# Patient Record
Sex: Male | Born: 1950
Health system: Southern US, Community
[De-identification: ages and names within clinical notes are randomized; demographics above are authoritative.]

## PROBLEM LIST (undated history)

## (undated) DIAGNOSIS — E039 Hypothyroidism, unspecified: Secondary | ICD-10-CM

## (undated) DIAGNOSIS — I472 Ventricular tachycardia, unspecified: Secondary | ICD-10-CM

## (undated) DIAGNOSIS — I255 Ischemic cardiomyopathy: Secondary | ICD-10-CM

## (undated) DIAGNOSIS — E785 Hyperlipidemia, unspecified: Secondary | ICD-10-CM

## (undated) DIAGNOSIS — D75839 Thrombocytosis, unspecified: Secondary | ICD-10-CM

## (undated) DIAGNOSIS — I10 Essential (primary) hypertension: Secondary | ICD-10-CM

## (undated) DIAGNOSIS — I714 Abdominal aortic aneurysm, without rupture, unspecified: Secondary | ICD-10-CM

## (undated) DIAGNOSIS — I5022 Chronic systolic (congestive) heart failure: Secondary | ICD-10-CM

## (undated) DIAGNOSIS — Z95810 Presence of automatic (implantable) cardiac defibrillator: Secondary | ICD-10-CM

## (undated) DIAGNOSIS — D473 Essential (hemorrhagic) thrombocythemia: Principal | ICD-10-CM

## (undated) DIAGNOSIS — Z7901 Long term (current) use of anticoagulants: Secondary | ICD-10-CM

## (undated) DIAGNOSIS — Z954 Presence of other heart-valve replacement: Secondary | ICD-10-CM

## (undated) DIAGNOSIS — Z950 Presence of cardiac pacemaker: Secondary | ICD-10-CM

## (undated) DIAGNOSIS — I2510 Atherosclerotic heart disease of native coronary artery without angina pectoris: Secondary | ICD-10-CM

## (undated) DIAGNOSIS — M79605 Pain in left leg: Principal | ICD-10-CM

## (undated) HISTORY — PX: KIDNEY SURGERY: SHX687

## (undated) HISTORY — DX: Pain in left leg: M79.605

## (undated) HISTORY — PX: TONSILLECTOMY: SUR1361

## (undated) HISTORY — PX: CHOLECYSTECTOMY: SHX55

## (undated) HISTORY — DX: Essential (primary) hypertension: I10

## (undated) HISTORY — PX: MECHANICAL AORTIC VALVE REPLACEMENT: SHX2013

## (undated) HISTORY — DX: Atherosclerotic heart disease of native coronary artery without angina pectoris: I25.10

## (undated) HISTORY — DX: Ventricular tachycardia: I47.2

## (undated) HISTORY — DX: Hyperlipidemia, unspecified: E78.5

## (undated) HISTORY — DX: Ventricular tachycardia, unspecified: I47.20

## (undated) HISTORY — DX: Essential (hemorrhagic) thrombocythemia: D47.3

## (undated) HISTORY — DX: Ischemic cardiomyopathy: I25.5

## (undated) HISTORY — DX: Long term (current) use of anticoagulants: Z79.01

## (undated) HISTORY — PX: BIV ICD INSERTION CRT-D: EP1195

## (undated) HISTORY — DX: Chronic systolic (congestive) heart failure: I50.22

## (undated) HISTORY — DX: Presence of other heart-valve replacement: Z95.4

---

## 1997-11-11 ENCOUNTER — Ambulatory Visit (HOSPITAL_COMMUNITY): Admission: RE | Admit: 1997-11-11 | Discharge: 1997-11-11 | Payer: Self-pay | Admitting: Neurosurgery

## 2015-05-30 HISTORY — PX: CORONARY ARTERY BYPASS GRAFT: SHX141

## 2016-06-02 HISTORY — PX: BIV ICD INSERTION CRT-D: EP1195

## 2017-01-23 ENCOUNTER — Encounter (HOSPITAL_COMMUNITY): Payer: Self-pay | Admitting: Internal Medicine

## 2017-01-23 ENCOUNTER — Ambulatory Visit (HOSPITAL_COMMUNITY)
Admission: RE | Admit: 2017-01-23 | Discharge: 2017-01-23 | Disposition: A | Discharge status: Home / Self Care | Payer: Medicare Other | Source: Ambulatory Visit | Attending: Internal Medicine | Admitting: Internal Medicine

## 2017-01-23 VITALS — BP 113/52 | HR 66 | Wt 173.4 lb

## 2017-01-23 DIAGNOSIS — I2510 Atherosclerotic heart disease of native coronary artery without angina pectoris: Secondary | ICD-10-CM | POA: Diagnosis not present

## 2017-01-23 DIAGNOSIS — Z950 Presence of cardiac pacemaker: Secondary | ICD-10-CM | POA: Insufficient documentation

## 2017-01-23 DIAGNOSIS — Z952 Presence of prosthetic heart valve: Secondary | ICD-10-CM

## 2017-01-23 DIAGNOSIS — I5022 Chronic systolic (congestive) heart failure: Secondary | ICD-10-CM | POA: Diagnosis not present

## 2017-01-23 DIAGNOSIS — R9431 Abnormal electrocardiogram [ECG] [EKG]: Secondary | ICD-10-CM | POA: Insufficient documentation

## 2017-01-23 DIAGNOSIS — I480 Paroxysmal atrial fibrillation: Secondary | ICD-10-CM

## 2017-01-23 LAB — BASIC METABOLIC PANEL
ANION GAP: 10 (ref 5–15)
BUN: 17 mg/dL (ref 6–20)
CHLORIDE: 102 mmol/L (ref 101–111)
CO2: 26 mmol/L (ref 22–32)
Calcium: 9.1 mg/dL (ref 8.9–10.3)
Creatinine, Ser: 1.22 mg/dL (ref 0.61–1.24)
GFR calc Af Amer: >60 mL/min (ref >60)
GFR, EST NON AFRICAN AMERICAN: 60 mL/min — AB (ref >60)
GLUCOSE: 101 mg/dL — AB (ref 65–99)
POTASSIUM: 3.5 mmol/L (ref 3.5–5.1)
Sodium: 138 mmol/L (ref 135–145)

## 2017-01-23 MED ORDER — SPIRONOLACTONE 25 MG PO TABS: 12.5000 mg | ORAL_TABLET | Freq: Every day | ORAL | 3 refills | Status: DC

## 2017-01-23 NOTE — Patient Instructions (Signed)
Start Spironolactone 12.5 mg (1/2 tab) daily  Labs today  Labs in 1 week in [place] have been referred to EP to follow up your device  You have been referred to Dr Beryle Beams  Your physician recommends that you schedule a follow-up appointment in: 4 months with echocardiogram

## 2017-01-23 NOTE — Progress Notes (Signed)
[facility] CLINIC NEW PATIENT NOTE  Primary Cardiologist: Martin Majestic   HPI:  Mr. Carreno is a very pleasant 66 y.o. male with AVR/MVR, CAD s/p CABG, systolic HF and essential thrombosis. Referred for new patient evaluation.   Has h/o congenital AoV abnormality with AI. In 1994 had MI and underwent PCI (unclear artery) and mechanical AVR at [facility].   Has h/o polysubstance abuse with cocaine, ETOH and tobacco abuse. No longer doing cocaine or smoking. Now drinks about 2 beers per day.  Did well from heart perspective until 2017. When he had VF arrest on 05/18/16. Found to have 3v CAD and MV disease and underwent CABG x 3 with mechanical MVR. Post-operatively had AF (treated with amio) and CHB requiring implantation of a BosSci CRT-D device.   Echo on 06/19/16 showed LVEF of 40%.   Since discharge from he hospital he has done well. Gets around ok. 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 The past week has been very fatigued. Also has been having diarrhea for 5 days. No fevers or chills. No ab pain. Started out bloody but no longer bleeding. Saw GI. Colonscopy September 9/4. Denis edema, orthopnea or PND. No CP.  Taking coumadin regularly. Not exercising regularly except for his work.  On hydroxyurea for thrombocytosis   Review of Systems: [y] = yes, [ ]  = no   General: Weight gain [ ] ; Weight loss [ ] ; Anorexia [ ] ; Fatigue Blue.Reese ]; Fever [ ] ; Chills [ ] ; Weakness [ ]   Cardiac: Chest pain/pressure [ ] ; Resting SOB [ ] ; Exertional SOB [ ] ; Orthopnea [ ] ; Pedal Edema [ ] ; Palpitations [ ] ; Syncope [ ] ; Presyncope [ ] ; Paroxysmal nocturnal dyspnea[ ]   Pulmonary: Cough [ ] ; Wheezing[ ] ; Hemoptysis[ ] ; Sputum [ ] ; Snoring [ ]   GI: Vomiting[ ] ; Dysphagia[ ] ; Melena[ ] ; Hematochezia [ ] ; Heartburn[ ] ; Abdominal pain [ ] ; Constipation [ ] ; Diarrhea Blue.Reese ]; BRBPR [ ]   GU: Hematuria[ ] ; Dysuria [ ] ; Nocturia[ ]   Vascular: Pain in legs with walking [ ] ; Pain in feet with lying flat [ ] ; Non-healing sores [ ] ;  Stroke [ ] ; TIA [ ] ; Slurred speech [ ] ;  Neuro: Headaches[ ] ; Vertigo[ ] ; Seizures[ ] ; Paresthesias[ ] ;Blurred vision [ ] ; Diplopia [ ] ; Vision changes [ ]   Ortho/Skin: Arthritis Blue.Reese ]; Joint pain [ y]; Muscle pain [ ] ; Joint swelling [ ] ; Back Pain [ ] ; Rash [ ]   Psych: Depression[ ] ; Anxiety[ ]   Heme: Bleeding problems [ ] ; Clotting disorders [ ] ; Anemia [ ]   Endocrine: Diabetes [ ] ; Thyroid dysfunction[ ]    PMHx: As per HPI  Current Outpatient Prescriptions  Medication Sig Dispense Refill  . aspirin 81 MG tablet Take 1 tablet by mouth.    Marland Kitchen atorvastatin (LIPITOR) 80 MG tablet Take 40 mg by mouth daily.    . carvedilol (COREG) 6.25 MG tablet Take 6.25 mg by mouth 2 (two) times daily as needed.    . hydroxyurea (HYDREA) 500 MG capsule Take 500 mg by mouth every other day.    . levothyroxine (SYNTHROID, LEVOTHROID) 25 MCG tablet Take 50 mcg by mouth daily.    Marland Kitchen losartan (COZAAR) 50 MG tablet 1 tablet daily.    . Melatonin 3 MG TABS Take 1 tablet by mouth at bedtime as needed.    . sildenafil (VIAGRA) 100 MG tablet Take 100 mg by mouth daily as needed for erectile dysfunction.    . torsemide (DEMADEX) 20 MG tablet Take 20 mg  by mouth daily.    Marland Kitchen warfarin (COUMADIN) 5 MG tablet Take 5 mg by mouth once.     No current facility-administered medications for this encounter.     Allergies  Allergen Reactions  . Zithromax [Azithromycin] Rash      Social History   Social History  . Marital status: Married    Spouse name: N/A  . Number of children: N/A  . Years of education: N/A   Occupational History  . Not on file.   Social History Main Topics  . Smoking status: Former Smoker    Packs/day: 1.00    Years: 30.00    Types: Cigarettes    Start date: 05/26/1979    Quit date: 05/18/2016  . Smokeless tobacco: Never Used  . Alcohol use Not on file  . Drug use: Unknown  . Sexual activity: Not on file   Other Topics Concern  . Not on file   Social History Narrative  . No  narrative on file   SocHX: . Smoking status: Former Smoker  Packs/day: 1.00  Years: 33.00  Types: Cigarettes  Quit date: 05/17/2016  . Smokeless tobacco: Never Used  . Alcohol use 8.4 oz/week  14 Cans of beer per week  Comment: 2 cans beer per day  . Drug use: No   FamHX:  Father died from CHF at 81 Mother alive no heart problems H/o colon CA on Moms side 1 sister who is fine   Vitals:   01/23/17 1112  BP: (!) 113/52  Pulse: 66  SpO2: 98%  Weight: 173 lb 6.4 oz (78.7 kg)    PHYSICAL EXAM: General:  Well appearing. No respiratory difficulty HEENT: normal Neck: supple. JVP 5-6. Carotids 2+ bilat; no bruits. No lymphadenopathy or thryomegaly appreciated. Cor: PMI nondisplaced. Mildly irregular  Mechanical s1 and s2 Lungs: clear with mildly decreased breath sounds throughout Abdomen: soft, nontender, nondistended. No hepatosplenomegaly. No bruits or masses. Good bowel sounds. Extremities: no cyanosis, clubbing, rash, edema Neuro: alert & oriented x 3, cranial nerves grossly intact. moves all 4 extremities w/o difficulty. Affect pleasant.  ECG: NSR with v-pacing 67 + PVC   ASSESSMENT & PLAN:  1. Chronic systolic HF - Echo 6/22: EF 40% c/b CHB. S/p BosSci CRT-D - NYHA I. Volume status ok  - Continue torsemide, losartan and carvedilol  - Add spiro 12.5mg  daily - Will repeat echo. If EF < 40% consider switching losartan to [facility] - Arrange local EP f/u  2. CAD s/p CABG - No s/s angina - Continue statin, b-blocker, ASA  3. S/p mechanical AVR/MVR - Continue ASA and coumadin - Reminded him of need for SBE prophylaxis  4. Post-op AF - Resolved. Off amio.  - Remains on warfarin for valves  5. Essential thrombocytosis - Continue hydroxyurea - Refer to heme  Glori Bickers, MD  9:29 PM

## 2017-01-24 ENCOUNTER — Telehealth (HOSPITAL_COMMUNITY): Payer: Self-pay | Admitting: *Deleted

## 2017-01-24 NOTE — Telephone Encounter (Signed)
Patient called wanting to know if it was ok to hold coumadin for 5 days for colonoscopy. Per Dr.Bensimhon he may hold coumadin for 5 days. Pt aware.

## 2017-02-02 ENCOUNTER — Telehealth (HOSPITAL_COMMUNITY): Payer: Self-pay | Admitting: Vascular Surgery

## 2017-02-02 ENCOUNTER — Ambulatory Visit (HOSPITAL_COMMUNITY)
Admission: RE | Admit: 2017-02-02 | Discharge: 2017-02-02 | Disposition: A | Discharge status: Home / Self Care | Payer: Medicare Other | Source: Ambulatory Visit | Attending: Internal Medicine | Admitting: Internal Medicine

## 2017-02-02 ENCOUNTER — Other Ambulatory Visit (HOSPITAL_COMMUNITY): Payer: Self-pay | Admitting: Pharmacist

## 2017-02-02 DIAGNOSIS — I5022 Chronic systolic (congestive) heart failure: Secondary | ICD-10-CM

## 2017-02-02 HISTORY — DX: Chronic systolic (congestive) heart failure: I50.22

## 2017-02-02 LAB — BASIC METABOLIC PANEL
ANION GAP: 8 (ref 5–15)
BUN: 14 mg/dL (ref 6–20)
CALCIUM: 9.2 mg/dL (ref 8.9–10.3)
CHLORIDE: 109 mmol/L (ref 101–111)
CO2: 24 mmol/L (ref 22–32)
CREATININE: 1.12 mg/dL (ref 0.61–1.24)
GFR calc non Af Amer: >60 mL/min (ref >60)
Glucose, Bld: 152 mg/dL — ABNORMAL HIGH (ref 65–99)
Potassium: 3.8 mmol/L (ref 3.5–5.1)
SODIUM: 141 mmol/L (ref 135–145)

## 2017-02-02 MED ORDER — WARFARIN SODIUM 5 MG PO TABS: 5.0000 mg | ORAL_TABLET | Freq: Every day | ORAL | 0 refills | Status: DC

## 2017-02-02 NOTE — Addendum Note (Signed)
Encounter addended by: Harvie Junior, CMA on: 02/02/2017  2:16 PM<BR>    Actions taken: Order list changed, Diagnosis association updated

## 2017-02-02 NOTE — Addendum Note (Signed)
Encounter addended by: Harvie Junior, CMA on: 02/02/2017  2:26 PM<BR>    Actions taken: Order list changed, Diagnosis association updated

## 2017-02-05 ENCOUNTER — Other Ambulatory Visit (HOSPITAL_COMMUNITY): Payer: Self-pay | Admitting: *Deleted

## 2017-02-05 ENCOUNTER — Ambulatory Visit (HOSPITAL_COMMUNITY)
Admission: RE | Admit: 2017-02-05 | Discharge: 2017-02-05 | Disposition: A | Discharge status: Home / Self Care | Payer: Medicare Other | Source: Ambulatory Visit | Attending: Cardiology | Admitting: Cardiology

## 2017-02-05 DIAGNOSIS — I5022 Chronic systolic (congestive) heart failure: Secondary | ICD-10-CM | POA: Insufficient documentation

## 2017-02-05 LAB — CBC
HCT: 33.7 % — ABNORMAL LOW (ref 39.0–52.0)
Hemoglobin: 11 g/dL — ABNORMAL LOW (ref 13.0–17.0)
MCH: 35.6 pg — ABNORMAL HIGH (ref 26.0–34.0)
MCHC: 32.6 g/dL (ref 30.0–36.0)
MCV: 109.1 fL — ABNORMAL HIGH (ref 78.0–100.0)
PLATELETS: 387 10*3/uL (ref 150–400)
RBC: 3.09 MIL/uL — ABNORMAL LOW (ref 4.22–5.81)
RDW: 16 % — AB (ref 11.5–15.5)
WBC: 4.3 10*3/uL (ref 4.0–10.5)

## 2017-02-08 ENCOUNTER — Encounter: Payer: Self-pay | Admitting: Internal Medicine

## 2017-02-08 ENCOUNTER — Ambulatory Visit (INDEPENDENT_AMBULATORY_CARE_PROVIDER_SITE_OTHER): Payer: Medicare Other | Admitting: Internal Medicine

## 2017-02-08 VITALS — BP 140/70 | HR 67 | Ht 69.0 in | Wt 176.0 lb

## 2017-02-08 DIAGNOSIS — Z95810 Presence of automatic (implantable) cardiac defibrillator: Secondary | ICD-10-CM | POA: Diagnosis not present

## 2017-02-08 DIAGNOSIS — I5022 Chronic systolic (congestive) heart failure: Secondary | ICD-10-CM

## 2017-02-08 DIAGNOSIS — I480 Paroxysmal atrial fibrillation: Secondary | ICD-10-CM

## 2017-02-08 DIAGNOSIS — I2510 Atherosclerotic heart disease of native coronary artery without angina pectoris: Secondary | ICD-10-CM

## 2017-02-08 NOTE — Patient Instructions (Addendum)
Medication Instructions:  Your physician recommends that you continue on your current medications as directed. Please refer to the Current Medication list given to you today.   Labwork: None ordered  Testing/Procedures: None ordered  Follow-Up: Remote monitoring is used to monitor your ICD from home. This monitoring reduces the number of office visits required to check your device to one time per year. It allows [place] to keep an eye on the functioning of your device to ensure it is working properly. You are scheduled for a device check from home on 05/10/17. You may send your transmission at any time that day. If you have a wireless device, the transmission will be sent automatically. After your physician reviews your transmission, you will receive a postcard with your next transmission date.  Your physician wants you to follow-up in: 1 year with Dr. Caryl Comes. You will receive a reminder letter in the mail two months in advance. If you don't receive a letter, please call our office to schedule the follow-up appointment.   Any Other Special Instructions Will Be Listed Below (If Applicable).     If you need a refill on your cardiac medications before your next appointment, please call your pharmacy.

## 2017-02-08 NOTE — Progress Notes (Signed)
ELECTROPHYSIOLOGY CONSULT NOTE  Patient ID: Taylor [name], MRN: [id number], DOB/AGE: 67-Jul-1952 67 y.o. Admit date: (Not on file) Date of Consult: 02/08/2017  Primary Physician: Patient, No Pcp Per Primary Cardiologist: Taylor [name] is a 66 y.o. male who is being seen today for the evaluation of ICD at the request of Taylor [name].    HPI Taylor [name] is a 66 y.o. male seen to establish ICD care. He has a history of VF arrest 12/17.  He underwent CABG and aortic valve rereplacement.hx of a prior mechanical aortic valve, not unsurprisingly he ended up with postoperative heart block and underwent CRT-D implantation.  He has a history of congenital aortic malformation with aortic insufficiency.  His a history of ischemic cardiomyopathy with prior MI and prior PCI.  DATE TEST    1/18    Echo   EF 40 %                Date Cr Hgb  1/18 1.4 10.1  9/18 1.12 11              Past Medical History:  Diagnosis Date  . Atherosclerotic heart disease of native coronary artery without angina pectoris   . Chronic systolic heart failure ([place]) 02/02/2017  . Hyperlipidemia   . Hypertension   . Ischemic cardiomyopathy   . Presence of other heart-valve replacement   . Ventricular tachycardia [facility])       Surgical History: History reviewed. No pertinent surgical history.   Home Meds: Prior to Admission medications   Medication Sig Start Date End Date Taking? Authorizing Provider  aspirin 81 MG tablet Take 1 tablet by mouth.   Yes [provider]  atorvastatin (LIPITOR) 80 MG tablet Take 40 mg by mouth daily. 06/03/16 06/03/17 Yes [provider]  carvedilol (COREG) 6.25 MG tablet Take 6.25 mg by mouth 2 (two) times daily as needed.   Yes [provider]  hydroxyurea (HYDREA) 500 MG capsule Take 500 mg by mouth every other day. Alternating with 1000 mg by mouth every other day   Yes [provider]  levothyroxine (SYNTHROID, LEVOTHROID) 25 MCG tablet Take  50 mcg by mouth daily. 01/10/16  Yes [provider]  spironolactone (ALDACTONE) 25 MG tablet Take 0.5 tablets (12.5 mg total) by mouth daily. 01/23/17 04/23/17 Yes [name], Taylor Pascal, MD  triazolam (HALCION) 0.25 MG tablet Take 1 tablet by mouth at bedtime. 01/23/17  Yes [provider]  warfarin (COUMADIN) 5 MG tablet Take 1 tablet (5 mg total) by mouth daily. 02/02/17  Yes [name], Taylor Pascal, MD    Allergies:  Allergies  Allergen Reactions  . Zithromax [Azithromycin] Rash    Social History   Social History  . Marital status: Married    Spouse name: N/A  . Number of children: N/A  . Years of education: N/A   Occupational History  . Not on file.   Social History Main Topics  . Smoking status: Former Smoker    Packs/day: 1.00    Years: 30.00    Types: Cigarettes    Start date: 05/26/1979    Quit date: 05/18/2016  . Smokeless tobacco: Never Used  . Alcohol use Not on file     Comment: 2 cans a day..  . Drug use: No  . Sexual activity: Not on file   Other Topics Concern  . Not on file   Social History Narrative  . No narrative on file  History reviewed. No pertinent family history.   ROS:  Please see the history of present illness.     All other systems reviewed and negative.    Physical Exam: Blood pressure 140/70, pulse 67, height 5\' 9"  (1.753 m), weight 176 lb (79.8 kg), SpO2 96 %. General: Well developed, well nourished male in no acute distress. Head: Normocephalic, atraumatic, sclera non-icteric, no xanthomas, nares are without discharge. EENT: normal  Lymph Nodes:  none Neck: Negative for carotid bruits. JVD not elevated. Back:without scoliosis kyphosis Lungs: Clear bilaterally to auscultation without wheezes, rales, or rhonchi. Breathing is unlabored. Heart: RRR with S1 [id number]   2/6 systolic ejection murmur . No rubs, or gallops appreciated. Abdomen: Soft, non-tender, non-distended with normoactive bowel sounds. No hepatomegaly.  No rebound/guarding. No obvious abdominal masses. Msk:  Strength and tone appear normal for age. Extremities: No clubbing or cyanosis. No  edema.  Distal pedal pulses are 2+ and equal bilaterally. Skin: Warm and Dry Neuro: Alert and oriented X 3. CN III-XII intact Grossly normal sensory and motor function . Psych:  Responds to questions appropriately with a normal affect.      Labs: Cardiac Enzymes No results for input(s): CKTOTAL, CKMB, TROPONINI in the last 72 hours. CBC Lab Results  Component Value Date   WBC 4.3 02/05/2017   HGB 11.0 (L) 02/05/2017   HCT 33.7 (L) 02/05/2017   MCV 109.1 (H) 02/05/2017   PLT 387 02/05/2017   PROTIME: No results for input(s): LABPROT, INR in the last 72 hours. Chemistry  Recent Labs Lab 02/02/17 1417  NA 141  K 3.8  CL 109  CO2 24  BUN 14  CREATININE 1.12  CALCIUM 9.2  GLUCOSE 152*   Lipids No results found for: CHOL, HDL, LDLCALC, TRIG BNP No results found for: PROBNP Thyroid Function Tests: No results for input(s): TSH, T4TOTAL, T3FREE, THYROIDAB in the last 72 hours.  Invalid input(s): FREET3 Miscellaneous No results found for: DDIMER  Radiology/Studies:  No results found.  EKG: Sinus rhythm with PVCs synchronous pacing with biventricular configuration with frequent PVCs   Assessment and Plan:  Ischemic/valvular cardiomyopathy  Cardiac arrest-aborted  Complete heart block  PVCs-frequent  Status post bypass surgery and aortic valve replacement (no mitral valve replacement was done)  CRT-D-[employer]  The patient has a CRT-D device functioning normally. 2 things are noteworthy. The first is frequent PVCs for greater than 13%. This has been stable over month post implant. This may be affecting his cardiomyopathy and will recommend antiarrhythmic suppression reassessment of LV function. He is scheduled to have an echocardiogram next 2 weeks to review results with Dr. Reine Just prior to making a decision.  He also has  a significant number of intrinsic sensed beats at higher heart rates and his device is programmed dynamic AV delay off. We will activate it  Device was further reprogrammed to lengthen detections. We changed the VT1 from 140---170.   Taylor [name]

## 2017-02-09 ENCOUNTER — Telehealth: Payer: Self-pay | Admitting: Pharmacist

## 2017-02-09 NOTE — Telephone Encounter (Signed)
LMOM; patient to call back and schedule coumadin clinic visit. Warfarin 5mg  po daily started on 02/02/2017. No INR check noted and no future appointment either.

## 2017-02-13 NOTE — Telephone Encounter (Signed)
Called pt - new Coumadin appt scheduled for 9/20 per pt availability.

## 2017-02-15 ENCOUNTER — Ambulatory Visit (INDEPENDENT_AMBULATORY_CARE_PROVIDER_SITE_OTHER): Payer: Medicare Other | Admitting: *Deleted

## 2017-02-15 ENCOUNTER — Other Ambulatory Visit: Payer: Self-pay | Admitting: Oncology

## 2017-02-15 ENCOUNTER — Encounter: Payer: Self-pay | Admitting: Oncology

## 2017-02-15 ENCOUNTER — Ambulatory Visit (INDEPENDENT_AMBULATORY_CARE_PROVIDER_SITE_OTHER): Payer: Medicare Other | Admitting: Oncology

## 2017-02-15 VITALS — BP 144/65 | HR 68 | Temp 98.0°F | Ht 69.0 in | Wt 176.5 lb

## 2017-02-15 DIAGNOSIS — Z5181 Encounter for therapeutic drug level monitoring: Secondary | ICD-10-CM | POA: Diagnosis not present

## 2017-02-15 DIAGNOSIS — Z881 Allergy status to other antibiotic agents status: Secondary | ICD-10-CM

## 2017-02-15 DIAGNOSIS — Z85828 Personal history of other malignant neoplasm of skin: Secondary | ICD-10-CM

## 2017-02-15 DIAGNOSIS — E039 Hypothyroidism, unspecified: Secondary | ICD-10-CM | POA: Diagnosis not present

## 2017-02-15 DIAGNOSIS — Z79890 Hormone replacement therapy (postmenopausal): Secondary | ICD-10-CM | POA: Diagnosis not present

## 2017-02-15 DIAGNOSIS — Z95810 Presence of automatic (implantable) cardiac defibrillator: Secondary | ICD-10-CM | POA: Diagnosis not present

## 2017-02-15 DIAGNOSIS — Z952 Presence of prosthetic heart valve: Secondary | ICD-10-CM | POA: Insufficient documentation

## 2017-02-15 DIAGNOSIS — I2510 Atherosclerotic heart disease of native coronary artery without angina pectoris: Secondary | ICD-10-CM | POA: Diagnosis not present

## 2017-02-15 DIAGNOSIS — F17211 Nicotine dependence, cigarettes, in remission: Secondary | ICD-10-CM | POA: Diagnosis not present

## 2017-02-15 DIAGNOSIS — I5022 Chronic systolic (congestive) heart failure: Secondary | ICD-10-CM | POA: Diagnosis not present

## 2017-02-15 DIAGNOSIS — Z79899 Other long term (current) drug therapy: Secondary | ICD-10-CM | POA: Diagnosis not present

## 2017-02-15 DIAGNOSIS — Z7901 Long term (current) use of anticoagulants: Secondary | ICD-10-CM | POA: Diagnosis not present

## 2017-02-15 DIAGNOSIS — F1911 Other psychoactive substance abuse, in remission: Secondary | ICD-10-CM | POA: Diagnosis not present

## 2017-02-15 DIAGNOSIS — I252 Old myocardial infarction: Secondary | ICD-10-CM

## 2017-02-15 DIAGNOSIS — Z951 Presence of aortocoronary bypass graft: Secondary | ICD-10-CM

## 2017-02-15 DIAGNOSIS — D473 Essential (hemorrhagic) thrombocythemia: Secondary | ICD-10-CM

## 2017-02-15 DIAGNOSIS — F1411 Cocaine abuse, in remission: Secondary | ICD-10-CM

## 2017-02-15 DIAGNOSIS — Z87448 Personal history of other diseases of urinary system: Secondary | ICD-10-CM

## 2017-02-15 DIAGNOSIS — Z7982 Long term (current) use of aspirin: Secondary | ICD-10-CM | POA: Diagnosis not present

## 2017-02-15 DIAGNOSIS — Z86010 Personal history of colonic polyps: Secondary | ICD-10-CM

## 2017-02-15 DIAGNOSIS — Z8679 Personal history of other diseases of the circulatory system: Secondary | ICD-10-CM

## 2017-02-15 DIAGNOSIS — I480 Paroxysmal atrial fibrillation: Secondary | ICD-10-CM

## 2017-02-15 HISTORY — DX: Essential (hemorrhagic) thrombocythemia: D47.3

## 2017-02-15 LAB — CBC WITH DIFFERENTIAL/PLATELET
BASOS ABS: 0 10*3/uL (ref 0.0–0.1)
BASOS PCT: 1 %
EOS ABS: 0.1 10*3/uL (ref 0.0–0.7)
Eosinophils Relative: 2 %
HCT: 34.7 % — ABNORMAL LOW (ref 39.0–52.0)
HEMOGLOBIN: 11.7 g/dL — AB (ref 13.0–17.0)
LYMPHS ABS: 0.9 10*3/uL (ref 0.7–4.0)
Lymphocytes Relative: 22 %
MCH: 37.4 pg — ABNORMAL HIGH (ref 26.0–34.0)
MCHC: 33.7 g/dL (ref 30.0–36.0)
MCV: 110.9 fL — ABNORMAL HIGH (ref 78.0–100.0)
MONO ABS: 0.2 10*3/uL (ref 0.1–1.0)
Monocytes Relative: 6 %
NEUTROS ABS: 2.7 10*3/uL (ref 1.7–7.7)
Neutrophils Relative %: 69 %
Platelets: 426 10*3/uL — ABNORMAL HIGH (ref 150–400)
RBC: 3.13 MIL/uL — ABNORMAL LOW (ref 4.22–5.81)
RDW: 16.4 % — AB (ref 11.5–15.5)
WBC: 3.9 10*3/uL — ABNORMAL LOW (ref 4.0–10.5)

## 2017-02-15 LAB — COMPREHENSIVE METABOLIC PANEL
ALK PHOS: 84 U/L (ref 38–126)
ALT: 24 U/L (ref 17–63)
ANION GAP: 8 (ref 5–15)
AST: 27 U/L (ref 15–41)
Albumin: 3.7 g/dL (ref 3.5–5.0)
BUN: 15 mg/dL (ref 6–20)
CALCIUM: 9 mg/dL (ref 8.9–10.3)
CO2: 24 mmol/L (ref 22–32)
Chloride: 105 mmol/L (ref 101–111)
Creatinine, Ser: 1.13 mg/dL (ref 0.61–1.24)
GFR calc Af Amer: >60 mL/min (ref >60)
GFR calc non Af Amer: >60 mL/min (ref >60)
Glucose, Bld: 128 mg/dL — ABNORMAL HIGH (ref 65–99)
POTASSIUM: 3.6 mmol/L (ref 3.5–5.1)
SODIUM: 137 mmol/L (ref 135–145)
TOTAL PROTEIN: 6.9 g/dL (ref 6.5–8.1)
Total Bilirubin: 1.1 mg/dL (ref 0.3–1.2)

## 2017-02-15 LAB — LACTATE DEHYDROGENASE: LDH: 254 U/L — ABNORMAL HIGH (ref 98–192)

## 2017-02-15 LAB — SAVE SMEAR

## 2017-02-15 LAB — POCT INR: INR: 3.8

## 2017-02-15 LAB — URIC ACID: URIC ACID, SERUM: 5.8 mg/dL (ref 4.4–7.6)

## 2017-02-15 MED ORDER — WARFARIN SODIUM 5 MG PO TABS: ORAL_TABLET | ORAL | 0 refills | Status: DC

## 2017-02-15 NOTE — Patient Instructions (Signed)
To lab today CBC every 2 months MD visit 6 months - lab same day
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
## 2017-02-15 NOTE — Patient Instructions (Signed)

## 2017-02-15 NOTE — Addendum Note (Signed)
Addended by: Orson Gear on: 02/15/2017 03:13 PM   Modules accepted: Orders

## 2017-02-16 ENCOUNTER — Telehealth: Payer: Self-pay | Admitting: *Deleted

## 2017-02-16 NOTE — Telephone Encounter (Signed)
Pt called / informed "blood count stable - stay on current dose of hydrea. Platelet count 426,000 & this is OK " per Dr Erie Noe. Pt voiced understanding.

## 2017-02-16 NOTE — Telephone Encounter (Signed)
-----   Message from Annia Belt, MD sent at 02/15/2017  4:59 PM EDT ----- Call pt: blood count stable - stay on current dose of hydrea. Platelet count 426,000 & this is OK

## 2017-02-23 LAB — CUP PACEART INCLINIC DEVICE CHECK
Implantable Lead Implant Date: 20180105
Implantable Lead Location: 753860
Implantable Lead Model: 4672
Implantable Lead Serial Number: 849519
MDC IDC LEAD IMPLANT DT: 20180105
MDC IDC LEAD IMPLANT DT: 20180105
MDC IDC LEAD LOCATION: 753858
MDC IDC LEAD LOCATION: 753859
MDC IDC LEAD SERIAL: 428973
MDC IDC LEAD SERIAL: 801088
MDC IDC PG IMPLANT DT: 20180105
MDC IDC PG SERIAL: 180139
MDC IDC SESS DTM: 20180928154411
MDC IDC STAT BRADY RA PERCENT PACED: 19 %
MDC IDC STAT BRADY RV PERCENT PACED: 87 %

## 2017-03-01 ENCOUNTER — Ambulatory Visit (INDEPENDENT_AMBULATORY_CARE_PROVIDER_SITE_OTHER): Payer: Medicare Other | Admitting: *Deleted

## 2017-03-01 DIAGNOSIS — I5022 Chronic systolic (congestive) heart failure: Secondary | ICD-10-CM | POA: Diagnosis not present

## 2017-03-01 DIAGNOSIS — Z952 Presence of prosthetic heart valve: Secondary | ICD-10-CM | POA: Diagnosis not present

## 2017-03-01 DIAGNOSIS — Z5181 Encounter for therapeutic drug level monitoring: Secondary | ICD-10-CM | POA: Diagnosis not present

## 2017-03-01 LAB — POCT INR: INR: 3.8

## 2017-03-15 ENCOUNTER — Ambulatory Visit (INDEPENDENT_AMBULATORY_CARE_PROVIDER_SITE_OTHER): Payer: Medicare Other | Admitting: Pharmacist

## 2017-03-15 DIAGNOSIS — I5022 Chronic systolic (congestive) heart failure: Secondary | ICD-10-CM

## 2017-03-15 DIAGNOSIS — Z5181 Encounter for therapeutic drug level monitoring: Secondary | ICD-10-CM

## 2017-03-15 DIAGNOSIS — Z952 Presence of prosthetic heart valve: Secondary | ICD-10-CM | POA: Diagnosis not present

## 2017-03-15 LAB — POCT INR: INR: 2.4

## 2017-03-20 NOTE — Telephone Encounter (Signed)
Encounter open in error 

## 2017-04-05 ENCOUNTER — Encounter (INDEPENDENT_AMBULATORY_CARE_PROVIDER_SITE_OTHER): Payer: Self-pay

## 2017-04-05 ENCOUNTER — Ambulatory Visit (INDEPENDENT_AMBULATORY_CARE_PROVIDER_SITE_OTHER): Payer: Medicare Other | Admitting: *Deleted

## 2017-04-05 DIAGNOSIS — Z952 Presence of prosthetic heart valve: Secondary | ICD-10-CM | POA: Diagnosis not present

## 2017-04-05 DIAGNOSIS — Z5181 Encounter for therapeutic drug level monitoring: Secondary | ICD-10-CM | POA: Diagnosis not present

## 2017-04-05 DIAGNOSIS — I5022 Chronic systolic (congestive) heart failure: Secondary | ICD-10-CM

## 2017-04-05 LAB — POCT INR: INR: 2.3

## 2017-04-17 ENCOUNTER — Other Ambulatory Visit: Payer: Self-pay | Admitting: *Deleted

## 2017-04-17 ENCOUNTER — Encounter: Payer: Self-pay | Admitting: Oncology

## 2017-04-17 ENCOUNTER — Telehealth: Payer: Self-pay | Admitting: *Deleted

## 2017-04-17 ENCOUNTER — Other Ambulatory Visit: Payer: Self-pay

## 2017-04-17 ENCOUNTER — Other Ambulatory Visit (INDEPENDENT_AMBULATORY_CARE_PROVIDER_SITE_OTHER): Payer: Medicare Other

## 2017-04-17 ENCOUNTER — Ambulatory Visit (INDEPENDENT_AMBULATORY_CARE_PROVIDER_SITE_OTHER): Payer: Medicare Other | Admitting: Oncology

## 2017-04-17 VITALS — BP 137/76 | HR 62 | Temp 97.7°F | Ht 69.0 in | Wt 169.9 lb

## 2017-04-17 DIAGNOSIS — Z95810 Presence of automatic (implantable) cardiac defibrillator: Secondary | ICD-10-CM | POA: Diagnosis not present

## 2017-04-17 DIAGNOSIS — M5432 Sciatica, left side: Secondary | ICD-10-CM | POA: Diagnosis present

## 2017-04-17 DIAGNOSIS — F17211 Nicotine dependence, cigarettes, in remission: Secondary | ICD-10-CM | POA: Diagnosis not present

## 2017-04-17 DIAGNOSIS — I255 Ischemic cardiomyopathy: Secondary | ICD-10-CM | POA: Diagnosis not present

## 2017-04-17 DIAGNOSIS — M79605 Pain in left leg: Secondary | ICD-10-CM | POA: Diagnosis not present

## 2017-04-17 DIAGNOSIS — Z7982 Long term (current) use of aspirin: Secondary | ICD-10-CM | POA: Diagnosis not present

## 2017-04-17 DIAGNOSIS — Z881 Allergy status to other antibiotic agents status: Secondary | ICD-10-CM | POA: Diagnosis not present

## 2017-04-17 DIAGNOSIS — Z7901 Long term (current) use of anticoagulants: Secondary | ICD-10-CM | POA: Diagnosis not present

## 2017-04-17 DIAGNOSIS — Z952 Presence of prosthetic heart valve: Secondary | ICD-10-CM

## 2017-04-17 DIAGNOSIS — D473 Essential (hemorrhagic) thrombocythemia: Secondary | ICD-10-CM | POA: Diagnosis not present

## 2017-04-17 DIAGNOSIS — Z79899 Other long term (current) drug therapy: Secondary | ICD-10-CM

## 2017-04-17 HISTORY — DX: Pain in left leg: M79.605

## 2017-04-17 LAB — BASIC METABOLIC PANEL
ANION GAP: 9 (ref 5–15)
BUN: 21 mg/dL — ABNORMAL HIGH (ref 6–20)
CHLORIDE: 103 mmol/L (ref 101–111)
CO2: 25 mmol/L (ref 22–32)
Calcium: 9.5 mg/dL (ref 8.9–10.3)
Creatinine, Ser: 1.15 mg/dL (ref 0.61–1.24)
GFR calc non Af Amer: >60 mL/min (ref >60)
Glucose, Bld: 139 mg/dL — ABNORMAL HIGH (ref 65–99)
Potassium: 4.1 mmol/L (ref 3.5–5.1)
Sodium: 137 mmol/L (ref 135–145)

## 2017-04-17 LAB — CBC WITH DIFFERENTIAL/PLATELET
BASOS PCT: 0 %
Basophils Absolute: 0 10*3/uL (ref 0.0–0.1)
EOS PCT: 1 %
Eosinophils Absolute: 0 10*3/uL (ref 0.0–0.7)
HEMATOCRIT: 39.8 % (ref 39.0–52.0)
HEMOGLOBIN: 13.8 g/dL (ref 13.0–17.0)
LYMPHS PCT: 19 %
Lymphs Abs: 0.9 10*3/uL (ref 0.7–4.0)
MCH: 38.4 pg — AB (ref 26.0–34.0)
MCHC: 34.7 g/dL (ref 30.0–36.0)
MCV: 110.9 fL — AB (ref 78.0–100.0)
MONOS PCT: 7 %
Monocytes Absolute: 0.3 10*3/uL (ref 0.1–1.0)
NEUTROS PCT: 73 %
Neutro Abs: 3.3 10*3/uL (ref 1.7–7.7)
Platelets: 388 10*3/uL (ref 150–400)
RBC: 3.59 MIL/uL — ABNORMAL LOW (ref 4.22–5.81)
RDW: 14.1 % (ref 11.5–15.5)
WBC: 4.5 10*3/uL (ref 4.0–10.5)

## 2017-04-17 LAB — PROTIME-INR
INR: 1.4
Prothrombin Time: 17.1 seconds — ABNORMAL HIGH (ref 11.4–15.2)

## 2017-04-17 MED ORDER — TRAMADOL HCL 50 MG PO TABS: 50.0000 mg | ORAL_TABLET | Freq: Four times a day (QID) | ORAL | 0 refills | Status: DC | PRN

## 2017-04-17 NOTE — Telephone Encounter (Signed)
Pt's here for labs. States having lower back pain radiating down left leg and having difficulty walking x 2 days. Uses a TENS unit at home. Also having troulble sleeping.   Talked to Dr Beryle Beams - add PT/INR and run all labs for today stat and he will see pt. Pt and lab informed.

## 2017-04-17 NOTE — Progress Notes (Signed)
Hematology and Oncology Follow Up Visit  Taylor [name] [id number] 09-28-1950 66 y.o. 04/17/2017 6:50 PM   Principle Diagnosis: Encounter Diagnoses  Name Primary?  . Leg pain, central, left Yes  . Essential thrombocythemia [facility])      Interim History:  Walk-in visit.  Lavada Mesi I just saw her recently for the first time in September when he moved to [facility] and needed a hematologist for ongoing management of essential thrombocythemia on hydroxyurea treatment. He has a number of cardiac issues.  He has an ischemic cardiomyopathy.  Status post ICD/pacemaker placement.  He is status post a mechanical aortic valve replacement and is on chronic warfarin anticoagulation. He came in for routine labs today and complained to the lab staff that he was having severe pain in his left leg.  I was concerned with possibility of retroperitoneal hemorrhage.  I order the appropriate labs and have them put the patient in her room for an exam.  As it turns out, he has a history of sciatica which first started about 2 years ago and current symptoms are similar.  He saw a chiropractor at that time and after number of manipulations his symptoms subsided.  Symptoms have now flared up over the last 2 weeks.  Significant pain in both the anterior and posterior left thigh.  He denies any paresthesias.  No loss of control of bladder or bowel function.  No focal weakness although ambulation is limited by pain.  Pain does radiate in a backwards kind of fashion from the leg to the left hip but not significantly involving the spine.  He had a TENS unit from previous flareup which she used with partial relief of his symptoms.  Tylenol not helping.  He took a few tramadol that belonged to his mother also with minimal relief.  Medications: reviewed  Allergies:  Allergies  Allergen Reactions  . Zithromax [Azithromycin] Rash    Review of Systems: See interim history Remaining ROS negative:   Physical Exam: Blood  pressure 137/76, pulse 62, temperature 97.7 F (36.5 C), temperature source Oral, height 5\' 9"  (1.753 m), weight 169 lb 14.4 oz (77.1 kg), SpO2 98 %. Wt Readings from Last 3 Encounters:  04/17/17 169 lb 14.4 oz (77.1 kg)  02/15/17 176 lb 8 oz (80.1 kg)  02/08/17 176 lb (79.8 kg)     General appearance: Well-nourished Caucasian man HENNT: Pharynx no erythema, exudate, mass, or ulcer. No thyromegaly or thyroid nodules Lymph nodes: No cervical, supraclavicular, or axillary lymphadenopathy Breasts: Lungs: Clear to auscultation, resonant to percussion throughout Heart: Regular rhythm, no murmur, mechanical heart valve sounds Abdomen: Soft, nontender, normal bowel sounds, no mass, no organomegaly Extremities: No edema, no calf tenderness Musculoskeletal: no joint deformities.  No pain on rotation at the left hip. GU: No flank tenderness or ecchymosis. Vascular: Carotid pulses 2+, no bruits, femoral pulses 2+ symmetric, distal pulses: Dorsalis pedis 2+ symmetric, posterior tibial pulses 1+ on the left, no cyanosis Neurologic: Alert, oriented, PERRLA, optic discs not visualized.  No hemorrhage or exudate, cranial nerves grossly normal, motor strength 5 over 5, reflexes 1+ symmetric at the biceps, 2+ at the right knee, 1+ at the left knee, upper body coordination normal, gait normal, Brudzinski sign negative. Skin: No rash or ecchymosis  Lab Results: CBC W/Diff    Component Value Date/Time   WBC 4.5 04/17/2017 1533   RBC 3.59 (L) 04/17/2017 1533   HGB 13.8 04/17/2017 1533   HCT 39.8 04/17/2017 1533   PLT 388 04/17/2017 1533  MCV 110.9 (H) 04/17/2017 1533   MCH 38.4 (H) 04/17/2017 1533   MCHC 34.7 04/17/2017 1533   RDW 14.1 04/17/2017 1533   LYMPHSABS 0.9 04/17/2017 1533   MONOABS 0.3 04/17/2017 1533   EOSABS 0.0 04/17/2017 1533   BASOSABS 0.0 04/17/2017 1533     Chemistry      Component Value Date/Time   NA 137 04/17/2017 1533   K 4.1 04/17/2017 1533   CL 103 04/17/2017 1533    CO2 25 04/17/2017 1533   BUN 21 (H) 04/17/2017 1533   CREATININE 1.15 04/17/2017 1533      Component Value Date/Time   CALCIUM 9.5 04/17/2017 1533   ALKPHOS 84 02/15/2017 1523   AST 27 02/15/2017 1523   ALT 24 02/15/2017 1523   BILITOT 1.1 02/15/2017 1523    INR 1.4   Radiological Studies: No results found.  Impression:  1.  Flareup of sciatica. No gross focal neurologic deficit.  Slightly depressed left patellar reflex.  Questionable 4+/5 weakness in extension left foot. I advised bed rest as much as possible.  I gave him a small prescription for tramadol 50 mg every 6 hours #30 no refill.  He cannot take nonsteroidals on the warfarin. I am going to schedule a CT with contrast to exclude a radiculopathy.  He cannot have an MRI because of his mechanical heart valve and pacemaker. He is advised to go to the emergency department if he has worsening pain before we can get the CT scan authorized.  2.  Subtherapeutic warfarin in a man with a mechanical aortic valve.  Current warfarin dose 5 mg Mondays and Fridays, 2.5 mg other days of the week.  He states that he has not missed any doses.  INR recorded on November 8 was 2.3.  His started INR should be 2.5-3.5.  I instructed him to increase his warfarin to 5 mg daily.  Recheck pro time at the heart care Coumadin clinic on Monday but no later than that.  3.  Essential thrombocythemia Platelet count acceptable at 388,000 on current dose of Hydrea 500 mg alternating with 1000 mg daily.  CC: Patient Care Team: Michell Heinrich, DO as PCP - General (Family Medicine)   Murriel Hopper, MD, [place]  Hematology-Oncology/Internal Medicine     11/20/20186:50 PM

## 2017-04-17 NOTE — Addendum Note (Signed)
Addended by: Truddie Crumble on: 04/17/2017 04:11 PM   Modules accepted: Orders

## 2017-04-17 NOTE — Patient Instructions (Addendum)
CT of lumbar spine: we will get insurance approval then call you with appointment; we will call you with result. Tramadol 1 every 6 hours as needed for pain #30 You will need to see your primary care MD or go to emergency room if pain gets worse before we can get the scan  Increase coumadin to 5 mg daily until further instruction: coumadin level needs to be checked no later than Monday, November 26

## 2017-04-24 ENCOUNTER — Ambulatory Visit (INDEPENDENT_AMBULATORY_CARE_PROVIDER_SITE_OTHER): Payer: Medicare Other | Admitting: Pharmacist

## 2017-04-24 DIAGNOSIS — I5022 Chronic systolic (congestive) heart failure: Secondary | ICD-10-CM

## 2017-04-24 DIAGNOSIS — Z5181 Encounter for therapeutic drug level monitoring: Secondary | ICD-10-CM | POA: Diagnosis not present

## 2017-04-24 DIAGNOSIS — Z952 Presence of prosthetic heart valve: Secondary | ICD-10-CM

## 2017-04-24 LAB — POCT INR: INR: 1.9

## 2017-04-24 NOTE — Patient Instructions (Signed)
Take 1 tablet (5mg ) tonight and then begin taking 1 tablet (5mg ) daily except 1/2 tablet (2.5mg ) on Monday, Wednesday, Fridays.  Call us with any medication changes or concerns [phone].

## 2017-05-02 ENCOUNTER — Other Ambulatory Visit: Payer: Self-pay | Admitting: Oncology

## 2017-05-02 NOTE — Telephone Encounter (Signed)
Dr. Darnell Level also ordered a CT with contrast which has not been done as of yet

## 2017-05-02 NOTE — Telephone Encounter (Signed)
I will defer to Dr. Darnell Level on this as it has been only 2 weeks since his prescription and I am unsure from Dr. Synthia Innocent note if it was supposed to last him longer.

## 2017-05-03 NOTE — Telephone Encounter (Signed)
Per Epic, CT scheduled 12/10.

## 2017-05-07 ENCOUNTER — Ambulatory Visit (HOSPITAL_COMMUNITY): Payer: Medicare Other

## 2017-05-10 ENCOUNTER — Ambulatory Visit (INDEPENDENT_AMBULATORY_CARE_PROVIDER_SITE_OTHER): Payer: Medicare Other | Admitting: *Deleted

## 2017-05-10 DIAGNOSIS — I5022 Chronic systolic (congestive) heart failure: Secondary | ICD-10-CM

## 2017-05-10 NOTE — Progress Notes (Signed)
Remote ICD transmission.   

## 2017-05-15 ENCOUNTER — Encounter: Payer: Self-pay | Admitting: Cardiology

## 2017-05-17 ENCOUNTER — Encounter: Payer: Self-pay | Admitting: *Deleted

## 2017-05-17 ENCOUNTER — Ambulatory Visit (INDEPENDENT_AMBULATORY_CARE_PROVIDER_SITE_OTHER): Payer: BLUE CROSS/BLUE SHIELD | Admitting: *Deleted

## 2017-05-17 ENCOUNTER — Ambulatory Visit (HOSPITAL_COMMUNITY)
Admission: RE | Admit: 2017-05-17 | Discharge: 2017-05-17 | Disposition: A | Discharge status: Home / Self Care | Payer: Medicare Other | Source: Ambulatory Visit | Attending: Oncology | Admitting: Oncology

## 2017-05-17 DIAGNOSIS — Z952 Presence of prosthetic heart valve: Secondary | ICD-10-CM

## 2017-05-17 DIAGNOSIS — M479 Spondylosis, unspecified: Secondary | ICD-10-CM | POA: Diagnosis not present

## 2017-05-17 DIAGNOSIS — I5022 Chronic systolic (congestive) heart failure: Secondary | ICD-10-CM

## 2017-05-17 DIAGNOSIS — Z5181 Encounter for therapeutic drug level monitoring: Secondary | ICD-10-CM

## 2017-05-17 DIAGNOSIS — M79605 Pain in left leg: Secondary | ICD-10-CM

## 2017-05-17 DIAGNOSIS — I714 Abdominal aortic aneurysm, without rupture: Secondary | ICD-10-CM | POA: Insufficient documentation

## 2017-05-17 LAB — POCT INR: INR: 2.5

## 2017-05-17 MED ORDER — IOPAMIDOL (ISOVUE-300) INJECTION 61%
INTRAVENOUS | Status: AC
  Administered 2017-05-17: 75 mL
  Filled 2017-05-17: qty 75

## 2017-05-17 NOTE — Progress Notes (Signed)
Mr. Taylor [name] is at the [facility] office today for an appointment with the Coumadin Clinic. He has been using a TENS unit for sciatic pain for 3 weeks and realized that he should ask if it is ok to use with his ICD. 3 remote transmissions received 12/10, 12/13, 12/14, no episodes noted, normal device function. Mr. Taylor [name] places the patches behind both knees and near is sacrum. I have advised him to not place a patch above the point of his belly button. He verbalizes and is appreciative of my time.

## 2017-05-17 NOTE — Patient Instructions (Signed)
Description   Continue  taking 1 tablet (5mg ) daily except 1/2 tablet (2.5mg ) on Monday, Wednesday, Fridays.  Call us with any medication changes or concerns [phone]. Recheck INR in 4 weeks

## 2017-05-23 LAB — CUP PACEART REMOTE DEVICE CHECK
Battery Remaining Percentage: 100 %
Brady Statistic RA Percent Paced: 29 %
Date Time Interrogation Session: 20181213061100
HighPow Impedance: 68 Ohm
Implantable Lead Implant Date: 20180105
Implantable Lead Location: 753858
Implantable Lead Model: 292
Implantable Lead Serial Number: 428973
Lead Channel Impedance Value: 793 Ohm
Lead Channel Pacing Threshold Pulse Width: 0.4 ms
Lead Channel Pacing Threshold Pulse Width: 1 ms
Lead Channel Setting Pacing Amplitude: 2 V
Lead Channel Setting Pacing Amplitude: 2.2 V
Lead Channel Setting Sensing Sensitivity: 1 mV
MDC IDC LEAD IMPLANT DT: 20180105
MDC IDC LEAD IMPLANT DT: 20180105
MDC IDC LEAD LOCATION: 753859
MDC IDC LEAD LOCATION: 753860
MDC IDC LEAD SERIAL: 801088
MDC IDC LEAD SERIAL: 849519
MDC IDC MSMT BATTERY REMAINING LONGEVITY: 96 mo
MDC IDC MSMT LEADCHNL LV IMPEDANCE VALUE: 605 Ohm
MDC IDC MSMT LEADCHNL LV PACING THRESHOLD AMPLITUDE: 1 V
MDC IDC MSMT LEADCHNL RA PACING THRESHOLD AMPLITUDE: 0.7 V
MDC IDC MSMT LEADCHNL RV IMPEDANCE VALUE: 652 Ohm
MDC IDC MSMT LEADCHNL RV PACING THRESHOLD AMPLITUDE: 0.6 V
MDC IDC MSMT LEADCHNL RV PACING THRESHOLD PULSEWIDTH: 0.4 ms
MDC IDC PG IMPLANT DT: 20180105
MDC IDC PG SERIAL: 180139
MDC IDC SET LEADCHNL LV PACING PULSEWIDTH: 1 ms
MDC IDC SET LEADCHNL RV PACING AMPLITUDE: 2 V
MDC IDC SET LEADCHNL RV PACING PULSEWIDTH: 0.4 ms
MDC IDC SET LEADCHNL RV SENSING SENSITIVITY: 0.6 mV
MDC IDC STAT BRADY RV PERCENT PACED: 76 %

## 2017-05-24 ENCOUNTER — Ambulatory Visit (HOSPITAL_BASED_OUTPATIENT_CLINIC_OR_DEPARTMENT_OTHER)
Admission: RE | Admit: 2017-05-24 | Discharge: 2017-05-24 | Disposition: A | Discharge status: Home / Self Care | Payer: Medicare Other | Source: Ambulatory Visit | Attending: Internal Medicine | Admitting: Internal Medicine

## 2017-05-24 ENCOUNTER — Encounter (HOSPITAL_COMMUNITY): Payer: Self-pay | Admitting: Internal Medicine

## 2017-05-24 ENCOUNTER — Other Ambulatory Visit: Payer: Self-pay

## 2017-05-24 ENCOUNTER — Ambulatory Visit (HOSPITAL_COMMUNITY)
Admission: RE | Admit: 2017-05-24 | Discharge: 2017-05-24 | Disposition: A | Discharge status: Home / Self Care | Payer: Medicare Other | Source: Ambulatory Visit | Attending: Family Medicine | Admitting: Family Medicine

## 2017-05-24 VITALS — BP 141/63 | HR 62 | Wt 172.0 lb

## 2017-05-24 DIAGNOSIS — I255 Ischemic cardiomyopathy: Secondary | ICD-10-CM | POA: Diagnosis not present

## 2017-05-24 DIAGNOSIS — I110 Hypertensive heart disease with heart failure: Secondary | ICD-10-CM | POA: Insufficient documentation

## 2017-05-24 DIAGNOSIS — I371 Nonrheumatic pulmonary valve insufficiency: Secondary | ICD-10-CM | POA: Insufficient documentation

## 2017-05-24 DIAGNOSIS — I493 Ventricular premature depolarization: Secondary | ICD-10-CM | POA: Diagnosis not present

## 2017-05-24 DIAGNOSIS — I472 Ventricular tachycardia: Secondary | ICD-10-CM | POA: Insufficient documentation

## 2017-05-24 DIAGNOSIS — Z951 Presence of aortocoronary bypass graft: Secondary | ICD-10-CM | POA: Insufficient documentation

## 2017-05-24 DIAGNOSIS — Z79899 Other long term (current) drug therapy: Secondary | ICD-10-CM | POA: Diagnosis not present

## 2017-05-24 DIAGNOSIS — Z7901 Long term (current) use of anticoagulants: Secondary | ICD-10-CM | POA: Insufficient documentation

## 2017-05-24 DIAGNOSIS — E785 Hyperlipidemia, unspecified: Secondary | ICD-10-CM | POA: Diagnosis not present

## 2017-05-24 DIAGNOSIS — I2510 Atherosclerotic heart disease of native coronary artery without angina pectoris: Secondary | ICD-10-CM | POA: Insufficient documentation

## 2017-05-24 DIAGNOSIS — I5022 Chronic systolic (congestive) heart failure: Secondary | ICD-10-CM | POA: Diagnosis not present

## 2017-05-24 DIAGNOSIS — Z9889 Other specified postprocedural states: Secondary | ICD-10-CM | POA: Insufficient documentation

## 2017-05-24 DIAGNOSIS — D473 Essential (hemorrhagic) thrombocythemia: Secondary | ICD-10-CM | POA: Insufficient documentation

## 2017-05-24 DIAGNOSIS — I252 Old myocardial infarction: Secondary | ICD-10-CM | POA: Diagnosis not present

## 2017-05-24 DIAGNOSIS — Z87891 Personal history of nicotine dependence: Secondary | ICD-10-CM | POA: Insufficient documentation

## 2017-05-24 DIAGNOSIS — Z7982 Long term (current) use of aspirin: Secondary | ICD-10-CM | POA: Insufficient documentation

## 2017-05-24 DIAGNOSIS — Z952 Presence of prosthetic heart valve: Secondary | ICD-10-CM | POA: Insufficient documentation

## 2017-05-24 LAB — BASIC METABOLIC PANEL
Anion gap: 9 (ref 5–15)
BUN: 17 mg/dL (ref 6–20)
CALCIUM: 9.3 mg/dL (ref 8.9–10.3)
CHLORIDE: 103 mmol/L (ref 101–111)
CO2: 27 mmol/L (ref 22–32)
CREATININE: 1.55 mg/dL — AB (ref 0.61–1.24)
GFR calc non Af Amer: 45 mL/min — ABNORMAL LOW (ref >60)
GFR, EST AFRICAN AMERICAN: 52 mL/min — AB (ref >60)
GLUCOSE: 102 mg/dL — AB (ref 65–99)
Potassium: 4.3 mmol/L (ref 3.5–5.1)
Sodium: 139 mmol/L (ref 135–145)

## 2017-05-24 MED ORDER — SPIRONOLACTONE 25 MG PO TABS: 12.5000 mg | ORAL_TABLET | Freq: Every day | ORAL | 6 refills | Status: DC

## 2017-05-24 MED ORDER — AMIODARONE HCL 200 MG PO TABS: 200.0000 mg | ORAL_TABLET | Freq: Two times a day (BID) | ORAL | 3 refills | Status: DC

## 2017-05-24 MED ORDER — SACUBITRIL-VALSARTAN 24-26 MG PO TABS: 1.0000 | ORAL_TABLET | Freq: Two times a day (BID) | ORAL | 3 refills | Status: DC

## 2017-05-24 NOTE — Patient Instructions (Signed)
Start Amiodarone 200 mg Twice daily   Start Entresto 24/26 mg Twice daily   Labs today  Your physician recommends that you schedule a follow-up appointment in: 2 months

## 2017-05-24 NOTE — Progress Notes (Signed)
ADVANCED HF CLINIC NOTE  Primary Cardiologist: Martin Majestic  PCP: Dr. Marveen Reeks ([place], [place])  HPI:  Taylor [name] is a very pleasant 66 y.o. male with mechnical AVR/MVR, CAD s/p CABG, systolic HF and essential thrombosis.   Has h/o congenital AoV abnormality with AI. In 1994 had MI and underwent PCI (unclear artery) and mechanical AVR at [facility].   Has h/o polysubstance abuse with cocaine, ETOH and tobacco abuse. No longer doing cocaine or smoking. Now drinks about 2 beers per day.  Did well from heart perspective until 2017. When he had VF arrest on 05/18/16. Found to have 3v CAD and MV disease and underwent CABG x 3 with mechanical MVR. Post-operatively had AF (treated with amio) and CHB requiring implantation of a BosSci CRT-D device.   Echo on 06/19/16 showed LVEF of 40%.  Taylor [name] is here for HF follow up. He feels well. No orthopnea, PND, SOB, edema, or CP. No palpitations. He has been having trouble sleeping. Tried some of his mother's lorazepam but it didn't help. Good energy level. Marland Kitchen He drinks 5 bottles of water/day and drinks 2 beers/day. He has lost about 5 lbs in the last 2 months. No nausea or vomiting. He had a colonoscopy that showed diverticulosis (did not need antibiotics), 3 benign polyps, and internal hemorrhoids. 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 He does not weigh himself at home - scale battery is dead. Only other complaint is recent sciatica pain that has been relieved by TENS unit on lower back, but he recently read it could interfere with his pacemaker. No NSAIDS with back pains. He thought he was supposed to stop his losartan when the spiro was started. He saw Dr. Caryl Comes and found to have 13% PVCs.   Echo today EF 25-30% Personally reviewed  Review of Systems: [y] = yes, [ ]  = no   General: Weight gain [ ] ; Weight loss [ ] ; Anorexia [ ] ; Fatigue Blue.Reese ]; Fever [ ] ; Chills [ ] ; Weakness [ ]   Cardiac: Chest pain/pressure [ ] ; Resting SOB [ ] ;  Exertional SOB [ ] ; Orthopnea [ ] ; Pedal Edema [ ] ; Palpitations [ ] ; Syncope [ ] ; Presyncope [ ] ; Paroxysmal nocturnal dyspnea[ ]   Pulmonary: Cough [ ] ; Wheezing[ ] ; Hemoptysis[ ] ; Sputum [ ] ; Snoring [ ]   GI: Vomiting[ ] ; Dysphagia[ ] ; Melena[ ] ; Hematochezia [ ] ; Heartburn[ ] ; Abdominal pain [ ] ; Constipation [ ] ; Diarrhea Blue.Reese ]; BRBPR [ ]   GU: Hematuria[ ] ; Dysuria [ ] ; Nocturia[ ]   Vascular: Pain in legs with walking [ ] ; Pain in feet with lying flat [ ] ; Non-healing sores [ ] ; Stroke [ ] ; TIA [ ] ; Slurred speech [ ] ;  Neuro: Headaches[ ] ; Vertigo[ ] ; Seizures[ ] ; Paresthesias[ ] ;Blurred vision [ ] ; Diplopia [ ] ; Vision changes [ ]   Ortho/Skin: Arthritis Blue.Reese ]; Joint pain [ y]; Muscle pain [ ] ; Joint swelling [ ] ; Back Pain [ ] ; Rash [ ]   Psych: Depression[ ] ; Anxiety[ ]   Heme: Bleeding problems [ ] ; Clotting disorders [ ] ; Anemia [ ]   Endocrine: Diabetes [ ] ; Thyroid dysfunction[ ]    PMHx: As per HPI  Current Outpatient Medications  Medication Sig Dispense Refill  . aspirin 81 MG tablet Take 1 tablet by mouth.    Marland Kitchen atorvastatin (LIPITOR) 80 MG tablet Take 40 mg by mouth daily.    . carvedilol (COREG) 6.25 MG tablet Take 6.25 mg by mouth 2 (two) times daily as needed.    Marland Kitchen  hydroxyurea (HYDREA) 500 MG capsule Take 500 mg by mouth every other day. Alternating with 1000 mg by mouth every other day    . levothyroxine (SYNTHROID, LEVOTHROID) 25 MCG tablet Take 50 mcg by mouth daily.    Marland Kitchen spironolactone (ALDACTONE) 25 MG tablet Take 0.5 tablets (12.5 mg total) by mouth daily. 15 tablet 3  . traMADol (ULTRAM) 50 MG tablet TAKE 1 TABLET BY MOUTH EVERY 6 HOURS AS NEEDED 30 tablet 0  . triazolam (HALCION) 0.25 MG tablet Take 1 tablet by mouth at bedtime.  0  . warfarin (COUMADIN) 5 MG tablet Take as directed by coumadin clinic 30 tablet 0   No current facility-administered medications for this encounter.     Allergies  Allergen Reactions  . Zithromax [Azithromycin] Rash      Social  History   Socioeconomic History  . Marital status: Married    Spouse name: Not on file  . Number of children: Not on file  . Years of education: Not on file  . Highest education level: Not on file  Social Needs  . Financial resource strain: Not on file  . Food insecurity - worry: Not on file  . Food insecurity - inability: Not on file  . Transportation needs - medical: Not on file  . Transportation needs - non-medical: Not on file  Occupational History  . Not on file  Tobacco Use  . Smoking status: Former Smoker    Packs/day: 1.00    Years: 30.00    Pack years: 30.00    Types: Cigarettes    Start date: 05/26/1979    Last attempt to quit: 05/18/2016    Years since quitting: 1.0  . Smokeless tobacco: Never Used  Substance and Sexual Activity  . Alcohol use: Not on file    Comment: 2 cans a day..  . Drug use: No  . Sexual activity: Not on file  Other Topics Concern  . Not on file  Social History Narrative  . Not on file   SocHX: . Smoking status: Former Smoker  Packs/day: 1.00  Years: 33.00  Types: Cigarettes  Quit date: 05/17/2016  . Smokeless tobacco: Never Used  . Alcohol use 8.4 oz/week  14 Cans of beer per week  Comment: 2 cans beer per day  . Drug use: No   FamHX:  Father died from CHF at 89 Mother alive no heart problems H/o colon CA on Moms side 1 sister who is fine   Vitals:   05/24/17 1409  BP: (!) 141/63  Pulse: 62  SpO2: 100%  Weight: 172 lb (78 kg)    PHYSICAL EXAM: General: Well appearing. No resp difficulty. HEENT: Normal Neck: Supple. JVP 5-6. Carotids 2+ bilat; no bruits. No thyromegaly or nodule noted. Cor: PMI laterally displaced. RRR, No M/G/R noted, PVC's Lungs: CTAB, normal effort. Abdomen: Soft, non-tender, non-distended, no HSM. No bruits or masses. +BS  Extremities: No cyanosis, clubbing, or rash. R and LLE no edema.  Neuro: Alert & orientedx3, cranial nerves grossly intact. moves all 4 extremities w/o difficulty. Affect  pleasant    ASSESSMENT & PLAN:  1. Chronic systolic HF - Echo 1/19: EF 40% c/b CHB. S/p BosSci CRT-D. Echo today EF 25-30% (Personally reviewed) - NYHA I-II. Volume status ok. ICD interrogated personally in clinic. No VT/AF - Continue torsemide. - Continue spiro 12.5mg  daily - Stop losartan. Add entresto 24/26  - Continue carvedilol 6.25 bid  2. CAD s/p CABG - No s/s angina - Continue statin, b-blocker and  ASA (with mechanical valves will not stop ASA in presence of warfarin)  3. S/p mechanical AVR/MVR - Continue ASA and coumadin - Reminded him of need for SBE prophylaxis   4. Post-op AF - Resolved. - Remains on warfarin for valves  5. Frequent PVCs - ICD interrogation with 13% PVCs. Unclear if this is contributing to LV dyfunction - Start amio 200 bid and follow  6. Essential thrombocytosis - Continue hydroxyurea - Refer to heme  Taylor Shore, NP  2:31 PM   Patient seen and examined with the above-signed Advanced Practice Provider and/or Housestaff. I personally reviewed laboratory data, imaging studies and relevant notes. I independently examined the patient and formulated the important aspects of the plan. I have edited the note to reflect any of my changes or salient points. I have personally discussed the plan with the patient and/or family.  I have edited note above thoroughly with my thoughts. ICD interrogated personally. Echo reviewed with him personall. Add Entresto. Add amio for PVC suppression. Follow closely.   Glori Bickers, MD  11:09 PM

## 2017-05-25 ENCOUNTER — Telehealth (HOSPITAL_COMMUNITY): Payer: Self-pay | Admitting: *Deleted

## 2017-05-25 DIAGNOSIS — I5022 Chronic systolic (congestive) heart failure: Secondary | ICD-10-CM

## 2017-05-25 NOTE — Telephone Encounter (Signed)
Result Notes for Basic metabolic panel   Notes recorded by Darron Doom, RN on 05/25/2017 at 9:41 AM EST Patient called back and lab appointment scheduled, bmet ordered. ------  Notes recorded by Darron Doom, RN on 05/25/2017 at 9:30 AM EST Called but no answer and no VM . Will continue to try and reach patient. ------  Notes recorded by Scarlette Calico, RN on 05/24/2017 at 5:12 PM EST Per Dr Haroldine Laws repeat in 1 week

## 2017-06-01 ENCOUNTER — Ambulatory Visit (HOSPITAL_COMMUNITY)
Admission: RE | Admit: 2017-06-01 | Discharge: 2017-06-01 | Disposition: A | Discharge status: Home / Self Care | Payer: Medicare Other | Source: Ambulatory Visit | Attending: Internal Medicine | Admitting: Internal Medicine

## 2017-06-01 DIAGNOSIS — I5022 Chronic systolic (congestive) heart failure: Secondary | ICD-10-CM | POA: Insufficient documentation

## 2017-06-01 LAB — BASIC METABOLIC PANEL
Anion gap: 10 (ref 5–15)
BUN: 17 mg/dL (ref 6–20)
CHLORIDE: 105 mmol/L (ref 101–111)
CO2: 22 mmol/L (ref 22–32)
CREATININE: 1.14 mg/dL (ref 0.61–1.24)
Calcium: 9.2 mg/dL (ref 8.9–10.3)
GFR calc Af Amer: >60 mL/min (ref >60)
Glucose, Bld: 137 mg/dL — ABNORMAL HIGH (ref 65–99)
Potassium: 4.1 mmol/L (ref 3.5–5.1)
SODIUM: 137 mmol/L (ref 135–145)

## 2017-06-11 ENCOUNTER — Telehealth (HOSPITAL_COMMUNITY): Payer: Self-pay | Admitting: Pharmacist

## 2017-06-11 NOTE — Telephone Encounter (Signed)
Entresto PA approved by [employer] commercial through 05/28/38.   Ruta Hinds. Velva Harman, PharmD, BCPS, CPP Clinical Pharmacist Phone: [phone] 06/11/2017 2:48 PM

## 2017-06-12 ENCOUNTER — Other Ambulatory Visit (INDEPENDENT_AMBULATORY_CARE_PROVIDER_SITE_OTHER): Payer: Medicare Other

## 2017-06-12 DIAGNOSIS — Z7901 Long term (current) use of anticoagulants: Secondary | ICD-10-CM | POA: Diagnosis not present

## 2017-06-12 DIAGNOSIS — D473 Essential (hemorrhagic) thrombocythemia: Secondary | ICD-10-CM

## 2017-06-13 LAB — CBC WITH DIFFERENTIAL/PLATELET
BASOS: 0 %
Basophils Absolute: 0 10*3/uL (ref 0.0–0.2)
EOS (ABSOLUTE): 0.1 10*3/uL (ref 0.0–0.4)
EOS: 2 %
HEMATOCRIT: 40.9 % (ref 37.5–51.0)
HEMOGLOBIN: 13.9 g/dL (ref 13.0–17.7)
IMMATURE GRANS (ABS): 0 10*3/uL (ref 0.0–0.1)
Immature Granulocytes: 0 %
LYMPHS ABS: 0.8 10*3/uL (ref 0.7–3.1)
LYMPHS: 18 %
MCH: 36.9 pg — AB (ref 26.6–33.0)
MCHC: 34 g/dL (ref 31.5–35.7)
MCV: 109 fL — AB (ref 79–97)
MONOCYTES: 6 %
Monocytes Absolute: 0.3 10*3/uL (ref 0.1–0.9)
NEUTROS ABS: 3.3 10*3/uL (ref 1.4–7.0)
Neutrophils: 74 %
Platelets: 380 10*3/uL — ABNORMAL HIGH (ref 150–379)
RBC: 3.77 x10E6/uL — ABNORMAL LOW (ref 4.14–5.80)
RDW: 16 % — ABNORMAL HIGH (ref 12.3–15.4)
WBC: 4.5 10*3/uL (ref 3.4–10.8)

## 2017-06-14 ENCOUNTER — Ambulatory Visit (INDEPENDENT_AMBULATORY_CARE_PROVIDER_SITE_OTHER): Payer: Medicare Other | Admitting: *Deleted

## 2017-06-14 ENCOUNTER — Telehealth: Payer: Self-pay | Admitting: *Deleted

## 2017-06-14 DIAGNOSIS — Z952 Presence of prosthetic heart valve: Secondary | ICD-10-CM

## 2017-06-14 DIAGNOSIS — Z5181 Encounter for therapeutic drug level monitoring: Secondary | ICD-10-CM

## 2017-06-14 DIAGNOSIS — I5022 Chronic systolic (congestive) heart failure: Secondary | ICD-10-CM

## 2017-06-14 LAB — POCT INR: INR: 2.5

## 2017-06-14 NOTE — Patient Instructions (Addendum)
Description   Continue  taking 1 tablet (5mg ) daily except 1/2 tablet (2.5mg ) on Monday, Wednesday, Fridays.  Call us with any medication changes or concerns [phone]. Recheck INR in 2 weeks

## 2017-06-14 NOTE — Telephone Encounter (Signed)
Pt called / informed " counts good; continue same dose Hydrea " per D Granforuna. Stated ok and sounds good.

## 2017-06-14 NOTE — Telephone Encounter (Signed)
-----   Message from Annia Belt, MD sent at 06/13/2017  2:01 PM EST ----- Call pt: counts good; continue same dose Hydrea

## 2017-06-18 ENCOUNTER — Other Ambulatory Visit: Payer: Self-pay | Admitting: Oncology

## 2017-06-28 ENCOUNTER — Ambulatory Visit (INDEPENDENT_AMBULATORY_CARE_PROVIDER_SITE_OTHER): Payer: BLUE CROSS/BLUE SHIELD

## 2017-06-28 DIAGNOSIS — I5022 Chronic systolic (congestive) heart failure: Secondary | ICD-10-CM | POA: Diagnosis not present

## 2017-06-28 DIAGNOSIS — Z952 Presence of prosthetic heart valve: Secondary | ICD-10-CM | POA: Diagnosis not present

## 2017-06-28 DIAGNOSIS — Z5181 Encounter for therapeutic drug level monitoring: Secondary | ICD-10-CM | POA: Diagnosis not present

## 2017-06-28 LAB — POCT INR: INR: 4.7

## 2017-06-28 NOTE — Patient Instructions (Signed)
Description   Skip today and tomorrow's dosage of Coumadin, then resume same dosage 1 tablet (5mg ) daily except 1/2 tablet (2.5mg ) on Mondays, Wednesdays, Fridays.  Call us with any medication changes or concerns [phone]. Recheck INR in 10 days

## 2017-07-09 ENCOUNTER — Encounter (INDEPENDENT_AMBULATORY_CARE_PROVIDER_SITE_OTHER): Payer: Self-pay

## 2017-07-09 ENCOUNTER — Ambulatory Visit (INDEPENDENT_AMBULATORY_CARE_PROVIDER_SITE_OTHER): Payer: Medicare Other

## 2017-07-09 DIAGNOSIS — I5022 Chronic systolic (congestive) heart failure: Secondary | ICD-10-CM | POA: Diagnosis not present

## 2017-07-09 DIAGNOSIS — Z952 Presence of prosthetic heart valve: Secondary | ICD-10-CM

## 2017-07-09 DIAGNOSIS — Z5181 Encounter for therapeutic drug level monitoring: Secondary | ICD-10-CM | POA: Diagnosis not present

## 2017-07-09 LAB — POCT INR: INR: 4.9

## 2017-07-09 NOTE — Patient Instructions (Signed)
Description   Skip today and tomorrow's dosage of Coumadin, then start taking 1/2 tablet daily except 1 tablet on Sundays and Thursdays.  Call us with any medication changes or concerns [phone]. Recheck INR in 10 days

## 2017-07-19 ENCOUNTER — Ambulatory Visit (INDEPENDENT_AMBULATORY_CARE_PROVIDER_SITE_OTHER): Payer: Medicare Other | Admitting: *Deleted

## 2017-07-19 DIAGNOSIS — I5022 Chronic systolic (congestive) heart failure: Secondary | ICD-10-CM | POA: Diagnosis not present

## 2017-07-19 DIAGNOSIS — Z952 Presence of prosthetic heart valve: Secondary | ICD-10-CM

## 2017-07-19 DIAGNOSIS — Z5181 Encounter for therapeutic drug level monitoring: Secondary | ICD-10-CM

## 2017-07-19 LAB — POCT INR: INR: 1.9

## 2017-07-19 NOTE — Patient Instructions (Signed)
Description   Today Feb 21st take 1 and 1/2 tablets (7.5mg ) then continue same dose of coumadin  1/2 tablet daily except 1 tablet on Sundays and Thursdays.  Call us with any medication changes or concerns [phone]. Recheck INR in  2 weeks Restarted Amiodarone 200 mg bid May 24 2017

## 2017-07-25 ENCOUNTER — Encounter (HOSPITAL_COMMUNITY): Payer: BLUE CROSS/BLUE SHIELD | Admitting: Internal Medicine

## 2017-07-27 ENCOUNTER — Ambulatory Visit (HOSPITAL_COMMUNITY)
Admission: RE | Admit: 2017-07-27 | Discharge: 2017-07-27 | Disposition: A | Discharge status: Home / Self Care | Payer: Medicare Other | Source: Ambulatory Visit | Attending: Internal Medicine | Admitting: Internal Medicine

## 2017-07-27 ENCOUNTER — Encounter (HOSPITAL_COMMUNITY): Payer: Self-pay | Admitting: Internal Medicine

## 2017-07-27 VITALS — BP 118/62 | HR 63 | Wt 171.4 lb

## 2017-07-27 DIAGNOSIS — Z87891 Personal history of nicotine dependence: Secondary | ICD-10-CM | POA: Insufficient documentation

## 2017-07-27 DIAGNOSIS — Z951 Presence of aortocoronary bypass graft: Secondary | ICD-10-CM | POA: Diagnosis not present

## 2017-07-27 DIAGNOSIS — I2510 Atherosclerotic heart disease of native coronary artery without angina pectoris: Secondary | ICD-10-CM | POA: Insufficient documentation

## 2017-07-27 DIAGNOSIS — Z952 Presence of prosthetic heart valve: Secondary | ICD-10-CM | POA: Insufficient documentation

## 2017-07-27 DIAGNOSIS — Z7901 Long term (current) use of anticoagulants: Secondary | ICD-10-CM | POA: Diagnosis not present

## 2017-07-27 DIAGNOSIS — Z7982 Long term (current) use of aspirin: Secondary | ICD-10-CM | POA: Diagnosis not present

## 2017-07-27 DIAGNOSIS — Z881 Allergy status to other antibiotic agents status: Secondary | ICD-10-CM | POA: Diagnosis not present

## 2017-07-27 DIAGNOSIS — I493 Ventricular premature depolarization: Secondary | ICD-10-CM

## 2017-07-27 DIAGNOSIS — Z79890 Hormone replacement therapy (postmenopausal): Secondary | ICD-10-CM | POA: Diagnosis not present

## 2017-07-27 DIAGNOSIS — I5022 Chronic systolic (congestive) heart failure: Secondary | ICD-10-CM | POA: Insufficient documentation

## 2017-07-27 DIAGNOSIS — I714 Abdominal aortic aneurysm, without rupture: Secondary | ICD-10-CM | POA: Insufficient documentation

## 2017-07-27 DIAGNOSIS — Z79899 Other long term (current) drug therapy: Secondary | ICD-10-CM | POA: Diagnosis not present

## 2017-07-27 DIAGNOSIS — D473 Essential (hemorrhagic) thrombocythemia: Secondary | ICD-10-CM | POA: Insufficient documentation

## 2017-07-27 LAB — COMPREHENSIVE METABOLIC PANEL
ALBUMIN: 3.9 g/dL (ref 3.5–5.0)
ALT: 43 U/L (ref 17–63)
ANION GAP: 9 (ref 5–15)
AST: 38 U/L (ref 15–41)
Alkaline Phosphatase: 69 U/L (ref 38–126)
BUN: 16 mg/dL (ref 6–20)
CHLORIDE: 105 mmol/L (ref 101–111)
CO2: 21 mmol/L — AB (ref 22–32)
Calcium: 9.3 mg/dL (ref 8.9–10.3)
Creatinine, Ser: 1.14 mg/dL (ref 0.61–1.24)
GFR calc non Af Amer: >60 mL/min (ref >60)
Glucose, Bld: 105 mg/dL — ABNORMAL HIGH (ref 65–99)
POTASSIUM: 4.9 mmol/L (ref 3.5–5.1)
SODIUM: 135 mmol/L (ref 135–145)
Total Bilirubin: 0.6 mg/dL (ref 0.3–1.2)
Total Protein: 7.1 g/dL (ref 6.5–8.1)

## 2017-07-27 LAB — CBC
HCT: 38.4 % — ABNORMAL LOW (ref 39.0–52.0)
Hemoglobin: 13.3 g/dL (ref 13.0–17.0)
MCH: 38.7 pg — AB (ref 26.0–34.0)
MCHC: 34.6 g/dL (ref 30.0–36.0)
MCV: 111.6 fL — ABNORMAL HIGH (ref 78.0–100.0)
PLATELETS: 380 10*3/uL (ref 150–400)
RBC: 3.44 MIL/uL — ABNORMAL LOW (ref 4.22–5.81)
RDW: 15.9 % — ABNORMAL HIGH (ref 11.5–15.5)
WBC: 3.6 10*3/uL — ABNORMAL LOW (ref 4.0–10.5)

## 2017-07-27 LAB — T4, FREE: Free T4: 1 ng/dL (ref 0.61–1.12)

## 2017-07-27 LAB — TSH: TSH: 8.059 u[IU]/mL — AB (ref 0.350–4.500)

## 2017-07-27 MED ORDER — SACUBITRIL-VALSARTAN 49-51 MG PO TABS: 1.0000 | ORAL_TABLET | Freq: Two times a day (BID) | ORAL | 3 refills | Status: DC

## 2017-07-27 MED ORDER — AMIODARONE HCL 200 MG PO TABS: 200.0000 mg | ORAL_TABLET | Freq: Every day | ORAL | 3 refills | Status: DC

## 2017-07-27 MED ORDER — ATORVASTATIN CALCIUM 40 MG PO TABS: 40.0000 mg | ORAL_TABLET | Freq: Every day | ORAL | 11 refills | Status: DC

## 2017-07-27 NOTE — Progress Notes (Signed)
ADVANCED HF CLINIC NOTE  Primary Cardiologist: Martin Majestic  PCP: Dr. Marveen [name] ([place], [place])  HPI:  Taylor [name] is a 67 y.o. male with mechnical AVR/MVR, CAD s/p CABG, systolic HF and essential thrombosis.   Has h/o congenital AoV abnormality with AI. In 1994 had MI and underwent PCI (unclear artery) and mechanical AVR at [facility].   Has h/o polysubstance abuse with cocaine, ETOH and tobacco abuse. No longer doing cocaine or smoking. Now drinks about 2 beers per day.  Did well from heart perspective until 2017. When he had VF arrest on 05/18/16. Found to have 3v CAD and MV disease and underwent CABG x 3 with mechanical MVR. Post-operatively had AF (treated with amio) and CHB requiring implantation of a BosSci CRT-D device.   Echo on 12/18 showed LVEF of 25-30% stable AVR/MVR  Had CT on 12/18 for his back. Showed 3.7cm AAA.   He presents today for regular follow up. Last visit started on amiodarone for PVC suppression (13% on monitor - has seen Dr. Caryl Comes). Losartan changed to Entresto. Feels good. Works as a Dealer. With Entresto got dizzy initially but now better.   and . He feels well. No orthopnea, PND, SOB, edema, or CP. No palpitations. He has been having trouble sleeping. Tried some of his mother's lorazepam but it didn't help. Good energy level. Marland Kitchen He drinks 5 bottles of water/day and drinks 2 beers/day. He has lost about 5 lbs in the last 2 months. No nausea or vomiting. He had a colonoscopy that showed diverticulosis (did not need antibiotics), 3 benign polyps, and internal hemorrhoids. 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 He does not weigh himself at home - scale battery is dead. Only other complaint is recent sciatica pain that has been relieved by TENS unit on lower back, but he recently read it could interfere with his pacemaker. No NSAIDS with back pains. He thought he was supposed to stop his losartan when the spiro was started. He saw Dr. Caryl Comes and  found to have 13% PVCs.    [employer] CRT-D: No VT/VF. Activity level 1 hr/day.   Review of systems complete and found to be negative unless listed in HPI.    PMHx: As per HPI  Current Outpatient Medications  Medication Sig Dispense Refill  . amiodarone (PACERONE) 200 MG tablet Take 1 tablet (200 mg total) by mouth 2 (two) times daily. 60 tablet 3  . aspirin 81 MG tablet Take 1 tablet by mouth.    . carvedilol (COREG) 6.25 MG tablet Take 6.25 mg by mouth 2 (two) times daily as needed.    . hydroxyurea (HYDREA) 500 MG capsule TAKE 1 CAPSULE BY MOUTH ALTERNATING WITH 2 CAPSULES DAILY(500 MG ONE DAY AND 1000 MG NEXT DAY). 135 capsule 3  . levothyroxine (SYNTHROID, LEVOTHROID) 25 MCG tablet Take 50 mcg by mouth daily.    . sacubitril-valsartan (ENTRESTO) 24-26 MG Take 1 tablet by mouth 2 (two) times daily. 60 tablet 3  . spironolactone (ALDACTONE) 25 MG tablet Take 0.5 tablets (12.5 mg total) by mouth daily. 15 tablet 6  . traMADol (ULTRAM) 50 MG tablet TAKE 1 TABLET BY MOUTH EVERY 6 HOURS AS NEEDED 30 tablet 0  . warfarin (COUMADIN) 5 MG tablet Take as directed by coumadin clinic 30 tablet 0  . atorvastatin (LIPITOR) 80 MG tablet Take 40 mg by mouth daily.    . triazolam (HALCION) 0.25 MG tablet Take 1 tablet by mouth at  bedtime.  0   No current facility-administered medications for this encounter.     Allergies  Allergen Reactions  . Zithromax [Azithromycin] Rash      Social History   Socioeconomic History  . Marital status: Married    Spouse name: Not on file  . Number of children: Not on file  . Years of education: Not on file  . Highest education level: Not on file  Social Needs  . Financial resource strain: Not on file  . Food insecurity - worry: Not on file  . Food insecurity - inability: Not on file  . Transportation needs - medical: Not on file  . Transportation needs - non-medical: Not on file  Occupational History  . Not on file  Tobacco Use  . Smoking  status: Former Smoker    Packs/day: 1.00    Years: 30.00    Pack years: 30.00    Types: Cigarettes    Start date: 05/26/1979    Last attempt to quit: 05/18/2016    Years since quitting: 1.1  . Smokeless tobacco: Never Used  Substance and Sexual Activity  . Alcohol use: Not on file    Comment: 2 cans a day..  . Drug use: No  . Sexual activity: Not on file  Other Topics Concern  . Not on file  Social History Narrative  . Not on file   SocHX: . Smoking status: Former Smoker  Packs/day: 1.00  Years: 33.00  Types: Cigarettes  Quit date: 05/17/2016  . Smokeless tobacco: Never Used  . Alcohol use 8.4 oz/week  14 Cans of beer per week  Comment: 2 cans beer per day  . Drug use: No   FamHX:  Father died from CHF at 26 Mother alive no heart problems H/o colon CA on Moms side 1 sister who is fine   Vitals:   07/27/17 1055  BP: 118/62  Pulse: 63  SpO2: 97%  Weight: 171 lb 6.4 oz (77.7 kg)   Wt Readings from Last 3 Encounters:  07/27/17 171 lb 6.4 oz (77.7 kg)  05/24/17 172 lb (78 kg)  04/17/17 169 lb 14.4 oz (77.1 kg)    PHYSICAL EXAM: General:  Well appearing. No resp difficulty HEENT: normal Neck: supple. no JVD. Carotids 2+ bilat; no bruits. No lymphadenopathy or thryomegaly appreciated. Cor: PMI nondisplaced. Regular rate & rhythm. No rubs, gallops or murmurs. Lungs: clear Abdomen: soft, nontender, nondistended. No hepatosplenomegaly. No bruits or masses. Good bowel sounds. Extremities: no cyanosis, clubbing, rash, edema Neuro: alert & orientedx3, cranial nerves grossly intact. moves all 4 extremities w/o difficulty. Affect pleasant  ASSESSMENT & PLAN:  1. Chronic systolic HF - Echo 02/54: EF 25-30%  - NYHA II - Volume status ok. Not requiring any loop diuretics - Continue spiro 12.5mg  daily - Increase Entresto 49/51 mg BID. If dizzy can cut back - Continue carvedilol 6.25 BID. HR too low to titrate - Reinforced fluid restriction to < 2 L daily, sodium  restriction to less than 2000 mg daily, and the importance of daily weights.   - Check labs today - ICD interrogated personally. No VT/VF  2. CAD s/p CABG - No s/s of ischemia.    - Continue statin, b-blocker and ASA (with mechanical valves will not stop ASA in presence of warfarin)  3. S/p mechanical AVR/MVR - Continue ASA and coumadin. Denies bleeding.  - Reminded him of need for SBE prophylaxis   4. Post-op AF - Resolved.  - Remains on warfarin for valves  5. Frequent PVCs - ICD interrogation with 13% PVCs. Unclear if this is contributing to LV dyfunction - Repeat ECG today.  - Cut amio 200 daily  - Will need 48 hour monitor at next visit and f/u echo  6. Essential thrombocytosis - Continue hydroxyurea - Follow up with Hematology  7. AAA - CT scan 12/18 3.7cm - Will do u/s in 12/19 to reassess  Glori Bickers, MD  11:33 AM

## 2017-07-27 NOTE — Addendum Note (Signed)
Encounter addended by: Scarlette Calico, RN on: 07/27/2017 11:55 AM  Actions taken: Diagnosis association updated, Pharmacy for encounter modified, Order list changed, Sign clinical note

## 2017-07-27 NOTE — Patient Instructions (Addendum)
Decrease Amiodarone to 200 mg daily  Increase Entresto to 49/51 mg Twice daily   Restart Atorvastatin 40 mg daily  Labs today  Your physician recommends that you schedule a follow-up appointment in: 3 months

## 2017-08-02 ENCOUNTER — Ambulatory Visit (INDEPENDENT_AMBULATORY_CARE_PROVIDER_SITE_OTHER): Payer: Medicare Other | Admitting: *Deleted

## 2017-08-02 DIAGNOSIS — Z952 Presence of prosthetic heart valve: Secondary | ICD-10-CM

## 2017-08-02 DIAGNOSIS — Z5181 Encounter for therapeutic drug level monitoring: Secondary | ICD-10-CM

## 2017-08-02 DIAGNOSIS — I5022 Chronic systolic (congestive) heart failure: Secondary | ICD-10-CM

## 2017-08-02 LAB — POCT INR: INR: 2

## 2017-08-02 NOTE — Patient Instructions (Signed)
Description   Today take 1.5 tablets then continue same dose of coumadin 1/2 tablet daily except 1 tablet on Sundays and Thursdays.  Call us with any medication changes or concerns [phone]. Recheck INR in 2 weeks. Reduced Amiodarone to 200 mg qd 07/27/17 from 400mg  bid.

## 2017-08-09 ENCOUNTER — Ambulatory Visit (INDEPENDENT_AMBULATORY_CARE_PROVIDER_SITE_OTHER): Payer: BLUE CROSS/BLUE SHIELD | Admitting: *Deleted

## 2017-08-09 DIAGNOSIS — I5022 Chronic systolic (congestive) heart failure: Secondary | ICD-10-CM

## 2017-08-09 NOTE — Progress Notes (Signed)
Remote ICD transmission.   

## 2017-08-10 ENCOUNTER — Encounter: Payer: Self-pay | Admitting: Cardiology

## 2017-08-14 ENCOUNTER — Ambulatory Visit (INDEPENDENT_AMBULATORY_CARE_PROVIDER_SITE_OTHER): Payer: Medicare Other | Admitting: Oncology

## 2017-08-14 ENCOUNTER — Other Ambulatory Visit: Payer: Self-pay

## 2017-08-14 ENCOUNTER — Other Ambulatory Visit: Payer: Medicare Other

## 2017-08-14 ENCOUNTER — Encounter: Payer: Self-pay | Admitting: Oncology

## 2017-08-14 VITALS — BP 90/50 | HR 62 | Temp 97.8°F | Ht 69.0 in | Wt 170.4 lb

## 2017-08-14 DIAGNOSIS — Z79890 Hormone replacement therapy (postmenopausal): Secondary | ICD-10-CM

## 2017-08-14 DIAGNOSIS — F1311 Sedative, hypnotic or anxiolytic abuse, in remission: Secondary | ICD-10-CM

## 2017-08-14 DIAGNOSIS — Z881 Allergy status to other antibiotic agents status: Secondary | ICD-10-CM | POA: Diagnosis not present

## 2017-08-14 DIAGNOSIS — Z95810 Presence of automatic (implantable) cardiac defibrillator: Secondary | ICD-10-CM | POA: Diagnosis not present

## 2017-08-14 DIAGNOSIS — Z7982 Long term (current) use of aspirin: Secondary | ICD-10-CM | POA: Diagnosis not present

## 2017-08-14 DIAGNOSIS — I2510 Atherosclerotic heart disease of native coronary artery without angina pectoris: Secondary | ICD-10-CM

## 2017-08-14 DIAGNOSIS — D473 Essential (hemorrhagic) thrombocythemia: Secondary | ICD-10-CM

## 2017-08-14 DIAGNOSIS — Z7901 Long term (current) use of anticoagulants: Secondary | ICD-10-CM | POA: Diagnosis not present

## 2017-08-14 DIAGNOSIS — Z954 Presence of other heart-valve replacement: Secondary | ICD-10-CM | POA: Diagnosis not present

## 2017-08-14 DIAGNOSIS — I252 Old myocardial infarction: Secondary | ICD-10-CM

## 2017-08-14 DIAGNOSIS — E039 Hypothyroidism, unspecified: Secondary | ICD-10-CM | POA: Diagnosis not present

## 2017-08-14 DIAGNOSIS — Z79899 Other long term (current) drug therapy: Secondary | ICD-10-CM | POA: Diagnosis not present

## 2017-08-14 DIAGNOSIS — Z87891 Personal history of nicotine dependence: Secondary | ICD-10-CM

## 2017-08-14 DIAGNOSIS — F1411 Cocaine abuse, in remission: Secondary | ICD-10-CM | POA: Diagnosis not present

## 2017-08-14 NOTE — Patient Instructions (Signed)
Continue CBC every 2 months MD visit 6 months

## 2017-08-15 NOTE — Progress Notes (Signed)
Hematology and Oncology Follow Up Visit  Taylor [name] [id number] [birth year] 67 y.o. 08/15/2017 4:14 PM   Principle Diagnosis: Encounter Diagnosis  Name Primary?  . Essential thrombocythemia [facility]) Yes  Clinical summary: 67 year old man who established care with me in September 2018 for ongoing treatment of essential thrombocythemia.  Diagnosed in approximately January 2009 when he presented with recurrent episodes of memory loss and dizziness.  MRI with probable lacunar infarcts in the right thalamus and left caudate body.  Elevated platelet count noted at that time.  He was referred to a hematologist and started on aspirin plus hydroxyurea.  Neurologic symptoms resolved. JAK-2 mutation status not known to the patient.  He does remember having a bone marrow biopsy at time of diagnosis.  He has been on a stable dose of Hydrea 500 mg daily alternating with 1000 mg. He has a mechanical aortic valve on chronic Coumadin anticoagulation.  Status post ICD/pacemaker.  Multivessel coronary disease.  History of a non-ST wave elevation MI December 2017.  Former substance abuse with cocaine and benzodiazepines leading to a nonischemic cardiomyopathy and possibly related to aortic valve failure.  Interim History: Overall doing well.  He denies any severe headache, change in vision, slurred speech, paresthesias, or focal weakness.  No chest pain, dyspnea, palpitations.  His implantable defibrillator has not discharged.  No bleeding issues on Coumadin.  Medications: reviewed  Allergies:  Allergies  Allergen Reactions  . Zithromax [Azithromycin] Rash    Review of Systems: See interim history Remaining ROS negative:   Physical Exam: Blood pressure (!) 90/50, pulse 62, temperature 97.8 F (36.6 C), temperature source Oral, height _0  (1.753 m), weight 170 lb 6.4 oz (77.3 kg), SpO2 99 %. Wt Readings from Last 3 Encounters:  08/14/17 170 lb 6.4 oz (77.3 kg)  07/27/17 171 lb 6.4 oz (77.7 kg)  05/24/17  172 lb (78 kg)     General appearance: Well-nourished Caucasian man HENNT: Pharynx no erythema, exudate, mass, or ulcer. No thyromegaly or thyroid nodules Lymph nodes: No cervical, supraclavicular, or axillary lymphadenopathy Breasts:  Lungs: Clear to auscultation, resonant to percussion throughout Heart: Regular rhythm, no murmur, no gallop, no rub, no click, no edema Abdomen: Soft, nontender, normal bowel sounds, no mass, no organomegaly Extremities: No edema, no calf tenderness Musculoskeletal: no joint deformities GU:  Vascular: Carotid pulses 2+, no bruits, distal pulses: Dorsalis pedis 1+ symmetric Neurologic: Alert, oriented, PERRLA, optic discs sharp on the left and vessels normal, no hemorrhage or exudate, disc not visualized on the right, cranial nerves grossly normal, motor strength 5 over 5, reflexes 1+ symmetric at the biceps, absent symmetric at the knees,, upper body coordination normal, gait normal, Skin: No rash or ecchymosis  Lab Results: CBC W/Diff    Component Value Date/Time   WBC 3.6 (L) 07/27/2017 1143   RBC 3.44 (L) 07/27/2017 1143   HGB 13.3 07/27/2017 1143   HGB 13.9 06/12/2017 1348   HCT 38.4 (L) 07/27/2017 1143   HCT 40.9 06/12/2017 1348   PLT 380 07/27/2017 1143   PLT 380 (H) 06/12/2017 1348   MCV 111.6 (H) 07/27/2017 1143   MCV 109 (H) 06/12/2017 1348   MCH 38.7 (H) 07/27/2017 1143   MCHC 34.6 07/27/2017 1143   RDW 15.9 (H) 07/27/2017 1143   RDW 16.0 (H) 06/12/2017 1348   LYMPHSABS 0.8 06/12/2017 1348   MONOABS 0.3 04/17/2017 1533   EOSABS 0.1 06/12/2017 1348   BASOSABS 0.0 06/12/2017 1348     Chemistry  Component Value Date/Time   NA 135 07/27/2017 1143   K 4.9 07/27/2017 1143   CL 105 07/27/2017 1143   CO2 21 (L) 07/27/2017 1143   BUN 16 07/27/2017 1143   CREATININE 1.14 07/27/2017 1143      Component Value Date/Time   CALCIUM 9.3 07/27/2017 1143   ALKPHOS 69 07/27/2017 1143   AST 38 07/27/2017 1143   ALT 43 07/27/2017 1143    BILITOT 0.6 07/27/2017 1143       Radiological Studies: No results found.  Impression:  1.  Essential thrombocythemia diagnosed 2009 Stable on current dose of Hydrea.  Mild leukopenia is acceptable and I will not change his dose.  2.  Chronic anticoagulation for mechanical aortic valve I note that his INR was subtherapeutic for mechanical valve recorded on March 7.  I would hope that the clinic monitoring his Coumadin made an adjustment.  He needs to be in a target range of 2.5-3.5.  3.  Ischemic versus nonischemic probably combined cardiomyopathy  4.  Known coronary artery disease status post previous non-ST wave elevation MI  5.  History of ventricular tachycardia status post ICD/pacemaker  6.  Hypothyroid on replacement  CC: Patient Care Team: Michell Heinrich, DO as PCP - General (Family Medicine)   Murriel Hopper, MD, [place]  Hematology-Oncology/Internal Medicine     3/20/20194:14 PM

## 2017-08-16 ENCOUNTER — Ambulatory Visit (INDEPENDENT_AMBULATORY_CARE_PROVIDER_SITE_OTHER): Payer: Medicare Other | Admitting: Pharmacist

## 2017-08-16 DIAGNOSIS — I5022 Chronic systolic (congestive) heart failure: Secondary | ICD-10-CM | POA: Diagnosis not present

## 2017-08-16 DIAGNOSIS — Z5181 Encounter for therapeutic drug level monitoring: Secondary | ICD-10-CM | POA: Diagnosis not present

## 2017-08-16 DIAGNOSIS — Z952 Presence of prosthetic heart valve: Secondary | ICD-10-CM | POA: Diagnosis not present

## 2017-08-16 LAB — POCT INR: INR: 4.4

## 2017-08-16 NOTE — Patient Instructions (Signed)
Description   Skip your Coumadin today and tomorrow, then continue same dose of Coumadin 1/2 tablet daily except 1 tablet on Sundays and Thursdays.  Call us with any medication changes or concerns [phone]. Recheck INR in 2 weeks. Reduced Amiodarone to 200 mg qd 07/27/17 from 400mg  BID.

## 2017-08-24 LAB — CUP PACEART REMOTE DEVICE CHECK
Battery Remaining Longevity: 96 mo
HIGH POWER IMPEDANCE MEASURED VALUE: 66 Ohm
Implantable Lead Implant Date: 20180105
Implantable Lead Implant Date: 20180105
Implantable Lead Location: 753858
Implantable Lead Location: 753859
Implantable Lead Model: 4672
Implantable Lead Serial Number: 801088
Implantable Lead Serial Number: 849519
Lead Channel Impedance Value: 546 Ohm
Lead Channel Pacing Threshold Amplitude: 0.6 V
Lead Channel Pacing Threshold Amplitude: 1 V
Lead Channel Pacing Threshold Pulse Width: 0.4 ms
Lead Channel Pacing Threshold Pulse Width: 1 ms
Lead Channel Setting Pacing Amplitude: 2 V
Lead Channel Setting Pacing Pulse Width: 1 ms
Lead Channel Setting Sensing Sensitivity: 0.6 mV
MDC IDC LEAD IMPLANT DT: 20180105
MDC IDC LEAD LOCATION: 753860
MDC IDC LEAD SERIAL: 428973
MDC IDC MSMT BATTERY REMAINING PERCENTAGE: 100 %
MDC IDC MSMT LEADCHNL RA IMPEDANCE VALUE: 790 Ohm
MDC IDC MSMT LEADCHNL RA PACING THRESHOLD AMPLITUDE: 0.7 V
MDC IDC MSMT LEADCHNL RA PACING THRESHOLD PULSEWIDTH: 0.4 ms
MDC IDC MSMT LEADCHNL RV IMPEDANCE VALUE: 644 Ohm
MDC IDC PG IMPLANT DT: 20180105
MDC IDC PG SERIAL: 180139
MDC IDC SESS DTM: 20190314051100
MDC IDC SET LEADCHNL LV PACING AMPLITUDE: 2.2 V
MDC IDC SET LEADCHNL LV SENSING SENSITIVITY: 1 mV
MDC IDC SET LEADCHNL RA PACING AMPLITUDE: 2 V
MDC IDC SET LEADCHNL RV PACING PULSEWIDTH: 0.4 ms
MDC IDC STAT BRADY RA PERCENT PACED: 30 %
MDC IDC STAT BRADY RV PERCENT PACED: 79 %

## 2017-09-06 ENCOUNTER — Ambulatory Visit (INDEPENDENT_AMBULATORY_CARE_PROVIDER_SITE_OTHER): Payer: Medicare Other

## 2017-09-06 DIAGNOSIS — Z5181 Encounter for therapeutic drug level monitoring: Secondary | ICD-10-CM | POA: Diagnosis not present

## 2017-09-06 DIAGNOSIS — Z952 Presence of prosthetic heart valve: Secondary | ICD-10-CM | POA: Diagnosis not present

## 2017-09-06 DIAGNOSIS — I5022 Chronic systolic (congestive) heart failure: Secondary | ICD-10-CM

## 2017-09-06 LAB — PROTIME-INR
INR: 6.2 — AB (ref 0.8–1.2)
PROTHROMBIN TIME: 65.6 s — AB (ref 9.1–12.0)

## 2017-09-06 LAB — POCT INR: INR: 7

## 2017-09-13 ENCOUNTER — Ambulatory Visit (INDEPENDENT_AMBULATORY_CARE_PROVIDER_SITE_OTHER): Payer: Medicare Other | Admitting: *Deleted

## 2017-09-13 DIAGNOSIS — I5022 Chronic systolic (congestive) heart failure: Secondary | ICD-10-CM

## 2017-09-13 DIAGNOSIS — Z5181 Encounter for therapeutic drug level monitoring: Secondary | ICD-10-CM

## 2017-09-13 DIAGNOSIS — Z952 Presence of prosthetic heart valve: Secondary | ICD-10-CM

## 2017-09-13 LAB — POCT INR: INR: 2.5

## 2017-09-13 NOTE — Patient Instructions (Signed)
Description   Start taking 1/2 tablet daily except 1 tablet on Sundays. Continue eating green leafy veggies twice a week.  Recheck INR in 1 week.  Coumadin Clinic [phone] Main [phone] Reduced Amiodarone to 200 mg qd 07/27/17 from 400mg  BID.

## 2017-09-17 ENCOUNTER — Telehealth: Payer: Self-pay | Admitting: *Deleted

## 2017-09-17 ENCOUNTER — Other Ambulatory Visit: Payer: Self-pay | Admitting: *Deleted

## 2017-09-17 NOTE — Telephone Encounter (Signed)
Pt called & reported that he is now taking Lexapro 10mg  daily. Advised med does not interact with Coumadin/Warfarin and he verbalized understanding. Med added to med list.

## 2017-09-18 ENCOUNTER — Telehealth (HOSPITAL_COMMUNITY): Payer: Self-pay

## 2017-09-18 NOTE — Telephone Encounter (Signed)
Advanced Heart Failure Triage Encounter  Patient Name: Taylor [name]  Date of Call: 09/18/17  Problem:  Pt called c/o SOB since saturday especially while laying unable to sleep and tiered. Pt. denies SOB, CP, cough, and edema. Pt has not had taken his amiodarone in 2-3 days has taken it today and feels better now.   Plan:    Shirley Muscat, RN

## 2017-09-18 NOTE — Telephone Encounter (Signed)
Pt aware says he feels much better since taking his amio. Will call back if he has any changes.

## 2017-09-18 NOTE — Telephone Encounter (Signed)
If recurs can get EKG. Otherwise follow.     Legrand Como [address]" [place], PA-C 09/18/2017 10:35 AM

## 2017-09-20 ENCOUNTER — Ambulatory Visit (INDEPENDENT_AMBULATORY_CARE_PROVIDER_SITE_OTHER): Payer: Medicare Other | Admitting: *Deleted

## 2017-09-20 DIAGNOSIS — Z952 Presence of prosthetic heart valve: Secondary | ICD-10-CM | POA: Diagnosis not present

## 2017-09-20 DIAGNOSIS — I5022 Chronic systolic (congestive) heart failure: Secondary | ICD-10-CM | POA: Diagnosis not present

## 2017-09-20 DIAGNOSIS — Z5181 Encounter for therapeutic drug level monitoring: Secondary | ICD-10-CM

## 2017-09-20 LAB — POCT INR: INR: 1.6

## 2017-09-20 NOTE — Patient Instructions (Signed)
Description   Today take coumadin 1 tablet (5mg ) then continue same dose of coumadin  1/2 tablet daily except 1 tablet on Sundays. Continue eating green leafy veggies twice a week.  Recheck INR in 2 weeks  Coumadin Clinic [phone] Main [phone] Taking  Amiodarone 100 mg bid restarted Amiodarone on Monday had been off for 1 week.

## 2017-10-04 ENCOUNTER — Other Ambulatory Visit (INDEPENDENT_AMBULATORY_CARE_PROVIDER_SITE_OTHER): Payer: Medicare Other

## 2017-10-04 DIAGNOSIS — D473 Essential (hemorrhagic) thrombocythemia: Secondary | ICD-10-CM | POA: Diagnosis present

## 2017-10-05 ENCOUNTER — Ambulatory Visit (INDEPENDENT_AMBULATORY_CARE_PROVIDER_SITE_OTHER): Payer: Medicare Other

## 2017-10-05 DIAGNOSIS — Z952 Presence of prosthetic heart valve: Secondary | ICD-10-CM

## 2017-10-05 DIAGNOSIS — I5022 Chronic systolic (congestive) heart failure: Secondary | ICD-10-CM

## 2017-10-05 DIAGNOSIS — Z5181 Encounter for therapeutic drug level monitoring: Secondary | ICD-10-CM | POA: Diagnosis not present

## 2017-10-05 LAB — CBC WITH DIFFERENTIAL/PLATELET
BASOS: 1 %
Basophils Absolute: 0 10*3/uL (ref 0.0–0.2)
EOS (ABSOLUTE): 0.1 10*3/uL (ref 0.0–0.4)
EOS: 2 %
HEMATOCRIT: 37.4 % — AB (ref 37.5–51.0)
HEMOGLOBIN: 12.7 g/dL — AB (ref 13.0–17.7)
IMMATURE GRANS (ABS): 0 10*3/uL (ref 0.0–0.1)
IMMATURE GRANULOCYTES: 0 %
Lymphocytes Absolute: 0.9 10*3/uL (ref 0.7–3.1)
Lymphs: 17 %
MCH: 37.6 pg — ABNORMAL HIGH (ref 26.6–33.0)
MCHC: 34 g/dL (ref 31.5–35.7)
MCV: 111 fL — AB (ref 79–97)
MONOCYTES: 7 %
MONOS ABS: 0.4 10*3/uL (ref 0.1–0.9)
NEUTROS PCT: 73 %
Neutrophils Absolute: 3.7 10*3/uL (ref 1.4–7.0)
Platelets: 460 10*3/uL — ABNORMAL HIGH (ref 150–379)
RBC: 3.38 x10E6/uL — ABNORMAL LOW (ref 4.14–5.80)
RDW: 15 % (ref 12.3–15.4)
WBC: 5 10*3/uL (ref 3.4–10.8)

## 2017-10-05 LAB — POCT INR: INR: 2.5

## 2017-10-05 NOTE — Patient Instructions (Signed)
Description   Continue on same dosage 1/2 tablet daily except 1 tablet on Sundays. Continue eating green leafy veggies twice a week.  Recheck INR in 3 weeks. Coumadin Clinic [phone] Main [phone] Taking Amiodarone 200mg BID per pt report.     

## 2017-10-25 ENCOUNTER — Ambulatory Visit (INDEPENDENT_AMBULATORY_CARE_PROVIDER_SITE_OTHER): Payer: Medicare Other | Admitting: *Deleted

## 2017-10-25 DIAGNOSIS — I5022 Chronic systolic (congestive) heart failure: Secondary | ICD-10-CM | POA: Diagnosis not present

## 2017-10-25 DIAGNOSIS — Z5181 Encounter for therapeutic drug level monitoring: Secondary | ICD-10-CM | POA: Diagnosis not present

## 2017-10-25 DIAGNOSIS — Z952 Presence of prosthetic heart valve: Secondary | ICD-10-CM

## 2017-10-25 LAB — POCT INR: INR: 2.7 (ref 2.0–3.0)

## 2017-10-25 NOTE — Patient Instructions (Signed)
Description   Continue on same dosage 1/2 tablet daily except 1 tablet on Sundays. Continue eating green leafy veggies twice a week.  Recheck INR in 4 weeks  Coumadin Clinic [phone] Main [phone] Taking Amiodarone 200mg  BID per pt report.

## 2017-11-02 ENCOUNTER — Ambulatory Visit (HOSPITAL_COMMUNITY)
Admission: RE | Admit: 2017-11-02 | Discharge: 2017-11-02 | Disposition: A | Discharge status: Home / Self Care | Payer: Medicare Other | Source: Ambulatory Visit | Attending: Adult Health | Admitting: Adult Health

## 2017-11-02 ENCOUNTER — Ambulatory Visit (HOSPITAL_COMMUNITY)
Admission: RE | Admit: 2017-11-02 | Discharge: 2017-11-02 | Disposition: A | Discharge status: Home / Self Care | Payer: Medicare Other | Source: Ambulatory Visit | Attending: Cardiology | Admitting: Cardiology

## 2017-11-02 VITALS — BP 96/58 | HR 62 | Wt 165.2 lb

## 2017-11-02 DIAGNOSIS — F1410 Cocaine abuse, uncomplicated: Secondary | ICD-10-CM | POA: Diagnosis not present

## 2017-11-02 DIAGNOSIS — Z95810 Presence of automatic (implantable) cardiac defibrillator: Secondary | ICD-10-CM | POA: Insufficient documentation

## 2017-11-02 DIAGNOSIS — I5022 Chronic systolic (congestive) heart failure: Secondary | ICD-10-CM

## 2017-11-02 DIAGNOSIS — Z7982 Long term (current) use of aspirin: Secondary | ICD-10-CM | POA: Diagnosis not present

## 2017-11-02 DIAGNOSIS — I252 Old myocardial infarction: Secondary | ICD-10-CM | POA: Diagnosis not present

## 2017-11-02 DIAGNOSIS — I714 Abdominal aortic aneurysm, without rupture: Secondary | ICD-10-CM | POA: Insufficient documentation

## 2017-11-02 DIAGNOSIS — J449 Chronic obstructive pulmonary disease, unspecified: Secondary | ICD-10-CM | POA: Diagnosis not present

## 2017-11-02 DIAGNOSIS — Z952 Presence of prosthetic heart valve: Secondary | ICD-10-CM | POA: Diagnosis not present

## 2017-11-02 DIAGNOSIS — Z87891 Personal history of nicotine dependence: Secondary | ICD-10-CM | POA: Diagnosis not present

## 2017-11-02 DIAGNOSIS — Z7901 Long term (current) use of anticoagulants: Secondary | ICD-10-CM | POA: Diagnosis not present

## 2017-11-02 DIAGNOSIS — Z79890 Hormone replacement therapy (postmenopausal): Secondary | ICD-10-CM | POA: Insufficient documentation

## 2017-11-02 DIAGNOSIS — Z79899 Other long term (current) drug therapy: Secondary | ICD-10-CM | POA: Insufficient documentation

## 2017-11-02 DIAGNOSIS — I480 Paroxysmal atrial fibrillation: Secondary | ICD-10-CM

## 2017-11-02 DIAGNOSIS — Z951 Presence of aortocoronary bypass graft: Secondary | ICD-10-CM | POA: Insufficient documentation

## 2017-11-02 DIAGNOSIS — D473 Essential (hemorrhagic) thrombocythemia: Secondary | ICD-10-CM | POA: Diagnosis not present

## 2017-11-02 DIAGNOSIS — I2510 Atherosclerotic heart disease of native coronary artery without angina pectoris: Secondary | ICD-10-CM | POA: Insufficient documentation

## 2017-11-02 DIAGNOSIS — I493 Ventricular premature depolarization: Secondary | ICD-10-CM | POA: Diagnosis not present

## 2017-11-02 LAB — BASIC METABOLIC PANEL
ANION GAP: 7 (ref 5–15)
BUN: 19 mg/dL (ref 6–20)
CALCIUM: 8.9 mg/dL (ref 8.9–10.3)
CHLORIDE: 109 mmol/L (ref 101–111)
CO2: 24 mmol/L (ref 22–32)
Creatinine, Ser: 1.03 mg/dL (ref 0.61–1.24)
GFR calc Af Amer: >60 mL/min (ref >60)
GFR calc non Af Amer: >60 mL/min (ref >60)
GLUCOSE: 100 mg/dL — AB (ref 65–99)
POTASSIUM: 4.3 mmol/L (ref 3.5–5.1)
Sodium: 140 mmol/L (ref 135–145)

## 2017-11-02 MED ORDER — AMIODARONE HCL 200 MG PO TABS: 200.0000 mg | ORAL_TABLET | Freq: Every day | ORAL | 11 refills | Status: DC

## 2017-11-02 MED ORDER — SACUBITRIL-VALSARTAN 24-26 MG PO TABS: 1.0000 | ORAL_TABLET | Freq: Two times a day (BID) | ORAL | 11 refills | Status: DC

## 2017-11-02 NOTE — Progress Notes (Signed)
ADVANCED HF CLINIC NOTE  Primary Cardiologist: Martin Majestic  PCP: Dr. Marveen Reeks ([place], [place]) HF MD: Dr Vaughan Browner  EP: Dr Leandro Reasoner   HPI: Taylor [name] is a 67 y.o. male with mechnical AVR/MVR, CAD s/p CABG, systolic HF and essential thrombosis.   Has h/o congenital AoV abnormality with AI. In 1994 had MI and underwent PCI (unclear artery) and mechanical AVR at [facility].   Has h/o polysubstance abuse with cocaine, ETOH and tobacco abuse. No longer doing cocaine or smoking. Now drinks about 2 beers per day.  Did well from heart perspective until 2017. When he had VF arrest on 05/18/16. Found to have 3v CAD and MV disease and underwent CABG x 3 with mechanical MVR. Post-operatively had AF (treated with amio) and CHB requiring implantation of a Bos[employer]i CRT-D device.   Today he returns for HF follow up. Last visit entresto was increased to 49-51 mg twice a day however due to dizziness he cut back to once a day. He feels like his device has moved.  Overall feeling ok. Says he has lost 5-10 pounds since the last visit. Denies SOB/PND/Orthopnea. No bleeding issues. Appetite fair.  No fever or chills. Taking all medications.    Echo on 12/18 showed LVEF of 25-30% stable AVR/MVR  Had CT on 12/18 for his back. Showed 3.7cm AAA.     [employer] CRT-D: No VT/VF. Activity level 1 hr/day.   Review of systems complete and found to be negative unless listed in HPI.    PMHx: As per HPI  Current Outpatient Medications  Medication Sig Dispense Refill  . amiodarone (PACERONE) 200 MG tablet Take 200 mg by mouth 2 (two) times daily.    Marland Kitchen aspirin 81 MG tablet Take 1 tablet by mouth.    Marland Kitchen atorvastatin (LIPITOR) 40 MG tablet Take 1 tablet (40 mg total) by mouth daily. 30 tablet 11  . carvedilol (COREG) 6.25 MG tablet Take 6.25 mg by mouth 2 (two) times daily as needed.    . hydroxyurea (HYDREA) 500 MG capsule TAKE 1 CAPSULE BY MOUTH ALTERNATING WITH 2 CAPSULES DAILY(500 MG ONE DAY AND 1000 MG NEXT DAY). 135  capsule 3  . levothyroxine (SYNTHROID, LEVOTHROID) 25 MCG tablet Take 50 mcg by mouth daily.    . Probiotic Product (PROBIOTIC DAILY PO) Take by mouth.    . sacubitril-valsartan (ENTRESTO) 49-51 MG Take 1 tablet by mouth daily.    Marland Kitchen spironolactone (ALDACTONE) 25 MG tablet Take 0.5 tablets (12.5 mg total) by mouth daily. 15 tablet 6  . warfarin (COUMADIN) 5 MG tablet Take as directed by coumadin clinic 30 tablet 0   No current facility-administered medications for this encounter.     Allergies  Allergen Reactions  . Zithromax [Azithromycin] Rash      Social History   Socioeconomic History  . Marital status: Married    Spouse name: Not on file  . Number of children: Not on file  . Years of education: Not on file  . Highest education level: Not on file  Occupational History  . Not on file  Social Needs  . Financial resource strain: Not on file  . Food insecurity:    Worry: Not on file    Inability: Not on file  . Transportation needs:    Medical: Not on file    Non-medical: Not on file  Tobacco Use  . Smoking status: Former Smoker    Packs/day: 1.00    Years: 30.00    Pack years: 30.00  Types: Cigarettes    Start date: 05/26/1979    Last attempt to quit: 05/18/2016    Years since quitting: 1.4  . Smokeless tobacco: Never Used  Substance and Sexual Activity  . Alcohol use: Yes    Alcohol/week: 8.4 oz    Types: 14 Cans of beer per week    Comment: 2 cans a day..  . Drug use: No  . Sexual activity: Not on file  Lifestyle  . Physical activity:    Days per week: Not on file    Minutes per session: Not on file  . Stress: Not on file  Relationships  . Social connections:    Talks on phone: Not on file    Gets together: Not on file    Attends religious service: Not on file    Active member of club or organization: Not on file    Attends meetings of clubs or organizations: Not on file    Relationship status: Not on file  . Intimate partner violence:    Fear of  current or ex partner: Not on file    Emotionally abused: Not on file    Physically abused: Not on file    Forced sexual activity: Not on file  Other Topics Concern  . Not on file  Social History Narrative  . Not on file   SocHX: . Smoking status: Former Smoker  Packs/day: 1.00  Years: 33.00  Types: Cigarettes  Quit date: 05/17/2016  . Smokeless tobacco: Never Used  . Alcohol use 8.4 oz/week  14 Cans of beer per week  Comment: 2 cans beer per day  . Drug use: No   FamHX:  Father died from CHF at 57 Mother alive no heart problems H/o colon CA on Moms side 1 sister who is fine   Vitals:   11/02/17 1107  BP: (!) 96/58  Pulse: 62  SpO2: 96%  Weight: 165 lb 4 oz (75 kg)   Wt Readings from Last 3 Encounters:  11/02/17 165 lb 4 oz (75 kg)  08/14/17 170 lb 6.4 oz (77.3 kg)  07/27/17 171 lb 6.4 oz (77.7 kg)    PHYSICAL EXAM: General:  Well appearing. No resp difficulty HEENT: normal Neck: supple. no JVD. Carotids 2+ bilat; no bruits. No lymphadenopathy or thryomegaly appreciated. Cor: PMI nondisplaced. Regular rate & rhythm. No rubs, gallops or murmurs. Lungs: clear Abdomen: soft, nontender, nondistended. No hepatosplenomegaly. No bruits or masses. Good bowel sounds. Extremities: no cyanosis, clubbing, rash, edema Neuro: alert & orientedx3, cranial nerves grossly intact. moves all 4 extremities w/o difficulty. Affect pleasant  ASSESSMENT & PLAN:  1. Chronic systolic HF - Echo 73/53: EF 25-30%  - NYHA II but having some fatigue and poor appetite. May need set up RHC to evaluate hemodynamics.  Volume status stable. Does not need diuretics.  -- Continue spiro 12.5mg  daily - Cut back entresto to 24-26 mg and may need to switch back to losartan if dizziness persists.   - Continue carvedilol 6.25 BID.  -Check BMET today.   2. CAD s/p CABG -No s/s ischemia   - Continue statin, b-blocker and ASA (with mechanical valves will not stop ASA in presence of warfarin)  3. S/p  mechanical AVR/MVR - Continue ASA and coumadin. - No bleeding issues.   - Reminded him of need for SBE prophylaxis   4. Post-op AF -No A Fib on interrogation.    5. Frequent PVCs -Has [employer] ICD. Obtain CXR because he feels like his device has  moved.  - He has been on amiodarone 200 mg twice a day for the months.  - TSH /LFTs followed by PCP.  - Cut back amio to 200 mg daily.   6. Essential thrombocytosis - Continue hydroxyurea - Follow up with Hematology  7. AAA - CT scan 12/18 3.7cm - Will do u/s in 12/19 to reassess  Set up follow up with EP and 5 weeks with Dr Haroldine Laws. I have asked him to call HF clinic his symptoms do not improve.   Greater than 50% of the 25 minute visit was spent in counseling/coordination of care regarding disease state education, salt/fluid restriction, sliding scale diuretics, and medication compliance.   Darrick Grinder, NP  11:36 AM

## 2017-11-02 NOTE — Patient Instructions (Addendum)
Routine lab work today. Will notify you of abnormal results, otherwise no news is good news!  DECREASE Amiodarone to 200 mg tablet ONCE daily.  DECREASE Entresto to 24-26 mg tablet twice daily. New prescription has been sent to your pharmacy for 24-26 mg tablets. You CANNOT half your current 49-51 mg tablets.  Chest xray today to check placement of ICD. We will call you with results later today.  Follow up with Dr. Caryl Comes or his NP Taylor [name].  Follow up with Dr. Haroldine Laws 6 weeks.  ______________________________________________________________________ Taylor [name] Code: 5400  Take all medication as prescribed the day of your appointment. Bring all medications with you to your appointment.  Do the following things EVERYDAY: 1) Weigh yourself in the morning before breakfast. Write it down and keep it in a log. 2) Take your medicines as prescribed 3) Eat low salt foods-Limit salt (sodium) to 2000 mg per day.  4) Stay as active as you can everyday 5) Limit all fluids for the day to less than 2 liters

## 2017-11-08 ENCOUNTER — Ambulatory Visit (INDEPENDENT_AMBULATORY_CARE_PROVIDER_SITE_OTHER): Payer: BLUE CROSS/BLUE SHIELD | Admitting: *Deleted

## 2017-11-08 DIAGNOSIS — I5022 Chronic systolic (congestive) heart failure: Secondary | ICD-10-CM

## 2017-11-08 DIAGNOSIS — I480 Paroxysmal atrial fibrillation: Secondary | ICD-10-CM | POA: Diagnosis not present

## 2017-11-08 NOTE — Progress Notes (Signed)
Remote ICD transmission.   

## 2017-11-15 ENCOUNTER — Ambulatory Visit (INDEPENDENT_AMBULATORY_CARE_PROVIDER_SITE_OTHER): Payer: BLUE CROSS/BLUE SHIELD | Admitting: *Deleted

## 2017-11-15 DIAGNOSIS — Z5181 Encounter for therapeutic drug level monitoring: Secondary | ICD-10-CM

## 2017-11-15 DIAGNOSIS — I5022 Chronic systolic (congestive) heart failure: Secondary | ICD-10-CM | POA: Diagnosis not present

## 2017-11-15 DIAGNOSIS — Z952 Presence of prosthetic heart valve: Secondary | ICD-10-CM

## 2017-11-15 LAB — POCT INR: INR: 1.5 — AB (ref 2.0–3.0)

## 2017-11-15 NOTE — Patient Instructions (Signed)
Description   Today and tomorrow take 1 tablet then continue on same dosage 1/2 tablet daily except 1 tablet on Sundays. Continue eating green leafy veggies twice a week.  Recheck INR in 2 weeks after returning to town. Coumadin Clinic [phone] Main [phone] Taking Amiodarone 200mg  BID per pt report.

## 2017-11-27 ENCOUNTER — Other Ambulatory Visit: Payer: Self-pay | Admitting: Internal Medicine

## 2017-11-30 ENCOUNTER — Ambulatory Visit (INDEPENDENT_AMBULATORY_CARE_PROVIDER_SITE_OTHER): Payer: BLUE CROSS/BLUE SHIELD | Admitting: *Deleted

## 2017-11-30 ENCOUNTER — Ambulatory Visit (HOSPITAL_COMMUNITY)
Admission: RE | Admit: 2017-11-30 | Discharge: 2017-11-30 | Disposition: A | Discharge status: Home / Self Care | Payer: Medicare Other | Source: Ambulatory Visit | Attending: Internal Medicine | Admitting: Internal Medicine

## 2017-11-30 VITALS — BP 110/64 | HR 60 | Wt 166.8 lb

## 2017-11-30 DIAGNOSIS — Z7901 Long term (current) use of anticoagulants: Secondary | ICD-10-CM | POA: Insufficient documentation

## 2017-11-30 DIAGNOSIS — Z952 Presence of prosthetic heart valve: Secondary | ICD-10-CM

## 2017-11-30 DIAGNOSIS — Z951 Presence of aortocoronary bypass graft: Secondary | ICD-10-CM | POA: Insufficient documentation

## 2017-11-30 DIAGNOSIS — Z5181 Encounter for therapeutic drug level monitoring: Secondary | ICD-10-CM

## 2017-11-30 DIAGNOSIS — Z7982 Long term (current) use of aspirin: Secondary | ICD-10-CM | POA: Insufficient documentation

## 2017-11-30 DIAGNOSIS — I480 Paroxysmal atrial fibrillation: Secondary | ICD-10-CM | POA: Diagnosis not present

## 2017-11-30 DIAGNOSIS — I5022 Chronic systolic (congestive) heart failure: Secondary | ICD-10-CM | POA: Insufficient documentation

## 2017-11-30 DIAGNOSIS — I714 Abdominal aortic aneurysm, without rupture: Secondary | ICD-10-CM | POA: Insufficient documentation

## 2017-11-30 DIAGNOSIS — Z87891 Personal history of nicotine dependence: Secondary | ICD-10-CM | POA: Insufficient documentation

## 2017-11-30 DIAGNOSIS — I2510 Atherosclerotic heart disease of native coronary artery without angina pectoris: Secondary | ICD-10-CM | POA: Diagnosis not present

## 2017-11-30 DIAGNOSIS — I493 Ventricular premature depolarization: Secondary | ICD-10-CM | POA: Insufficient documentation

## 2017-11-30 DIAGNOSIS — I252 Old myocardial infarction: Secondary | ICD-10-CM | POA: Insufficient documentation

## 2017-11-30 DIAGNOSIS — Z95810 Presence of automatic (implantable) cardiac defibrillator: Secondary | ICD-10-CM

## 2017-11-30 DIAGNOSIS — Z8674 Personal history of sudden cardiac arrest: Secondary | ICD-10-CM | POA: Diagnosis not present

## 2017-11-30 DIAGNOSIS — D473 Essential (hemorrhagic) thrombocythemia: Secondary | ICD-10-CM | POA: Diagnosis not present

## 2017-11-30 DIAGNOSIS — Z4502 Encounter for adjustment and management of automatic implantable cardiac defibrillator: Secondary | ICD-10-CM | POA: Diagnosis not present

## 2017-11-30 DIAGNOSIS — F1010 Alcohol abuse, uncomplicated: Secondary | ICD-10-CM | POA: Insufficient documentation

## 2017-11-30 DIAGNOSIS — Z79899 Other long term (current) drug therapy: Secondary | ICD-10-CM | POA: Insufficient documentation

## 2017-11-30 DIAGNOSIS — Z881 Allergy status to other antibiotic agents status: Secondary | ICD-10-CM | POA: Diagnosis not present

## 2017-11-30 LAB — POCT INR: INR: 2.2 (ref 2.0–3.0)

## 2017-11-30 LAB — CUP PACEART REMOTE DEVICE CHECK
Brady Statistic RV Percent Paced: 82 %
Date Time Interrogation Session: 20190613044100
HighPow Impedance: 63 Ohm
Implantable Lead Implant Date: 20180105
Implantable Lead Location: 753859
Implantable Lead Location: 753860
Implantable Lead Model: 4672
Implantable Lead Model: 7741
Implantable Lead Serial Number: 849519
Implantable Pulse Generator Implant Date: 20180105
Lead Channel Impedance Value: 523 Ohm
Lead Channel Impedance Value: 630 Ohm
Lead Channel Pacing Threshold Amplitude: 0.6 V
Lead Channel Pacing Threshold Amplitude: 0.7 V
Lead Channel Pacing Threshold Pulse Width: 0.4 ms
Lead Channel Pacing Threshold Pulse Width: 0.4 ms
Lead Channel Setting Pacing Amplitude: 2 V
Lead Channel Setting Pacing Amplitude: 2.2 V
Lead Channel Setting Pacing Pulse Width: 0.4 ms
MDC IDC LEAD IMPLANT DT: 20180105
MDC IDC LEAD IMPLANT DT: 20180105
MDC IDC LEAD LOCATION: 753858
MDC IDC LEAD SERIAL: 428973
MDC IDC LEAD SERIAL: 801088
MDC IDC MSMT BATTERY REMAINING LONGEVITY: 96 mo
MDC IDC MSMT BATTERY REMAINING PERCENTAGE: 100 %
MDC IDC MSMT LEADCHNL LV PACING THRESHOLD AMPLITUDE: 1 V
MDC IDC MSMT LEADCHNL LV PACING THRESHOLD PULSEWIDTH: 1 ms
MDC IDC MSMT LEADCHNL RA IMPEDANCE VALUE: 725 Ohm
MDC IDC PG SERIAL: 180139
MDC IDC SET LEADCHNL LV PACING PULSEWIDTH: 1 ms
MDC IDC SET LEADCHNL LV SENSING SENSITIVITY: 1 mV
MDC IDC SET LEADCHNL RA PACING AMPLITUDE: 2 V
MDC IDC SET LEADCHNL RV SENSING SENSITIVITY: 0.6 mV
MDC IDC STAT BRADY RA PERCENT PACED: 37 %

## 2017-11-30 NOTE — Progress Notes (Signed)
Advanced Heart Failure Clinic Note   Primary Cardiologist: Martin Majestic  PCP: Dr. Marveen Reeks ([place], [place]) HF MD: Dr Vaughan Browner  EP: Dr Leandro Reasoner   HPI: Taylor [name] is a 67 y.o. male with mechnical AVR/MVR, CAD s/p CABG, systolic HF and essential thrombosis.   Has h/o congenital AoV abnormality with AI. In 1994 had MI and underwent PCI (unclear artery) and mechanical AVR at [facility].   Has h/o polysubstance abuse with cocaine, ETOH and tobacco abuse. No longer doing cocaine or smoking. Now drinks about 2 beers per day.  Did well from heart perspective until 2017. When he had VF arrest on 05/18/16. Found to have 3v CAD and MV disease and underwent CABG x 3 with mechanical MVR. Post-operatively had AF (treated with amio) and CHB requiring implantation of a [employer] CRT-D device.   He presents today for regular follow up. Last visit Entresto cut back down to 24/26 mg BID due to dizziness. Still getting "dizzy spells" from time to time but much less frequent. Only a couple in the past 2 months though, with so specific trigger. No syncope or presyncope.  Denies SOB/PND/Orthopnea. No CP. Denies bleeding, BRBPR, or melena recently. Did have some streaking on his toilet paper with hard BMs about 5-6 weeks ago. Taking all medications as directed. Appetite is "fair". Drinking about 4 beers daily.   Echo on 12/18 showed LVEF of 25-30% stable AVR/MVR  Had CT on 12/18 for his back. Showed 3.7cm AAA.   [employer] CRT-D: No VT/VF. Activity 1.5 hr/day.   Review of systems complete and found to be negative unless listed in HPI.    PMHx: As per HPI.   Current Outpatient Medications  Medication Sig Dispense Refill  . amiodarone (PACERONE) 200 MG tablet Take 1 tablet (200 mg total) by mouth daily. 30 tablet 11  . aspirin 81 MG tablet Take 1 tablet by mouth.    Marland Kitchen atorvastatin (LIPITOR) 40 MG tablet Take 1 tablet (40 mg total) by mouth daily. 30 tablet 11  . carvedilol (COREG) 6.25 MG tablet Take 6.25 mg by  mouth 2 (two) times daily as needed.    . hydroxyurea (HYDREA) 500 MG capsule TAKE 1 CAPSULE BY MOUTH ALTERNATING WITH 2 CAPSULES DAILY(500 MG ONE DAY AND 1000 MG NEXT DAY). 135 capsule 3  . levothyroxine (SYNTHROID, LEVOTHROID) 25 MCG tablet Take 50 mcg by mouth daily.    . Probiotic Product (PROBIOTIC DAILY PO) Take by mouth.    . sacubitril-valsartan (ENTRESTO) 24-26 MG Take 1 tablet by mouth 2 (two) times daily. 60 tablet 11  . spironolactone (ALDACTONE) 25 MG tablet Take 0.5 tablets (12.5 mg total) by mouth daily. 15 tablet 6  . warfarin (COUMADIN) 5 MG tablet Take as directed by coumadin clinic 30 tablet 0   No current facility-administered medications for this encounter.     Allergies  Allergen Reactions  . Zithromax [Azithromycin] Rash      Social History   Socioeconomic History  . Marital status: Married    Spouse name: Not on file  . Number of children: Not on file  . Years of education: Not on file  . Highest education level: Not on file  Occupational History  . Not on file  Social Needs  . Financial resource strain: Not on file  . Food insecurity:    Worry: Not on file    Inability: Not on file  . Transportation needs:    Medical: Not on file    Non-medical: Not on file  Tobacco Use  . Smoking status: Former Smoker    Packs/day: 1.00    Years: 30.00    Pack years: 30.00    Types: Cigarettes    Start date: 05/26/1979    Last attempt to quit: 05/18/2016    Years since quitting: 1.5  . Smokeless tobacco: Never Used  Substance and Sexual Activity  . Alcohol use: Yes    Alcohol/week: 8.4 oz    Types: 14 Cans of beer per week    Comment: 2 cans a day..  . Drug use: No  . Sexual activity: Not on file  Lifestyle  . Physical activity:    Days per week: Not on file    Minutes per session: Not on file  . Stress: Not on file  Relationships  . Social connections:    Talks on phone: Not on file    Gets together: Not on file    Attends religious service: Not  on file    Active member of club or organization: Not on file    Attends meetings of clubs or organizations: Not on file    Relationship status: Not on file  . Intimate partner violence:    Fear of current or ex partner: Not on file    Emotionally abused: Not on file    Physically abused: Not on file    Forced sexual activity: Not on file  Other Topics Concern  . Not on file  Social History Narrative  . Not on file   SocHX: . Smoking status: Former Smoker  Packs/day: 1.00  Years: 33.00  Types: Cigarettes  Quit date: 05/17/2016  . Smokeless tobacco: Never Used  . Alcohol use 8.4 oz/week  14 Cans of beer per week  Comment: 2 cans beer per day  . Drug use: No   FamHX:  Father died from CHF at 25 Mother alive no heart problems H/o colon CA on Moms side 1 sister who is fine   Vitals:   11/30/17 1212  BP: 110/64  Pulse: 60  SpO2: 96%  Weight: 166 lb 12.8 oz (75.7 kg)   Wt Readings from Last 3 Encounters:  11/30/17 166 lb 12.8 oz (75.7 kg)  11/02/17 165 lb 4 oz (75 kg)  08/14/17 170 lb 6.4 oz (77.3 kg)    PHYSICAL EXAM: General: Well appearing. No resp difficulty. HEENT: Normal anicteric  Neck: Supple. JVP 5-6. Carotids 2+ bilat; no bruits. No thyromegaly or nodule noted. Cor: PMI nondisplaced. RRR, No M/G/R noted Mechanical s1s2 crisp Lungs: CTAB, normal effort. Mildly decreased BS  Abdomen: Soft, non-tender, non-distended, no HSM. No bruits or masses. +BS  Extremities: No cyanosis, clubbing, or rash. R and LLE no edema.  Neuro: Alert & orientedx3, cranial nerves grossly intact. moves all 4 extremities w/o difficulty. Affect pleasant   ASSESSMENT & PLAN:  1. Chronic systolic HF due to iCM - Echo 12/18: EF 25-30%  - NYHA II symptoms - Volume status stable on exam. Not on diuretics.  - Continue spiro 12.5mg  daily - Continue entresto 24-26 mg BID.  - Continue carvedilol 6.25 BID.  - Reinforced fluid restriction to < 2 L daily, sodium restriction to less than  2000 mg daily, and the importance of daily weights.    2. CAD s/p CABG - No s/s of ischemia.    - Continue statin, b-blocker and ASA (with mechanical valves will not stop ASA in presence of warfarin)  3. S/p mechanical AVR/MVR - Continue ASA and coumadin. - Denies bleeding.  -  Reminded him of need for SBE prophylaxis   4. Post-op AF - No A Fib on ICD interrogations.   5. Frequent PVCs - Has [employer] CRT-D. At last visit he felt as if it had moved. CXR stable.  - Taking amiodarone 200 mg q am and 100 mg q pm. Should stick to 200 mg daily.  - TSH/LFTs per PCP.  - PVC burden on device < 1% today  6. Essential thrombocytosis - Continue hydroxyurea - Follow up with Hematology.  - No change to current plan.    7. AAA - CT scan 12/18 3.7cm - Will do u/s in 12/19 to reassess  8. ETOH abuse - Drinking 4 beers daily. - Encouraged cessation.  Shirley Friar, PA-C  12:43 PM   Patient seen and examined with the above-signed Advanced Practice Provider and/or Housestaff. I personally reviewed laboratory data, imaging studies and relevant notes. I independently examined the patient and formulated the important aspects of the plan. I have edited the note to reflect any of my changes or salient points. I have personally discussed the plan with the patient and/or family.  Patient seen and examined with the above-signed Advanced Practice Provider and/or Housestaff. I personally reviewed laboratory data, imaging studies and relevant notes. I independently examined the patient and formulated the important aspects of the plan. I have edited the note to reflect any of my changes or salient points. I have personally discussed the plan with the patient and/or family.  Overall doing well. NYHA II. Volume status looks good. No evidence of angina. PVC suppressed with amio. ICD interrogated personally. No VT/VF.  Tolerating coumadin without problem. Counseled on need to limit ETOH intake.  Aware of need for SBE prophylaxis with procedures. Unable to titrrate HF meds farther at this time due to intolerance.   Glori Bickers, MD  7:22 PM

## 2017-11-30 NOTE — Patient Instructions (Signed)
Description   Continue on same dosage 1/2 tablet daily except 1 tablet on Sundays. Continue eating green leafy veggies twice a week.  Recheck INR in 3 weeks. Coumadin Clinic [phone] Main [phone] Taking Amiodarone 200mg  BID per pt report.

## 2017-11-30 NOTE — Patient Instructions (Addendum)
We will contact you in 6 months to schedule your next appointment and echocardiogram  

## 2017-12-06 ENCOUNTER — Other Ambulatory Visit (INDEPENDENT_AMBULATORY_CARE_PROVIDER_SITE_OTHER): Payer: BLUE CROSS/BLUE SHIELD

## 2017-12-06 ENCOUNTER — Encounter (INDEPENDENT_AMBULATORY_CARE_PROVIDER_SITE_OTHER): Payer: Self-pay

## 2017-12-06 DIAGNOSIS — D473 Essential (hemorrhagic) thrombocythemia: Secondary | ICD-10-CM

## 2017-12-07 ENCOUNTER — Telehealth: Payer: Self-pay | Admitting: *Deleted

## 2017-12-07 LAB — CBC WITH DIFFERENTIAL/PLATELET
BASOS ABS: 0 10*3/uL (ref 0.0–0.2)
Basos: 1 %
EOS (ABSOLUTE): 0.1 10*3/uL (ref 0.0–0.4)
Eos: 1 %
HEMOGLOBIN: 13.2 g/dL (ref 13.0–17.7)
Hematocrit: 37.1 % — ABNORMAL LOW (ref 37.5–51.0)
IMMATURE GRANS (ABS): 0 10*3/uL (ref 0.0–0.1)
IMMATURE GRANULOCYTES: 0 %
LYMPHS: 19 %
Lymphocytes Absolute: 0.7 10*3/uL (ref 0.7–3.1)
MCH: 39.1 pg — ABNORMAL HIGH (ref 26.6–33.0)
MCHC: 35.6 g/dL (ref 31.5–35.7)
MCV: 110 fL — ABNORMAL HIGH (ref 79–97)
MONOCYTES: 8 %
Monocytes Absolute: 0.3 10*3/uL (ref 0.1–0.9)
NEUTROS PCT: 71 %
Neutrophils Absolute: 2.7 10*3/uL (ref 1.4–7.0)
Platelets: 415 10*3/uL (ref 150–450)
RBC: 3.38 x10E6/uL — AB (ref 4.14–5.80)
RDW: 15.2 % (ref 12.3–15.4)
WBC: 3.8 10*3/uL (ref 3.4–10.8)

## 2017-12-07 NOTE — Telephone Encounter (Signed)
-----   Message from Annia Belt, MD sent at 12/07/2017  7:40 AM EDT ----- Please call pt: blood counts stable. Platelets 452,000. Stay on same dose of Hydrea

## 2017-12-07 NOTE — Telephone Encounter (Signed)
Pt called / informed "blood counts stable. Platelets 452,000. Stay on same dose of Hydrea" per Dr Beryle Beams. Voiced understanding. Asked about next appt to see Dr Darnell Level which should be in Sept. - told I will have [place] to call him.

## 2017-12-14 ENCOUNTER — Telehealth: Payer: Self-pay | Admitting: Family Medicine

## 2017-12-14 ENCOUNTER — Other Ambulatory Visit (HOSPITAL_COMMUNITY): Payer: Self-pay | Admitting: Internal Medicine

## 2017-12-20 ENCOUNTER — Ambulatory Visit (INDEPENDENT_AMBULATORY_CARE_PROVIDER_SITE_OTHER): Payer: Medicare Other | Admitting: Pharmacist

## 2017-12-20 DIAGNOSIS — Z952 Presence of prosthetic heart valve: Secondary | ICD-10-CM

## 2017-12-20 DIAGNOSIS — I5022 Chronic systolic (congestive) heart failure: Secondary | ICD-10-CM

## 2017-12-20 DIAGNOSIS — Z5181 Encounter for therapeutic drug level monitoring: Secondary | ICD-10-CM | POA: Diagnosis not present

## 2017-12-20 LAB — POCT INR: INR: 2.3 (ref 2.0–3.0)

## 2017-12-20 NOTE — Patient Instructions (Signed)
Description   Continue on same dosage 1/2 tablet daily except 1 tablet on Sundays. Continue eating green leafy veggies twice a week.  Recheck INR in 5 weeks. Coumadin Clinic [phone] Main [phone]

## 2018-01-24 ENCOUNTER — Ambulatory Visit (INDEPENDENT_AMBULATORY_CARE_PROVIDER_SITE_OTHER): Payer: Medicare Other | Admitting: *Deleted

## 2018-01-24 ENCOUNTER — Other Ambulatory Visit (HOSPITAL_COMMUNITY): Payer: Self-pay | Admitting: Cardiology

## 2018-01-24 DIAGNOSIS — I5022 Chronic systolic (congestive) heart failure: Secondary | ICD-10-CM

## 2018-01-24 DIAGNOSIS — Z952 Presence of prosthetic heart valve: Secondary | ICD-10-CM | POA: Diagnosis not present

## 2018-01-24 DIAGNOSIS — Z5181 Encounter for therapeutic drug level monitoring: Secondary | ICD-10-CM | POA: Diagnosis not present

## 2018-01-24 LAB — POCT INR: INR: 3.8 — AB (ref 2.0–3.0)

## 2018-01-24 MED ORDER — LEVOTHYROXINE SODIUM 25 MCG PO TABS: 50.0000 ug | ORAL_TABLET | Freq: Every day | ORAL | 1 refills | Status: DC

## 2018-01-24 MED ORDER — WARFARIN SODIUM 5 MG PO TABS: ORAL_TABLET | ORAL | 2 refills | Status: DC

## 2018-01-24 NOTE — Telephone Encounter (Signed)
Pt requested refills of coumadin and levothyroxine, advised [facility] coumadin clinic will address coumadin as they manage his inr and will refill medication accordingly. Also will only refill levothyroxine x 1, additional should come from PCP   Patient voiced understanding

## 2018-01-24 NOTE — Patient Instructions (Signed)
Description   Skip today's dose, then Continue on same dosage 1/2 tablet daily except 1 tablet on Sundays. Continue eating green leafy veggies twice a week.  Recheck INR in 2 weeks. Coumadin Clinic [phone] Main [phone]

## 2018-02-07 ENCOUNTER — Ambulatory Visit (INDEPENDENT_AMBULATORY_CARE_PROVIDER_SITE_OTHER): Payer: Medicare Other

## 2018-02-07 ENCOUNTER — Ambulatory Visit (INDEPENDENT_AMBULATORY_CARE_PROVIDER_SITE_OTHER): Payer: Medicare Other | Admitting: *Deleted

## 2018-02-07 ENCOUNTER — Other Ambulatory Visit (INDEPENDENT_AMBULATORY_CARE_PROVIDER_SITE_OTHER): Payer: Medicare Other

## 2018-02-07 DIAGNOSIS — Z5181 Encounter for therapeutic drug level monitoring: Secondary | ICD-10-CM | POA: Diagnosis not present

## 2018-02-07 DIAGNOSIS — Z952 Presence of prosthetic heart valve: Secondary | ICD-10-CM

## 2018-02-07 DIAGNOSIS — D473 Essential (hemorrhagic) thrombocythemia: Secondary | ICD-10-CM

## 2018-02-07 DIAGNOSIS — I5022 Chronic systolic (congestive) heart failure: Secondary | ICD-10-CM

## 2018-02-07 DIAGNOSIS — Z95810 Presence of automatic (implantable) cardiac defibrillator: Secondary | ICD-10-CM

## 2018-02-07 LAB — POCT INR: INR: 3.1 — AB (ref 2.0–3.0)

## 2018-02-07 NOTE — Patient Instructions (Signed)
Description   Skip today's dose, then resume same dosage 1/2 tablet daily except 1 tablet on Sundays. Continue eating green leafy veggies twice a week.  Recheck INR in 3 weeks. Coumadin Clinic [phone] Main [phone]

## 2018-02-07 NOTE — Progress Notes (Signed)
Remote ICD transmission.   

## 2018-02-08 ENCOUNTER — Telehealth: Payer: Self-pay | Admitting: *Deleted

## 2018-02-08 LAB — CBC WITH DIFFERENTIAL/PLATELET
BASOS ABS: 0 10*3/uL (ref 0.0–0.2)
Basos: 1 %
EOS (ABSOLUTE): 0.1 10*3/uL (ref 0.0–0.4)
Eos: 1 %
Hematocrit: 35 % — ABNORMAL LOW (ref 37.5–51.0)
Hemoglobin: 12.6 g/dL — ABNORMAL LOW (ref 13.0–17.7)
IMMATURE GRANULOCYTES: 1 %
Immature Grans (Abs): 0 10*3/uL (ref 0.0–0.1)
LYMPHS ABS: 0.7 10*3/uL (ref 0.7–3.1)
Lymphs: 13 %
MCH: 39 pg — ABNORMAL HIGH (ref 26.6–33.0)
MCHC: 36 g/dL — AB (ref 31.5–35.7)
MCV: 108 fL — ABNORMAL HIGH (ref 79–97)
MONOCYTES: 9 %
MONOS ABS: 0.5 10*3/uL (ref 0.1–0.9)
NEUTROS PCT: 75 %
Neutrophils Absolute: 4 10*3/uL (ref 1.4–7.0)
Platelets: 440 10*3/uL (ref 150–450)
RBC: 3.23 x10E6/uL — AB (ref 4.14–5.80)
RDW: 15.7 % — AB (ref 12.3–15.4)
WBC: 5.3 10*3/uL (ref 3.4–10.8)

## 2018-02-08 NOTE — Telephone Encounter (Signed)
Pt called / informed "platelet count stable @ 440,000. Stay on same dose Hydrea " per Dr Beryle Beams. Verbalized understanding.

## 2018-02-08 NOTE — Telephone Encounter (Signed)
-----   Message from Annia Belt, MD sent at 02/08/2018  9:51 AM EDT ----- Call pt: platelet count stable @ 440,000. Stay on same dose Hydrea

## 2018-02-11 ENCOUNTER — Encounter: Payer: Medicare Other | Admitting: Oncology

## 2018-02-18 ENCOUNTER — Ambulatory Visit (INDEPENDENT_AMBULATORY_CARE_PROVIDER_SITE_OTHER): Payer: Medicare Other | Admitting: Oncology

## 2018-02-18 ENCOUNTER — Encounter: Payer: Self-pay | Admitting: Oncology

## 2018-02-18 ENCOUNTER — Other Ambulatory Visit: Payer: Self-pay

## 2018-02-18 VITALS — BP 99/59 | HR 62 | Temp 97.8°F | Ht 69.0 in | Wt 164.6 lb

## 2018-02-18 DIAGNOSIS — D126 Benign neoplasm of colon, unspecified: Secondary | ICD-10-CM | POA: Diagnosis not present

## 2018-02-18 DIAGNOSIS — Z79899 Other long term (current) drug therapy: Secondary | ICD-10-CM | POA: Diagnosis not present

## 2018-02-18 DIAGNOSIS — Z951 Presence of aortocoronary bypass graft: Secondary | ICD-10-CM

## 2018-02-18 DIAGNOSIS — Z952 Presence of prosthetic heart valve: Secondary | ICD-10-CM

## 2018-02-18 DIAGNOSIS — Z8674 Personal history of sudden cardiac arrest: Secondary | ICD-10-CM | POA: Diagnosis not present

## 2018-02-18 DIAGNOSIS — Z7901 Long term (current) use of anticoagulants: Secondary | ICD-10-CM | POA: Diagnosis not present

## 2018-02-18 DIAGNOSIS — Z85828 Personal history of other malignant neoplasm of skin: Secondary | ICD-10-CM

## 2018-02-18 DIAGNOSIS — M5416 Radiculopathy, lumbar region: Secondary | ICD-10-CM

## 2018-02-18 DIAGNOSIS — F1511 Other stimulant abuse, in remission: Secondary | ICD-10-CM | POA: Diagnosis not present

## 2018-02-18 DIAGNOSIS — Z87891 Personal history of nicotine dependence: Secondary | ICD-10-CM | POA: Diagnosis not present

## 2018-02-18 DIAGNOSIS — I714 Abdominal aortic aneurysm, without rupture: Secondary | ICD-10-CM

## 2018-02-18 DIAGNOSIS — I252 Old myocardial infarction: Secondary | ICD-10-CM

## 2018-02-18 DIAGNOSIS — F1411 Cocaine abuse, in remission: Secondary | ICD-10-CM

## 2018-02-18 DIAGNOSIS — K5730 Diverticulosis of large intestine without perforation or abscess without bleeding: Secondary | ICD-10-CM

## 2018-02-18 DIAGNOSIS — Z7982 Long term (current) use of aspirin: Secondary | ICD-10-CM | POA: Diagnosis not present

## 2018-02-18 DIAGNOSIS — I2510 Atherosclerotic heart disease of native coronary artery without angina pectoris: Secondary | ICD-10-CM

## 2018-02-18 DIAGNOSIS — M5440 Lumbago with sciatica, unspecified side: Secondary | ICD-10-CM

## 2018-02-18 DIAGNOSIS — D473 Essential (hemorrhagic) thrombocythemia: Secondary | ICD-10-CM

## 2018-02-18 DIAGNOSIS — Z881 Allergy status to other antibiotic agents status: Secondary | ICD-10-CM | POA: Diagnosis not present

## 2018-02-18 DIAGNOSIS — Z95810 Presence of automatic (implantable) cardiac defibrillator: Secondary | ICD-10-CM

## 2018-02-18 DIAGNOSIS — G8929 Other chronic pain: Secondary | ICD-10-CM

## 2018-02-18 HISTORY — DX: Long term (current) use of anticoagulants: Z79.01

## 2018-02-18 NOTE — Progress Notes (Signed)
Hematology and Oncology Follow Up Visit  Taylor [name] [id number] 18-May-1951 67 y.o. 02/18/2018 9:51 AM   Principle Diagnosis: Encounter Diagnoses  Name Primary?  . Essential thrombocythemia ([place]) Yes  . Aortic valve replaced   . Chronic anticoagulation   Clinical summary: 67 year old man who established care with me in September 2018 for ongoing treatment of essential thrombocythemia.  Diagnosed in approximately January 2009 when he presented with recurrent episodes of memory loss and dizziness.  MRI with probable lacunar infarcts in the right thalamus and left caudate body.  Elevated platelet count noted at that time.  He was referred to a hematologist and started on aspirin plus hydroxyurea.  Neurologic symptoms resolved. JAK-2 mutation status not known to the patient.  He does remember having a bone marrow biopsy at time of diagnosis.  He has been on a stable dose of Hydrea 500 mg daily alternating with 1000 mg. He has a mechanical aortic valve on chronic Coumadin anticoagulation.  Status post ICD/pacemaker.  Multivessel coronary disease.  History of a non-ST wave elevation MI December 2017.  Former substance abuse with cocaine and benzodiazepines leading to a nonischemic cardiomyopathy and possibly related to aortic valve failure.  Interim History:  Overall stable since last visit.  He has been having a flareup of his chronic sciatic pain.  Probable lumbar radiculopathy on December 2018 CT scan. He appears to have lost some weight.  He states that he has lost about 6 pounds.  Appetite is decreased.  No other constitutional symptoms.  No change in bowel habit.  He states colonoscopy done 1 year ago showed some diverticulosis and some benign polyps otherwise normal.  He denies any hematuria. He had some recent problems with the monitoring system for his ICD/pacemaker device.  He got a new monitor and now getting appropriate feedback from the electronic system.  No current chest pain, dyspnea, or  palpitations.  He remains on chronic amiodarone.  New medication since his last visit with me is Entresto.  He is only able to tolerate 1 pill a day and not a twice daily dose which makes him dizzy. Blood counts on chronic Hydrea dose remained stable.  Platelet counts consistently less than 450,000. Recent rash on the right temple area.  Seen by dermatologist.  Treated with a topical cream.  A small skin cancer was removed from his scalp.  Medications: reviewed  Allergies:  Allergies  Allergen Reactions  . Zithromax [Azithromycin] Rash    Review of Systems: See interim history remaining ROS negative:   Physical Exam: Blood pressure (!) 99/59, pulse 62, temperature 97.8 F (36.6 C), temperature source Oral, height '5\' 9"'$  (1.753 m), weight 164 lb 9.6 oz (74.7 kg), SpO2 99 %. Wt Readings from Last 3 Encounters:  02/18/18 164 lb 9.6 oz (74.7 kg)  11/30/17 166 lb 12.8 oz (75.7 kg)  11/02/17 165 lb 4 oz (75 kg)     General appearance: Thin Caucasian man HENNT: Pharynx no erythema, exudate, mass, or ulcer. No thyromegaly or thyroid nodules Lymph nodes: No cervical, supraclavicular, or axillary lymphadenopathy Breasts:  Lungs: Clear to auscultation, resonant to percussion throughout Heart: Regular rhythm, mechanical heart valve sounds, no obvious murmur, no edema Abdomen: Soft, nontender, normal bowel sounds, no mass, no organomegaly Extremities: No edema, no calf tenderness Musculoskeletal: no joint deformities GU:  Vascular: Carotid pulses 2+, no bruits,  Neurologic: Alert, oriented, PERRLA, I was unable to visualize the optic discs , cranial nerves grossly normal, motor strength 5 over 5, reflexes 1+ symmetric,  upper body coordination normal, gait normal, Skin: Localized pustular rash on erythematous base right temple area.  No ecchymosis.  Lab Results: CBC W/Diff    Component Value Date/Time   WBC 5.3 02/07/2018 1336   WBC 3.6 (L) 07/27/2017 1143   RBC 3.23 (L) 02/07/2018 1336    RBC 3.44 (L) 07/27/2017 1143   HGB 12.6 (L) 02/07/2018 1336   HCT 35.0 (L) 02/07/2018 1336   PLT 440 02/07/2018 1336   MCV 108 (H) 02/07/2018 1336   MCH 39.0 (H) 02/07/2018 1336   MCH 38.7 (H) 07/27/2017 1143   MCHC 36.0 (H) 02/07/2018 1336   MCHC 34.6 07/27/2017 1143   RDW 15.7 (H) 02/07/2018 1336   LYMPHSABS 0.7 02/07/2018 1336   MONOABS 0.3 04/17/2017 1533   EOSABS 0.1 02/07/2018 1336   BASOSABS 0.0 02/07/2018 1336     Chemistry      Component Value Date/Time   NA 140 11/02/2017 1223   K 4.3 11/02/2017 1223   CL 109 11/02/2017 1223   CO2 24 11/02/2017 1223   BUN 19 11/02/2017 1223   CREATININE 1.03 11/02/2017 1223      Component Value Date/Time   CALCIUM 8.9 11/02/2017 1223   ALKPHOS 69 07/27/2017 1143   AST 38 07/27/2017 1143   ALT 43 07/27/2017 1143   BILITOT 0.6 07/27/2017 1143       Radiological Studies: No results found.  Impression:  1.  Myeloproliferative disorder: Essential thrombocythemia Blood counts remain stable on Hydrea 500 mg daily alternating with 1000 mg daily.  I will continue every other month lab.  I will see him one more time in January 2020 then make arrangements for follow-up hematology care at the [facility] in [facility] which is more convenient for him.  2.  Status post mechanical aortic valve replacement He continues on chronic warfarin anticoagulation target INR of 2.5-3.5  3.  Nonischemic and ischemic cardiomyopathy with known triple-vessel disease status post bypass grafting with a nonischemic component possibly related to previous substance abuse.  Status post ICD/pacemaker implant.  4.  History of ventricular arrhythmias/V. fib arrest 12/17 on chronic amiodarone.  5.  Abdominal aortic aneurysm 3.7 cm on December 2018 CT scan.  6.  Lumbar radiculopathy with chronic sciatic pain I encouraged him to make an appointment with a neurosurgeon or an orthopedic surgeon that specializes in back surgery.  CC:  Patient Care Team: Michell Heinrich, DO as PCP - General (Family Medicine) Annia Belt, MD as Consulting Physician (Oncology) Bensimhon, Shaune Pascal, MD as Consulting Physician (Cardiology)   Murriel Hopper, MD, [place]  Hematology-Oncology/Internal Medicine     9/23/20199:51 AM

## 2018-02-18 NOTE — Patient Instructions (Addendum)
Continue every 2 month CBC,diff MD visit 4 months June 17, 2018 Lab day of visit   Referral to [place] center in [place] for follow up Hematology care

## 2018-02-25 ENCOUNTER — Other Ambulatory Visit (HOSPITAL_COMMUNITY): Payer: Self-pay | Admitting: *Deleted

## 2018-02-25 MED ORDER — CARVEDILOL 6.25 MG PO TABS: 6.2500 mg | ORAL_TABLET | Freq: Two times a day (BID) | ORAL | 3 refills | Status: DC

## 2018-02-27 LAB — CUP PACEART REMOTE DEVICE CHECK
Battery Remaining Longevity: 96 mo
Battery Remaining Percentage: 100 %
HighPow Impedance: 71 Ohm
Implantable Lead Implant Date: 20180105
Implantable Lead Implant Date: 20180105
Implantable Lead Implant Date: 20180105
Implantable Lead Location: 753859
Implantable Lead Location: 753860
Implantable Lead Model: 4672
Implantable Lead Model: 7741
Implantable Lead Serial Number: 801088
Implantable Lead Serial Number: 849519
Implantable Pulse Generator Implant Date: 20180105
Lead Channel Impedance Value: 546 Ohm
Lead Channel Impedance Value: 642 Ohm
Lead Channel Impedance Value: 675 Ohm
Lead Channel Pacing Threshold Amplitude: 0.6 V
Lead Channel Pacing Threshold Amplitude: 1 V
Lead Channel Pacing Threshold Pulse Width: 1 ms
Lead Channel Setting Pacing Amplitude: 2 V
Lead Channel Setting Pacing Amplitude: 2.2 V
Lead Channel Setting Pacing Pulse Width: 0.4 ms
Lead Channel Setting Pacing Pulse Width: 1 ms
Lead Channel Setting Sensing Sensitivity: 0.6 mV
Lead Channel Setting Sensing Sensitivity: 1 mV
MDC IDC LEAD LOCATION: 753858
MDC IDC LEAD SERIAL: 428973
MDC IDC MSMT LEADCHNL RA PACING THRESHOLD AMPLITUDE: 0.7 V
MDC IDC MSMT LEADCHNL RA PACING THRESHOLD PULSEWIDTH: 0.4 ms
MDC IDC MSMT LEADCHNL RV PACING THRESHOLD PULSEWIDTH: 0.4 ms
MDC IDC PG SERIAL: 180139
MDC IDC SESS DTM: 20190912045600
MDC IDC SET LEADCHNL RA PACING AMPLITUDE: 2 V
MDC IDC STAT BRADY RA PERCENT PACED: 40 %
MDC IDC STAT BRADY RV PERCENT PACED: 84 %

## 2018-02-28 ENCOUNTER — Ambulatory Visit (INDEPENDENT_AMBULATORY_CARE_PROVIDER_SITE_OTHER): Payer: Medicare Other | Admitting: *Deleted

## 2018-02-28 DIAGNOSIS — Z952 Presence of prosthetic heart valve: Secondary | ICD-10-CM | POA: Diagnosis not present

## 2018-02-28 DIAGNOSIS — Z5181 Encounter for therapeutic drug level monitoring: Secondary | ICD-10-CM | POA: Diagnosis not present

## 2018-02-28 DIAGNOSIS — I5022 Chronic systolic (congestive) heart failure: Secondary | ICD-10-CM | POA: Diagnosis not present

## 2018-02-28 LAB — POCT INR: INR: 1.7 — AB (ref 2.0–3.0)

## 2018-02-28 NOTE — Patient Instructions (Signed)
Description   Today take 1 tablet, then resume same dosage 1/2 tablet daily except 1 tablet on Sundays. Continue eating green leafy veggies twice a week.  Recheck INR in 3 weeks. Coumadin Clinic [phone] Main [phone]

## 2018-03-15 ENCOUNTER — Other Ambulatory Visit (HOSPITAL_COMMUNITY): Payer: Self-pay | Admitting: Internal Medicine

## 2018-03-21 ENCOUNTER — Ambulatory Visit (INDEPENDENT_AMBULATORY_CARE_PROVIDER_SITE_OTHER): Payer: Medicare Other | Admitting: *Deleted

## 2018-03-21 DIAGNOSIS — Z952 Presence of prosthetic heart valve: Secondary | ICD-10-CM

## 2018-03-21 DIAGNOSIS — I5022 Chronic systolic (congestive) heart failure: Secondary | ICD-10-CM | POA: Diagnosis not present

## 2018-03-21 DIAGNOSIS — Z5181 Encounter for therapeutic drug level monitoring: Secondary | ICD-10-CM | POA: Diagnosis not present

## 2018-03-21 LAB — POCT INR: INR: 2.5 (ref 2.0–3.0)

## 2018-03-21 MED ORDER — WARFARIN SODIUM 5 MG PO TABS: ORAL_TABLET | ORAL | 3 refills | Status: DC

## 2018-03-21 NOTE — Patient Instructions (Signed)
Description   Continue taking your same dosage 1/2 tablet daily except 1 tablet on Sundays. Continue eating green leafy veggies twice a week.  Recheck INR in 4 weeks. Coumadin Clinic [phone] Main [phone]

## 2018-04-18 ENCOUNTER — Ambulatory Visit (INDEPENDENT_AMBULATORY_CARE_PROVIDER_SITE_OTHER): Payer: Medicare Other | Admitting: Pharmacist

## 2018-04-18 DIAGNOSIS — Z952 Presence of prosthetic heart valve: Secondary | ICD-10-CM

## 2018-04-18 DIAGNOSIS — I5022 Chronic systolic (congestive) heart failure: Secondary | ICD-10-CM

## 2018-04-18 DIAGNOSIS — Z5181 Encounter for therapeutic drug level monitoring: Secondary | ICD-10-CM

## 2018-04-18 LAB — POCT INR: INR: 1.8 — AB (ref 2.0–3.0)

## 2018-04-18 NOTE — Patient Instructions (Signed)
Description   Take 1 tablet today, then continue taking your same dosage 1/2 tablet daily except 1 tablet on Sundays. Continue eating green leafy veggies twice a week.  Recheck INR in 3 weeks. Coumadin Clinic [phone] Main [phone]

## 2018-04-24 ENCOUNTER — Telehealth (HOSPITAL_COMMUNITY): Payer: Self-pay

## 2018-04-24 ENCOUNTER — Other Ambulatory Visit (HOSPITAL_COMMUNITY): Payer: Self-pay

## 2018-04-24 DIAGNOSIS — I714 Abdominal aortic aneurysm, without rupture, unspecified: Secondary | ICD-10-CM

## 2018-04-24 NOTE — Telephone Encounter (Signed)
Spoke with pt about ordering him an abdominal aortic ultrasound per Bensimhon. Pt aware, agreeable and verbalizes understanding.

## 2018-04-24 NOTE — Progress Notes (Signed)
Opened in error

## 2018-04-29 ENCOUNTER — Other Ambulatory Visit (HOSPITAL_COMMUNITY): Payer: Self-pay | Admitting: Internal Medicine

## 2018-05-09 ENCOUNTER — Ambulatory Visit (INDEPENDENT_AMBULATORY_CARE_PROVIDER_SITE_OTHER): Payer: Medicare Other | Admitting: *Deleted

## 2018-05-09 ENCOUNTER — Ambulatory Visit (INDEPENDENT_AMBULATORY_CARE_PROVIDER_SITE_OTHER): Payer: Medicare Other

## 2018-05-09 DIAGNOSIS — Z952 Presence of prosthetic heart valve: Secondary | ICD-10-CM | POA: Diagnosis not present

## 2018-05-09 DIAGNOSIS — Z5181 Encounter for therapeutic drug level monitoring: Secondary | ICD-10-CM

## 2018-05-09 DIAGNOSIS — I5022 Chronic systolic (congestive) heart failure: Secondary | ICD-10-CM | POA: Diagnosis not present

## 2018-05-09 DIAGNOSIS — I4901 Ventricular fibrillation: Secondary | ICD-10-CM

## 2018-05-09 LAB — POCT INR: INR: 1.5 — AB (ref 2.0–3.0)

## 2018-05-09 NOTE — Patient Instructions (Signed)
Description   Take 1 tablet today, then change your  dosage to 1/2 tablet daily except 1 tablet on Mondays and Fridays.  Continue eating green leafy veggies twice a week.  Recheck INR in 2 weeks. Coumadin Clinic [phone] Main [phone]

## 2018-05-10 ENCOUNTER — Encounter: Payer: Self-pay | Admitting: Cardiology

## 2018-05-10 NOTE — Progress Notes (Signed)
Remote ICD transmission.   

## 2018-05-23 ENCOUNTER — Ambulatory Visit (INDEPENDENT_AMBULATORY_CARE_PROVIDER_SITE_OTHER): Payer: Medicare Other | Admitting: *Deleted

## 2018-05-23 ENCOUNTER — Telehealth (HOSPITAL_COMMUNITY): Payer: Self-pay

## 2018-05-23 ENCOUNTER — Other Ambulatory Visit (HOSPITAL_COMMUNITY): Payer: Self-pay | Admitting: Internal Medicine

## 2018-05-23 ENCOUNTER — Encounter (INDEPENDENT_AMBULATORY_CARE_PROVIDER_SITE_OTHER): Payer: Self-pay

## 2018-05-23 DIAGNOSIS — Z5181 Encounter for therapeutic drug level monitoring: Secondary | ICD-10-CM

## 2018-05-23 DIAGNOSIS — I5022 Chronic systolic (congestive) heart failure: Secondary | ICD-10-CM | POA: Diagnosis not present

## 2018-05-23 DIAGNOSIS — Z952 Presence of prosthetic heart valve: Secondary | ICD-10-CM | POA: Diagnosis not present

## 2018-05-23 LAB — POCT INR: INR: 3.1 — AB (ref 2.0–3.0)

## 2018-05-23 NOTE — Patient Instructions (Addendum)
Description   Today and tomorrow take 1/2 tablet, then continue taking 1/2 tablet daily except 1 tablet on Mondays and Fridays.  Increase your green leafy veggies to three a week.  Recheck INR in 10 days.  Coumadin Clinic [phone] Main [phone]

## 2018-05-23 NOTE — Telephone Encounter (Signed)
[employer] paper work faxed

## 2018-06-03 ENCOUNTER — Ambulatory Visit (HOSPITAL_COMMUNITY)
Admission: RE | Admit: 2018-06-03 | Discharge: 2018-06-03 | Disposition: A | Discharge status: Home / Self Care | Payer: Medicare Other | Source: Ambulatory Visit | Attending: Internal Medicine | Admitting: Internal Medicine

## 2018-06-03 ENCOUNTER — Telehealth (HOSPITAL_COMMUNITY): Payer: Self-pay

## 2018-06-03 ENCOUNTER — Encounter (HOSPITAL_COMMUNITY): Payer: Self-pay | Admitting: Internal Medicine

## 2018-06-03 ENCOUNTER — Ambulatory Visit (INDEPENDENT_AMBULATORY_CARE_PROVIDER_SITE_OTHER): Payer: Medicare Other | Admitting: *Deleted

## 2018-06-03 VITALS — BP 130/70 | HR 69 | Wt 171.2 lb

## 2018-06-03 DIAGNOSIS — Z8249 Family history of ischemic heart disease and other diseases of the circulatory system: Secondary | ICD-10-CM | POA: Insufficient documentation

## 2018-06-03 DIAGNOSIS — Z87891 Personal history of nicotine dependence: Secondary | ICD-10-CM | POA: Insufficient documentation

## 2018-06-03 DIAGNOSIS — Z7982 Long term (current) use of aspirin: Secondary | ICD-10-CM | POA: Insufficient documentation

## 2018-06-03 DIAGNOSIS — Z888 Allergy status to other drugs, medicaments and biological substances status: Secondary | ICD-10-CM | POA: Diagnosis not present

## 2018-06-03 DIAGNOSIS — I2510 Atherosclerotic heart disease of native coronary artery without angina pectoris: Secondary | ICD-10-CM | POA: Diagnosis not present

## 2018-06-03 DIAGNOSIS — I255 Ischemic cardiomyopathy: Secondary | ICD-10-CM | POA: Diagnosis not present

## 2018-06-03 DIAGNOSIS — I714 Abdominal aortic aneurysm, without rupture, unspecified: Secondary | ICD-10-CM

## 2018-06-03 DIAGNOSIS — F1010 Alcohol abuse, uncomplicated: Secondary | ICD-10-CM | POA: Insufficient documentation

## 2018-06-03 DIAGNOSIS — Z5181 Encounter for therapeutic drug level monitoring: Secondary | ICD-10-CM | POA: Diagnosis not present

## 2018-06-03 DIAGNOSIS — Z952 Presence of prosthetic heart valve: Secondary | ICD-10-CM

## 2018-06-03 DIAGNOSIS — I493 Ventricular premature depolarization: Secondary | ICD-10-CM | POA: Diagnosis not present

## 2018-06-03 DIAGNOSIS — D473 Essential (hemorrhagic) thrombocythemia: Secondary | ICD-10-CM | POA: Insufficient documentation

## 2018-06-03 DIAGNOSIS — Z79890 Hormone replacement therapy (postmenopausal): Secondary | ICD-10-CM | POA: Diagnosis not present

## 2018-06-03 DIAGNOSIS — Z881 Allergy status to other antibiotic agents status: Secondary | ICD-10-CM | POA: Insufficient documentation

## 2018-06-03 DIAGNOSIS — I5022 Chronic systolic (congestive) heart failure: Secondary | ICD-10-CM | POA: Diagnosis present

## 2018-06-03 DIAGNOSIS — Z7901 Long term (current) use of anticoagulants: Secondary | ICD-10-CM | POA: Insufficient documentation

## 2018-06-03 DIAGNOSIS — Z79899 Other long term (current) drug therapy: Secondary | ICD-10-CM | POA: Diagnosis not present

## 2018-06-03 DIAGNOSIS — Z4502 Encounter for adjustment and management of automatic implantable cardiac defibrillator: Secondary | ICD-10-CM | POA: Insufficient documentation

## 2018-06-03 DIAGNOSIS — Z951 Presence of aortocoronary bypass graft: Secondary | ICD-10-CM | POA: Insufficient documentation

## 2018-06-03 LAB — CBC
HEMATOCRIT: 38 % — AB (ref 39.0–52.0)
Hemoglobin: 13.1 g/dL (ref 13.0–17.0)
MCH: 41.2 pg — AB (ref 26.0–34.0)
MCHC: 34.5 g/dL (ref 30.0–36.0)
MCV: 119.5 fL — AB (ref 80.0–100.0)
Platelets: 385 10*3/uL (ref 150–400)
RBC: 3.18 MIL/uL — ABNORMAL LOW (ref 4.22–5.81)
RDW: 13.2 % (ref 11.5–15.5)
WBC: 4.1 10*3/uL (ref 4.0–10.5)
nRBC: 0 % (ref 0.0–0.2)

## 2018-06-03 LAB — POCT INR: INR: 3.1 — AB (ref 2.0–3.0)

## 2018-06-03 LAB — COMPREHENSIVE METABOLIC PANEL
ALT: 23 U/L (ref 0–44)
AST: 24 U/L (ref 15–41)
Albumin: 3.4 g/dL — ABNORMAL LOW (ref 3.5–5.0)
Alkaline Phosphatase: 78 U/L (ref 38–126)
Anion gap: 12 (ref 5–15)
BUN: 18 mg/dL (ref 8–23)
CHLORIDE: 103 mmol/L (ref 98–111)
CO2: 20 mmol/L — ABNORMAL LOW (ref 22–32)
CREATININE: 1.19 mg/dL (ref 0.61–1.24)
Calcium: 8.9 mg/dL (ref 8.9–10.3)
GFR calc Af Amer: >60 mL/min (ref >60)
GLUCOSE: 131 mg/dL — AB (ref 70–99)
Potassium: 3.8 mmol/L (ref 3.5–5.1)
Sodium: 135 mmol/L (ref 135–145)
Total Bilirubin: 0.6 mg/dL (ref 0.3–1.2)
Total Protein: 6.8 g/dL (ref 6.5–8.1)

## 2018-06-03 LAB — T4, FREE: Free T4: 0.74 ng/dL — ABNORMAL LOW (ref 0.82–1.77)

## 2018-06-03 LAB — TSH: TSH: 21.455 u[IU]/mL — AB (ref 0.350–4.500)

## 2018-06-03 MED ORDER — SACUBITRIL-VALSARTAN 24-26 MG PO TABS: 1.0000 | ORAL_TABLET | Freq: Two times a day (BID) | ORAL | 11 refills | Status: DC

## 2018-06-03 NOTE — Patient Instructions (Addendum)
Description   Start taking 1/2 tablet daily except 1 tablet Fridays.  Increase your green leafy veggies to three a week.  Recheck INR in 2.5 weeks per pt request.  Coumadin Clinic [phone] Main [phone]

## 2018-06-03 NOTE — Telephone Encounter (Signed)
-----   Message from Jolaine Artist, MD sent at 06/03/2018 12:12 PM EST ----- Looks good. Platelets ok. Please let him know.

## 2018-06-03 NOTE — Progress Notes (Signed)
CSW met with patient today as he reports no prescription drug coverage. Patient states he previously had BC/BS as he was working but retired and no longer has the [employer]. Patient reports he has medicare but was not aware to sign up for Medicare D. CSW provided information on [facility] for further information/advice on Medicare D plans. CSW also followed up with [facility] patient Assistance fund and application was faxed but not yet reviewed by [employer]. Patient informed of follow up with [employer] end of the week and to contact [facility] for Medicare D plan. Patient verbalizes understanding and will return call if further assistance needed. Raquel Sarna, [place], [place]

## 2018-06-03 NOTE — Telephone Encounter (Signed)
Bensimhon, Shaune Pascal, MD  Marlise Eves, RN        Looks good. Platelets ok. Please let him know.    Spoke with pt. Pt aware, agreeable and verbalized understanding.

## 2018-06-03 NOTE — Patient Instructions (Signed)
EKG was done today.  Labs were done today.  Your physician recommends you schedule an Ultrasound to reassess your ascending aortic aneurism.  Take ENTRESTO 24/26mg  (1tab) twice a day.  Your physician has requested that you have an echocardiogram. Echocardiography is a painless test that uses sound waves to create images of your heart. It provides your doctor with information about the size and shape of your heart and how well your heart's chambers and valves are working. This procedure takes approximately one hour. There are no restrictions for this procedure.  Your physician recommends that you schedule a follow-up appointment in 6 months.

## 2018-06-03 NOTE — Progress Notes (Signed)
2 bottles of ENTRESO 24/26mg  samples given. LOT [id number] EX MAR 2020

## 2018-06-03 NOTE — Progress Notes (Addendum)
Advanced Heart Failure Clinic Note   Primary Cardiologist: Martin Majestic  PCP: Dr. Marveen Reeks ([place], [place]) HF MD: Dr Vaughan Browner  EP: Dr Caryl Comes  HPI: Taylor [name] is a 68 y.o. male with mechnical AVR, CAD s/p CABG, systolic HF and essential thrombosis.   Has h/o congenital AoV abnormality with AI. In 1994 had MI and underwent PCI (unclear artery) and mechanical AVR at [facility].   Has h/o polysubstance abuse with cocaine, ETOH and tobacco abuse. No longer doing cocaine or smoking. Now drinks about 2 beers per day.  Did well from heart perspective until 2017. When he had VF arrest on 05/18/16. Found to have 3v CAD and MV disease and underwent CABG x 3 with mechanical MVR. Post-operatively had AF (treated with amio) and CHB requiring implantation of a [employer] CRT-D device.   He presents today for regular follow up. Previously Entresto cut back down to 24/26 mg BID due to dizziness. Then had to stop completely due to cost. Has been off about for about 2 weeks. Now applying for company support. Denies CP, SOB, orthopnea, PND. Does all ADLs without problem. No bleeding with coumadin. INR today was 3.1.   Echo on 12/18 showed LVEF of 25-30% stable AVR  Had CT on 12/18 for his back. Showed 3.7cm AAA.   [employer] CRT-D: No VT/VF. PVC 0.4%/day  Review of systems complete and found to be negative unless listed in HPI.    PMHx: As per HPI.   Current Outpatient Medications  Medication Sig Dispense Refill  . amiodarone (PACERONE) 200 MG tablet Take 1 tablet (200 mg total) by mouth daily. 30 tablet 11  . aspirin 81 MG tablet Take 1 tablet by mouth.    . carvedilol (COREG) 6.25 MG tablet Take 1 tablet (6.25 mg total) by mouth 2 (two) times daily with a meal. 180 tablet 3  . hydroxyurea (HYDREA) 500 MG capsule TAKE 1 CAPSULE BY MOUTH ALTERNATING WITH 2 CAPSULES DAILY(500 MG ONE DAY AND 1000 MG NEXT DAY). 135 capsule 3  . levothyroxine (SYNTHROID, LEVOTHROID) 25 MCG tablet Take 2 tablets (50 mcg total)  by mouth daily. Additional refills should come from PCP 60 tablet 1  . spironolactone (ALDACTONE) 25 MG tablet TAKE 1/2 TABLET(12.5 MG) BY MOUTH DAILY 15 tablet 0  . warfarin (COUMADIN) 5 MG tablet Take as directed by coumadin clinic 30 tablet 3   No current facility-administered medications for this encounter.     Allergies  Allergen Reactions  . Erythromycin Rash  . Atorvastatin Calcium Other (See Comments)  . Rosuvastatin Calcium   . Simvastatin   . Zithromax [Azithromycin] Rash      Social History   Socioeconomic History  . Marital status: Married    Spouse name: Not on file  . Number of children: Not on file  . Years of education: Not on file  . Highest education level: Not on file  Occupational History  . Not on file  Social Needs  . Financial resource strain: Not on file  . Food insecurity:    Worry: Not on file    Inability: Not on file  . Transportation needs:    Medical: Not on file    Non-medical: Not on file  Tobacco Use  . Smoking status: Former Smoker    Packs/day: 1.00    Years: 30.00    Pack years: 30.00    Types: Cigarettes    Start date: 05/26/1979    Last attempt to quit: 05/18/2016    Years since  quitting: 2.0  . Smokeless tobacco: Never Used  Substance and Sexual Activity  . Alcohol use: Yes    Alcohol/week: 14.0 standard drinks    Types: 14 Cans of beer per week    Comment: 2 cans a day..  . Drug use: No  . Sexual activity: Not on file  Lifestyle  . Physical activity:    Days per week: Not on file    Minutes per session: Not on file  . Stress: Not on file  Relationships  . Social connections:    Talks on phone: Not on file    Gets together: Not on file    Attends religious service: Not on file    Active member of club or organization: Not on file    Attends meetings of clubs or organizations: Not on file    Relationship status: Not on file  . Intimate partner violence:    Fear of current or ex partner: Not on file    Emotionally  abused: Not on file    Physically abused: Not on file    Forced sexual activity: Not on file  Other Topics Concern  . Not on file  Social History Narrative  . Not on file   SocHX: . Smoking status: Former Smoker  Packs/day: 1.00  Years: 33.00  Types: Cigarettes  Quit date: 05/17/2016  . Smokeless tobacco: Never Used  . Alcohol use 8.4 oz/week  14 Cans of beer per week  Comment: 2 cans beer per day  . Drug use: No   FamHX:  Father died from CHF at 43 Mother alive no heart problems H/o colon CA on Moms side 1 sister who is fine   Vitals:   06/03/18 1024  BP: 130/70  Pulse: 69  SpO2: 98%  Weight: 77.7 kg (171 lb 3.2 oz)   Wt Readings from Last 3 Encounters:  06/03/18 77.7 kg (171 lb 3.2 oz)  02/18/18 74.7 kg (164 lb 9.6 oz)  11/30/17 75.7 kg (166 lb 12.8 oz)    PHYSICAL EXAM: General:  Well appearing. No resp difficulty HEENT: normal Neck: supple. no JVD. Carotids 2+ bilat; no bruits. No lymphadenopathy or thryomegaly appreciated. Cor: PMI nondisplaced. Regular rate & rhythm. Occasional PVC No rubs, gallops or murmurs. Mechanical S2 Lungs: clear Abdomen: soft, nontender, nondistended. No hepatosplenomegaly. No bruits or masses. Good bowel sounds. Extremities: no cyanosis, clubbing, rash, edema Neuro: alert & orientedx3, cranial nerves grossly intact. moves all 4 extremities w/o difficulty. Affect pleasant  ECG: AV paced 61 bpm Personally reviewed  ASSESSMENT & PLAN:  1. Chronic systolic HF due to iCM - Echo 12/18: EF 25-30%  - Stable NYHA II symptoms - Volume status stable on exam. Not on lasix. - Continue spiro 12.5mg  daily - Off entresto 24-26 mg BID - now applying for company assistance. Will give samples and f/u on insurance.   - Continue carvedilol 6.25 BID.  - Reinforced fluid restriction to < 2 L daily, sodium restriction to less than 2000 mg daily, and the importance of daily weights.    2. CAD s/p CABG - No s/s of ischemia.    - Continue statin,  b-blocker and ASA (with mechanical valves will not stop ASA in presence of warfarin)  3. S/p mechanical AVR - Continue ASA and coumadin. Followed by PCP - No bleeding. INR 3.1 today - Reminded him of need for SBE prophylaxis   4. Post-op AF - has not recurred. No A Fib on ICD interrogations.   5. Frequent PVCs -  Has [employer] CRT-D. Interrogated personally in clinic. No VT/ AF - Taking amiodarone 200 mg daily  - TSH/LFTs per PCP.  - PVC burden on device  = 0.4%  6. Essential thrombocytosis - Continue hydroxyurea - Follow up with Hematology.  - No change to current plan.    7. AAA - CT scan 12/18 3.7cm - Due for ab u/s to reassess  8. ETOH abuse - Has cut back. Drinking 2 beers daily.   Glori Bickers, MD  10:38 AM

## 2018-06-03 NOTE — Addendum Note (Signed)
Encounter addended by: Louann Liv, LCSW on: 06/03/2018 4:33 PM  Actions taken: Clinical Note Signed

## 2018-06-04 LAB — T3, FREE: T3, Free: 2.3 pg/mL (ref 2.0–4.4)

## 2018-06-05 ENCOUNTER — Telehealth (HOSPITAL_COMMUNITY): Payer: Self-pay | Admitting: Cardiology

## 2018-06-05 ENCOUNTER — Ambulatory Visit (HOSPITAL_COMMUNITY): Payer: Medicare Other

## 2018-06-05 ENCOUNTER — Telehealth (HOSPITAL_COMMUNITY): Payer: Self-pay | Admitting: Licensed Clinical Social Worker

## 2018-06-05 MED ORDER — LEVOTHYROXINE SODIUM 25 MCG PO TABS: 50.0000 ug | ORAL_TABLET | Freq: Every day | ORAL | 1 refills | Status: DC

## 2018-06-05 NOTE — Telephone Encounter (Signed)
-----   Message from Jolaine Artist, MD sent at 06/04/2018  7:42 PM EST ----- Please increase synthroid to 75 mcg daily and have PCP recheck in 4 weeks

## 2018-06-05 NOTE — Telephone Encounter (Signed)
Notes recorded by Kerry Dory, CMA on 06/05/2018 at 10:06 AM EST Patient aware of results, reports he is only taking ONE tab daily of synthroid, advised to increase to TWO tabs daily, and still have PCP recheck in 4 weeks. ------  Notes recorded by Jolaine Artist, MD on 06/04/2018 at 7:42 PM EST Please increase synthroid to 75 mcg daily and have PCP recheck in 4 weeks

## 2018-06-05 NOTE — Telephone Encounter (Signed)
Fax received stating that pt has been approved through [employer] for his Entresto until 06/06/2019.  Pt informed and encouraged to call [employer] to initiate shipments.  CSW will continue to follow and assist as needed  Jorge Ny, LCSW Clinical Social Worker [facility] [phone]

## 2018-06-06 ENCOUNTER — Encounter (HOSPITAL_COMMUNITY): Payer: Self-pay

## 2018-06-06 ENCOUNTER — Other Ambulatory Visit (HOSPITAL_COMMUNITY): Payer: Self-pay | Admitting: *Deleted

## 2018-06-06 ENCOUNTER — Ambulatory Visit (HOSPITAL_COMMUNITY)
Admission: RE | Admit: 2018-06-06 | Discharge: 2018-06-06 | Disposition: A | Discharge status: Home / Self Care | Payer: Medicare Other | Source: Ambulatory Visit | Attending: Internal Medicine | Admitting: Internal Medicine

## 2018-06-06 DIAGNOSIS — Z952 Presence of prosthetic heart valve: Secondary | ICD-10-CM

## 2018-06-06 DIAGNOSIS — I714 Abdominal aortic aneurysm, without rupture, unspecified: Secondary | ICD-10-CM

## 2018-06-10 ENCOUNTER — Telehealth (HOSPITAL_COMMUNITY): Payer: Self-pay

## 2018-06-10 ENCOUNTER — Other Ambulatory Visit: Payer: Self-pay

## 2018-06-10 DIAGNOSIS — I714 Abdominal aortic aneurysm, without rupture, unspecified: Secondary | ICD-10-CM

## 2018-06-10 NOTE — Telephone Encounter (Signed)
Spoke with pt about referral to [facility] and abdominal ultrasound results. Pt aware, agreeable, and verbalized understanding.

## 2018-06-10 NOTE — Telephone Encounter (Signed)
-----   Message from Jolaine Artist, MD sent at 06/06/2018  3:42 PM EST ----- Please refer to [facility]. Not big enough to need surgery yet but will need to follow

## 2018-06-11 ENCOUNTER — Other Ambulatory Visit (HOSPITAL_COMMUNITY): Payer: Self-pay

## 2018-06-11 DIAGNOSIS — I5022 Chronic systolic (congestive) heart failure: Secondary | ICD-10-CM

## 2018-06-22 LAB — CUP PACEART REMOTE DEVICE CHECK
Battery Remaining Longevity: 96 mo
Battery Remaining Percentage: 100 %
Date Time Interrogation Session: 20191212054200
HIGH POWER IMPEDANCE MEASURED VALUE: 69 Ohm
Implantable Lead Implant Date: 20180105
Implantable Lead Implant Date: 20180105
Implantable Lead Implant Date: 20180105
Implantable Lead Location: 753858
Implantable Lead Location: 753860
Implantable Lead Model: 292
Implantable Lead Model: 7741
Implantable Lead Serial Number: 428973
Implantable Lead Serial Number: 801088
Implantable Lead Serial Number: 849519
Implantable Pulse Generator Implant Date: 20180105
Lead Channel Impedance Value: 583 Ohm
Lead Channel Impedance Value: 760 Ohm
Lead Channel Pacing Threshold Amplitude: 0.6 V
Lead Channel Pacing Threshold Amplitude: 0.7 V
Lead Channel Pacing Threshold Amplitude: 1 V
Lead Channel Pacing Threshold Pulse Width: 0.4 ms
Lead Channel Pacing Threshold Pulse Width: 0.4 ms
Lead Channel Pacing Threshold Pulse Width: 1 ms
Lead Channel Setting Pacing Amplitude: 2 V
Lead Channel Setting Pacing Amplitude: 2 V
Lead Channel Setting Pacing Pulse Width: 1 ms
Lead Channel Setting Sensing Sensitivity: 0.6 mV
Lead Channel Setting Sensing Sensitivity: 1 mV
MDC IDC LEAD LOCATION: 753859
MDC IDC MSMT LEADCHNL LV IMPEDANCE VALUE: 571 Ohm
MDC IDC SET LEADCHNL LV PACING AMPLITUDE: 2.2 V
MDC IDC SET LEADCHNL RV PACING PULSEWIDTH: 0.4 ms
MDC IDC STAT BRADY RA PERCENT PACED: 43 %
MDC IDC STAT BRADY RV PERCENT PACED: 86 %
Pulse Gen Serial Number: 180139

## 2018-06-24 ENCOUNTER — Other Ambulatory Visit (HOSPITAL_COMMUNITY): Payer: Self-pay | Admitting: Internal Medicine

## 2018-06-25 ENCOUNTER — Ambulatory Visit (INDEPENDENT_AMBULATORY_CARE_PROVIDER_SITE_OTHER): Payer: Medicare Other | Admitting: Pharmacist

## 2018-06-25 DIAGNOSIS — I5022 Chronic systolic (congestive) heart failure: Secondary | ICD-10-CM

## 2018-06-25 DIAGNOSIS — Z5181 Encounter for therapeutic drug level monitoring: Secondary | ICD-10-CM

## 2018-06-25 LAB — POCT INR: INR: 2.1 (ref 2.0–3.0)

## 2018-06-25 NOTE — Patient Instructions (Signed)
Description   Continue taking 1/2 tablet daily except 1 tablet on Fridays. Recheck INR in 3 weeks.  Coumadin Clinic [phone] Main [phone]

## 2018-07-01 ENCOUNTER — Other Ambulatory Visit: Payer: Self-pay | Admitting: Oncology

## 2018-07-04 ENCOUNTER — Encounter: Payer: Self-pay | Admitting: Vascular Surgery

## 2018-07-04 ENCOUNTER — Other Ambulatory Visit: Payer: Self-pay

## 2018-07-04 ENCOUNTER — Ambulatory Visit (INDEPENDENT_AMBULATORY_CARE_PROVIDER_SITE_OTHER): Payer: Medicare Other | Admitting: Vascular Surgery

## 2018-07-04 VITALS — BP 93/52 | HR 62 | Temp 98.0°F | Resp 20 | Ht 69.0 in | Wt 171.0 lb

## 2018-07-04 DIAGNOSIS — I714 Abdominal aortic aneurysm, without rupture, unspecified: Secondary | ICD-10-CM

## 2018-07-04 NOTE — Progress Notes (Signed)
Referring Physician: Dr Haroldine Laws  Patient name: Taylor [name] MRN: [id number] DOB: [birth year] Sex: male  REASON FOR CONSULT: Asymptomatic 4.2 cm abdominal aortic aneurysm  HPI: Taylor [name] is a 68 y.o. male, with a known abdominal aortic aneurysm that was discovered on a lumbar spine CT scan approximately 1 year ago.  At that time the CT scan showed the aneurysm was 3.7 cm in diameter.  This was not a dedicated CT Angio of the abdomen and pelvis.  He was recently seen by his cardiologist and had an ultrasound noted that the aneurysm was now 4.2 cm in diameter.  He has chronic back pain.  He has not noted any new abdominal pain.  He has no family history of abdominal aortic aneurysm.  Other medical problems include significant cardiac dysfunction with an ejection fraction of 25 to 30% on his last echocardiogram in 2018.  He also has a left-sided AICD.  Other chronic medical problems include hyperlipidemia and hypertension both of which have been stable.  He does have a history of aortic valve replacement and is on warfarin.  Past Medical History:  Diagnosis Date  . Atherosclerotic heart disease of native coronary artery without angina pectoris   . Chronic anticoagulation 02/18/2018  . Chronic systolic heart failure ([place]) 02/02/2017  . Essential thrombocythemia ([place]) 02/15/2017  . Hyperlipidemia   . Hypertension   . Ischemic cardiomyopathy   . Leg pain, central, left 04/17/2017   Hx sciatica - pain similar; no red flag signs on hx or exam   . Presence of other heart-valve replacement   . Ventricular tachycardia ([place])    History reviewed. No pertinent surgical history.  History reviewed. No pertinent family history.  SOCIAL HISTORY: Social History   Socioeconomic History  . Marital status: Married    Spouse name: Not on file  . Number of children: Not on file  . Years of education: Not on file  . Highest education level: Not on file  Occupational History  . Not on file  Social  Needs  . Financial resource strain: Not on file  . Food insecurity:    Worry: Not on file    Inability: Not on file  . Transportation needs:    Medical: Not on file    Non-medical: Not on file  Tobacco Use  . Smoking status: Former Smoker    Packs/day: 1.00    Years: 30.00    Pack years: 30.00    Types: Cigarettes    Start date: 05/26/1979    Last attempt to quit: 05/18/2016    Years since quitting: 2.1  . Smokeless tobacco: Never Used  Substance and Sexual Activity  . Alcohol use: Yes    Alcohol/week: 14.0 standard drinks    Types: 14 Cans of beer per week    Comment: 2 cans a day..  . Drug use: No  . Sexual activity: Not on file  Lifestyle  . Physical activity:    Days per week: Not on file    Minutes per session: Not on file  . Stress: Not on file  Relationships  . Social connections:    Talks on phone: Not on file    Gets together: Not on file    Attends religious service: Not on file    Active member of club or organization: Not on file    Attends meetings of clubs or organizations: Not on file    Relationship status: Not on file  . Intimate partner violence:  Fear of current or ex partner: Not on file    Emotionally abused: Not on file    Physically abused: Not on file    Forced sexual activity: Not on file  Other Topics Concern  . Not on file  Social History Narrative  . Not on file    Allergies  Allergen Reactions  . Erythromycin Rash  . Atorvastatin Calcium Other (See Comments)  . Rosuvastatin Calcium   . Simvastatin   . Zithromax [Azithromycin] Rash    Current Outpatient Medications  Medication Sig Dispense Refill  . amiodarone (PACERONE) 200 MG tablet Take 1 tablet (200 mg total) by mouth daily. 30 tablet 11  . aspirin 81 MG tablet Take 1 tablet by mouth.    . carvedilol (COREG) 6.25 MG tablet Take 1 tablet (6.25 mg total) by mouth 2 (two) times daily with a meal. 180 tablet 3  . hydroxyurea (HYDREA) 500 MG capsule TAKE 1 CAPSULE BY MOUTH  ALTERNATING WITH 2 CAPSULES DAILY(500 MG ONE DAY AND 1000 MG NEXT DAY). 135 capsule 3  . levothyroxine (SYNTHROID, LEVOTHROID) 25 MCG tablet Take 2 tablets (50 mcg total) by mouth daily. Additional refills should come from PCP 60 tablet 1  . sacubitril-valsartan (ENTRESTO) 24-26 MG Take 1 tablet by mouth 2 (two) times daily. 60 tablet 11  . spironolactone (ALDACTONE) 25 MG tablet TAKE 1/2 TABLET(12.5 MG) BY MOUTH DAILY 15 tablet 6  . warfarin (COUMADIN) 5 MG tablet Take as directed by coumadin clinic 30 tablet 3   No current facility-administered medications for this visit.     ROS:   General:  No weight loss, Fever, chills  HEENT: No recent headaches, no nasal bleeding, no visual changes, no sore throat  Neurologic: No dizziness, blackouts, seizures. No recent symptoms of stroke or mini- stroke. No recent episodes of slurred speech, or temporary blindness.  Cardiac: No recent episodes of chest pain/pressure, no shortness of breath at rest.  No shortness of breath with exertion.  Denies history of atrial fibrillation or irregular heartbeat  Vascular: No history of rest pain in feet.  No history of claudication.  No history of non-healing ulcer, No history of DVT   Pulmonary: No home oxygen, no productive cough, no hemoptysis,  No asthma or wheezing  Musculoskeletal:  [X]  Arthritis, [X]  Low back pain,  [ ]  Joint pain  Hematologic:No history of hypercoagulable state.  No history of easy bleeding.  No history of anemia  Gastrointestinal: No hematochezia or melena,  No gastroesophageal reflux, no trouble swallowing  Urinary: [ ]  chronic Kidney disease, [ ]  on HD - [ ]  MWF or [ ]  TTHS, [ ]  Burning with urination, [ ]  Frequent urination, [ ]  Difficulty urinating;   Skin: No rashes  Psychological: No history of anxiety,  No history of depression   Physical Examination  Vitals:   07/04/18 1432  BP: (!) 93/52  Pulse: 62  Resp: 20  Temp: 98 F (36.7 C)  SpO2: 97%  Weight: 171 lb  (77.6 kg)  Height: 5\' 9"  (1.753 m)    Body mass index is 25.25 kg/m.  General:  Alert and oriented, no acute distress HEENT: Normal Neck: No bruit or JVD Pulmonary: Clear to auscultation bilaterally Cardiac: Regular Rate and Rhythm without murmur Abdomen: Soft, non-tender, non-distended, no mass, no scars Skin: No rash Extremity Pulses:  2+ radial, brachial, femoral, dorsalis pedis, posterior tibial pulses bilaterally Musculoskeletal: No deformity or edema  Neurologic: Upper and lower extremity motor 5/5 and symmetric  DATA:  Ultrasound of the aorta 4.2 cm diameter abdominal aortic aneurysm  ASSESSMENT: Asymptomatic 4.2 cm abdominal aortic aneurysm.  Discrepancy in size from CT scan 1 year ago compared to recent ultrasound.  Size is still considerably less than threshold of 5 to 5-1/2 cm diameter that we would consider repair in the absence of symptoms.   PLAN: Repeat abdominal aortic ultrasound in 6 months time.  If continued increase in size we consider repeat CT scan.  Patient was advised to let any emergency room know if he has severe abdominal or back pain that is different from his usual chronic back pain.  Ruta Hinds, MD [facility] Office: [phone] Pager: [phone]

## 2018-07-09 ENCOUNTER — Encounter: Payer: Self-pay | Admitting: Internal Medicine

## 2018-07-09 ENCOUNTER — Ambulatory Visit (INDEPENDENT_AMBULATORY_CARE_PROVIDER_SITE_OTHER): Payer: Medicare Other | Admitting: Internal Medicine

## 2018-07-09 ENCOUNTER — Other Ambulatory Visit: Payer: Self-pay

## 2018-07-09 VITALS — BP 116/62 | HR 62 | Ht 69.0 in | Wt 174.0 lb

## 2018-07-09 DIAGNOSIS — Z95810 Presence of automatic (implantable) cardiac defibrillator: Secondary | ICD-10-CM

## 2018-07-09 DIAGNOSIS — I493 Ventricular premature depolarization: Secondary | ICD-10-CM

## 2018-07-09 DIAGNOSIS — I5022 Chronic systolic (congestive) heart failure: Secondary | ICD-10-CM

## 2018-07-09 DIAGNOSIS — I4901 Ventricular fibrillation: Secondary | ICD-10-CM

## 2018-07-09 MED ORDER — WARFARIN SODIUM 5 MG PO TABS: ORAL_TABLET | ORAL | 3 refills | Status: DC

## 2018-07-09 NOTE — Patient Instructions (Signed)
Medication Instructions:  Your physician recommends that you continue on your current medications as directed. Please refer to the Current Medication list given to you today.  Labwork: None ordered.  Testing/Procedures: None ordered.  Follow-Up: Your physician recommends that you schedule a follow-up appointment in:   Please reschedule you coumadin check for our [place] or [place] location next week.  One Year with Dr Caryl Comes  Any Other Special Instructions Will Be Listed Below (If Applicable).     If you need a refill on your cardiac medications before your next appointment, please call your pharmacy.

## 2018-07-09 NOTE — Progress Notes (Signed)
Patient Care Team: Michell Heinrich, DO as PCP - General (Family Medicine) Annia Belt, MD as Consulting Physician (Oncology) Bensimhon, Shaune Pascal, MD as Consulting Physician (Cardiology)   HPI  Taylor [name] is a 68 y.o. male seen in follow-up for VF arrest 12/17.  He has a history of a prior mechanical aortic valve replacement.  Following his arrest he underwent CABG and mechanical mitral valve replacement. Not unsurprisingly he ended up with postoperative CHB and underwent CRT-D implantation.  He has a history of congenital aortic malformation with aortic insufficiency.  History of ischemic cardiomyopathy with prior MI and prior PCI.  Prior history of polysubstance abuse.  Drinks 2 beers a day.  On amiodarone for freq PVCs started at [facility].  DATE TEST EF   1/18    Echo  40 %    12/18 Echo  25-30%          Date Cr K Hgb TSH LFTs  1/20 1.19 3.8 13.1 21.5 23           Some orthopnea.  Dyspnea on exertion.  Trace edema.  No chest pain.     Records and Results Reviewed   Past Medical History:  Diagnosis Date  . Atherosclerotic heart disease of native coronary artery without angina pectoris   . Chronic anticoagulation 02/18/2018  . Chronic systolic heart failure ([place]) 02/02/2017  . Essential thrombocythemia ([place]) 02/15/2017  . Hyperlipidemia   . Hypertension   . Ischemic cardiomyopathy   . Leg pain, central, left 04/17/2017   Hx sciatica - pain similar; no red flag signs on hx or exam   . Presence of other heart-valve replacement   . Ventricular tachycardia ([place])     History reviewed. No pertinent surgical history.  Current Meds  Medication Sig  . amiodarone (PACERONE) 200 MG tablet Take 1 tablet (200 mg total) by mouth daily.  Marland Kitchen aspirin 81 MG tablet Take 1 tablet by mouth.  . carvedilol (COREG) 6.25 MG tablet Take 1 tablet (6.25 mg total) by mouth 2 (two) times daily with a meal.  . hydroxyurea (HYDREA) 500 MG capsule TAKE 1 CAPSULE BY MOUTH  ALTERNATING WITH 2 CAPSULES DAILY(500 MG ONE DAY AND 1000 MG NEXT DAY).  Marland Kitchen levothyroxine (SYNTHROID, LEVOTHROID) 25 MCG tablet Take 2 tablets (50 mcg total) by mouth daily. Additional refills should come from PCP  . sacubitril-valsartan (ENTRESTO) 24-26 MG Take 1 tablet by mouth 2 (two) times daily.  Marland Kitchen spironolactone (ALDACTONE) 25 MG tablet TAKE 1/2 TABLET(12.5 MG) BY MOUTH DAILY  . [DISCONTINUED] warfarin (COUMADIN) 5 MG tablet Take as directed by coumadin clinic    Allergies  Allergen Reactions  . Erythromycin Rash  . Atorvastatin Calcium Other (See Comments)  . Rosuvastatin Calcium   . Simvastatin   . Zithromax [Azithromycin] Rash      Review of Systems negative except from HPI and PMH  Physical Exam BP 116/62   Pulse 62   Ht 5\' 9"  (1.753 m)   Wt 174 lb (78.9 kg)   SpO2 93%   BMI 25.70 kg/m  Well developed and well nourished in no acute distress HENT normal E scleral and icterus clear Neck Supple JVP flat; carotids brisk and full Clear to ausculation Device pocket well healed; without hematoma or erythema.  There is no tethering Regular rate and rhythm, no murmurs gallops or rub Soft with active bowel sounds No clubbing cyanosis  Edema Alert and oriented, grossly normal motor and sensory function Skin Warm and  Dry  Sinus rhythm with P synchronous pacing at 62 Intervals 18/16/53 QRS upright lead V1 and QR lead I PVC  Assessment and  Plan Ischemic cardiomyopathy status post CABG  Aborted cardiac arrest  Valvular heart disease status post mechanical aortic/mitral valve replacement  ICD-[employer]-CRT-D The patient's device was interrogated and the information was fully reviewed.  The device was reprogrammed to inactivate rate response and decrease resting rate to 50  PVCs  Amiodarone therapy    The patient has persistent PVC burden about 15%.  I will review with Dr. Reine Just whether it is worth continuing the amiodarone.  I would be disinclined at this  point to continue it as is.  Potentially we could try adjunctive medication.  With bivalvular mechanical valves, would not be a candidate for catheter ablation.  Treated hypothyroidism  We spent more than 50% of our >25 min visit in face to face counseling regarding the above   Current medicines are reviewed at length with the patient today .  The patient does not  have concerns regarding medicines.

## 2018-07-16 ENCOUNTER — Ambulatory Visit (INDEPENDENT_AMBULATORY_CARE_PROVIDER_SITE_OTHER): Payer: Medicare Other | Admitting: *Deleted

## 2018-07-16 DIAGNOSIS — I5022 Chronic systolic (congestive) heart failure: Secondary | ICD-10-CM

## 2018-07-16 DIAGNOSIS — Z5181 Encounter for therapeutic drug level monitoring: Secondary | ICD-10-CM

## 2018-07-16 DIAGNOSIS — Z952 Presence of prosthetic heart valve: Secondary | ICD-10-CM | POA: Diagnosis not present

## 2018-07-16 LAB — CUP PACEART INCLINIC DEVICE CHECK
Date Time Interrogation Session: 20200211050000
HighPow Impedance: 68 Ohm
Implantable Lead Implant Date: 20180105
Implantable Lead Location: 753858
Implantable Lead Location: 753859
Implantable Lead Model: 292
Implantable Lead Model: 4672
Implantable Lead Model: 7741
Implantable Lead Serial Number: 428973
Implantable Lead Serial Number: 801088
Implantable Lead Serial Number: 849519
Implantable Pulse Generator Implant Date: 20180105
Lead Channel Impedance Value: 594 Ohm
Lead Channel Impedance Value: 671 Ohm
Lead Channel Impedance Value: 761 Ohm
Lead Channel Pacing Threshold Amplitude: 0.6 V
Lead Channel Pacing Threshold Amplitude: 0.7 V
Lead Channel Pacing Threshold Amplitude: 0.9 V
Lead Channel Pacing Threshold Pulse Width: 0.4 ms
Lead Channel Pacing Threshold Pulse Width: 0.4 ms
Lead Channel Pacing Threshold Pulse Width: 1 ms
Lead Channel Sensing Intrinsic Amplitude: 16 mV
Lead Channel Sensing Intrinsic Amplitude: 18.4 mV
Lead Channel Sensing Intrinsic Amplitude: 2.4 mV
Lead Channel Setting Pacing Amplitude: 2 V
Lead Channel Setting Pacing Amplitude: 2 V
Lead Channel Setting Pacing Amplitude: 2.2 V
Lead Channel Setting Pacing Pulse Width: 0.4 ms
Lead Channel Setting Pacing Pulse Width: 1 ms
Lead Channel Setting Sensing Sensitivity: 0.6 mV
Lead Channel Setting Sensing Sensitivity: 1 mV
MDC IDC LEAD IMPLANT DT: 20180105
MDC IDC LEAD IMPLANT DT: 20180105
MDC IDC LEAD LOCATION: 753860
Pulse Gen Serial Number: 180139

## 2018-07-16 LAB — POCT INR: INR: 1.5 — AB (ref 2.0–3.0)

## 2018-07-16 NOTE — Patient Instructions (Signed)
Description   Today and tomorrow take 1 tablet then continue taking 1/2 tablet daily except 1 tablet on Fridays. Recheck INR in 2 weeks.  Coumadin Clinic [phone] Main [phone]

## 2018-07-31 ENCOUNTER — Ambulatory Visit (INDEPENDENT_AMBULATORY_CARE_PROVIDER_SITE_OTHER): Payer: Medicare Other | Admitting: *Deleted

## 2018-07-31 DIAGNOSIS — Z5181 Encounter for therapeutic drug level monitoring: Secondary | ICD-10-CM | POA: Diagnosis not present

## 2018-07-31 DIAGNOSIS — Z952 Presence of prosthetic heart valve: Secondary | ICD-10-CM

## 2018-07-31 DIAGNOSIS — I5022 Chronic systolic (congestive) heart failure: Secondary | ICD-10-CM

## 2018-07-31 LAB — POCT INR: INR: 1.7 — AB (ref 2.0–3.0)

## 2018-07-31 NOTE — Patient Instructions (Signed)
Take coumadin 1 tablet tonight then increase dose to 1/2 tablet daily except 1 tablet on Tuesdays and Fridays. Recheck INR in 2 weeks.  Coumadin Clinic [phone]

## 2018-08-05 ENCOUNTER — Other Ambulatory Visit: Payer: Self-pay | Admitting: Internal Medicine

## 2018-08-08 ENCOUNTER — Ambulatory Visit (INDEPENDENT_AMBULATORY_CARE_PROVIDER_SITE_OTHER): Payer: Medicare Other | Admitting: *Deleted

## 2018-08-08 ENCOUNTER — Other Ambulatory Visit: Payer: Self-pay

## 2018-08-08 DIAGNOSIS — I4901 Ventricular fibrillation: Secondary | ICD-10-CM | POA: Diagnosis not present

## 2018-08-08 DIAGNOSIS — I5022 Chronic systolic (congestive) heart failure: Secondary | ICD-10-CM

## 2018-08-08 DIAGNOSIS — Z95810 Presence of automatic (implantable) cardiac defibrillator: Secondary | ICD-10-CM

## 2018-08-08 LAB — CUP PACEART REMOTE DEVICE CHECK
Battery Remaining Longevity: 96 mo
Battery Remaining Percentage: 100 %
Brady Statistic RA Percent Paced: 7 %
Brady Statistic RV Percent Paced: 89 %
Date Time Interrogation Session: 20200312044000
HighPow Impedance: 68 Ohm
Implantable Lead Implant Date: 20180105
Implantable Lead Implant Date: 20180105
Implantable Lead Location: 753858
Implantable Lead Location: 753859
Implantable Lead Location: 753860
Implantable Lead Model: 292
Implantable Lead Model: 4672
Implantable Lead Serial Number: 428973
Implantable Lead Serial Number: 801088
Implantable Lead Serial Number: 849519
Implantable Pulse Generator Implant Date: 20180105
Lead Channel Impedance Value: 575 Ohm
Lead Channel Impedance Value: 814 Ohm
Lead Channel Pacing Threshold Amplitude: 0.6 V
Lead Channel Pacing Threshold Amplitude: 0.7 V
Lead Channel Pacing Threshold Amplitude: 0.9 V
Lead Channel Pacing Threshold Pulse Width: 0.4 ms
Lead Channel Pacing Threshold Pulse Width: 0.4 ms
Lead Channel Pacing Threshold Pulse Width: 1 ms
Lead Channel Setting Pacing Amplitude: 2 V
Lead Channel Setting Pacing Amplitude: 2.2 V
Lead Channel Setting Pacing Pulse Width: 1 ms
Lead Channel Setting Sensing Sensitivity: 0.6 mV
Lead Channel Setting Sensing Sensitivity: 1 mV
MDC IDC LEAD IMPLANT DT: 20180105
MDC IDC MSMT LEADCHNL RV IMPEDANCE VALUE: 699 Ohm
MDC IDC SET LEADCHNL RA PACING AMPLITUDE: 2 V
MDC IDC SET LEADCHNL RV PACING PULSEWIDTH: 0.4 ms
Pulse Gen Serial Number: 180139

## 2018-08-09 ENCOUNTER — Other Ambulatory Visit (HOSPITAL_COMMUNITY): Payer: Self-pay | Admitting: Internal Medicine

## 2018-08-14 ENCOUNTER — Encounter: Payer: Self-pay | Admitting: *Deleted

## 2018-08-15 ENCOUNTER — Ambulatory Visit (INDEPENDENT_AMBULATORY_CARE_PROVIDER_SITE_OTHER): Payer: Medicare Other | Admitting: *Deleted

## 2018-08-15 ENCOUNTER — Other Ambulatory Visit: Payer: Self-pay

## 2018-08-15 ENCOUNTER — Encounter: Payer: Self-pay | Admitting: Cardiology

## 2018-08-15 DIAGNOSIS — I5022 Chronic systolic (congestive) heart failure: Secondary | ICD-10-CM

## 2018-08-15 DIAGNOSIS — Z952 Presence of prosthetic heart valve: Secondary | ICD-10-CM

## 2018-08-15 DIAGNOSIS — Z5181 Encounter for therapeutic drug level monitoring: Secondary | ICD-10-CM

## 2018-08-15 LAB — POCT INR: INR: 2.2 (ref 2.0–3.0)

## 2018-08-15 NOTE — Patient Instructions (Signed)
Continue coumadin 1/2 tablet daily except 1 tablet on Tuesdays and Fridays. Recheck INR in 3 weeks.  Coumadin Clinic [phone]

## 2018-08-15 NOTE — Progress Notes (Signed)
Remote ICD transmission.   

## 2018-09-11 ENCOUNTER — Telehealth: Payer: Self-pay | Admitting: *Deleted

## 2018-09-11 NOTE — Telephone Encounter (Signed)
° °  COVID-19 Pre-Screening Questions: ° °• Do you currently have a fever?NO ° ° °• Have you recently travelled on a cruise, internationally, or to [place], [place], [place], [place], [place], or [place], [place] ([employer]) ? NO °•  °• Have you been in contact with someone that is currently pending confirmation of Covid19 testing or has been confirmed to have the Covid19 virus?  NO °•  °Are you currently experiencing fatigue or cough? NO ° ° °   ° ° ° ° °

## 2018-09-12 ENCOUNTER — Ambulatory Visit (INDEPENDENT_AMBULATORY_CARE_PROVIDER_SITE_OTHER): Payer: Medicare Other | Admitting: *Deleted

## 2018-09-12 ENCOUNTER — Other Ambulatory Visit: Payer: Self-pay

## 2018-09-12 DIAGNOSIS — I5022 Chronic systolic (congestive) heart failure: Secondary | ICD-10-CM | POA: Diagnosis not present

## 2018-09-12 DIAGNOSIS — Z952 Presence of prosthetic heart valve: Secondary | ICD-10-CM | POA: Diagnosis not present

## 2018-09-12 DIAGNOSIS — Z5181 Encounter for therapeutic drug level monitoring: Secondary | ICD-10-CM | POA: Diagnosis not present

## 2018-09-12 LAB — POCT INR: INR: 2.7 (ref 2.0–3.0)

## 2018-09-12 NOTE — Patient Instructions (Signed)
Continue coumadin 1/2 tablet daily except 1 tablet on Tuesdays and Fridays. Recheck INR in 4 weeks.  Coumadin Clinic [phone]

## 2018-10-09 ENCOUNTER — Telehealth: Payer: Self-pay | Admitting: *Deleted

## 2018-10-09 NOTE — Telephone Encounter (Signed)

## 2018-10-10 ENCOUNTER — Ambulatory Visit (INDEPENDENT_AMBULATORY_CARE_PROVIDER_SITE_OTHER): Payer: Medicare Other | Admitting: *Deleted

## 2018-10-10 DIAGNOSIS — Z5181 Encounter for therapeutic drug level monitoring: Secondary | ICD-10-CM | POA: Diagnosis not present

## 2018-10-10 DIAGNOSIS — I5022 Chronic systolic (congestive) heart failure: Secondary | ICD-10-CM

## 2018-10-10 DIAGNOSIS — Z952 Presence of prosthetic heart valve: Secondary | ICD-10-CM | POA: Diagnosis not present

## 2018-10-10 LAB — POCT INR: INR: 3.5 — AB (ref 2.0–3.0)

## 2018-10-10 NOTE — Patient Instructions (Signed)
Hold coumadin tonight then decrease dose to 1/2 tablet daily except 1 tablet on Tuesdays  Recheck INR in 6 weeks.  Coumadin Clinic [phone]

## 2018-10-11 ENCOUNTER — Other Ambulatory Visit (HOSPITAL_COMMUNITY): Payer: Self-pay | Admitting: Internal Medicine

## 2018-11-07 ENCOUNTER — Ambulatory Visit (INDEPENDENT_AMBULATORY_CARE_PROVIDER_SITE_OTHER): Payer: Medicare Other | Admitting: *Deleted

## 2018-11-07 DIAGNOSIS — I4901 Ventricular fibrillation: Secondary | ICD-10-CM

## 2018-11-07 LAB — CUP PACEART REMOTE DEVICE CHECK
Battery Remaining Longevity: 96 mo
Battery Remaining Percentage: 100 %
Brady Statistic RA Percent Paced: 10 %
Brady Statistic RV Percent Paced: 88 %
Date Time Interrogation Session: 20200611045400
HighPow Impedance: 64 Ohm
Implantable Lead Implant Date: 20180105
Implantable Lead Implant Date: 20180105
Implantable Lead Implant Date: 20180105
Implantable Lead Location: 753858
Implantable Lead Location: 753859
Implantable Lead Location: 753860
Implantable Lead Model: 292
Implantable Lead Model: 4672
Implantable Lead Model: 7741
Implantable Lead Serial Number: 428973
Implantable Lead Serial Number: 801088
Implantable Lead Serial Number: 849519
Implantable Pulse Generator Implant Date: 20180105
Lead Channel Impedance Value: 569 Ohm
Lead Channel Impedance Value: 706 Ohm
Lead Channel Impedance Value: 777 Ohm
Lead Channel Setting Pacing Amplitude: 2 V
Lead Channel Setting Pacing Amplitude: 2 V
Lead Channel Setting Pacing Amplitude: 2.2 V
Lead Channel Setting Pacing Pulse Width: 0.4 ms
Lead Channel Setting Pacing Pulse Width: 1 ms
Lead Channel Setting Sensing Sensitivity: 0.6 mV
Lead Channel Setting Sensing Sensitivity: 1 mV
Pulse Gen Serial Number: 180139

## 2018-11-13 ENCOUNTER — Encounter: Payer: Self-pay | Admitting: Cardiology

## 2018-11-13 NOTE — Progress Notes (Signed)
Remote ICD transmission.   

## 2018-12-02 ENCOUNTER — Ambulatory Visit (INDEPENDENT_AMBULATORY_CARE_PROVIDER_SITE_OTHER): Payer: Medicare Other | Admitting: *Deleted

## 2018-12-02 ENCOUNTER — Other Ambulatory Visit: Payer: Self-pay

## 2018-12-02 DIAGNOSIS — I5022 Chronic systolic (congestive) heart failure: Secondary | ICD-10-CM

## 2018-12-02 DIAGNOSIS — Z952 Presence of prosthetic heart valve: Secondary | ICD-10-CM

## 2018-12-02 DIAGNOSIS — Z5181 Encounter for therapeutic drug level monitoring: Secondary | ICD-10-CM

## 2018-12-02 LAB — POCT INR: INR: 2.1 (ref 2.0–3.0)

## 2018-12-02 NOTE — Patient Instructions (Signed)
Continue coumadin 1/2 tablet daily except 1 tablet on Tuesdays  Recheck INR in 6 weeks.  [facility]in Clinic [phone]

## 2018-12-26 ENCOUNTER — Other Ambulatory Visit (HOSPITAL_COMMUNITY): Payer: Self-pay | Admitting: Internal Medicine

## 2019-01-06 ENCOUNTER — Ambulatory Visit (INDEPENDENT_AMBULATORY_CARE_PROVIDER_SITE_OTHER): Payer: Medicare Other | Admitting: *Deleted

## 2019-01-06 ENCOUNTER — Other Ambulatory Visit: Payer: Self-pay

## 2019-01-06 DIAGNOSIS — Z5181 Encounter for therapeutic drug level monitoring: Secondary | ICD-10-CM

## 2019-01-06 DIAGNOSIS — I5022 Chronic systolic (congestive) heart failure: Secondary | ICD-10-CM | POA: Diagnosis not present

## 2019-01-06 DIAGNOSIS — Z952 Presence of prosthetic heart valve: Secondary | ICD-10-CM

## 2019-01-06 LAB — POCT INR: INR: 2.6 (ref 2.0–3.0)

## 2019-01-06 NOTE — Patient Instructions (Signed)
Continue coumadin 1/2 tablet daily except 1 tablet on Tuesdays  Recheck INR in 6 weeks.  Coumadin Clinic [phone]

## 2019-02-06 ENCOUNTER — Ambulatory Visit (INDEPENDENT_AMBULATORY_CARE_PROVIDER_SITE_OTHER): Payer: Medicare Other | Admitting: *Deleted

## 2019-02-06 DIAGNOSIS — I5022 Chronic systolic (congestive) heart failure: Secondary | ICD-10-CM

## 2019-02-06 LAB — CUP PACEART REMOTE DEVICE CHECK
Battery Remaining Longevity: 96 mo
Battery Remaining Percentage: 100 %
Brady Statistic RA Percent Paced: 10 %
Brady Statistic RV Percent Paced: 90 %
Date Time Interrogation Session: 20200910054800
HighPow Impedance: 73 Ohm
Implantable Lead Implant Date: 20180105
Implantable Lead Implant Date: 20180105
Implantable Lead Implant Date: 20180105
Implantable Lead Location: 753858
Implantable Lead Location: 753859
Implantable Lead Location: 753860
Implantable Lead Model: 292
Implantable Lead Model: 4672
Implantable Lead Model: 7741
Implantable Lead Serial Number: 428973
Implantable Lead Serial Number: 801088
Implantable Lead Serial Number: 849519
Implantable Pulse Generator Implant Date: 20180105
Lead Channel Impedance Value: 609 Ohm
Lead Channel Impedance Value: 718 Ohm
Lead Channel Impedance Value: 774 Ohm
Lead Channel Setting Pacing Amplitude: 2 V
Lead Channel Setting Pacing Amplitude: 2 V
Lead Channel Setting Pacing Amplitude: 2.2 V
Lead Channel Setting Pacing Pulse Width: 0.4 ms
Lead Channel Setting Pacing Pulse Width: 1 ms
Lead Channel Setting Sensing Sensitivity: 0.6 mV
Lead Channel Setting Sensing Sensitivity: 1 mV
Pulse Gen Serial Number: 180139

## 2019-02-14 NOTE — Progress Notes (Signed)
Remote ICD transmission.   

## 2019-02-17 ENCOUNTER — Other Ambulatory Visit: Payer: Self-pay

## 2019-02-17 ENCOUNTER — Ambulatory Visit (INDEPENDENT_AMBULATORY_CARE_PROVIDER_SITE_OTHER): Payer: Medicare Other | Admitting: *Deleted

## 2019-02-17 DIAGNOSIS — I5022 Chronic systolic (congestive) heart failure: Secondary | ICD-10-CM | POA: Diagnosis not present

## 2019-02-17 DIAGNOSIS — Z952 Presence of prosthetic heart valve: Secondary | ICD-10-CM | POA: Diagnosis not present

## 2019-02-17 DIAGNOSIS — Z5181 Encounter for therapeutic drug level monitoring: Secondary | ICD-10-CM | POA: Diagnosis not present

## 2019-02-17 LAB — POCT INR: INR: 3.5 — AB (ref 2.0–3.0)

## 2019-02-17 MED ORDER — WARFARIN SODIUM 5 MG PO TABS: ORAL_TABLET | ORAL | 3 refills | Status: DC

## 2019-02-17 NOTE — Patient Instructions (Signed)
Hold coumadin tonight then resume 1/2 tablet daily except 1 tablet on Tuesdays  Recheck INR in 6 weeks.  Coumadin Clinic [phone]

## 2019-02-20 ENCOUNTER — Encounter (HOSPITAL_COMMUNITY): Payer: Medicare Other | Admitting: Internal Medicine

## 2019-02-20 ENCOUNTER — Other Ambulatory Visit (HOSPITAL_COMMUNITY): Payer: Medicare Other

## 2019-02-26 ENCOUNTER — Other Ambulatory Visit (HOSPITAL_COMMUNITY): Payer: Self-pay

## 2019-02-26 MED ORDER — CARVEDILOL 6.25 MG PO TABS: 6.2500 mg | ORAL_TABLET | Freq: Two times a day (BID) | ORAL | 3 refills | Status: DC

## 2019-02-27 ENCOUNTER — Other Ambulatory Visit (HOSPITAL_COMMUNITY): Payer: Self-pay

## 2019-02-27 MED ORDER — CARVEDILOL 6.25 MG PO TABS: 6.2500 mg | ORAL_TABLET | Freq: Two times a day (BID) | ORAL | 3 refills | Status: DC

## 2019-03-19 ENCOUNTER — Telehealth (HOSPITAL_COMMUNITY): Payer: Self-pay

## 2019-03-19 DIAGNOSIS — I5022 Chronic systolic (congestive) heart failure: Secondary | ICD-10-CM

## 2019-03-19 NOTE — Telephone Encounter (Signed)
Orders Placed This Encounter  Procedures  . ECHOCARDIOGRAM COMPLETE    Standing Status:   Future    Standing Expiration Date:   06/18/2020    Order Specific Question:   Where should this test be performed    Answer:   [place]    Order Specific Question:   Perflutren DEFINITY (image enhancing agent) should be administered unless hypersensitivity or allergy exist    Answer:   Administer Perflutren    Order Specific Question:   Reason for exam-Echo    Answer:   Congestive Heart Failure  428.0 / I50.9

## 2019-03-20 ENCOUNTER — Ambulatory Visit (HOSPITAL_BASED_OUTPATIENT_CLINIC_OR_DEPARTMENT_OTHER)
Admission: RE | Admit: 2019-03-20 | Discharge: 2019-03-20 | Disposition: A | Discharge status: Home / Self Care | Payer: Medicare Other | Source: Ambulatory Visit | Attending: Internal Medicine | Admitting: Internal Medicine

## 2019-03-20 ENCOUNTER — Ambulatory Visit (HOSPITAL_COMMUNITY)
Admission: RE | Admit: 2019-03-20 | Discharge: 2019-03-20 | Disposition: A | Discharge status: Home / Self Care | Payer: Medicare Other | Source: Ambulatory Visit | Attending: Family Medicine | Admitting: Family Medicine

## 2019-03-20 ENCOUNTER — Encounter (HOSPITAL_COMMUNITY): Payer: Self-pay | Admitting: Internal Medicine

## 2019-03-20 ENCOUNTER — Other Ambulatory Visit: Payer: Self-pay

## 2019-03-20 VITALS — BP 135/67 | HR 61 | Wt 174.8 lb

## 2019-03-20 DIAGNOSIS — I714 Abdominal aortic aneurysm, without rupture, unspecified: Secondary | ICD-10-CM

## 2019-03-20 DIAGNOSIS — I493 Ventricular premature depolarization: Secondary | ICD-10-CM | POA: Diagnosis not present

## 2019-03-20 DIAGNOSIS — I255 Ischemic cardiomyopathy: Secondary | ICD-10-CM | POA: Diagnosis not present

## 2019-03-20 DIAGNOSIS — Z8674 Personal history of sudden cardiac arrest: Secondary | ICD-10-CM | POA: Diagnosis not present

## 2019-03-20 DIAGNOSIS — I5022 Chronic systolic (congestive) heart failure: Secondary | ICD-10-CM

## 2019-03-20 DIAGNOSIS — Z951 Presence of aortocoronary bypass graft: Secondary | ICD-10-CM | POA: Diagnosis not present

## 2019-03-20 DIAGNOSIS — Z7982 Long term (current) use of aspirin: Secondary | ICD-10-CM | POA: Diagnosis not present

## 2019-03-20 DIAGNOSIS — Z79899 Other long term (current) drug therapy: Secondary | ICD-10-CM | POA: Insufficient documentation

## 2019-03-20 DIAGNOSIS — Z7901 Long term (current) use of anticoagulants: Secondary | ICD-10-CM | POA: Diagnosis not present

## 2019-03-20 DIAGNOSIS — I082 Rheumatic disorders of both aortic and tricuspid valves: Secondary | ICD-10-CM | POA: Diagnosis not present

## 2019-03-20 DIAGNOSIS — Z87891 Personal history of nicotine dependence: Secondary | ICD-10-CM | POA: Insufficient documentation

## 2019-03-20 DIAGNOSIS — I252 Old myocardial infarction: Secondary | ICD-10-CM | POA: Insufficient documentation

## 2019-03-20 DIAGNOSIS — I2510 Atherosclerotic heart disease of native coronary artery without angina pectoris: Secondary | ICD-10-CM | POA: Diagnosis not present

## 2019-03-20 DIAGNOSIS — Z952 Presence of prosthetic heart valve: Secondary | ICD-10-CM | POA: Insufficient documentation

## 2019-03-20 DIAGNOSIS — G4700 Insomnia, unspecified: Secondary | ICD-10-CM | POA: Diagnosis not present

## 2019-03-20 DIAGNOSIS — E785 Hyperlipidemia, unspecified: Secondary | ICD-10-CM | POA: Diagnosis not present

## 2019-03-20 DIAGNOSIS — D473 Essential (hemorrhagic) thrombocythemia: Secondary | ICD-10-CM | POA: Diagnosis not present

## 2019-03-20 DIAGNOSIS — I110 Hypertensive heart disease with heart failure: Secondary | ICD-10-CM | POA: Insufficient documentation

## 2019-03-20 DIAGNOSIS — Z8249 Family history of ischemic heart disease and other diseases of the circulatory system: Secondary | ICD-10-CM | POA: Diagnosis not present

## 2019-03-20 NOTE — Addendum Note (Signed)
Encounter addended by: Jolaine Artist, MD on: 03/20/2019 5:21 PM  Actions taken: Clinical Note Signed

## 2019-03-20 NOTE — Addendum Note (Signed)
Encounter addended by: Kerry Dory, CMA on: 03/20/2019 3:54 PM  Actions taken: Clinical Note Signed

## 2019-03-20 NOTE — Progress Notes (Addendum)
Advanced Heart Failure Clinic Note   Primary Cardiologist: Martin Majestic  PCP: Dr. Marveen Reeks ([place], [place]) HF MD: Dr Vaughan Browner  EP: Dr Caryl Comes  HPI: Taylor [name] is a 68 y.o. male with mechnical AVR, CAD s/p CABG, systolic HF and essential thrombosis.   Has h/o congenital AoV abnormality with AI. In 1994 had MI and underwent PCI (unclear artery) and mechanical AVR at [facility].   Has h/o polysubstance abuse with cocaine, ETOH and tobacco abuse. No longer doing cocaine or smoking. Now drinks about 2 beers per day.  Did well from heart perspective until 2017. When he had VF arrest on 05/18/16. Found to have 3v CAD and MV disease and underwent CABG x 3 with mechanical MVR. Post-operatively had AF (treated with amio) and CHB requiring implantation of a [employer] CRT-D device. This is followed by Dr. Caryl Comes. Coumadin Clinic at [facility] street follows INR. PCP in [place] follows lipids.   Echo on 12/18 showed LVEF of 25-30% stable AVR  Had CT on 12/18 for his back. Showed 3.7cm AAA. Most recent US 05/2018 showed mild increase in size of the known distal fusiform abdominal aortic aneurysm, measuring 4.2 cm in maximum diameter. He was referred to [facility] and he is now followed by Dr. Oneida Alar.   He presents today for regular follow up. Repeat echo done today. Preliminary review demonstrates persistently low EF ~25%. No cardiac complaints. Stable NYHA class II symptoms. Weight fairly stable, up only 3 lb since last OV in Jan (171>174 lb). No edema, dyspnea, orthopnea or PND. No anginal symptomatology. Reports compliance w/ meds except only taking Entresto once daily. He claims he cannot tolerate bid dosing due to dizziness. No longer on statin. Reports he was told by PCP that lipids were controlled and he stopped atorvastatin. No symptoms of breakthrough afib. Compliant w/ coumadin. No abnormal bleeding or falls. Continues to drink beer ~2/day. Main complaint is insomnia. Requesting sleep aid.   [employer] CRT-D:  No VT/VF. No afib.   Review of systems complete and found to be negative unless listed in HPI.    PMHx: As per HPI.   Current Outpatient Medications  Medication Sig Dispense Refill  . amiodarone (PACERONE) 200 MG tablet TAKE 1 TABLET(200 MG) BY MOUTH DAILY 30 tablet 11  . aspirin 81 MG tablet Take 1 tablet by mouth.    . carvedilol (COREG) 6.25 MG tablet Take 1 tablet (6.25 mg total) by mouth 2 (two) times daily with a meal. 180 tablet 3  . hydroxyurea (HYDREA) 500 MG capsule TAKE 1 CAPSULE BY MOUTH ALTERNATING WITH 2 CAPSULES DAILY(500 MG ONE DAY AND 1000 MG NEXT DAY). 135 capsule 3  . levothyroxine (SYNTHROID, LEVOTHROID) 25 MCG tablet Take 2 tablets (50 mcg total) by mouth daily. Additional refills should come from PCP 60 tablet 1  . sacubitril-valsartan (ENTRESTO) 24-26 MG Take 1 tablet by mouth 2 (two) times daily. 60 tablet 11  . spironolactone (ALDACTONE) 25 MG tablet TAKE 1/2 TABLET(12.5 MG) BY MOUTH DAILY 15 tablet 6  . warfarin (COUMADIN) 5 MG tablet Take as directed by coumadin clinic 30 tablet 3   No current facility-administered medications for this encounter.     Allergies  Allergen Reactions  . Erythromycin Rash  . Atorvastatin Calcium Other (See Comments)  . Rosuvastatin Calcium   . Simvastatin   . Zithromax [Azithromycin] Rash      Social History   Socioeconomic History  . Marital status: Married    Spouse name: Not on file  .  Number of children: Not on file  . Years of education: Not on file  . Highest education level: Not on file  Occupational History  . Not on file  Social Needs  . Financial resource strain: Not on file  . Food insecurity    Worry: Not on file    Inability: Not on file  . Transportation needs    Medical: Not on file    Non-medical: Not on file  Tobacco Use  . Smoking status: Former Smoker    Packs/day: 1.00    Years: 30.00    Pack years: 30.00    Types: Cigarettes    Start date: 05/26/1979    Quit date: 05/18/2016    Years  since quitting: 2.8  . Smokeless tobacco: Never Used  Substance and Sexual Activity  . Alcohol use: Yes    Alcohol/week: 14.0 standard drinks    Types: 14 Cans of beer per week    Comment: 2 cans a day..  . Drug use: No  . Sexual activity: Not on file  Lifestyle  . Physical activity    Days per week: Not on file    Minutes per session: Not on file  . Stress: Not on file  Relationships  . Social Herbalist on phone: Not on file    Gets together: Not on file    Attends religious service: Not on file    Active member of club or organization: Not on file    Attends meetings of clubs or organizations: Not on file    Relationship status: Not on file  . Intimate partner violence    Fear of current or ex partner: Not on file    Emotionally abused: Not on file    Physically abused: Not on file    Forced sexual activity: Not on file  Other Topics Concern  . Not on file  Social History Narrative  . Not on file   SocHX: . Smoking status: Former Smoker  Packs/day: 1.00  Years: 33.00  Types: Cigarettes  Quit date: 05/17/2016  . Smokeless tobacco: Never Used  . Alcohol use 8.4 oz/week  14 Cans of beer per week  Comment: 2 cans beer per day  . Drug use: No   FamHX:  Father died from CHF at 41 Mother alive no heart problems H/o colon CA on Moms side 1 sister who is fine   Vitals:   03/20/19 1401  BP: 135/67  Pulse: 61  SpO2: 98%  Weight: 79.3 kg (174 lb 12.8 oz)   Wt Readings from Last 3 Encounters:  07/09/18 78.9 kg (174 lb)  07/04/18 77.6 kg (171 lb)  06/03/18 77.7 kg (171 lb 3.2 oz)    PHYSICAL EXAM: PHYSICAL EXAM: General:  Well appearing. No respiratory difficulty HEENT: normal anicteric Neck: supple. no JVD. Carotids 2+ bilat; no bruits. No lymphadenopathy or thyromegaly appreciated. Cor: PMI nondisplaced. Regular rate & rhythm. Crisp mechanical valve sounds  Lungs: clear no wheeze Abdomen: soft, nontender, nondistended. No hepatosplenomegaly. No  bruits or masses. Good bowel sounds. Extremities: no cyanosis, clubbing, rash, edema Neuro: alert & oriented x 3, cranial nerves grossly intact. moves all 4 extremities w/o difficulty. Affect pleasant   ECG: not performed   ASSESSMENT & PLAN:  1. Chronic systolic HF due to Ischemic Cardiomyopathy  - Echo 12/18: EF 25-30%. Echo repeated today. EF appears unchanged ~25%.   - Stable NYHA II symptoms - Volume status stable on exam. Not on lasix. - On Entresto  but only taking once daily (intolerant to bid dosing>>dizziness)  - Continue spiro 12.5mg  daily. - Continue carvedilol 6.25 BID.  - Reinforced fluid restriction to < 2 L daily, sodium restriction to less than 2000 mg daily, and the importance of daily weights.    2. CAD s/p CABG in 2017 - No s/s of ischemia.    - Continue b-blocker and ASA (with mechanical valves will not stop ASA in presence of warfarin) -PCP follows lipid panel. Pt reports he stopped statin b/c cholesterol was ok. Will request lab results be faxed for review. Recommended LDL goal < 70.   3. S/p mechanical AVR - Continue ASA and coumadin. INR followed by Dr. Caryl Comes  - No abnormal bleeding or falls.  - Reminded him of need for SBE prophylaxis   4. Post-op AF - has not recurred. No A Fib on ICD interrogations.   5. Frequent PVCs - Has [employer] CRT-D. Interrogated personally in clinic. No VT/ AF - Continue amiodarone 200 mg daily  - TSH/LFTs monitored by PCP.   6. Essential thrombocytosis - Continue hydroxyurea - Follow up with Hematology.  - No change to current plan.    7. AAA - Abd [place] 05/2018 showed mild increase in size of the known distal fusiform abdominal aortic aneurysm, measuring 4.2 cm in maximum diameter. -Followed by Dr. Oneida Alar.  -Continue  blocker for BP and HR control   8. ETOH abuse - Drinking 2 beers daily. -on ASA and coumadin. Denies abnormal bleeding. No falls.    Lyda Jester, PA-C  1:58 PM    Patient seen and  examined with the above-signed Advanced Practice Provider and/or Housestaff. I personally reviewed laboratory data, imaging studies and relevant notes. I independently examined the patient and formulated the important aspects of the plan. I have edited the note to reflect any of my changes or salient points. I have personally discussed the plan with the patient and/or family.  Doing well. NYHA II. Volume status ok. Echo today reviewed personally. EF 25-30 AVR stable. No angina. AAA followed by Dr. Oneida Alar.  Can only tolerate Entresto once a day and not willing to attempt bid dosing again. Wil check with PharmD to make sure he can continue to get help with [facility]. Will start Farxiga.   Glori Bickers, MD  2:57 PM

## 2019-03-20 NOTE — Progress Notes (Signed)
  Echocardiogram 2D Echocardiogram has been performed.  Taylor [name] 03/20/2019, 1:58 PM

## 2019-03-20 NOTE — Patient Instructions (Signed)
It was great to see you today! No medication changes are needed at this time.  Your physician recommends that you schedule a follow-up appointment in: 12 months with Dr Haroldine Laws  Do the following things EVERYDAY: 1) Weigh yourself in the morning before breakfast. Write it down and keep it in a log. 2) Take your medicines as prescribed 3) Eat low salt foods-Limit salt (sodium) to 2000 mg per day.  4) Stay as active as you can everyday 5) Limit all fluids for the day to less than 2 liters  At the [facility], you and your health needs are our priority. As part of our continuing mission to provide you with exceptional heart care, we have created designated Provider Care Teams. These Care Teams include your primary Cardiologist (physician) and Advanced Practice Providers (APPs- Physician Assistants and Nurse Practitioners) who all work together to provide you with the care you need, when you need it.   You may see any of the following providers on your designated Care Team at your next follow up: Marland Kitchen Dr Glori Bickers . Dr Loralie Champagne . Darrick Grinder, NP . Lyda Jester, PA   Please be sure to bring in all your medications bottles to every appointment.

## 2019-03-21 ENCOUNTER — Telehealth (HOSPITAL_COMMUNITY): Payer: Self-pay | Admitting: Pharmacist

## 2019-03-21 ENCOUNTER — Telehealth (HOSPITAL_COMMUNITY): Payer: Self-pay

## 2019-03-21 MED ORDER — ATORVASTATIN CALCIUM 40 MG PO TABS: 40.0000 mg | ORAL_TABLET | Freq: Every day | ORAL | 1 refills | Status: DC

## 2019-03-21 NOTE — Telephone Encounter (Signed)
Received message that patient will need Manufacturer's Assistance through [employer] for [place].  Contacted patient that we will need him to sign the application and provide an out-of-pocket expense report before submitting application. Left application at the front desk for him to sign.  Additionally, he will continue to receive Entresto from [employer] through patient assistance until 06/06/2019. At that time, will resubmit application for [facility].   Audry Riles, PharmD, BCPS, CPP Heart Failure Clinic Pharmacist [phone]

## 2019-03-21 NOTE — Telephone Encounter (Signed)
Called patient with lab results. Advised of elevated LDL level. Per PA Rosita Fire, pt to resume atorvastatin 40mg  nightly.  Pt will have lipids and lft's rechecked in 2 months.  Pt aware of same. Pt verbalized understanding.  Rx for lab work mailed to patient.

## 2019-03-26 ENCOUNTER — Other Ambulatory Visit (HOSPITAL_COMMUNITY): Payer: Self-pay | Admitting: Internal Medicine

## 2019-03-31 ENCOUNTER — Other Ambulatory Visit: Payer: Self-pay

## 2019-03-31 ENCOUNTER — Ambulatory Visit (INDEPENDENT_AMBULATORY_CARE_PROVIDER_SITE_OTHER): Payer: Medicare Other | Admitting: *Deleted

## 2019-03-31 DIAGNOSIS — Z5181 Encounter for therapeutic drug level monitoring: Secondary | ICD-10-CM | POA: Diagnosis not present

## 2019-03-31 DIAGNOSIS — I5022 Chronic systolic (congestive) heart failure: Secondary | ICD-10-CM

## 2019-03-31 LAB — POCT INR: INR: 3.2 — AB (ref 2.0–3.0)

## 2019-03-31 NOTE — Patient Instructions (Signed)
Decrease warfarin to 1/2 tablet daily  Recheck INR in 4 weeks.  Coumadin Clinic [phone]

## 2019-04-08 NOTE — Telephone Encounter (Signed)
Sent in [employer] Assistance application to [place] for [place].   Phone: [phone] Fax: [phone]  Application pending, will continue to follow.  Audry Riles, PharmD, BCPS, CPP Heart Failure Clinic Pharmacist [phone]

## 2019-04-14 ENCOUNTER — Other Ambulatory Visit: Payer: Self-pay | Admitting: Internal Medicine

## 2019-04-21 NOTE — Telephone Encounter (Signed)
Advanced Heart Failure Patient Advocate Encounter   Patient was approved to receive Farxiga from [employer].  Effective dates: 04/10/2019 through 04/08/2020  Audry Riles, PharmD, BCPS, BCCP, CPP Heart Failure Clinic Pharmacist [phone]

## 2019-04-30 ENCOUNTER — Ambulatory Visit (INDEPENDENT_AMBULATORY_CARE_PROVIDER_SITE_OTHER): Payer: Medicare Other | Admitting: *Deleted

## 2019-04-30 ENCOUNTER — Other Ambulatory Visit: Payer: Self-pay

## 2019-04-30 DIAGNOSIS — I5022 Chronic systolic (congestive) heart failure: Secondary | ICD-10-CM

## 2019-04-30 DIAGNOSIS — Z5181 Encounter for therapeutic drug level monitoring: Secondary | ICD-10-CM

## 2019-04-30 LAB — POCT INR: INR: 1.6 — AB (ref 2.0–3.0)

## 2019-04-30 NOTE — Patient Instructions (Signed)
Take warfarin 1 tablet tonight and tomorrow night then resume 1/2 tablet daily  Recheck INR in 3 weeks.  Coumadin Clinic [phone]

## 2019-05-05 ENCOUNTER — Telehealth (HOSPITAL_COMMUNITY): Payer: Self-pay | Admitting: Cardiology

## 2019-05-05 NOTE — Telephone Encounter (Signed)
Abnormal labs received Labs drawn 04/30/19  Cholesterol 164 Triglycerides 164 HDL 55 LDL 76 RBC 3.25 HGB 14 HCT 39.4  Per Amy Clegg,NP Patient should increase atorvastatin to 80 mg daily   Pt aware and voiced understanding  Pt reports he received farxiga in the mail and wondered if he should start since he is still taking entresto, advised that farxiga was prescribed in addition to entresto  Patient advised he should continue current dose of hydroxyurea as medication change was not warranted with results.  Patient also would like to know if he can take triazolam for sleep, reports he spoke with cardiology office in [place] and he was previously on this medication. Advised I would forward to Dr Haroldine Laws

## 2019-05-07 ENCOUNTER — Other Ambulatory Visit (HOSPITAL_COMMUNITY): Payer: Self-pay | Admitting: Internal Medicine

## 2019-05-08 ENCOUNTER — Ambulatory Visit (INDEPENDENT_AMBULATORY_CARE_PROVIDER_SITE_OTHER): Payer: Medicare Other | Admitting: *Deleted

## 2019-05-08 DIAGNOSIS — I5022 Chronic systolic (congestive) heart failure: Secondary | ICD-10-CM | POA: Diagnosis not present

## 2019-05-08 LAB — CUP PACEART REMOTE DEVICE CHECK
Battery Remaining Longevity: 96 mo
Battery Remaining Percentage: 100 %
Brady Statistic RA Percent Paced: 8 %
Brady Statistic RV Percent Paced: 90 %
Date Time Interrogation Session: 20201210004000
HighPow Impedance: 73 Ohm
Implantable Lead Implant Date: 20180105
Implantable Lead Implant Date: 20180105
Implantable Lead Implant Date: 20180105
Implantable Lead Location: 753858
Implantable Lead Location: 753859
Implantable Lead Location: 753860
Implantable Lead Model: 292
Implantable Lead Model: 4672
Implantable Lead Model: 7741
Implantable Lead Serial Number: 428973
Implantable Lead Serial Number: 801088
Implantable Lead Serial Number: 849519
Implantable Pulse Generator Implant Date: 20180105
Lead Channel Impedance Value: 611 Ohm
Lead Channel Impedance Value: 755 Ohm
Lead Channel Impedance Value: 782 Ohm
Lead Channel Pacing Threshold Amplitude: 0.6 V
Lead Channel Pacing Threshold Amplitude: 0.7 V
Lead Channel Pacing Threshold Amplitude: 0.9 V
Lead Channel Pacing Threshold Pulse Width: 0.4 ms
Lead Channel Pacing Threshold Pulse Width: 0.4 ms
Lead Channel Pacing Threshold Pulse Width: 1 ms
Lead Channel Setting Pacing Amplitude: 2 V
Lead Channel Setting Pacing Amplitude: 2 V
Lead Channel Setting Pacing Amplitude: 2.2 V
Lead Channel Setting Pacing Pulse Width: 0.4 ms
Lead Channel Setting Pacing Pulse Width: 1 ms
Lead Channel Setting Sensing Sensitivity: 0.6 mV
Lead Channel Setting Sensing Sensitivity: 1 mV
Pulse Gen Serial Number: 180139

## 2019-05-16 ENCOUNTER — Other Ambulatory Visit (HOSPITAL_COMMUNITY): Payer: Self-pay

## 2019-05-16 MED ORDER — ENTRESTO 24-26 MG PO TABS: 1.0000 | ORAL_TABLET | Freq: Every day | ORAL | 3 refills | Status: DC

## 2019-05-19 ENCOUNTER — Other Ambulatory Visit (HOSPITAL_COMMUNITY): Payer: Self-pay

## 2019-05-19 ENCOUNTER — Telehealth (HOSPITAL_COMMUNITY): Payer: Self-pay | Admitting: Pharmacy Technician

## 2019-05-19 MED ORDER — ENTRESTO 24-26 MG PO TABS: 1.0000 | ORAL_TABLET | Freq: Every day | ORAL | 3 refills | Status: DC

## 2019-05-19 NOTE — Telephone Encounter (Signed)
Spoke to patient regarding re-enrollment of Entresto in [employer]. Will mail the application for the patient to sign. Patient is aware that since he has signed up for a prescription supplement that we may have to send that card in as well once he receives it.   Will follow up.  Charlann Boxer, CPhT

## 2019-05-21 ENCOUNTER — Ambulatory Visit (INDEPENDENT_AMBULATORY_CARE_PROVIDER_SITE_OTHER): Payer: Medicare Other | Admitting: *Deleted

## 2019-05-21 ENCOUNTER — Other Ambulatory Visit: Payer: Self-pay

## 2019-05-21 DIAGNOSIS — I5022 Chronic systolic (congestive) heart failure: Secondary | ICD-10-CM | POA: Diagnosis not present

## 2019-05-21 DIAGNOSIS — Z5181 Encounter for therapeutic drug level monitoring: Secondary | ICD-10-CM

## 2019-05-21 DIAGNOSIS — Z952 Presence of prosthetic heart valve: Secondary | ICD-10-CM | POA: Diagnosis not present

## 2019-05-21 LAB — POCT INR: INR: 7.7 — AB (ref 2.0–3.0)

## 2019-05-21 NOTE — Patient Instructions (Signed)
Hold warfarin x 4 days then resume 1/2 tablet daily  Recheck Mondays 12/28  Coumadin Clinic [phone]

## 2019-05-26 ENCOUNTER — Other Ambulatory Visit: Payer: Self-pay

## 2019-05-26 ENCOUNTER — Ambulatory Visit (INDEPENDENT_AMBULATORY_CARE_PROVIDER_SITE_OTHER): Payer: Medicare Other | Admitting: *Deleted

## 2019-05-26 DIAGNOSIS — Z5181 Encounter for therapeutic drug level monitoring: Secondary | ICD-10-CM

## 2019-05-26 DIAGNOSIS — Z952 Presence of prosthetic heart valve: Secondary | ICD-10-CM | POA: Diagnosis not present

## 2019-05-26 DIAGNOSIS — I5022 Chronic systolic (congestive) heart failure: Secondary | ICD-10-CM

## 2019-05-26 LAB — POCT INR: INR: 3 (ref 2.0–3.0)

## 2019-05-26 NOTE — Patient Instructions (Signed)
Decrease warfarin to 1/2 tablet daily except none on Tuesdays Recheck in 2 weeks  Coumadin Clinic [phone]

## 2019-06-04 ENCOUNTER — Telehealth (HOSPITAL_COMMUNITY): Payer: Self-pay

## 2019-06-04 NOTE — Telephone Encounter (Signed)
Received message from Ander Purpura, PharmD stating PAP application for [employer] missing MD portion.  MD portion completed, signed by MD, and faxed to [employer].

## 2019-06-09 ENCOUNTER — Other Ambulatory Visit: Payer: Self-pay

## 2019-06-09 ENCOUNTER — Ambulatory Visit (INDEPENDENT_AMBULATORY_CARE_PROVIDER_SITE_OTHER): Payer: Medicare Other | Admitting: *Deleted

## 2019-06-09 DIAGNOSIS — Z5181 Encounter for therapeutic drug level monitoring: Secondary | ICD-10-CM

## 2019-06-09 DIAGNOSIS — Z952 Presence of prosthetic heart valve: Secondary | ICD-10-CM | POA: Diagnosis not present

## 2019-06-09 DIAGNOSIS — I5022 Chronic systolic (congestive) heart failure: Secondary | ICD-10-CM

## 2019-06-09 LAB — POCT INR: INR: 2.2 (ref 2.0–3.0)

## 2019-06-09 NOTE — Patient Instructions (Signed)
Will change warfarin to smaller strength tablet when pt runs out of 5mg  tablets for better adjustment. Increase warfarin to 1/2 tablet daily Increase greens and salads Recheck in 3 weeks  Coumadin Clinic [phone]

## 2019-06-11 ENCOUNTER — Other Ambulatory Visit (HOSPITAL_COMMUNITY): Payer: Self-pay

## 2019-06-11 MED ORDER — HYDROXYUREA 500 MG PO CAPS: ORAL_CAPSULE | ORAL | 3 refills | Status: DC

## 2019-06-13 ENCOUNTER — Telehealth: Payer: Self-pay | Admitting: *Deleted

## 2019-06-13 NOTE — Telephone Encounter (Signed)
Patient called requesting to know the correct dosage of his coumdin.

## 2019-06-13 NOTE — Telephone Encounter (Signed)
Returned call to pt, advised per last anticoagulation note on 06/09/19 pt was instructed to take 2.5mg  daily.  Pt states he had lost his printout from his OV and just needed to know dosage he was supposed to be taking.  Also advised pt f/u scheduled for 07/02/19.  Pt verbalized understanding and thanked me for the call back.

## 2019-06-14 NOTE — Progress Notes (Signed)
ICD remote 

## 2019-06-22 ENCOUNTER — Emergency Department (HOSPITAL_COMMUNITY): Payer: Medicare Other

## 2019-06-22 ENCOUNTER — Inpatient Hospital Stay (HOSPITAL_COMMUNITY)
Admission: EM | Admit: 2019-06-22 | Discharge: 2019-06-26 | DRG: 872 | Disposition: A | Discharge status: Home Health | Payer: Medicare Other | Attending: Internal Medicine | Admitting: Internal Medicine

## 2019-06-22 ENCOUNTER — Encounter (HOSPITAL_COMMUNITY): Payer: Self-pay | Admitting: Emergency Medicine

## 2019-06-22 ENCOUNTER — Other Ambulatory Visit: Payer: Self-pay

## 2019-06-22 DIAGNOSIS — E861 Hypovolemia: Secondary | ICD-10-CM | POA: Diagnosis present

## 2019-06-22 DIAGNOSIS — N179 Acute kidney failure, unspecified: Secondary | ICD-10-CM | POA: Diagnosis present

## 2019-06-22 DIAGNOSIS — Z7901 Long term (current) use of anticoagulants: Secondary | ICD-10-CM | POA: Diagnosis not present

## 2019-06-22 DIAGNOSIS — A419 Sepsis, unspecified organism: Secondary | ICD-10-CM | POA: Diagnosis present

## 2019-06-22 DIAGNOSIS — Z952 Presence of prosthetic heart valve: Secondary | ICD-10-CM | POA: Diagnosis not present

## 2019-06-22 DIAGNOSIS — Z7289 Other problems related to lifestyle: Secondary | ICD-10-CM

## 2019-06-22 DIAGNOSIS — E872 Acidosis, unspecified: Secondary | ICD-10-CM

## 2019-06-22 DIAGNOSIS — D473 Essential (hemorrhagic) thrombocythemia: Secondary | ICD-10-CM | POA: Diagnosis present

## 2019-06-22 DIAGNOSIS — Z20822 Contact with and (suspected) exposure to covid-19: Secondary | ICD-10-CM | POA: Diagnosis present

## 2019-06-22 DIAGNOSIS — K810 Acute cholecystitis: Secondary | ICD-10-CM | POA: Diagnosis present

## 2019-06-22 DIAGNOSIS — K819 Cholecystitis, unspecified: Secondary | ICD-10-CM | POA: Diagnosis not present

## 2019-06-22 DIAGNOSIS — R6521 Severe sepsis with septic shock: Secondary | ICD-10-CM | POA: Diagnosis not present

## 2019-06-22 DIAGNOSIS — I714 Abdominal aortic aneurysm, without rupture: Secondary | ICD-10-CM | POA: Diagnosis present

## 2019-06-22 DIAGNOSIS — R6520 Severe sepsis without septic shock: Secondary | ICD-10-CM | POA: Diagnosis present

## 2019-06-22 DIAGNOSIS — Z881 Allergy status to other antibiotic agents status: Secondary | ICD-10-CM | POA: Diagnosis not present

## 2019-06-22 DIAGNOSIS — I2510 Atherosclerotic heart disease of native coronary artery without angina pectoris: Secondary | ICD-10-CM | POA: Diagnosis present

## 2019-06-22 DIAGNOSIS — E860 Dehydration: Secondary | ICD-10-CM | POA: Diagnosis present

## 2019-06-22 DIAGNOSIS — I110 Hypertensive heart disease with heart failure: Secondary | ICD-10-CM | POA: Diagnosis present

## 2019-06-22 DIAGNOSIS — I10 Essential (primary) hypertension: Secondary | ICD-10-CM | POA: Diagnosis not present

## 2019-06-22 DIAGNOSIS — I5022 Chronic systolic (congestive) heart failure: Secondary | ICD-10-CM | POA: Diagnosis present

## 2019-06-22 DIAGNOSIS — Z951 Presence of aortocoronary bypass graft: Secondary | ICD-10-CM | POA: Diagnosis not present

## 2019-06-22 DIAGNOSIS — Z888 Allergy status to other drugs, medicaments and biological substances status: Secondary | ICD-10-CM | POA: Diagnosis not present

## 2019-06-22 DIAGNOSIS — E785 Hyperlipidemia, unspecified: Secondary | ICD-10-CM | POA: Diagnosis present

## 2019-06-22 DIAGNOSIS — Z79890 Hormone replacement therapy (postmenopausal): Secondary | ICD-10-CM

## 2019-06-22 DIAGNOSIS — D649 Anemia, unspecified: Secondary | ICD-10-CM | POA: Diagnosis present

## 2019-06-22 DIAGNOSIS — E039 Hypothyroidism, unspecified: Secondary | ICD-10-CM | POA: Diagnosis present

## 2019-06-22 DIAGNOSIS — K8062 Calculus of gallbladder and bile duct with acute cholecystitis without obstruction: Secondary | ICD-10-CM | POA: Diagnosis present

## 2019-06-22 DIAGNOSIS — I255 Ischemic cardiomyopathy: Secondary | ICD-10-CM | POA: Diagnosis present

## 2019-06-22 DIAGNOSIS — E876 Hypokalemia: Secondary | ICD-10-CM | POA: Diagnosis not present

## 2019-06-22 DIAGNOSIS — Z95810 Presence of automatic (implantable) cardiac defibrillator: Secondary | ICD-10-CM

## 2019-06-22 DIAGNOSIS — Z8674 Personal history of sudden cardiac arrest: Secondary | ICD-10-CM

## 2019-06-22 DIAGNOSIS — Z87891 Personal history of nicotine dependence: Secondary | ICD-10-CM

## 2019-06-22 DIAGNOSIS — Z7982 Long term (current) use of aspirin: Secondary | ICD-10-CM

## 2019-06-22 DIAGNOSIS — Z79899 Other long term (current) drug therapy: Secondary | ICD-10-CM

## 2019-06-22 DIAGNOSIS — I493 Ventricular premature depolarization: Secondary | ICD-10-CM | POA: Diagnosis not present

## 2019-06-22 DIAGNOSIS — E871 Hypo-osmolality and hyponatremia: Secondary | ICD-10-CM | POA: Diagnosis present

## 2019-06-22 DIAGNOSIS — I252 Old myocardial infarction: Secondary | ICD-10-CM

## 2019-06-22 DIAGNOSIS — K839 Disease of biliary tract, unspecified: Secondary | ICD-10-CM

## 2019-06-22 DIAGNOSIS — I088 Other rheumatic multiple valve diseases: Secondary | ICD-10-CM | POA: Diagnosis present

## 2019-06-22 LAB — RESPIRATORY PANEL BY RT PCR (FLU A&B, COVID)
Influenza A by PCR: NEGATIVE
Influenza B by PCR: NEGATIVE
SARS Coronavirus 2 by RT PCR: NEGATIVE

## 2019-06-22 LAB — COMPREHENSIVE METABOLIC PANEL
ALT: 257 U/L — ABNORMAL HIGH (ref 0–44)
AST: 295 U/L — ABNORMAL HIGH (ref 15–41)
Albumin: 3.2 g/dL — ABNORMAL LOW (ref 3.5–5.0)
Alkaline Phosphatase: 146 U/L — ABNORMAL HIGH (ref 38–126)
Anion gap: 13 (ref 5–15)
BUN: 26 mg/dL — ABNORMAL HIGH (ref 8–23)
CO2: 19 mmol/L — ABNORMAL LOW (ref 22–32)
Calcium: 8.9 mg/dL (ref 8.9–10.3)
Chloride: 101 mmol/L (ref 98–111)
Creatinine, Ser: 1.7 mg/dL — ABNORMAL HIGH (ref 0.61–1.24)
GFR calc Af Amer: 47 mL/min — ABNORMAL LOW (ref >60)
GFR calc non Af Amer: 40 mL/min — ABNORMAL LOW (ref >60)
Glucose, Bld: 135 mg/dL — ABNORMAL HIGH (ref 70–99)
Potassium: 3.8 mmol/L (ref 3.5–5.1)
Sodium: 133 mmol/L — ABNORMAL LOW (ref 135–145)
Total Bilirubin: 4.9 mg/dL — ABNORMAL HIGH (ref 0.3–1.2)
Total Protein: 6.5 g/dL (ref 6.5–8.1)

## 2019-06-22 LAB — CBC
HCT: 38.3 % — ABNORMAL LOW (ref 39.0–52.0)
Hemoglobin: 13.4 g/dL (ref 13.0–17.0)
MCH: 43.4 pg — ABNORMAL HIGH (ref 26.0–34.0)
MCHC: 35 g/dL (ref 30.0–36.0)
MCV: 123.9 fL — ABNORMAL HIGH (ref 80.0–100.0)
Platelets: 290 10*3/uL (ref 150–400)
RBC: 3.09 MIL/uL — ABNORMAL LOW (ref 4.22–5.81)
RDW: 15.5 % (ref 11.5–15.5)
WBC: 8.2 10*3/uL (ref 4.0–10.5)
nRBC: 0 % (ref 0.0–0.2)

## 2019-06-22 LAB — PROTIME-INR
INR: 2.4 — ABNORMAL HIGH (ref 0.8–1.2)
Prothrombin Time: 26.2 seconds — ABNORMAL HIGH (ref 11.4–15.2)

## 2019-06-22 LAB — HIV ANTIBODY (ROUTINE TESTING W REFLEX): HIV Screen 4th Generation wRfx: NONREACTIVE

## 2019-06-22 LAB — APTT: aPTT: 39 seconds — ABNORMAL HIGH (ref 24–36)

## 2019-06-22 LAB — LACTIC ACID, PLASMA: Lactic Acid, Venous: 1.3 mmol/L (ref 0.5–1.9)

## 2019-06-22 LAB — MAGNESIUM: Magnesium: 1.6 mg/dL — ABNORMAL LOW (ref 1.7–2.4)

## 2019-06-22 LAB — PHOSPHORUS: Phosphorus: 4.9 mg/dL — ABNORMAL HIGH (ref 2.5–4.6)

## 2019-06-22 LAB — LIPASE, BLOOD: Lipase: 14 U/L (ref 11–51)

## 2019-06-22 MED ORDER — SENNA 8.6 MG PO TABS
1.0000 | ORAL_TABLET | Freq: Two times a day (BID) | ORAL | Status: DC
  Administered 2019-06-22 – 2019-06-25 (×2): 8.6 mg via ORAL
  Filled 2019-06-22 (×4): qty 1

## 2019-06-22 MED ORDER — ONDANSETRON HCL 4 MG PO TABS: 4.0000 mg | ORAL_TABLET | Freq: Four times a day (QID) | ORAL | Status: DC | PRN

## 2019-06-22 MED ORDER — IOHEXOL 350 MG/ML SOLN: 100.0000 mL | Freq: Once | INTRAVENOUS | Status: DC | PRN

## 2019-06-22 MED ORDER — AMIODARONE HCL 200 MG PO TABS
200.0000 mg | ORAL_TABLET | Freq: Every day | ORAL | Status: DC
  Administered 2019-06-23 – 2019-06-25 (×3): 200 mg via ORAL
  Filled 2019-06-22 (×3): qty 1

## 2019-06-22 MED ORDER — HYDROXYUREA 500 MG PO CAPS
500.0000 mg | ORAL_CAPSULE | ORAL | Status: DC
  Administered 2019-06-24 – 2019-06-26 (×2): 500 mg via ORAL
  Filled 2019-06-22 (×2): qty 1

## 2019-06-22 MED ORDER — ACETAMINOPHEN 325 MG PO TABS: 650.0000 mg | ORAL_TABLET | Freq: Four times a day (QID) | ORAL | Status: DC | PRN

## 2019-06-22 MED ORDER — ASPIRIN EC 81 MG PO TBEC
81.0000 mg | DELAYED_RELEASE_TABLET | Freq: Every day | ORAL | Status: DC
  Administered 2019-06-23 – 2019-06-26 (×4): 81 mg via ORAL
  Filled 2019-06-22 (×4): qty 1

## 2019-06-22 MED ORDER — DOCUSATE SODIUM 100 MG PO CAPS
100.0000 mg | ORAL_CAPSULE | Freq: Two times a day (BID) | ORAL | Status: DC
  Administered 2019-06-22 – 2019-06-25 (×3): 100 mg via ORAL
  Filled 2019-06-22 (×6): qty 1

## 2019-06-22 MED ORDER — ACETAMINOPHEN 650 MG RE SUPP: 650.0000 mg | Freq: Four times a day (QID) | RECTAL | Status: DC | PRN

## 2019-06-22 MED ORDER — LEVOTHYROXINE SODIUM 50 MCG PO TABS
50.0000 ug | ORAL_TABLET | Freq: Every day | ORAL | Status: DC
  Administered 2019-06-23 – 2019-06-26 (×4): 50 ug via ORAL
  Filled 2019-06-22 (×4): qty 1

## 2019-06-22 MED ORDER — PIPERACILLIN-TAZOBACTAM 3.375 G IVPB
3.3750 g | Freq: Three times a day (TID) | INTRAVENOUS | Status: DC
  Administered 2019-06-23 – 2019-06-26 (×11): 3.375 g via INTRAVENOUS
  Filled 2019-06-22 (×11): qty 50

## 2019-06-22 MED ORDER — ONDANSETRON HCL 4 MG/2ML IJ SOLN: 4.0000 mg | Freq: Four times a day (QID) | INTRAMUSCULAR | Status: DC | PRN

## 2019-06-22 MED ORDER — SODIUM CHLORIDE 0.9 % IV BOLUS
1000.0000 mL | Freq: Once | INTRAVENOUS | Status: AC
  Administered 2019-06-22: 1000 mL via INTRAVENOUS

## 2019-06-22 MED ORDER — HYDROXYUREA 500 MG PO CAPS
1000.0000 mg | ORAL_CAPSULE | ORAL | Status: DC
  Administered 2019-06-23: 500 mg via ORAL
  Administered 2019-06-25: 1000 mg via ORAL
  Filled 2019-06-22 (×2): qty 2

## 2019-06-22 MED ORDER — SODIUM CHLORIDE 0.9 % IV SOLN
INTRAVENOUS | Status: DC | PRN
  Administered 2019-06-22: 1000 mL via INTRAVENOUS

## 2019-06-22 MED ORDER — PIPERACILLIN-TAZOBACTAM 3.375 G IVPB 30 MIN
3.3750 g | Freq: Once | INTRAVENOUS | Status: AC
  Administered 2019-06-22: 19:00:00 3.375 g via INTRAVENOUS
  Filled 2019-06-22: qty 50

## 2019-06-22 MED ORDER — OXYCODONE HCL 5 MG PO TABS
5.0000 mg | ORAL_TABLET | ORAL | Status: DC | PRN
  Administered 2019-06-22 – 2019-06-23 (×2): 5 mg via ORAL
  Filled 2019-06-22 (×2): qty 1

## 2019-06-22 MED ORDER — POLYETHYLENE GLYCOL 3350 17 G PO PACK
17.0000 g | PACK | Freq: Every day | ORAL | Status: DC | PRN
  Administered 2019-06-22: 23:00:00 17 g via ORAL
  Filled 2019-06-22: qty 1

## 2019-06-22 NOTE — ED Notes (Signed)
Ask patient for urine sample, patient stated that he did not need to urinate at the time.

## 2019-06-22 NOTE — Progress Notes (Signed)
ANTICOAGULATION CONSULT NOTE - Initial Consult  Pharmacy Consult for heparin Indication: mechanical valve   Patient Measurements: Heparin Dosing Weight:   Vital Signs: Temp: 97.8 F (36.6 C) (01/24 1559) Temp Source: Oral (01/24 1559) BP: 90/50 (01/24 1921) Pulse Rate: 82 (01/24 1857)  Labs: Recent Labs    06/22/19 1601  HGB 13.4  HCT 38.3*  PLT 290  APTT 39*  LABPROT 26.2*  INR 2.4*  CREATININE 1.70*    CrCl cannot be calculated (Unknown ideal weight.).   Medical History: Past Medical History:  Diagnosis Date  . Atherosclerotic heart disease of native coronary artery without angina pectoris   . Chronic anticoagulation 02/18/2018  . Chronic systolic heart failure ([place]) 02/02/2017  . Essential thrombocythemia ([place]) 02/15/2017  . Hyperlipidemia   . Hypertension   . Ischemic cardiomyopathy   . Leg pain, central, left 04/17/2017   Hx sciatica - pain similar; no red flag signs on hx or exam   . Presence of other heart-valve replacement   . Ventricular tachycardia [facility])       Assessment: 69 yo male admitted with abdominal pain. He is on warfarin for hx of aortic valve replacement. Current INR is 2.4 (therapeutic). Per the outpatient anticoagulation notes, his INR target is 2-3.    Goal of Therapy:  Heparin level 0.3-0.7 units/ml Monitor platelets by anticoagulation protocol: Yes   Plan:  -Start heparin when INR falls < 2   Harvel Quale 06/22/2019,8:14 PM

## 2019-06-22 NOTE — ED Notes (Signed)
MD made aware of BP. MD is not worried unless pt become symptomatic.

## 2019-06-22 NOTE — ED Notes (Signed)
IV team at bedside 

## 2019-06-22 NOTE — H&P (Addendum)
History and Physical    Taylor [name] [id number] DOB: 1950-08-06 DOA: 06/22/2019  PCP: Taylor Heinrich, DO Patient coming from: Home  I have personally briefly reviewed patient's old medical records in [place]  Chief Complaint: Abdominal pain, nausea, vomiting  HPI: Taylor [name] is a 69 y.o. male with medical history significant for CAD status post CABG, heart failure with reduced ejection fraction status post CRT-D, VT arrest in 2017, bicuspid aortic valve status post mechanical valve replacement on warfarin, AAA, hypertension, essential thrombocytosis, hypothyroidism and hyperlipidemia who presented to the ED with 1 day of fever, chills, abdominal pain, nausea, vomiting.  He endorses fatigue, jaundice, light-colored stools and dark urine.  He denies sick contacts at home, no recent dietary indiscretions.  Denies diarrhea, dysuria, hematuria, melena, hematochezia.  Denies chest pain or chest pressure.  No upper respiratory symptoms, no cough, no sputum production.  He denies prior history of gallstones or gallbladder disease.  ED Course: In the ED, patient is afebrile, mildly hypertensive (patient reports his baseline blood pressures approximately 90/60), not tachycardic and saturating comfortably on room air.  Labs notable for WBC 8.2, CO2 19, BUN 26, creatinine 1.70 (baseline creatinine appears to be 1.1-1.2), T bili 4.9, AST 295, ALT 257, lipase within normal limits, INR 2.4.  CT chest abdomen pelvis demonstrated evidence of cholelithiasis with acute cholecystitis and a nonruptured 4.6 x 4.5 cm infrarenal AAA, along with lingular infiltrate versus atelectasis and atelectasis versus scarring in the left lung base.  Patient's most recent echocardiogram demonstrates an EF of 20 to 25% with a mechanical aortic valve.  He reports that his last Coumadin dose was the day prior to presentation.  While in the ED, he was given 1 L normal saline and Zosyn.  General surgery was consulted and  recommended admission to hospital medicine prior to decision on operative intervention.  Review of Systems: As per HPI otherwise 10 point review of systems negative.   Past Medical History:  Diagnosis Date  . Atherosclerotic heart disease of native coronary artery without angina pectoris   . Chronic anticoagulation 02/18/2018  . Chronic systolic heart failure ([place]) 02/02/2017  . Essential thrombocythemia ([place]) 02/15/2017  . Hyperlipidemia   . Hypertension   . Ischemic cardiomyopathy   . Leg pain, central, left 04/17/2017   Hx sciatica - pain similar; no red flag signs on hx or exam   . Presence of other heart-valve replacement   . Ventricular tachycardia ([place])     History reviewed. No pertinent surgical history.   reports that he quit smoking about 3 years ago. His smoking use included cigarettes. He started smoking about 40 years ago. He has a 30.00 pack-year smoking history. He has never used smokeless tobacco. He reports current alcohol use of about 14.0 standard drinks of alcohol per week. He reports that he does not use drugs.  Allergies  Allergen Reactions  . Erythromycin Rash  . Atorvastatin Calcium Other (See Comments)  . Rosuvastatin Calcium   . Simvastatin   . Zithromax [Azithromycin] Rash    Family history reviewed and noncontributory.  Prior to Admission medications   Medication Sig Start Date End Date Taking? Authorizing Provider  amiodarone (PACERONE) 200 MG tablet TAKE 1 TABLET(200 MG) BY MOUTH DAILY 12/26/18   Bensimhon, Shaune Pascal, MD  aspirin 81 MG tablet Take 1 tablet by mouth.    [provider]  atorvastatin (LIPITOR) 40 MG tablet Take 1 tablet (40 mg total) by mouth at bedtime. 03/21/19 06/19/19  Lyda Jester  M, PA-C  carvedilol (COREG) 6.25 MG tablet Take 1 tablet (6.25 mg total) by mouth 2 (two) times daily with a meal. 02/27/19   Bensimhon, Shaune Pascal, MD  diphenhydrAMINE (BENADRYL) 25 MG tablet Take 25 mg by mouth at bedtime as needed.     [provider]  hydroxyurea (HYDREA) 500 MG capsule TAKE 1 CAPSULE BY MOUTH ALTERNATING WITH 2 CAPSULES DAILY(500 MG ONE DAY AND 1000 MG NEXT DAY). 06/11/19   Bensimhon, Shaune Pascal, MD  levothyroxine (SYNTHROID, LEVOTHROID) 25 MCG tablet Take 2 tablets (50 mcg total) by mouth daily. Additional refills should come from PCP 06/05/18   Bensimhon, Shaune Pascal, MD  sacubitril-valsartan (ENTRESTO) 24-26 MG Take 1 tablet by mouth daily. 05/19/19   Bensimhon, Shaune Pascal, MD  spironolactone (ALDACTONE) 25 MG tablet TAKE 1/2 TABLET(12.5 MG) BY MOUTH DAILY 10/14/18   Bensimhon, Shaune Pascal, MD  warfarin (COUMADIN) 5 MG tablet Take as directed by coumadin clinic 02/17/19   Bensimhon, Shaune Pascal, MD    Physical Exam: Vitals:   06/22/19 1850 06/22/19 1857 06/22/19 1857 06/22/19 1921  BP:  (!) 81/47  (!) 90/50  Pulse: 82 80 82   Resp: (!) 26 20 (!) 26   Temp:      TempSrc:      SpO2: 96% 93%      Constitutional: NAD, calm, comfortable Eyes: PERRL, lids and conjunctivae normal ENMT: Mucous membranes are dry. Posterior pharynx clear of any exudate or lesions. Neck: normal, supple, no masses Respiratory: clear to auscultation bilaterally, no wheezing, no crackles. Normal respiratory effort. Cardiovascular: Regular rate and rhythm, 2/6 systolic murmur at right upper sternal border. No extremity edema. 2+ pedal pulses. Abdomen: Diffuse abdominal tenderness most pronounced in the right upper quadrant. Bowel sounds positive.  Musculoskeletal: no clubbing / cyanosis. No joint deformity upper and lower extremities. Good ROM, no contractures. Normal muscle tone.  Skin: no rashes, lesions, ulcers. No induration Neurologic: CN 2-12 grossly intact. Strength 5/5 in all 4.  Psychiatric: Normal judgment and insight. Alert and oriented x 3. Normal mood.    Labs on Admission: I have personally reviewed following labs and imaging studies  CBC: Recent Labs  Lab 06/22/19 1601  WBC 8.2  HGB 13.4  HCT 38.3*  MCV 123.9*    PLT [id number]   Basic Metabolic Panel: Recent Labs  Lab 06/22/19 1601  NA 133*  K 3.8  CL 101  CO2 19*  GLUCOSE 135*  BUN 26*  CREATININE 1.70*  CALCIUM 8.9   GFR: CrCl cannot be calculated (Unknown ideal weight.). Liver Function Tests: Recent Labs  Lab 06/22/19 1601  AST 295*  ALT 257*  ALKPHOS 146*  BILITOT 4.9*  PROT 6.5  ALBUMIN 3.2*   Recent Labs  Lab 06/22/19 1601  LIPASE 14   No results for input(s): AMMONIA in the last 168 hours. Coagulation Profile: Recent Labs  Lab 06/22/19 1601  INR 2.4*   Cardiac Enzymes: No results for input(s): CKTOTAL, CKMB, CKMBINDEX, TROPONINI in the last 168 hours. BNP (last 3 results) No results for input(s): PROBNP in the last 8760 hours. HbA1C: No results for input(s): HGBA1C in the last 72 hours. CBG: No results for input(s): GLUCAP in the last 168 hours. Lipid Profile: No results for input(s): CHOL, HDL, LDLCALC, TRIG, CHOLHDL, LDLDIRECT in the last 72 hours. Thyroid Function Tests: No results for input(s): TSH, T4TOTAL, FREET4, T3FREE, THYROIDAB in the last 72 hours. Anemia Panel: No results for input(s): VITAMINB12, FOLATE, FERRITIN, TIBC, IRON, RETICCTPCT in the last  72 hours. Urine analysis: No results found for: COLORURINE, APPEARANCEUR, [place], [place], GLUCOSEU, HGBUR, BILIRUBINUR, KETONESUR, PROTEINUR, UROBILINOGEN, NITRITE, LEUKOCYTESUR  Radiological Exams on Admission: CT ABDOMEN PELVIS WO CONTRAST  Result Date: 06/22/2019 CLINICAL DATA:  Abdominal pain. EXAM: CT CHEST, ABDOMEN AND PELVIS WITHOUT CONTRAST TECHNIQUE: Multidetector CT imaging of the chest, abdomen and pelvis was performed following the standard protocol without IV contrast. COMPARISON:  None. FINDINGS: CT CHEST FINDINGS Cardiovascular: There is a dual lead AICD. There is marked severity calcification of the thoracic aorta without evidence of aneurysmal dilatation. An artificial aortic valve is noted. Normal heart size. No pericardial effusion.  Marked severity coronary artery calcification is seen. Mediastinum/Nodes: Lungs/Pleura: Mild areas of atelectasis and/or infiltrate are seen within the left upper lobe and posterior aspect of the left lung base. There is no evidence of a pleural effusion or pneumothorax. Musculoskeletal: Multiple sternal wires are seen. Multilevel degenerative changes are noted throughout the thoracic spine. CT ABDOMEN PELVIS FINDINGS Hepatobiliary: No focal liver abnormality is seen. Numerous tiny gallstones are seen within the lumen of a partially contracted gallbladder. A very mild amount of pericholecystic inflammation is noted. Pancreas: Unremarkable. No pancreatic ductal dilatation or surrounding inflammatory changes. Spleen: Normal in size without focal abnormality. Adrenals/Urinary Tract: Adrenal glands are unremarkable. Kidneys are normal in size, without renal calculi or hydronephrosis. Small areas of parenchymal calcification and adjacent focal scarring are seen within the lower pole the left kidney. A 2.5 cm x 1.6 cm adjacent area of low attenuation is seen within the posterior aspect of the lower pole on the left (axial CT image 37, CT series number 8). Bladder is unremarkable. Stomach/Bowel: Stomach is within normal limits. The appendix is not identified. No evidence of bowel wall thickening, distention, or inflammatory changes. Vascular/Lymphatic: There is marked severity aortic atherosclerosis. 4.6 cm x 4.5 cm aneurysmal dilatation of the infrarenal abdominal aorta is seen. No enlarged abdominal or pelvic lymph nodes. Reproductive: The prostate gland is mildly enlarged. Other: No abdominal wall hernia or abnormality. No abdominopelvic ascites. Musculoskeletal: Multilevel degenerative changes are seen throughout the lumbar spine. IMPRESSION: 1. Cholelithiasis with additional findings suggestive of acute cholecystitis. 2. 4.6 cm x 4.5 cm infrarenal abdominal aortic aneurysm. 3. Evidence of prior median sternotomy. 4.  Artificial aortic valve. 5. Focal scarring and calcification within the lower pole of the left kidney with an adjacent area of low attenuation which may represent a small renal cyst. Correlation with renal ultrasound is recommended. Electronically Signed   By: Virgina Norfolk M.D.   On: 06/22/2019 17:59   CT CHEST WO CONTRAST  Result Date: 06/22/2019 CLINICAL DATA:  Abdominal pain. EXAM: CT CHEST, ABDOMEN AND PELVIS WITHOUT CONTRAST TECHNIQUE: Multidetector CT imaging of the chest, abdomen and pelvis was performed following the standard protocol without IV contrast. COMPARISON:  None. FINDINGS: CT CHEST FINDINGS Cardiovascular: There is a dual lead AICD. There is marked severity calcification of the thoracic aorta without evidence of aneurysmal dilatation. An artificial aortic valve is noted. Normal heart size. No pericardial effusion. Marked severity coronary artery calcification is seen. Mediastinum/Nodes: Lungs/Pleura: Mild areas of atelectasis and/or infiltrate are seen within the left upper lobe and posterior aspect of the left lung base. There is no evidence of a pleural effusion or pneumothorax. Musculoskeletal: Multiple sternal wires are seen. Multilevel degenerative changes are noted throughout the thoracic spine. CT ABDOMEN PELVIS FINDINGS Hepatobiliary: No focal liver abnormality is seen. Numerous tiny gallstones are seen within the lumen of a partially contracted gallbladder. A very mild amount  of pericholecystic inflammation is noted. Pancreas: Unremarkable. No pancreatic ductal dilatation or surrounding inflammatory changes. Spleen: Normal in size without focal abnormality. Adrenals/Urinary Tract: Adrenal glands are unremarkable. Kidneys are normal in size, without renal calculi or hydronephrosis. Small areas of parenchymal calcification and adjacent focal scarring are seen within the lower pole the left kidney. A 2.5 cm x 1.6 cm adjacent area of low attenuation is seen within the posterior  aspect of the lower pole on the left (axial CT image 37, CT series number 8). Bladder is unremarkable. Stomach/Bowel: Stomach is within normal limits. The appendix is not identified. No evidence of bowel wall thickening, distention, or inflammatory changes. Vascular/Lymphatic: There is marked severity aortic atherosclerosis. 4.6 cm x 4.5 cm aneurysmal dilatation of the infrarenal abdominal aorta is seen. No enlarged abdominal or pelvic lymph nodes. Reproductive: The prostate gland is mildly enlarged. Other: No abdominal wall hernia or abnormality. No abdominopelvic ascites. Musculoskeletal: Multilevel degenerative changes are seen throughout the lumbar spine. IMPRESSION: 1. Cholelithiasis with additional findings suggestive of acute cholecystitis. 2. 4.6 cm x 4.5 cm infrarenal abdominal aortic aneurysm. 3. Evidence of prior median sternotomy. 4. Artificial aortic valve. 5. Focal scarring and calcification within the lower pole of the left kidney with an adjacent area of low attenuation which may represent a small renal cyst. Correlation with renal ultrasound is recommended. Electronically Signed   By: Virgina Norfolk M.D.   On: 06/22/2019 17:59    EKG: Independently reviewed. V paced rhythm.  Assessment/Plan Active Problems:   Acute cholecystitis   Severe sepsis 2/2 acute cholecystitis Mr. Massie presents with acute cholecystitis with fever, right upper quadrant abdominal pain and abnormal LFTs.  Surgery plans to defer operative intervention at this time given the patient's significant cardiac history, and would like further input regarding operative risk profile before intervening surgically. - Continue Zosyn - Continue volume resuscitation - Surgery following - NPO at midnight for possible operative intervention versus percutaneous cholecystostomy drainage - Obtain RUQ U/S - Hold home antihypertensives in the setting of acute infection - Follow up culture data - Daily CBC  Prerenal AKI Non  anion gap metabolic acidosis - IVF as above - Monitor I/O, renal function - Avoid nephrotoxic meds - Holding home Entresto, spironolactone - Trend BMP  Mechanical AVR on warfarin H/o CAD s/p CABG Ischemic cardiomyopathy s/p CRT-D H/o VT arrest in 2017 - Hold home warfarin in anticipation of operative intervention - Start heparin, pharmacy managed - Continue home amiodarone - Will need heparin bridge prior to d/c - Continue ASA, Lipitor - Holding home Entresto, Aldactone, Coreg  Essential thrombocytosis - Continue ASA, Hydrea per home  Hypothyroidism - Continue home Synthroid  DVT prophylaxis: Heparin Code Status: Full Disposition Plan: Home following operative intervention, warfarin bridge in 3-4 days Consults called: General Surgery Admission status: Inpatient, Progressive   Dyer Klug Sharene Butters MD [facility]  If 7PM-7AM, please contact night-coverage [email or web address] Password [facility]  06/22/2019, 8:23 PM

## 2019-06-22 NOTE — ED Notes (Signed)
Spoke to provider about patient blood pressure. Plan to monitor BP and will notify MD if MAP stays constantly below 65.

## 2019-06-22 NOTE — Consult Note (Signed)
Reason for Consult:abdominal pain; cholecystitis Referring Physician: Dr Genella Mech [facility] is an 69 y.o. male.  HPI: 69 year old male with congestive heart failure, coronary artery disease status post CABG, mechanical heart valve on Coumadin (last dose Saturday), essential thrombosis, known AAA developed what he states was pain around his umbilicus on Saturday.  He had multiple episodes of nausea and vomiting.  He also had fever.  He states that the pain was so bad at times it caused him to be double over.  Because he has a known AAA he was concerned that it may be worsening so he came to the emergency room today.  He was found to have cholecystitis and we were called to consult.  He reports that he was in his usual state of health this entire week with exception of maybe some constipation.  He had a small BM today.  He began to feel ill on Saturday.  He states that his baseline blood pressures are around 90/50.  He has an ejection fraction around 25%.  He generally drinks about 2 alcoholic beverages at night.  He denies any withdrawal.  He has had a prior appendectomy  CT of his chest and pelvis revealed a known infrarenal AAA.  He had gallstones and some inflammation around the gallbladder.  His bilirubin was elevated at 4.9, AST and ALT were in the mid 200s, alkaline phosphatase elevated at 146.  Creatinine elevated at 1.7.  White count 8000.  INR 2.4.  His blood pressure in the emergency room is in the 80s.  History of V. tach arrest in 2017.  Followed by the heart failure clinic.  Right upper quadrant ultrasound is pending.  His bile duct on CT imaging was unremarkable.  He lives in [place] by himself.  His sister lives close by. Past Medical History:  Diagnosis Date  . Atherosclerotic heart disease of native coronary artery without angina pectoris   . Chronic anticoagulation 02/18/2018  . Chronic systolic heart failure ([place]) 02/02/2017  . Essential thrombocythemia ([place])  02/15/2017  . Hyperlipidemia   . Hypertension   . Ischemic cardiomyopathy   . Leg pain, central, left 04/17/2017   Hx sciatica - pain similar; no red flag signs on hx or exam   . Presence of other heart-valve replacement   . Ventricular tachycardia ([place])     History reviewed. No pertinent surgical history.  No family history on file.  Social History:  reports that he quit smoking about 3 years ago. His smoking use included cigarettes. He started smoking about 40 years ago. He has a 30.00 pack-year smoking history. He has never used smokeless tobacco. He reports current alcohol use of about 14.0 standard drinks of alcohol per week. He reports that he does not use drugs.  Allergies:  Allergies  Allergen Reactions  . Erythromycin Rash  . Atorvastatin Calcium Other (See Comments)  . Rosuvastatin Calcium     Unknown  . Simvastatin     Unknown  . Zithromax [Azithromycin] Rash    Medications: I have reviewed the patient's current medications.  Results for orders placed or performed during the hospital encounter of 06/22/19 (from the past 48 hour(s))  Lipase, blood     Status: None   Collection Time: 06/22/19  4:01 PM  Result Value Ref Range   Lipase 14 11 - 51 U/L    Comment: Performed at [facility], [place] [address] [place], [place] [address]  Comprehensive metabolic panel     Status: Abnormal  Collection Time: 06/22/19  4:01 PM  Result Value Ref Range   Sodium 133 (L) 135 - 145 mmol/L   Potassium 3.8 3.5 - 5.1 mmol/L   Chloride 101 98 - 111 mmol/L   CO2 19 (L) 22 - 32 mmol/L   Glucose, Bld 135 (H) 70 - 99 mg/dL   BUN 26 (H) 8 - 23 mg/dL   Creatinine, Ser 1.70 (H) 0.61 - 1.24 mg/dL   Calcium 8.9 8.9 - 10.3 mg/dL   Total Protein 6.5 6.5 - 8.1 g/dL   Albumin 3.2 (L) 3.5 - 5.0 g/dL   AST 295 (H) 15 - 41 U/L   ALT 257 (H) 0 - 44 U/L   Alkaline Phosphatase 146 (H) 38 - 126 U/L   Total Bilirubin 4.9 (H) 0.3 - 1.2 mg/dL   GFR calc non Af Amer 40 (L) >60 mL/min   GFR  calc Af Amer 47 (L) >60 mL/min   Anion gap 13 5 - 15    Comment: Performed at [facility], [address], [place], [place] [address]  CBC     Status: Abnormal   Collection Time: 06/22/19  4:01 PM  Result Value Ref Range   WBC 8.2 4.0 - 10.5 K/uL   RBC 3.09 (L) 4.22 - 5.81 MIL/uL   Hemoglobin 13.4 13.0 - 17.0 g/dL   HCT 38.3 (L) 39.0 - 52.0 %   MCV 123.9 (H) 80.0 - 100.0 fL   MCH 43.4 (H) 26.0 - 34.0 pg   MCHC 35.0 30.0 - 36.0 g/dL   RDW 15.5 11.5 - 15.5 %   Platelets 290 150 - 400 K/uL   nRBC 0.0 0.0 - 0.2 %    Comment: Performed at [facility], [place] [address], [place], [place] [address]  Protime-INR     Status: Abnormal   Collection Time: 06/22/19  4:01 PM  Result Value Ref Range   Prothrombin Time 26.2 (H) 11.4 - 15.2 seconds   INR 2.4 (H) 0.8 - 1.2    Comment: (NOTE) INR goal varies based on device and disease states. Performed at [facility], [place] [address], [place], [place] [address]   APTT     Status: Abnormal   Collection Time: 06/22/19  4:01 PM  Result Value Ref Range   aPTT 39 (H) 24 - 36 seconds    Comment:        IF BASELINE aPTT IS ELEVATED, SUGGEST PATIENT RISK ASSESSMENT BE USED TO DETERMINE APPROPRIATE ANTICOAGULANT THERAPY. Performed at [facility], [place] [address], [place], [place] [address]   Respiratory Panel by RT PCR (Flu A&B, Covid) - Nasopharyngeal Swab     Status: None   Collection Time: 06/22/19  6:57 PM   Specimen: Nasopharyngeal Swab  Result Value Ref Range   SARS Coronavirus 2 by RT PCR NEGATIVE NEGATIVE    Comment: (NOTE) SARS-CoV-2 target nucleic acids are NOT DETECTED. The SARS-CoV-2 RNA is generally detectable in upper respiratoy specimens during the acute phase of infection. The lowest concentration of SARS-CoV-2 viral copies this assay can detect is 131 copies/mL. A negative result does not preclude SARS-Cov-2 infection and should not be used as the sole basis for treatment or other patient management  decisions. A negative result may occur with  improper specimen collection/handling, submission of specimen other than nasopharyngeal swab, presence of viral mutation(s) within the areas targeted by this assay, and inadequate number of viral copies (<131 copies/mL). A negative result must be combined  with clinical observations, patient history, and epidemiological information. The expected result is Negative. Fact Sheet for Patients:  PinkCheek.be Fact Sheet for Healthcare Providers:  GravelBags.it This test is not yet ap proved or cleared by the [place] FDA and  has been authorized for detection and/or diagnosis of SARS-CoV-2 by FDA under an Emergency Use Authorization (EUA). This EUA will remain  in effect (meaning this test can be used) for the duration of the COVID-19 declaration under Section 564(b)(1) of the Act, 21 U.S.C. section 360bbb-3(b)(1), unless the authorization is terminated or revoked sooner.    Influenza A by PCR NEGATIVE NEGATIVE   Influenza B by PCR NEGATIVE NEGATIVE    Comment: (NOTE) The Xpert Xpress SARS-CoV-2/FLU/RSV assay is intended as an aid in  the diagnosis of influenza from Nasopharyngeal swab specimens and  should not be used as a sole basis for treatment. Nasal washings and  aspirates are unacceptable for Xpert Xpress SARS-CoV-2/FLU/RSV  testing. Fact Sheet for Patients: PinkCheek.be Fact Sheet for Healthcare Providers: GravelBags.it This test is not yet approved or cleared by the [place] FDA and  has been authorized for detection and/or diagnosis of SARS-CoV-2 by  FDA under an Emergency Use Authorization (EUA). This EUA will remain  in effect (meaning this test can be used) for the duration of the  Covid-19 declaration under Section 564(b)(1) of the Act, 21  U.S.C. section 360bbb-3(b)(1), unless the authorization is    terminated or revoked. Performed at [facility], [place] [address] [place], [place] [address]     CT ABDOMEN PELVIS WO CONTRAST  Result Date: 06/22/2019 CLINICAL DATA:  Abdominal pain. EXAM: CT CHEST, ABDOMEN AND PELVIS WITHOUT CONTRAST TECHNIQUE: Multidetector CT imaging of the chest, abdomen and pelvis was performed following the standard protocol without IV contrast. COMPARISON:  None. FINDINGS: CT CHEST FINDINGS Cardiovascular: There is a dual lead AICD. There is marked severity calcification of the thoracic aorta without evidence of aneurysmal dilatation. An artificial aortic valve is noted. Normal heart size. No pericardial effusion. Marked severity coronary artery calcification is seen. Mediastinum/Nodes: Lungs/Pleura: Mild areas of atelectasis and/or infiltrate are seen within the left upper lobe and posterior aspect of the left lung base. There is no evidence of a pleural effusion or pneumothorax. Musculoskeletal: Multiple sternal wires are seen. Multilevel degenerative changes are noted throughout the thoracic spine. CT ABDOMEN PELVIS FINDINGS Hepatobiliary: No focal liver abnormality is seen. Numerous tiny gallstones are seen within the lumen of a partially contracted gallbladder. A very mild amount of pericholecystic inflammation is noted. Pancreas: Unremarkable. No pancreatic ductal dilatation or surrounding inflammatory changes. Spleen: Normal in size without focal abnormality. Adrenals/Urinary Tract: Adrenal glands are unremarkable. Kidneys are normal in size, without renal calculi or hydronephrosis. Small areas of parenchymal calcification and adjacent focal scarring are seen within the lower pole the left kidney. A 2.5 cm x 1.6 cm adjacent area of low attenuation is seen within the posterior aspect of the lower pole on the left (axial CT image 37, CT series number 8). Bladder is unremarkable. Stomach/Bowel: Stomach is within normal limits. The appendix is not identified. No evidence  of bowel wall thickening, distention, or inflammatory changes. Vascular/Lymphatic: There is marked severity aortic atherosclerosis. 4.6 cm x 4.5 cm aneurysmal dilatation of the infrarenal abdominal aorta is seen. No enlarged abdominal or pelvic lymph nodes. Reproductive: The prostate gland is mildly enlarged. Other: No abdominal wall hernia or abnormality. No abdominopelvic ascites. Musculoskeletal: Multilevel degenerative changes are seen throughout the lumbar spine. IMPRESSION:  1. Cholelithiasis with additional findings suggestive of acute cholecystitis. 2. 4.6 cm x 4.5 cm infrarenal abdominal aortic aneurysm. 3. Evidence of prior median sternotomy. 4. Artificial aortic valve. 5. Focal scarring and calcification within the lower pole of the left kidney with an adjacent area of low attenuation which may represent a small renal cyst. Correlation with renal ultrasound is recommended. Electronically Signed   By: Virgina Norfolk M.D.   On: 06/22/2019 17:59   CT CHEST WO CONTRAST  Result Date: 06/22/2019 CLINICAL DATA:  Abdominal pain. EXAM: CT CHEST, ABDOMEN AND PELVIS WITHOUT CONTRAST TECHNIQUE: Multidetector CT imaging of the chest, abdomen and pelvis was performed following the standard protocol without IV contrast. COMPARISON:  None. FINDINGS: CT CHEST FINDINGS Cardiovascular: There is a dual lead AICD. There is marked severity calcification of the thoracic aorta without evidence of aneurysmal dilatation. An artificial aortic valve is noted. Normal heart size. No pericardial effusion. Marked severity coronary artery calcification is seen. Mediastinum/Nodes: Lungs/Pleura: Mild areas of atelectasis and/or infiltrate are seen within the left upper lobe and posterior aspect of the left lung base. There is no evidence of a pleural effusion or pneumothorax. Musculoskeletal: Multiple sternal wires are seen. Multilevel degenerative changes are noted throughout the thoracic spine. CT ABDOMEN PELVIS FINDINGS  Hepatobiliary: No focal liver abnormality is seen. Numerous tiny gallstones are seen within the lumen of a partially contracted gallbladder. A very mild amount of pericholecystic inflammation is noted. Pancreas: Unremarkable. No pancreatic ductal dilatation or surrounding inflammatory changes. Spleen: Normal in size without focal abnormality. Adrenals/Urinary Tract: Adrenal glands are unremarkable. Kidneys are normal in size, without renal calculi or hydronephrosis. Small areas of parenchymal calcification and adjacent focal scarring are seen within the lower pole the left kidney. A 2.5 cm x 1.6 cm adjacent area of low attenuation is seen within the posterior aspect of the lower pole on the left (axial CT image 37, CT series number 8). Bladder is unremarkable. Stomach/Bowel: Stomach is within normal limits. The appendix is not identified. No evidence of bowel wall thickening, distention, or inflammatory changes. Vascular/Lymphatic: There is marked severity aortic atherosclerosis. 4.6 cm x 4.5 cm aneurysmal dilatation of the infrarenal abdominal aorta is seen. No enlarged abdominal or pelvic lymph nodes. Reproductive: The prostate gland is mildly enlarged. Other: No abdominal wall hernia or abnormality. No abdominopelvic ascites. Musculoskeletal: Multilevel degenerative changes are seen throughout the lumbar spine. IMPRESSION: 1. Cholelithiasis with additional findings suggestive of acute cholecystitis. 2. 4.6 cm x 4.5 cm infrarenal abdominal aortic aneurysm. 3. Evidence of prior median sternotomy. 4. Artificial aortic valve. 5. Focal scarring and calcification within the lower pole of the left kidney with an adjacent area of low attenuation which may represent a small renal cyst. Correlation with renal ultrasound is recommended. Electronically Signed   By: Virgina Norfolk M.D.   On: 06/22/2019 17:59   US Abdomen Limited RUQ  Result Date: 06/22/2019 CLINICAL DATA:  Biliary disease. EXAM: ULTRASOUND ABDOMEN  LIMITED RIGHT UPPER QUADRANT COMPARISON:  CT abdomen/pelvis 06/22/2019 FINDINGS: Gallbladder: Cholecystolithiasis. Visualized gallstones measure up to 6 mm. Thick-walled gallbladder measuring 11 mm. Pericholecystic fluid. A sonographic Murphy sign was elicited by the scanning technologist. Common bile duct: Diameter: 4 mm proximally, 5-6 mm distally. Liver: No focal lesion identified. Increased hepatic parenchymal echogenicity. Interrogated portal vein is patent on color Doppler imaging with normal direction of blood flow towards the liver. IMPRESSION: Cholecystolithiasis with gallbladder wall thickening and pericholecystic fluid. A sonographic Murphy sign was elicited by the scanning technologist. This constellation of  findings is highly suggestive of acute cholecystitis. Increased hepatic parenchymal echogenicity. While this is a nonspecific finding, increased density of the liver was demonstrated on CT performed earlier the same day. Query changes related to amiodarone therapy in this patient with a history of chronic heart failure. Electronically Signed   By: Kellie Simmering DO   On: 06/22/2019 21:40    Review of Systems  Constitutional: Positive for chills and fever.  Respiratory: Negative for chest tightness and shortness of breath.   Cardiovascular: Negative for chest pain and leg swelling.  Gastrointestinal: Positive for abdominal pain, constipation, nausea and vomiting.  All other systems reviewed and are negative.  Blood pressure (!) 81/50, pulse 80, temperature 97.8 F (36.6 C), temperature source Oral, resp. rate (!) 29, SpO2 94 %. Physical Exam  Vitals reviewed. Constitutional: He is oriented to person, place, and time. He appears well-developed and well-nourished.  Non-toxic appearance. No distress.  HENT:  Head: Normocephalic and atraumatic. Head is without abrasion and without contusion.  Right Ear: Hearing and external ear normal.  Left Ear: Hearing and external ear normal.  Nose:  Nose normal.  Lips intact  Eyes: Pupils are equal, round, and reactive to light. Conjunctivae are normal. Lids are everted and swept, no foreign bodies found. No scleral icterus.  Mild icterus  Neck: Trachea normal. No tracheal deviation present. No thyromegaly present.  Cardiovascular: Normal rate, regular rhythm, normal heart sounds and intact distal pulses.  Respiratory: Effort normal and breath sounds normal. No accessory muscle usage or stridor. No respiratory distress. He has no wheezes.  aicd  GI: Soft. Normal appearance. There is abdominal tenderness in the right upper quadrant. There is no rigidity and no rebound.    Rt mid abd TTP; +voluntary guarding  Musculoskeletal:        General: No tenderness or edema.     Right shoulder: Normal.     Left shoulder: Normal.     Cervical back: Normal range of motion and neck supple.     Right upper leg: Normal.     Left upper leg: Normal.     Right lower leg: Normal.     Left lower leg: Normal.  Lymphadenopathy:    He has no cervical adenopathy.       Right: No inguinal adenopathy present.       Left: No inguinal adenopathy present.  Neurological: He is alert and oriented to person, place, and time. He has normal strength. No cranial nerve deficit or sensory deficit. He exhibits normal muscle tone. GCS eye subscore is 4. GCS verbal subscore is 5. GCS motor subscore is 6.  Skin: Skin is warm and dry. No rash noted. He is not diaphoretic. No erythema. No pallor.  Psychiatric: He has a normal mood and affect. His behavior is normal. Judgment and thought content normal.    Assessment/Plan: Acute calculus cholecystitis SIRS Acute kidney injury Elevated LFTs Ischemic cardiomyopathy, ejection fraction 25% Coronary artery disease status post CABG Mechanical heart valve on Coumadin Alcohol use  N.p.o. except meds, sips of liquid IV antibiotic for cholecystitis Gentle fluid resuscitation Repeat labs in a.m. especially LFTs Hold  Coumadin; heparin bridge Check right upper quadrant ultrasound to evaluate bile duct Cardiology consult to help gauge operative risk  Leighton Ruff. Redmond Pulling, MD, FACS General, Bariatric, & Minimally Invasive Surgery [facility]   Greer Pickerel 06/22/2019, 9:58 PM

## 2019-06-22 NOTE — ED Provider Notes (Signed)
[facility] EMERGENCY DEPARTMENT Provider Note   CSN: [id number] Arrival date & time: 06/22/19  1550     History Chief Complaint  Patient presents with  . Abdominal Pain    Taylor [name] is a 69 y.o. male with CHF (EF 20-25%, s/p pacemaker), hypertension, hyperlipidemia, thrombocythemia, congestive heart failure, V. tach, AAA (4.2 cm in Jan 2020 [place] scan) on Coumadin, presenting to the emergency department with abdominal pain.  The patient reports gradual onset of abdominal pain near his umbilicus yesterday afternoon.  He says it is persistent and worsening over the course of the evening today.  He reports multiple episodes of vomiting today.  He denies any diarrhea or blood in his stool.  He reports a significant 10 out of 10 pain in his abdomen.  Has never had pain like this before.  He quit smoking many years ago.  HPI     Past Medical History:  Diagnosis Date  . Atherosclerotic heart disease of native coronary artery without angina pectoris   . Chronic anticoagulation 02/18/2018  . Chronic systolic heart failure ([place]) 02/02/2017  . Essential thrombocythemia ([place]) 02/15/2017  . Hyperlipidemia   . Hypertension   . Ischemic cardiomyopathy   . Leg pain, central, left 04/17/2017   Hx sciatica - pain similar; no red flag signs on hx or exam   . Presence of other heart-valve replacement   . Ventricular tachycardia [facility])     Patient Active Problem List   Diagnosis Date Noted  . Acute cholecystitis 06/22/2019  . Chronic anticoagulation 02/18/2018  . Leg pain, central, left 04/17/2017  . Essential thrombocythemia ([place]) 02/15/2017  . Encounter for therapeutic drug monitoring 02/15/2017  . Aortic valve replaced 02/15/2017  . Chronic systolic heart failure ([place]) 02/02/2017    History reviewed. No pertinent surgical history.     No family history on file.  Social History   Tobacco Use  . Smoking status: Former Smoker    Packs/day: 1.00    Years: 30.00   Pack years: 30.00    Types: Cigarettes    Start date: 05/26/1979    Quit date: 05/18/2016    Years since quitting: 3.0  . Smokeless tobacco: Never Used  Substance Use Topics  . Alcohol use: Yes    Alcohol/week: 14.0 standard drinks    Types: 14 Cans of beer per week    Comment: 2 cans a day..  . Drug use: No    Home Medications Prior to Admission medications   Medication Sig Start Date End Date Taking? Authorizing Provider  amiodarone (PACERONE) 200 MG tablet TAKE 1 TABLET(200 MG) BY MOUTH DAILY Patient taking differently: Take 200 mg by mouth daily.  12/26/18  Yes Bensimhon, Shaune Pascal, MD  aspirin 81 MG tablet Take 1 tablet by mouth.   Yes [provider]  atorvastatin (LIPITOR) 40 MG tablet Take 1 tablet (40 mg total) by mouth at bedtime. Patient taking differently: Take 40 mg by mouth 2 (two) times daily.  03/21/19 06/22/19 Yes Simmons, Brittainy M, PA-C  carvedilol (COREG) 6.25 MG tablet Take 1 tablet (6.25 mg total) by mouth 2 (two) times daily with a meal. 02/27/19  Yes Bensimhon, Shaune Pascal, MD  dapagliflozin propanediol (FARXIGA) 10 MG TABS tablet Take 10 mg by mouth daily.   Yes [provider]  diphenhydrAMINE (BENADRYL) 25 MG tablet Take 25 mg by mouth every 6 (six) hours as needed for sleep.    Yes [provider]  hydroxyurea (HYDREA) 500 MG capsule TAKE  1 CAPSULE BY MOUTH ALTERNATING WITH 2 CAPSULES DAILY(500 MG ONE DAY AND 1000 MG NEXT DAY). Patient taking differently: Take 500 mg by mouth See admin instructions. TAKE 1 CAPSULE BY MOUTH ALTERNATING WITH 2 CAPSULES DAILY(500 MG ONE DAY AND 1000 MG NEXT DAY). 06/11/19  Yes Bensimhon, Shaune Pascal, MD  levothyroxine (SYNTHROID, LEVOTHROID) 25 MCG tablet Take 2 tablets (50 mcg total) by mouth daily. Additional refills should come from PCP 06/05/18  Yes Bensimhon, Shaune Pascal, MD  sacubitril-valsartan (ENTRESTO) 24-26 MG Take 1 tablet by mouth daily. 05/19/19  Yes Bensimhon, Shaune Pascal, MD  spironolactone (ALDACTONE)  25 MG tablet TAKE 1/2 TABLET(12.5 MG) BY MOUTH DAILY Patient taking differently: Take 12.5 mg by mouth daily.  10/14/18  Yes Bensimhon, Shaune Pascal, MD  warfarin (COUMADIN) 5 MG tablet Take as directed by coumadin clinic Patient taking differently: Take 2.5 mg by mouth every evening. Take as directed by coumadin clinic 02/17/19  Yes Bensimhon, Shaune Pascal, MD    Allergies    Erythromycin, Atorvastatin calcium, Rosuvastatin calcium, Simvastatin, and Zithromax [azithromycin]  Review of Systems   Review of Systems  Constitutional: Positive for fatigue and fever. Negative for chills.  Respiratory: Negative for cough and shortness of breath.   Cardiovascular: Negative for chest pain and palpitations.  Gastrointestinal: Positive for abdominal pain, nausea and vomiting. Negative for blood in stool.  Musculoskeletal: Negative for arthralgias and back pain.  Skin: Negative for pallor and rash.  Neurological: Negative for syncope and speech difficulty.  Psychiatric/Behavioral: Negative for agitation and confusion.  All other systems reviewed and are negative.   Physical Exam Updated Vital Signs BP (!) 73/49   Pulse 80   Temp 97.8 F (36.6 C) (Oral)   Resp (!) 25   SpO2 96%   Physical Exam Vitals and nursing note reviewed.  Constitutional:      Appearance: He is well-developed.  HENT:     Head: Normocephalic and atraumatic.  Eyes:     Conjunctiva/sclera: Conjunctivae normal.  Cardiovascular:     Rate and Rhythm: Normal rate and regular rhythm.     Heart sounds: No murmur.  Pulmonary:     Effort: Pulmonary effort is normal. No respiratory distress.     Breath sounds: Normal breath sounds.  Abdominal:     Palpations: Abdomen is soft. There is no pulsatile mass.     Tenderness: There is abdominal tenderness. There is guarding and rebound.  Musculoskeletal:     Cervical back: Neck supple.  Skin:    General: Skin is warm and dry.  Neurological:     General: No focal deficit present.      Mental Status: He is alert and oriented to person, place, and time.     ED Results / Procedures / Treatments   Labs (all labs ordered are listed, but only abnormal results are displayed) Labs Reviewed  COMPREHENSIVE METABOLIC PANEL - Abnormal; Notable for the following components:      Result Value   Sodium 133 (*)    CO2 19 (*)    Glucose, Bld 135 (*)    BUN 26 (*)    Creatinine, Ser 1.70 (*)    Albumin 3.2 (*)    AST 295 (*)    ALT 257 (*)    Alkaline Phosphatase 146 (*)    Total Bilirubin 4.9 (*)    GFR calc non Af Amer 40 (*)    GFR calc Af Amer 47 (*)    All other components within normal limits  CBC - Abnormal; Notable for the following components:   RBC 3.09 (*)    HCT 38.3 (*)    MCV 123.9 (*)    MCH 43.4 (*)    All other components within normal limits  PROTIME-INR - Abnormal; Notable for the following components:   Prothrombin Time 26.2 (*)    INR 2.4 (*)    All other components within normal limits  APTT - Abnormal; Notable for the following components:   aPTT 39 (*)    All other components within normal limits  RESPIRATORY PANEL BY RT PCR (FLU A&B, COVID)  CULTURE, BLOOD (ROUTINE X 2)  CULTURE, BLOOD (ROUTINE X 2)  LIPASE, BLOOD  URINALYSIS, ROUTINE W REFLEX MICROSCOPIC  PROTIME-INR  HIV ANTIBODY (ROUTINE TESTING W REFLEX)  MAGNESIUM  PHOSPHORUS  BASIC METABOLIC PANEL  CBC  HEPATIC FUNCTION PANEL  LACTIC ACID, PLASMA    EKG EKG Interpretation  Date/Time:  Sunday June 22 2019 15:59:30 EST Ventricular Rate:  75 PR Interval:    QRS Duration: 158 QT Interval:  568 QTC Calculation: 634 R Axis:   -79 Text Interpretation: Ventricular-paced rhythm Biventricular pacemaker detected Abnormal ECG No significant changes from prior Confirmed by Octaviano Glow [phone]) on 06/22/2019 4:30:41 PM   Radiology CT ABDOMEN PELVIS WO CONTRAST  Result Date: 06/22/2019 CLINICAL DATA:  Abdominal pain. EXAM: CT CHEST, ABDOMEN AND PELVIS WITHOUT CONTRAST  TECHNIQUE: Multidetector CT imaging of the chest, abdomen and pelvis was performed following the standard protocol without IV contrast. COMPARISON:  None. FINDINGS: CT CHEST FINDINGS Cardiovascular: There is a dual lead AICD. There is marked severity calcification of the thoracic aorta without evidence of aneurysmal dilatation. An artificial aortic valve is noted. Normal heart size. No pericardial effusion. Marked severity coronary artery calcification is seen. Mediastinum/Nodes: Lungs/Pleura: Mild areas of atelectasis and/or infiltrate are seen within the left upper lobe and posterior aspect of the left lung base. There is no evidence of a pleural effusion or pneumothorax. Musculoskeletal: Multiple sternal wires are seen. Multilevel degenerative changes are noted throughout the thoracic spine. CT ABDOMEN PELVIS FINDINGS Hepatobiliary: No focal liver abnormality is seen. Numerous tiny gallstones are seen within the lumen of a partially contracted gallbladder. A very mild amount of pericholecystic inflammation is noted. Pancreas: Unremarkable. No pancreatic ductal dilatation or surrounding inflammatory changes. Spleen: Normal in size without focal abnormality. Adrenals/Urinary Tract: Adrenal glands are unremarkable. Kidneys are normal in size, without renal calculi or hydronephrosis. Small areas of parenchymal calcification and adjacent focal scarring are seen within the lower pole the left kidney. A 2.5 cm x 1.6 cm adjacent area of low attenuation is seen within the posterior aspect of the lower pole on the left (axial CT image 37, CT series number 8). Bladder is unremarkable. Stomach/Bowel: Stomach is within normal limits. The appendix is not identified. No evidence of bowel wall thickening, distention, or inflammatory changes. Vascular/Lymphatic: There is marked severity aortic atherosclerosis. 4.6 cm x 4.5 cm aneurysmal dilatation of the infrarenal abdominal aorta is seen. No enlarged abdominal or pelvic lymph  nodes. Reproductive: The prostate gland is mildly enlarged. Other: No abdominal wall hernia or abnormality. No abdominopelvic ascites. Musculoskeletal: Multilevel degenerative changes are seen throughout the lumbar spine. IMPRESSION: 1. Cholelithiasis with additional findings suggestive of acute cholecystitis. 2. 4.6 cm x 4.5 cm infrarenal abdominal aortic aneurysm. 3. Evidence of prior median sternotomy. 4. Artificial aortic valve. 5. Focal scarring and calcification within the lower pole of the left kidney with an adjacent area of low attenuation which may  represent a small renal cyst. Correlation with renal ultrasound is recommended. Electronically Signed   By: Virgina Norfolk M.D.   On: 06/22/2019 17:59   CT CHEST WO CONTRAST  Result Date: 06/22/2019 CLINICAL DATA:  Abdominal pain. EXAM: CT CHEST, ABDOMEN AND PELVIS WITHOUT CONTRAST TECHNIQUE: Multidetector CT imaging of the chest, abdomen and pelvis was performed following the standard protocol without IV contrast. COMPARISON:  None. FINDINGS: CT CHEST FINDINGS Cardiovascular: There is a dual lead AICD. There is marked severity calcification of the thoracic aorta without evidence of aneurysmal dilatation. An artificial aortic valve is noted. Normal heart size. No pericardial effusion. Marked severity coronary artery calcification is seen. Mediastinum/Nodes: Lungs/Pleura: Mild areas of atelectasis and/or infiltrate are seen within the left upper lobe and posterior aspect of the left lung base. There is no evidence of a pleural effusion or pneumothorax. Musculoskeletal: Multiple sternal wires are seen. Multilevel degenerative changes are noted throughout the thoracic spine. CT ABDOMEN PELVIS FINDINGS Hepatobiliary: No focal liver abnormality is seen. Numerous tiny gallstones are seen within the lumen of a partially contracted gallbladder. A very mild amount of pericholecystic inflammation is noted. Pancreas: Unremarkable. No pancreatic ductal dilatation or  surrounding inflammatory changes. Spleen: Normal in size without focal abnormality. Adrenals/Urinary Tract: Adrenal glands are unremarkable. Kidneys are normal in size, without renal calculi or hydronephrosis. Small areas of parenchymal calcification and adjacent focal scarring are seen within the lower pole the left kidney. A 2.5 cm x 1.6 cm adjacent area of low attenuation is seen within the posterior aspect of the lower pole on the left (axial CT image 37, CT series number 8). Bladder is unremarkable. Stomach/Bowel: Stomach is within normal limits. The appendix is not identified. No evidence of bowel wall thickening, distention, or inflammatory changes. Vascular/Lymphatic: There is marked severity aortic atherosclerosis. 4.6 cm x 4.5 cm aneurysmal dilatation of the infrarenal abdominal aorta is seen. No enlarged abdominal or pelvic lymph nodes. Reproductive: The prostate gland is mildly enlarged. Other: No abdominal wall hernia or abnormality. No abdominopelvic ascites. Musculoskeletal: Multilevel degenerative changes are seen throughout the lumbar spine. IMPRESSION: 1. Cholelithiasis with additional findings suggestive of acute cholecystitis. 2. 4.6 cm x 4.5 cm infrarenal abdominal aortic aneurysm. 3. Evidence of prior median sternotomy. 4. Artificial aortic valve. 5. Focal scarring and calcification within the lower pole of the left kidney with an adjacent area of low attenuation which may represent a small renal cyst. Correlation with renal ultrasound is recommended. Electronically Signed   By: Virgina Norfolk M.D.   On: 06/22/2019 17:59    Procedures .Critical Care Performed by: Wyvonnia Dusky, MD Authorized by: Wyvonnia Dusky, MD   Critical care provider statement:    Critical care time (minutes):  40   Critical care was necessary to treat or prevent imminent or life-threatening deterioration of the following conditions:  CNS failure or compromise   Critical care was time spent personally  by me on the following activities:  Discussions with consultants, evaluation of patient's response to treatment, examination of patient, ordering and performing treatments and interventions, ordering and review of laboratory studies, ordering and review of radiographic studies, pulse oximetry, re-evaluation of patient's condition, obtaining history from patient or surrogate and review of old charts Comments:     Frequent bedside evaluation, US-IV placement, stat CT imaging for dissection rule out, subsequent IV antibiotics and surgical consultation for intra-abdominal infection, IV fluids for hypotension   (including critical care time)  Medications Ordered in ED Medications  aspirin EC tablet  81 mg (has no administration in time range)  hydroxyurea (HYDREA) capsule 1,000 mg (has no administration in time range)  hydroxyurea (HYDREA) capsule 500 mg (has no administration in time range)  amiodarone (PACERONE) tablet 200 mg (has no administration in time range)  levothyroxine (SYNTHROID) tablet 50 mcg (has no administration in time range)  acetaminophen (TYLENOL) tablet 650 mg (has no administration in time range)    Or  acetaminophen (TYLENOL) suppository 650 mg (has no administration in time range)  oxyCODONE (Oxy IR/ROXICODONE) immediate release tablet 5 mg (has no administration in time range)  docusate sodium (COLACE) capsule 100 mg (has no administration in time range)  senna (SENOKOT) tablet 8.6 mg (has no administration in time range)  polyethylene glycol (MIRALAX / GLYCOLAX) packet 17 g (has no administration in time range)  ondansetron (ZOFRAN) tablet 4 mg (has no administration in time range)    Or  ondansetron (ZOFRAN) injection 4 mg (has no administration in time range)  piperacillin-tazobactam (ZOSYN) IVPB 3.375 g (has no administration in time range)  sodium chloride 0.9 % bolus 1,000 mL (0 mLs Intravenous Stopped 06/22/19 2030)  piperacillin-tazobactam (ZOSYN) IVPB 3.375 g (0 g  Intravenous Stopped 06/22/19 1916)    ED Course  I have reviewed the triage vital signs and the nursing notes.  Pertinent labs & imaging results that were available during my care of the patient were reviewed by me and considered in my medical decision making (see chart for details).  69 yo male with a history as noted above presented emerge department abdominal pain beginning yesterday.  Describes nausea and vomiting this morning.  He does have diffuse guarding of the abdomen.  Concern at the focus of his pain was located near the umbilicus where he says his aneurysm is.  I do not feel any pulsatile mass.  However based on his history and his hypotension (he is chronically hypotensive), I urgently took him to CT for a dissection/AAA study.  This scan ultimately revealed inflammation around the gallbladder.  This, in conjunction with his elevated liver enzymes, are more strongly suggestive of acute cholecystitis.  Patient was given IV Zosyn and IV fluids.  He was afebrile w/ no leukocytosis, no sepsis alert was initially.  Gen surg consulted as noted below.  No plan for immediate OR.  Patient admitted to hospitalist.  COVID testing negative  Clinical Course as of Jun 21 2048  Sun Jun 22, 2019  1630 USIV placed, called CT to request emergent scan and can defer waiting on Cr   [MT]  1719 Unfortunately we've attempted and lost 3 USIV's.  Patient at [facility].  I've instructed the team to perform the CT scans to look for clear evidence of dissection or retroperotoneal bleed while we work on vascular access   [MT]  1809 Given this CT report and clinical picture I suspect this is more likely acute cholecystitis, his LFT's are elevated, plan to c/s surgery, give IV zosyn, IV fluids, IV team is at bedside   [MT]  1908 BP historically low upon chart review, office visit last year was 21/52.  Patient is mentating well.  I do not believe this is shock but will continue to monitor.  He feels his abdominal pain is  improving   [MT]  1921 Dr Redmond Pulling of gen surgery recommending continuing IV antibiotics, NPO at midnight, RUQ ultrasound, medical admission and patient will need cardiology pre-operative assessment   [MT]  1921 Informed by nursing patient is only thorugh 500 cc of  his original first liter IVF< will continue and recheck BP   [MT]  1952 Admitted to hospitalist   [MT]    Clinical Course User Index [MT] Erhardt Dada, Carola Rhine, MD    Final Clinical Impression(s) / ED Diagnoses Final diagnoses:  Biliary disease  Cholecystitis    Rx / DC Orders ED Discharge Orders    None       Wyvonnia Dusky, MD 06/22/19 2050

## 2019-06-22 NOTE — ED Triage Notes (Signed)
Pt reports going to a basketball game yesterday. Starting last night, he got cotton-mouth, sweaty and abd pain. Reports hx of AAA but needs a recheck with Dr. Oneida Alar. Endorses weakness. Denies CP or SOB.

## 2019-06-23 ENCOUNTER — Encounter (HOSPITAL_COMMUNITY): Payer: Self-pay | Admitting: Internal Medicine

## 2019-06-23 DIAGNOSIS — D473 Essential (hemorrhagic) thrombocythemia: Secondary | ICD-10-CM

## 2019-06-23 DIAGNOSIS — K810 Acute cholecystitis: Secondary | ICD-10-CM

## 2019-06-23 DIAGNOSIS — R6521 Severe sepsis with septic shock: Secondary | ICD-10-CM

## 2019-06-23 DIAGNOSIS — N179 Acute kidney failure, unspecified: Secondary | ICD-10-CM

## 2019-06-23 DIAGNOSIS — E871 Hypo-osmolality and hyponatremia: Secondary | ICD-10-CM

## 2019-06-23 DIAGNOSIS — E872 Acidosis, unspecified: Secondary | ICD-10-CM

## 2019-06-23 DIAGNOSIS — I5022 Chronic systolic (congestive) heart failure: Secondary | ICD-10-CM

## 2019-06-23 DIAGNOSIS — A419 Sepsis, unspecified organism: Secondary | ICD-10-CM

## 2019-06-23 DIAGNOSIS — K819 Cholecystitis, unspecified: Secondary | ICD-10-CM

## 2019-06-23 DIAGNOSIS — D649 Anemia, unspecified: Secondary | ICD-10-CM

## 2019-06-23 DIAGNOSIS — I10 Essential (primary) hypertension: Secondary | ICD-10-CM

## 2019-06-23 LAB — CBC
HCT: 34.5 % — ABNORMAL LOW (ref 39.0–52.0)
Hemoglobin: 12.3 g/dL — ABNORMAL LOW (ref 13.0–17.0)
MCH: 43.9 pg — ABNORMAL HIGH (ref 26.0–34.0)
MCHC: 35.7 g/dL (ref 30.0–36.0)
MCV: 123.2 fL — ABNORMAL HIGH (ref 80.0–100.0)
Platelets: 232 10*3/uL (ref 150–400)
RBC: 2.8 MIL/uL — ABNORMAL LOW (ref 4.22–5.81)
RDW: 15.3 % (ref 11.5–15.5)
WBC: 7.3 10*3/uL (ref 4.0–10.5)
nRBC: 0 % (ref 0.0–0.2)

## 2019-06-23 LAB — HEPATIC FUNCTION PANEL
ALT: 242 U/L — ABNORMAL HIGH (ref 0–44)
AST: 268 U/L — ABNORMAL HIGH (ref 15–41)
Albumin: 2.8 g/dL — ABNORMAL LOW (ref 3.5–5.0)
Alkaline Phosphatase: 131 U/L — ABNORMAL HIGH (ref 38–126)
Bilirubin, Direct: 3.2 mg/dL — ABNORMAL HIGH (ref 0.0–0.2)
Indirect Bilirubin: 2 mg/dL — ABNORMAL HIGH (ref 0.3–0.9)
Total Bilirubin: 5.2 mg/dL — ABNORMAL HIGH (ref 0.3–1.2)
Total Protein: 5.9 g/dL — ABNORMAL LOW (ref 6.5–8.1)

## 2019-06-23 LAB — URINALYSIS, ROUTINE W REFLEX MICROSCOPIC
Bilirubin Urine: NEGATIVE
Glucose, UA: >=500 mg/dL — AB
Ketones, ur: NEGATIVE mg/dL
Leukocytes,Ua: NEGATIVE
Nitrite: NEGATIVE
Protein, ur: 100 mg/dL — AB
Specific Gravity, Urine: 1.041 — ABNORMAL HIGH (ref 1.005–1.030)
pH: 5 (ref 5.0–8.0)

## 2019-06-23 LAB — PROTIME-INR
INR: 2.4 — ABNORMAL HIGH (ref 0.8–1.2)
Prothrombin Time: 26.4 seconds — ABNORMAL HIGH (ref 11.4–15.2)

## 2019-06-23 LAB — BASIC METABOLIC PANEL
Anion gap: 12 (ref 5–15)
BUN: 28 mg/dL — ABNORMAL HIGH (ref 8–23)
CO2: 19 mmol/L — ABNORMAL LOW (ref 22–32)
Calcium: 8.4 mg/dL — ABNORMAL LOW (ref 8.9–10.3)
Chloride: 104 mmol/L (ref 98–111)
Creatinine, Ser: 1.55 mg/dL — ABNORMAL HIGH (ref 0.61–1.24)
GFR calc Af Amer: 52 mL/min — ABNORMAL LOW (ref >60)
GFR calc non Af Amer: 45 mL/min — ABNORMAL LOW (ref >60)
Glucose, Bld: 95 mg/dL (ref 70–99)
Potassium: 3.7 mmol/L (ref 3.5–5.1)
Sodium: 135 mmol/L (ref 135–145)

## 2019-06-23 MED ORDER — SODIUM CHLORIDE 0.9 % IV SOLN: INTRAVENOUS | Status: AC

## 2019-06-23 NOTE — Evaluation (Signed)
Physical Therapy Evaluation Patient Details Name: Taylor [name] MRN: [id number] DOB: 04/01/1951 Today's Date: 06/23/2019   History of Present Illness  Patient is a 69 y/o male who presents with abdominal pain, N/V. CHest CT-cholelithiasis with acute Cholecystitis and elevated LFTs. Admitted with sepsis secondary to acute choleycystitis. Plan for drain placement after clearance from Cardiology. PMH includes HTN, HLD, mechanical valve replacement,ventricular tachycardia, CHF.  Clinical Impression  Patient presents with generalized weakness, low BP, impaired balance and impaired mobility s/p above. Pt reports being independent PTA and lives alone. Takes the bus for transportation. Mobility assessment limited due to low BP. Sitting BP 54/35 (40), returned to supine and BP 89/63 (72), asymptomatic. Pt transferred to/from [facility] with Min A for balance/lines. Awaiting cardiac clearance for possible drain placement. Will follow acutely to maximize independence and mobility prior to return home.     Follow Up Recommendations Home health PT;Supervision - Intermittent    Equipment Recommendations  None recommended by PT    Recommendations for Other Services       Precautions / Restrictions Precautions Precautions: Fall Precaution Comments: low BP Restrictions Weight Bearing Restrictions: No      Mobility  Bed Mobility Overal bed mobility: Needs Assistance Bed Mobility: Rolling;Sidelying to Sit Rolling: Min guard Sidelying to sit: Min guard;HOB elevated       General bed mobility comments: Cues for technique and to reach for rail; increased time/effort.  Transfers Overall transfer level: Needs assistance Equipment used: None Transfers: Stand Pivot Transfers   Stand pivot transfers: Min assist       General transfer comment: SPT bed to/.from [facility] Min A for balance/lines.  Ambulation/Gait             General Gait Details: Deferred due to low BP  Stairs             Wheelchair Mobility    Modified Rankin (Stroke Patients Only)       Balance Overall balance assessment: Needs assistance Sitting-balance support: Feet supported;No upper extremity supported Sitting balance-Leahy Scale: Fair Sitting balance - Comments: Able to perform pericare without assist.   Standing balance support: During functional activity Standing balance-Leahy Scale: Fair Standing balance comment: Requires UE support for dynamic standing.                             Pertinent Vitals/Pain Pain Assessment: No/denies pain    Home Living Family/patient expects to be discharged to:: Private residence Living Arrangements: Alone   Type of Home: House Home Access: Level entry     Home Layout: Laundry or work area in basement;Two level Home Equipment: Walker - 2 wheels;Cane - single point;Bedside commode Additional Comments: in storage from mother    Prior Function Level of Independence: Independent         Comments: USes bus for transportation.     Hand Dominance        Extremity/Trunk Assessment   Upper Extremity Assessment Upper Extremity Assessment: Defer to OT evaluation    Lower Extremity Assessment Lower Extremity Assessment: Generalized weakness    Cervical / Trunk Assessment Cervical / Trunk Assessment: Normal  Communication   Communication: No difficulties  Cognition Arousal/Alertness: Awake/alert Behavior During Therapy: WFL for tasks assessed/performed Overall Cognitive Status: Within Functional Limits for tasks assessed  General Comments General comments (skin integrity, edema, etc.): BP sitting EOB 54/35 (40), supine post transfer 89/63 (72). asymptomatic.    Exercises     Assessment/Plan    PT Assessment Patient needs continued PT services  PT Problem List Decreased strength;Decreased mobility;Decreased balance;Cardiopulmonary status limiting activity        PT Treatment Interventions Therapeutic activities;Gait training;Therapeutic exercise;Patient/family education;Balance training;Functional mobility training;DME instruction    PT Goals (Current goals can be found in the Care Plan section)  Acute Rehab PT Goals Patient Stated Goal: to eat something PT Goal Formulation: With patient Time For Goal Achievement: 07/07/19 Potential to Achieve Goals: Good    Frequency Min 3X/week   Barriers to discharge Decreased caregiver support lives alone    Co-evaluation               AM-PAC PT "6 Clicks" Mobility  Outcome Measure Help needed turning from your back to your side while in a flat bed without using bedrails?: A Little Help needed moving from lying on your back to sitting on the side of a flat bed without using bedrails?: A Little Help needed moving to and from a bed to a chair (including a wheelchair)?: A Little Help needed standing up from a chair using your arms (e.g., wheelchair or bedside chair)?: A Little Help needed to walk in hospital room?: A Little Help needed climbing 3-5 steps with a railing? : A Little 6 Click Score: 18    End of Session   Activity Tolerance: Treatment limited secondary to medical complications (Comment);Patient tolerated treatment well(low bp) Patient left: in bed;with call bell/phone within reach;with bed alarm set;Other (comment)(Cardiology PA in room) Nurse Communication: Mobility status;Other (comment)(BP) PT Visit Diagnosis: Muscle weakness (generalized) (M62.81)    Time: [id number] PT Time Calculation (min) (ACUTE ONLY): 26 min   Charges:   PT Evaluation $PT Eval Moderate Complexity: 1 Mod PT Treatments $Therapeutic Activity: 8-22 mins        Marisa Severin, PT, DPT Acute Rehabilitation Services Pager [phone] Office [phone]      [name] 06/23/2019, 2:57 PM

## 2019-06-23 NOTE — Progress Notes (Addendum)
ANTICOAGULATION CONSULT NOTE - Initial Consult  Pharmacy Consult for heparin Indication: mech AVR   Patient Measurements: Height: 5\' 9"  (175.3 cm) Weight: 166 lb 3.6 oz (75.4 kg) IBW/kg (Calculated) : 70.7 Heparin Dosing Weight: 75.4 kg  Vital Signs: Temp: 98 F (36.7 C) (01/25 0751) Temp Source: Oral (01/25 0751) BP: 80/58 (01/25 0751) Pulse Rate: 69 (01/25 0751)  Labs: Recent Labs    06/22/19 1601 06/22/19 2216 06/23/19 0246  HGB 13.4 12.3*  --   HCT 38.3* 34.5*  --   PLT 290 232  --   APTT 39*  --   --   LABPROT 26.2*  --  26.4*  INR 2.4*  --  2.4*  CREATININE 1.70* 1.55*  --     Estimated Creatinine Clearance: 45 mL/min (A) (by C-G formula based on SCr of 1.55 mg/dL (H)).   Assessment: 69 yo M with hx mechanical AVR on warfarin PTA now held for pending percutaneous chole drain. INR is 2.4, will hold heparin until below 2.  Goal of Therapy:  Heparin level 0.3-0.7 units/ml Monitor platelets by anticoagulation protocol: Yes   Plan:  When INR < 2, start heparin 1000 units,/hr If INR reversal is needed, would give vitamin K IV 0.5mg  once and/or FFP  Monitor daily INR, HL, CBC, plt Monitor for signs/symptoms of bleeding  F/u restart warfarin    Benetta Spar, PharmD, BCPS, [facility] Clinical Pharmacist  Please check AMION for all [place] phone numbers After 10:00 PM, call [place]

## 2019-06-23 NOTE — Progress Notes (Signed)
I concerned about BP 77/46 - 85/ 59mmHg, (MAP 58- 64), even though Pt's been  asymptomatic.  On- call provider, Jeannette Corpus, NP made aware. No order received. Continue to monitor.  Taylor [name] [id number]

## 2019-06-23 NOTE — Progress Notes (Signed)
Subjective/Chief Complaint: RUQ pain has improved some   Objective: Vital signs in last 24 hours: Temp:  [97.8 F (36.6 C)-98.8 F (37.1 C)] 98 F (36.7 C) (01/25 0751) Pulse Rate:  [65-92] 69 (01/25 0751) Resp:  [13-30] 18 (01/25 0751) BP: (73-130)/(41-83) 80/58 (01/25 0751) SpO2:  [92 %-98 %] 95 % (01/25 0751) Weight:  [75.4 kg] 75.4 kg (01/25 0447) Last BM Date: 06/23/19  Intake/Output from previous day: 01/24 0701 - 01/25 0700 In: 1586.7 [P.O.:500; IV Piggyback:1086.7] Out: 3 [Urine:2; Stool:1] Intake/Output this shift: No intake/output data recorded.  General appearance: cooperative Resp: clear to auscultation bilaterally Cardio: regular rate and rhythm GI: soft, tender central and mild RUQ  Lab Results:  Recent Labs    06/22/19 1601 06/22/19 2216  WBC 8.2 7.3  HGB 13.4 12.3*  HCT 38.3* 34.5*  PLT 290 232   BMET Recent Labs    06/22/19 1601 06/22/19 2216  NA 133* 135  K 3.8 3.7  CL 101 104  CO2 19* 19*  GLUCOSE 135* 95  BUN 26* 28*  CREATININE 1.70* 1.55*  CALCIUM 8.9 8.4*   PT/INR Recent Labs    06/22/19 1601 06/23/19 0246  LABPROT 26.2* 26.4*  INR 2.4* 2.4*   ABG No results for input(s): PHART, HCO3 in the last 72 hours.  Invalid input(s): PCO2, PO2  Studies/Results: CT ABDOMEN PELVIS WO CONTRAST  Result Date: 06/22/2019 CLINICAL DATA:  Abdominal pain. EXAM: CT CHEST, ABDOMEN AND PELVIS WITHOUT CONTRAST TECHNIQUE: Multidetector CT imaging of the chest, abdomen and pelvis was performed following the standard protocol without IV contrast. COMPARISON:  None. FINDINGS: CT CHEST FINDINGS Cardiovascular: There is a dual lead AICD. There is marked severity calcification of the thoracic aorta without evidence of aneurysmal dilatation. An artificial aortic valve is noted. Normal heart size. No pericardial effusion. Marked severity coronary artery calcification is seen. Mediastinum/Nodes: Lungs/Pleura: Mild areas of atelectasis and/or infiltrate  are seen within the left upper lobe and posterior aspect of the left lung base. There is no evidence of a pleural effusion or pneumothorax. Musculoskeletal: Multiple sternal wires are seen. Multilevel degenerative changes are noted throughout the thoracic spine. CT ABDOMEN PELVIS FINDINGS Hepatobiliary: No focal liver abnormality is seen. Numerous tiny gallstones are seen within the lumen of a partially contracted gallbladder. A very mild amount of pericholecystic inflammation is noted. Pancreas: Unremarkable. No pancreatic ductal dilatation or surrounding inflammatory changes. Spleen: Normal in size without focal abnormality. Adrenals/Urinary Tract: Adrenal glands are unremarkable. Kidneys are normal in size, without renal calculi or hydronephrosis. Small areas of parenchymal calcification and adjacent focal scarring are seen within the lower pole the left kidney. A 2.5 cm x 1.6 cm adjacent area of low attenuation is seen within the posterior aspect of the lower pole on the left (axial CT image 37, CT series number 8). Bladder is unremarkable. Stomach/Bowel: Stomach is within normal limits. The appendix is not identified. No evidence of bowel wall thickening, distention, or inflammatory changes. Vascular/Lymphatic: There is marked severity aortic atherosclerosis. 4.6 cm x 4.5 cm aneurysmal dilatation of the infrarenal abdominal aorta is seen. No enlarged abdominal or pelvic lymph nodes. Reproductive: The prostate gland is mildly enlarged. Other: No abdominal wall hernia or abnormality. No abdominopelvic ascites. Musculoskeletal: Multilevel degenerative changes are seen throughout the lumbar spine. IMPRESSION: 1. Cholelithiasis with additional findings suggestive of acute cholecystitis. 2. 4.6 cm x 4.5 cm infrarenal abdominal aortic aneurysm. 3. Evidence of prior median sternotomy. 4. Artificial aortic valve. 5. Focal scarring and calcification within  the lower pole of the left kidney with an adjacent area of low  attenuation which may represent a small renal cyst. Correlation with renal ultrasound is recommended. Electronically Signed   By: Virgina Norfolk M.D.   On: 06/22/2019 17:59   CT CHEST WO CONTRAST  Result Date: 06/22/2019 CLINICAL DATA:  Abdominal pain. EXAM: CT CHEST, ABDOMEN AND PELVIS WITHOUT CONTRAST TECHNIQUE: Multidetector CT imaging of the chest, abdomen and pelvis was performed following the standard protocol without IV contrast. COMPARISON:  None. FINDINGS: CT CHEST FINDINGS Cardiovascular: There is a dual lead AICD. There is marked severity calcification of the thoracic aorta without evidence of aneurysmal dilatation. An artificial aortic valve is noted. Normal heart size. No pericardial effusion. Marked severity coronary artery calcification is seen. Mediastinum/Nodes: Lungs/Pleura: Mild areas of atelectasis and/or infiltrate are seen within the left upper lobe and posterior aspect of the left lung base. There is no evidence of a pleural effusion or pneumothorax. Musculoskeletal: Multiple sternal wires are seen. Multilevel degenerative changes are noted throughout the thoracic spine. CT ABDOMEN PELVIS FINDINGS Hepatobiliary: No focal liver abnormality is seen. Numerous tiny gallstones are seen within the lumen of a partially contracted gallbladder. A very mild amount of pericholecystic inflammation is noted. Pancreas: Unremarkable. No pancreatic ductal dilatation or surrounding inflammatory changes. Spleen: Normal in size without focal abnormality. Adrenals/Urinary Tract: Adrenal glands are unremarkable. Kidneys are normal in size, without renal calculi or hydronephrosis. Small areas of parenchymal calcification and adjacent focal scarring are seen within the lower pole the left kidney. A 2.5 cm x 1.6 cm adjacent area of low attenuation is seen within the posterior aspect of the lower pole on the left (axial CT image 37, CT series number 8). Bladder is unremarkable. Stomach/Bowel: Stomach is within  normal limits. The appendix is not identified. No evidence of bowel wall thickening, distention, or inflammatory changes. Vascular/Lymphatic: There is marked severity aortic atherosclerosis. 4.6 cm x 4.5 cm aneurysmal dilatation of the infrarenal abdominal aorta is seen. No enlarged abdominal or pelvic lymph nodes. Reproductive: The prostate gland is mildly enlarged. Other: No abdominal wall hernia or abnormality. No abdominopelvic ascites. Musculoskeletal: Multilevel degenerative changes are seen throughout the lumbar spine. IMPRESSION: 1. Cholelithiasis with additional findings suggestive of acute cholecystitis. 2. 4.6 cm x 4.5 cm infrarenal abdominal aortic aneurysm. 3. Evidence of prior median sternotomy. 4. Artificial aortic valve. 5. Focal scarring and calcification within the lower pole of the left kidney with an adjacent area of low attenuation which may represent a small renal cyst. Correlation with renal ultrasound is recommended. Electronically Signed   By: Virgina Norfolk M.D.   On: 06/22/2019 17:59   US Abdomen Limited RUQ  Result Date: 06/22/2019 CLINICAL DATA:  Biliary disease. EXAM: ULTRASOUND ABDOMEN LIMITED RIGHT UPPER QUADRANT COMPARISON:  CT abdomen/pelvis 06/22/2019 FINDINGS: Gallbladder: Cholecystolithiasis. Visualized gallstones measure up to 6 mm. Thick-walled gallbladder measuring 11 mm. Pericholecystic fluid. A sonographic Murphy sign was elicited by the scanning technologist. Common bile duct: Diameter: 4 mm proximally, 5-6 mm distally. Liver: No focal lesion identified. Increased hepatic parenchymal echogenicity. Interrogated portal vein is patent on color Doppler imaging with normal direction of blood flow towards the liver. IMPRESSION: Cholecystolithiasis with gallbladder wall thickening and pericholecystic fluid. A sonographic Murphy sign was elicited by the scanning technologist. This constellation of findings is highly suggestive of acute cholecystitis. Increased hepatic  parenchymal echogenicity. While this is a nonspecific finding, increased density of the liver was demonstrated on CT performed earlier the same day. Query changes related  to amiodarone therapy in this patient with a history of chronic heart failure. Electronically Signed   By: Kellie Simmering DO   On: 06/22/2019 21:40    Anti-infectives: Anti-infectives (From admission, onward)   Start     Dose/Rate Route Frequency Ordered Stop   06/22/19 2200  piperacillin-tazobactam (ZOSYN) IVPB 3.375 g     3.375 g 12.5 mL/hr over 240 Minutes Intravenous Every 8 hours 06/22/19 2014     06/22/19 1830  piperacillin-tazobactam (ZOSYN) IVPB 3.375 g     3.375 g 100 mL/hr over 30 Minutes Intravenous  Once 06/22/19 1815 06/22/19 1916      Assessment/Plan: Cholecystitis and elevated LFTs - NPO and Zosyn IV. CBD not significantly dilated. In light of his CHF, I think the best choice now is a perc chole drain by IR. Cardiology is going to see for perioperative risk stratification. We will see what their thoughts are and plan accordingly.  LOS: 1 day    Taylor [name] 06/23/2019

## 2019-06-23 NOTE — Consult Note (Addendum)
Advanced Heart Failure Team Consult Note   Primary Physician: Michell Heinrich, DO PCP-Cardiologist:  [facility]: Dr. Haroldine Laws  EP: Dr. Caryl Comes   Reason for Consultation:  preoperative risk assessment for potential cholecystomy for acute cholecystitis  HPI:    Alf Smrekar is seen today for evaluation of preoperative risk assessment for potential cholecystomy for acute cholecystitis,  at the request of [place] Silos, General Surgery.   Rayn Amacker is a 69 y.o. male with mechnical AVR on  chronic coumadin, h/o VF arrest s/p ICD , CAD s/p CABG, chronic systolic HF (EF 0000000), AAA and h/o essential thrombosis.   Has h/o congenital AoV abnormality with AI. In 1994 had MI and underwent PCI (unclear artery) and mechanical AVR at [facility]   Has h/o polysubstance abuse with cocaine, ETOH and tobacco abuse. No longer doing cocaine or smoking. Now drinks about 2 beers per day.  Did well from heart perspective until 2017, when he had VF arrest on 05/18/16. Found to have 3v CAD and MV disease and underwent CABG x 3 with mechanical AVR. Post-operatively had AF (treated with amio) and CHB requiring implantation of a BosSci CRT-D device. This is followed by Dr. Caryl Comes. Coumadin Clinic at [facility] street follows INR. INR goal 2.0-3.0. PCP in [place] follows lipids.   Echo on 12/18 showed LVEF of 25-30% stable AVR  Had CT on 12/18 for his back. Showed 3.7cm AAA. Had f/u US 05/2018 that showed mild increase in size of the known distal fusiform abdominal aortic aneurysm, measuring 4.2 cm in maximum diameter. He was referred to [facility] and he is now followed by Dr. Oneida Alar.    Most recent echo 02/2019 showed EF 20-25%. RV normal. At last [facility] visit in 02/2019, he was stable from CHF symptom standpoint, but has been intolerant to bid Entresto dosing due to dizziness. Only takes once daily. Wilder Glade was added to his HF regimen last visit.   He presented to [facility] on 06/22/19 w/ complaints of abdominal pain, n/v,  along w/ fatigue, jaundice, light colored stools and dark urine. Initial labs showed AKI w/ SCr elevated at 1.7 (baseline 1.1-1.2) and elevated LFTs, T bili 4.9, AST 295, ALT 257. Lipase normal. RUQ [place] as well as CT of abdomen and pelvis showed evidence cholelithiasis w/ acute cholecystitis. On CT, previously known infrarenal AAA was visualized, non ruptured, measuring at 4.6 x 4.5 cm.  He was admitted by IM, started on IVFs and placed on abx therapy w/ zosyn. General surgery consulted and they are leaning towards perc chole drain by IR, however AHF team asked to weigh in regarding perioperative risk stratification, in the event that he may require surgery. His coumadin is on hold. INR is 2.4. He is afebrile and WBC ct has remained WNL. Blood cultures NGTD. He remains on IVFs, NaCl at 75 mL/hr and SCr improving, down from 1.70>>1.55. Wt stable overnight at 166 lb.  LFTs also improving, AST 268, ALT 242 but Total bil increased from 4.9>>5.2.  His home HF meds have been on hold due to hypotension. SBP remains low in the 80s despite IVFs but asymptomatic. Denies fever and chills.     Prior to development of GI symptoms, he reported doing well from cardiac standpoint. No exertional symptoms w/ mild- moderate activity. NYHA class II. Able to perform > 4 METs of physical activity (ambulate a flight of stairs) w/o symptoms of angina. Denies ICD shocks, syncope/ near syncope. No weight gain, LEE, orthopnea/PND.    Echo 02/2019  1. Left ventricular ejection fraction, by visual estimation, is 20 to 25%. The left ventricle has severely decreased function. Severely increased left ventricular size. There is no left ventricular hypertrophy.  2. Left ventricular diastolic Doppler parameters are consistent with restrictive filling pattern of LV diastolic filling.  3. Global diffuse hypokinesis. Chambers measure slightly more dilated than prior.  4. Global right ventricle has normal systolic function.The right ventricular  size is normal. No increase in right ventricular wall thickness.  5. Left atrial size was moderately dilated.  6. Right atrial size was moderately dilated.  7. The mitral valve is normal in structure. Trace mitral valve regurgitation.  8. The tricuspid valve is normal in structure. Tricuspid valve regurgitation is mild.  9. Aortic valve regurgitation is mild by color flow Doppler. 10. Bileaflet mechanical prosthesis in the aortic valve position. 11. The pulmonic valve was grossly normal. Pulmonic valve regurgitation is mild by color flow Doppler. 12. The aortic root was not well visualized. 13. Normal pulmonary artery systolic pressure. 14. The tricuspid regurgitant velocity is 2.40 m/s, and with an assumed right atrial pressure of 3 mmHg, the estimated right ventricular systolic pressure is normal at 26.1 mmHg. 15. The inferior vena cava is normal in size with greater than 50% respiratory variability, suggesting right atrial pressure of 3 mmHg.    Review of Systems: [y] = yes, [ ]  = no   . General: Weight gain [ ] ; Weight loss [ ] ; Anorexia [ ] ; Fatigue [ ] ; Fever [ ] ; Chills [ ] ; Weakness [ ]   . Cardiac: Chest pain/pressure [ ] ; Resting SOB [ ] ; Exertional SOB [ ] ; Orthopnea [ ] ; Pedal Edema [ ] ; Palpitations [ ] ; Syncope [ ] ; Presyncope [ ] ; Paroxysmal nocturnal dyspnea[ ]   . Pulmonary: Cough [ ] ; Wheezing[ ] ; Hemoptysis[ ] ; Sputum [ ] ; Snoring [ ]   . GI: Vomiting[ ] ; Dysphagia[ ] ; Melena[ ] ; Hematochezia [ ] ; Heartburn[ ] ; Abdominal pain [ ] ; Constipation [ ] ; Diarrhea [ ] ; BRBPR [ ]   . GU: Hematuria[ ] ; Dysuria [ ] ; Nocturia[ ]   . Vascular: Pain in legs with walking [ ] ; Pain in feet with lying flat [ ] ; Non-healing sores [ ] ; Stroke [ ] ; TIA [ ] ; Slurred speech [ ] ;  . Neuro: Headaches[ ] ; Vertigo[ ] ; Seizures[ ] ; Paresthesias[ ] ;Blurred vision [ ] ; Diplopia [ ] ; Vision changes [ ]   . Ortho/Skin: Arthritis [ ] ; Joint pain [ ] ; Muscle pain [ ] ; Joint swelling [ ] ; Back Pain [ ] ; Rash [ ]    . Psych: Depression[ ] ; Anxiety[ ]   . Heme: Bleeding problems [ ] ; Clotting disorders [ ] ; Anemia [ ]   . Endocrine: Diabetes [ ] ; Thyroid dysfunction[ ]   Home Medications Prior to Admission medications   Medication Sig Start Date End Date Taking? Authorizing Provider  amiodarone (PACERONE) 200 MG tablet TAKE 1 TABLET(200 MG) BY MOUTH DAILY Patient taking differently: Take 200 mg by mouth daily.  12/26/18  Yes Shaday Rayborn, Shaune Pascal, MD  aspirin 81 MG tablet Take 1 tablet by mouth.   Yes [provider]  atorvastatin (LIPITOR) 40 MG tablet Take 1 tablet (40 mg total) by mouth at bedtime. Patient taking differently: Take 40 mg by mouth 2 (two) times daily.  03/21/19 06/22/19 Yes Simmons, Brittainy M, PA-C  carvedilol (COREG) 6.25 MG tablet Take 1 tablet (6.25 mg total) by mouth 2 (two) times daily with a meal. 02/27/19  Yes Jemario Poitras, Shaune Pascal, MD  dapagliflozin propanediol (FARXIGA) 10 MG TABS tablet Take 10  mg by mouth daily.   Yes [provider]  diphenhydrAMINE (BENADRYL) 25 MG tablet Take 25 mg by mouth every 6 (six) hours as needed for sleep.    Yes [provider]  hydroxyurea (HYDREA) 500 MG capsule TAKE 1 CAPSULE BY MOUTH ALTERNATING WITH 2 CAPSULES DAILY(500 MG ONE DAY AND 1000 MG NEXT DAY). Patient taking differently: Take 500 mg by mouth See admin instructions. TAKE 1 CAPSULE BY MOUTH ALTERNATING WITH 2 CAPSULES DAILY(500 MG ONE DAY AND 1000 MG NEXT DAY). 06/11/19  Yes Haille Pardi, Shaune Pascal, MD  levothyroxine (SYNTHROID, LEVOTHROID) 25 MCG tablet Take 2 tablets (50 mcg total) by mouth daily. Additional refills should come from PCP 06/05/18  Yes Arohi Salvatierra, Shaune Pascal, MD  sacubitril-valsartan (ENTRESTO) 24-26 MG Take 1 tablet by mouth daily. 05/19/19  Yes Natosha Bou, Shaune Pascal, MD  spironolactone (ALDACTONE) 25 MG tablet TAKE 1/2 TABLET(12.5 MG) BY MOUTH DAILY Patient taking differently: Take 12.5 mg by mouth daily.  10/14/18  Yes Lanier Millon, Shaune Pascal, MD  warfarin  (COUMADIN) 5 MG tablet Take as directed by coumadin clinic Patient taking differently: Take 2.5 mg by mouth every evening. Take as directed by coumadin clinic 02/17/19  Yes Avary Eichenberger, Shaune Pascal, MD    Past Medical History: Past Medical History:  Diagnosis Date  . Atherosclerotic heart disease of native coronary artery without angina pectoris   . Chronic anticoagulation 02/18/2018  . Chronic systolic heart failure ([place]) 02/02/2017  . Essential thrombocythemia ([place]) 02/15/2017  . Hyperlipidemia   . Hypertension   . Ischemic cardiomyopathy   . Leg pain, central, left 04/17/2017   Hx sciatica - pain similar; no red flag signs on hx or exam   . Presence of other heart-valve replacement   . Ventricular tachycardia [facility])     Past Surgical History: Past Surgical History:  Procedure Laterality Date  . BIV ICD INSERTION CRT-D    . CORONARY ARTERY BYPASS GRAFT  2017  . MECHANICAL AORTIC VALVE REPLACEMENT      Family History: Family History  Problem Relation Age of Onset  . Hypertension Father     Social History: Social History   Socioeconomic History  . Marital status: Married    Spouse name: Not on file  . Number of children: Not on file  . Years of education: Not on file  . Highest education level: Not on file  Occupational History  . Not on file  Tobacco Use  . Smoking status: Former Smoker    Packs/day: 1.00    Years: 30.00    Pack years: 30.00    Types: Cigarettes    Start date: 05/26/1979    Quit date: 05/18/2016    Years since quitting: 3.0  . Smokeless tobacco: Never Used  Substance and Sexual Activity  . Alcohol use: Yes    Alcohol/week: 14.0 standard drinks    Types: 14 Cans of beer per week    Comment: 2 cans a day..  . Drug use: No  . Sexual activity: Not on file  Other Topics Concern  . Not on file  Social History Narrative  . Not on file   Social Determinants of Health   Financial Resource Strain:   . Difficulty of Paying Living Expenses: Not on file   Food Insecurity:   . Worried About Charity fundraiser in the Last Year: Not on file  . Ran Out of Food in the Last Year: Not on file  Transportation Needs:   . Lack of Transportation (Medical): Not  on file  . Lack of Transportation (Non-Medical): Not on file  Physical Activity:   . Days of Exercise per Week: Not on file  . Minutes of Exercise per Session: Not on file  Stress:   . Feeling of Stress : Not on file  Social Connections:   . Frequency of Communication with Friends and Family: Not on file  . Frequency of Social Gatherings with Friends and Family: Not on file  . Attends Religious Services: Not on file  . Active Member of Clubs or Organizations: Not on file  . Attends Archivist Meetings: Not on file  . Marital Status: Not on file    Allergies:  Allergies  Allergen Reactions  . Erythromycin Rash  . Atorvastatin Calcium Other (See Comments)  . Rosuvastatin Calcium     Unknown  . Simvastatin     Unknown  . Zithromax [Azithromycin] Rash    Objective:    Vital Signs:   Temp:  [97.8 F (36.6 C)-98.8 F (37.1 C)] 98 F (36.7 C) (01/25 0751) Pulse Rate:  [65-92] 72 (01/25 1255) Resp:  [13-30] 18 (01/25 1255) BP: (73-130)/(41-83) 85/62 (01/25 1255) SpO2:  [92 %-98 %] 96 % (01/25 1255) Weight:  [75.4 kg] 75.4 kg (01/25 0447) Last BM Date: 06/23/19  Weight change: Filed Weights   06/22/19 2200 06/23/19 0447  Weight: 75.4 kg 75.4 kg    Intake/Output:   Intake/Output Summary (Last 24 hours) at 06/23/2019 1518 Last data filed at 06/23/2019 0439 Gross per 24 hour  Intake 1586.67 ml  Output 3 ml  Net 1583.67 ml      Physical Exam    General:  Well appearing WM. No resp difficulty HEENT: normal Neck: supple. JVP . Carotids 2+ bilat; no bruits. No lymphadenopathy or thyromegaly appreciated. Cor: PMI nondisplaced. Regular rate & rhythm. Crisp mechanical valve sounds present  Lungs: clear Abdomen: mild RUQ tenderness. No hepatosplenomegaly. No  bruits or masses. Good bowel sounds. Extremities: no cyanosis, clubbing, rash, edema Neuro: alert & orientedx3, cranial nerves grossly intact. moves all 4 extremities w/o difficulty. Affect pleasant   Telemetry   Paced rhythm, 80s   EKG    V paced 75 bpm   Labs   Basic Metabolic Panel: Recent Labs  Lab 06/22/19 1601 06/22/19 2038 06/22/19 2216  NA 133*  --  135  K 3.8  --  3.7  CL 101  --  104  CO2 19*  --  19*  GLUCOSE 135*  --  95  BUN 26*  --  28*  CREATININE 1.70*  --  1.55*  CALCIUM 8.9  --  8.4*  MG  --  1.6*  --   PHOS  --  4.9*  --     Liver Function Tests: Recent Labs  Lab 06/22/19 1601 06/22/19 2216  AST 295* 268*  ALT 257* 242*  ALKPHOS 146* 131*  BILITOT 4.9* 5.2*  PROT 6.5 5.9*  ALBUMIN 3.2* 2.8*   Recent Labs  Lab 06/22/19 1601  LIPASE 14   No results for input(s): AMMONIA in the last 168 hours.  CBC: Recent Labs  Lab 06/22/19 1601 06/22/19 2216  WBC 8.2 7.3  HGB 13.4 12.3*  HCT 38.3* 34.5*  MCV 123.9* 123.2*  PLT 290 232    Cardiac Enzymes: No results for input(s): CKTOTAL, CKMB, CKMBINDEX, TROPONINI in the last 168 hours.  BNP: BNP (last 3 results) No results for input(s): BNP in the last 8760 hours.  ProBNP (last 3 results) No results for  input(s): PROBNP in the last 8760 hours.   CBG: No results for input(s): GLUCAP in the last 168 hours.  Coagulation Studies: Recent Labs    06/22/19 1601 06/23/19 0246  LABPROT 26.2* 26.4*  INR 2.4* 2.4*     Imaging   CT ABDOMEN PELVIS WO CONTRAST  Result Date: 06/22/2019 CLINICAL DATA:  Abdominal pain. EXAM: CT CHEST, ABDOMEN AND PELVIS WITHOUT CONTRAST TECHNIQUE: Multidetector CT imaging of the chest, abdomen and pelvis was performed following the standard protocol without IV contrast. COMPARISON:  None. FINDINGS: CT CHEST FINDINGS Cardiovascular: There is a dual lead AICD. There is marked severity calcification of the thoracic aorta without evidence of aneurysmal  dilatation. An artificial aortic valve is noted. Normal heart size. No pericardial effusion. Marked severity coronary artery calcification is seen. Mediastinum/Nodes: Lungs/Pleura: Mild areas of atelectasis and/or infiltrate are seen within the left upper lobe and posterior aspect of the left lung base. There is no evidence of a pleural effusion or pneumothorax. Musculoskeletal: Multiple sternal wires are seen. Multilevel degenerative changes are noted throughout the thoracic spine. CT ABDOMEN PELVIS FINDINGS Hepatobiliary: No focal liver abnormality is seen. Numerous tiny gallstones are seen within the lumen of a partially contracted gallbladder. A very mild amount of pericholecystic inflammation is noted. Pancreas: Unremarkable. No pancreatic ductal dilatation or surrounding inflammatory changes. Spleen: Normal in size without focal abnormality. Adrenals/Urinary Tract: Adrenal glands are unremarkable. Kidneys are normal in size, without renal calculi or hydronephrosis. Small areas of parenchymal calcification and adjacent focal scarring are seen within the lower pole the left kidney. A 2.5 cm x 1.6 cm adjacent area of low attenuation is seen within the posterior aspect of the lower pole on the left (axial CT image 37, CT series number 8). Bladder is unremarkable. Stomach/Bowel: Stomach is within normal limits. The appendix is not identified. No evidence of bowel wall thickening, distention, or inflammatory changes. Vascular/Lymphatic: There is marked severity aortic atherosclerosis. 4.6 cm x 4.5 cm aneurysmal dilatation of the infrarenal abdominal aorta is seen. No enlarged abdominal or pelvic lymph nodes. Reproductive: The prostate gland is mildly enlarged. Other: No abdominal wall hernia or abnormality. No abdominopelvic ascites. Musculoskeletal: Multilevel degenerative changes are seen throughout the lumbar spine. IMPRESSION: 1. Cholelithiasis with additional findings suggestive of acute cholecystitis. 2. 4.6  cm x 4.5 cm infrarenal abdominal aortic aneurysm. 3. Evidence of prior median sternotomy. 4. Artificial aortic valve. 5. Focal scarring and calcification within the lower pole of the left kidney with an adjacent area of low attenuation which may represent a small renal cyst. Correlation with renal ultrasound is recommended. Electronically Signed   By: Virgina Norfolk M.D.   On: 06/22/2019 17:59   CT CHEST WO CONTRAST  Result Date: 06/22/2019 CLINICAL DATA:  Abdominal pain. EXAM: CT CHEST, ABDOMEN AND PELVIS WITHOUT CONTRAST TECHNIQUE: Multidetector CT imaging of the chest, abdomen and pelvis was performed following the standard protocol without IV contrast. COMPARISON:  None. FINDINGS: CT CHEST FINDINGS Cardiovascular: There is a dual lead AICD. There is marked severity calcification of the thoracic aorta without evidence of aneurysmal dilatation. An artificial aortic valve is noted. Normal heart size. No pericardial effusion. Marked severity coronary artery calcification is seen. Mediastinum/Nodes: Lungs/Pleura: Mild areas of atelectasis and/or infiltrate are seen within the left upper lobe and posterior aspect of the left lung base. There is no evidence of a pleural effusion or pneumothorax. Musculoskeletal: Multiple sternal wires are seen. Multilevel degenerative changes are noted throughout the thoracic spine. CT ABDOMEN PELVIS FINDINGS Hepatobiliary: No  focal liver abnormality is seen. Numerous tiny gallstones are seen within the lumen of a partially contracted gallbladder. A very mild amount of pericholecystic inflammation is noted. Pancreas: Unremarkable. No pancreatic ductal dilatation or surrounding inflammatory changes. Spleen: Normal in size without focal abnormality. Adrenals/Urinary Tract: Adrenal glands are unremarkable. Kidneys are normal in size, without renal calculi or hydronephrosis. Small areas of parenchymal calcification and adjacent focal scarring are seen within the lower pole the left  kidney. A 2.5 cm x 1.6 cm adjacent area of low attenuation is seen within the posterior aspect of the lower pole on the left (axial CT image 37, CT series number 8). Bladder is unremarkable. Stomach/Bowel: Stomach is within normal limits. The appendix is not identified. No evidence of bowel wall thickening, distention, or inflammatory changes. Vascular/Lymphatic: There is marked severity aortic atherosclerosis. 4.6 cm x 4.5 cm aneurysmal dilatation of the infrarenal abdominal aorta is seen. No enlarged abdominal or pelvic lymph nodes. Reproductive: The prostate gland is mildly enlarged. Other: No abdominal wall hernia or abnormality. No abdominopelvic ascites. Musculoskeletal: Multilevel degenerative changes are seen throughout the lumbar spine. IMPRESSION: 1. Cholelithiasis with additional findings suggestive of acute cholecystitis. 2. 4.6 cm x 4.5 cm infrarenal abdominal aortic aneurysm. 3. Evidence of prior median sternotomy. 4. Artificial aortic valve. 5. Focal scarring and calcification within the lower pole of the left kidney with an adjacent area of low attenuation which may represent a small renal cyst. Correlation with renal ultrasound is recommended. Electronically Signed   By: Virgina Norfolk M.D.   On: 06/22/2019 17:59   US Abdomen Limited RUQ  Result Date: 06/22/2019 CLINICAL DATA:  Biliary disease. EXAM: ULTRASOUND ABDOMEN LIMITED RIGHT UPPER QUADRANT COMPARISON:  CT abdomen/pelvis 06/22/2019 FINDINGS: Gallbladder: Cholecystolithiasis. Visualized gallstones measure up to 6 mm. Thick-walled gallbladder measuring 11 mm. Pericholecystic fluid. A sonographic Murphy sign was elicited by the scanning technologist. Common bile duct: Diameter: 4 mm proximally, 5-6 mm distally. Liver: No focal lesion identified. Increased hepatic parenchymal echogenicity. Interrogated portal vein is patent on color Doppler imaging with normal direction of blood flow towards the liver. IMPRESSION: Cholecystolithiasis with  gallbladder wall thickening and pericholecystic fluid. A sonographic Murphy sign was elicited by the scanning technologist. This constellation of findings is highly suggestive of acute cholecystitis. Increased hepatic parenchymal echogenicity. While this is a nonspecific finding, increased density of the liver was demonstrated on CT performed earlier the same day. Query changes related to amiodarone therapy in this patient with a history of chronic heart failure. Electronically Signed   By: Kellie Simmering DO   On: 06/22/2019 21:40     Medications:     Current Medications: . amiodarone  200 mg Oral Daily  . aspirin EC  81 mg Oral Daily  . docusate sodium  100 mg Oral BID  . hydroxyurea  1,000 mg Oral Once per day on Mon Wed Fri  . [START ON 06/24/2019] hydroxyurea  500 mg Oral Once per day on Sun Tue Thu Sat  . levothyroxine  50 mcg Oral Q0600  . senna  1 tablet Oral BID    Infusions: . sodium chloride 1,000 mL (06/22/19 2236)  . sodium chloride 75 mL/hr at 06/23/19 0830  . piperacillin-tazobactam (ZOSYN)  IV 3.375 g (06/23/19 0831)      Patient Profile   Magnum Fluck is a 69 y.o. male with mechnical AVR and mechanical MVR on chronic coumadin, h/o VF arrest s/p ICD , CAD s/p CABG, chronic systolic HF EF 0000000, AAA and h/o essential thrombosis,  admitted for acute cholecystitis.   Assessment/Plan   1. Acute Cholecystitis: LFTs elevated but trending down. CBD not significantly dilated. Afebrile and WBC ct WNL. Blood cultures NGTD.  - continue IV abx, zosyn + IVFs.   - NPO for bowel rest.  - Per general surgery, plan likely conservative management w/ chole drain by IR but may require operative management w/ cholecystectomy if failure to improve   2. Chronic Systolic Heart Failure:  -Echo 05/2016 @ [facility], EF 40% -Echo 04/2017 EF 25-30% -Echo 04/2019 EF 20-25%. Diffuse hypokinesis, No LVH. RV normal. Mild AI, trace MR - s/p CRT-D.  - HF meds currently on hold due to hypotension + AKI.  Delene Loll, Coreg, Arlyce Harman + Farxiga) - evolumic from volume standpoint currently and NYHA Class II.  - recommend judicious use of IVFs during the perioperative period given severe LV dysfunction. Monitor volume status closely. Strict I/Os and daily wts.   3. CAD:  - h/o remote PCI in 1994 at [place] - s/p LHC in 2017 in the setting of Vfib Arrest w/ 3V CAD treated w/ CABG x 3 - no s/s angina  - continue ASA during perioperative period -  blocker on hold due to hypotension - statin on hold due to elevated LFTs   4. Mechanical Aortic Valve: - coumadin on hold for possible procedures - INR 2.4 (goal 2.0-3.0) - start IV heparin once INR < 2.0.   5. AAA:  - slight size progression on admit CT scan, now measuring at 4.6 x 4.5 cm (nonruptured) - followed by [facility], Dr. Oneida Alar - home  blocker on hold for low BP  - statin also on hold for elevated LFTs   6. H/o VF arrest and H/o CHB  - s/p BosSci CRT-D device, followed by EP  - on PO amiodarone 200 mg daily (LFTs abnormal 2/2 acute GI illness. Needs updated TFTs).   7. AKI:  -admit SCr 1.7 (baseline 1.1-1.2) -likely prerenal from dehydration 2/2 vomiting -improving w/ IVFs, down to 1.55 today.  -continue to hold Entresto, Farxiga, Spironolactone and Coreg given low BP   -repeat BMP in the am.   8. Preoperative Risk Assessment:  - stable from HF standpoint, NYHA Class II. Euvolemic  - no s/s of angina. Able to complete > 4METs of physical activity (ambulate a flight of stairs) w/o CP or exertional dyspnea.   - has functioning CRT-D. rhythm stable on tele.  - despite multiple cardiac issues, he is fairly stable and well compensated. He would be at higher risk for perioperative complications based on underlying cardiac problems and other comorbidities but surgery is not prohibitive.  - recommend continuation of ASA during the perioperative period. Agree w/ holding  blocker due to hypotension and statin due to elevated LFTs - recommend  judicious use of IVFs during the perioperative period w/ close monitoring of volume status given severe LV dysfunction. We will follow along closely with you.     Length of Stay: [place], PA-C  06/23/2019, 3:18 PM  Advanced Heart Failure Team Pager [phone] (M-F; 7a - 4p)  Please contact [facility] for night-coverage after hours (4p -7a ) and weekends on [email or web address]   Patient seen and examined with the above-signed Advanced Practice Provider and/or Housestaff. I personally reviewed laboratory data, imaging studies and relevant notes. I independently examined the patient and formulated the important aspects of the plan. I have edited the note to reflect any of my changes or salient points. I have personally discussed the plan  with the patient and/or family.  69 y/o male with CAD s/p CABG, s/p mechanical AVR, chronic systolic HF with EF 0000000 admitted with acute cholecystitis and AKI.   Currently improving with IVF but SBP remain in 80s. HF meds on hold.   At baseline NYHA II  General:  No resp difficulty HEENT: normal + icteric Neck: supple. no JVD. Carotids 2+ bilat; no bruits. No lymphadenopathy or thryomegaly appreciated. Cor: PMI nondisplaced. Regular rate & rhythm. Mechanical s2 Lungs: clear Abdomen: soft, + tender, nondistended. No hepatosplenomegaly. No bruits or masses. Good bowel sounds. Extremities: no cyanosis, clubbing, rash, edema Neuro: alert & orientedx3, cranial nerves grossly intact. moves all 4 extremities w/o difficulty. Affect pleasant  Currently stable from a cardiac standpoint though BP is borderline. Continue to hold HF meds while BP is low. Can use vasopressin for BP support as needed.  He is a moderate risk for peri-op CV complications with surgery. Agree with percutaneous C-tube as a temporizing measure followed but more definitive surgery as things settle down.  Will need heparin bridge for AVR once INR < 2.0. We will follow.   Glori Bickers, MD  4:05 PM

## 2019-06-23 NOTE — Progress Notes (Signed)
Pt transferred from ED to 4E25, appeared alert and oriented x 4, weak and lethargy, ambulated from strecher to bed with one assistance. CHG bath given, room and equipment instructions given. CCMD called with 2nd person verified. Call bell within reach, bed alarm on.  Pain scale 8/10 around belly button, his pain relived and tolerable after Oxycodone given.  Afebrile, temp 98.3-98.8 orally,  EKG showed V- paced on monitor, HR 80s,  BP 81/49- 92/76 mmHg, SPO2 94-95% on room air, RR 22 at rest, he presented some SOB with exertion, when transferring, RR 30 on arrival.  ED RN, Luna Glasgow reported MD already made aware that Pt's BP has been low and no new order received as long as Pt is asymptomatic due to his EF 20-25% and Pt already got 1,000 ml of NSS bolus in ED. We will continue to monitor.  Kennyth Lose, RN

## 2019-06-23 NOTE — Progress Notes (Signed)
[facility] PROGRESS NOTE    Progress Note  Taylor [name]  [id number] DOB: 19-Jun-1950 DOA: 06/22/2019 PCP: Michell Heinrich, DO     Brief Narrative:   Taylor [name] is an 69 y.o. male past medical history significant for CAD status post CABG, systolic heart failure with  EF 25% status post CRT-D, VT arrest in 2017, status post aortic mechanical valve replacement on Coumadin, AAA, essential hypertension who presents to the ED with 1 day of fever chills abdominal pain nausea and vomiting.  Patient appears jaundice, with a light-colored stools and dark urine in the ED was found to be in acute renal failure with elevated LFTs and INR of 2.1, CT scan of the abdomen and pelvis showed acute cholecystitis and a nonrupture 4.6 x 4.5 cm infrarenal AAA.  On Zosyn, surgery was consulted in the right upper quadrant ultrasound  Assessment/Plan:   Severe sepsis secondary to acute cholecystitis: She was started empirically on IV Zosyn, he has defervesced.  LFTs remain significantly elevated bilirubin is slightly trending up. CT scan of the abdomen and pelvis on 06/22/2019 showed choledocholithiasis with findings suggestive of acute cholecystitis. Abdominal ultrasound on 06/22/2019 show gallbladder wall thickening with choledocholithiasis pericholecystic fluid and Murphy's sign elicited by technologist. Surgery has been consulted recommended n.p.o. and to hold Coumadin, her INR continues to be therapeutic, awaiting cardiology's recommendation. She is mildly hypotensive this morning we will start her on gentle IV fluid hydration over the next 24 hours. Consult physical therapy out of bed to chair  Acute kidney injury: Prerenal antihypertensive medications were held specially Entresto and Aldactone. With a baseline creatinine around 1.1. Creatinine is slowly trending down we will resume IV fluids. Creatinine is slowly improving.  Preop evaluation: Surgery has consulted cardiology.  Aortic mechanical  valve on Coumadin: Holding Coumadin in anticipation of operative management. We have consulted pharmacy will bridge with IV heparin for procedure.  Essential hypertension/ischemic cardiomyopathy with an EF of 25%/hypotension: Holding antihypertensive medication Coreg, Entresto and Aldactone. Will start him on normal saline for the next 24 hours as he is currently n.p.o.  Essential thrombocytosis: Continue aspirin and hydroxyurea.  Hypothyroidism: Continue Synthroid.  Mild hyponatremia: likely hypovolemic, resolved with IV fluid hydration.  Normal anion gap metabolic acidosis Likely due to acute renal failure, resolved with IV fluid hydration.  Normocytic anemia Follow-up with PCP as an outpatient.    DVT prophylaxis: coumadin to to be bridge with heparin Family Communication:none Disposition Plan/Barrier to D/C: He came from home will probably go home.  Code Status:     Code Status Orders  (From admission, onward)         Start     Ordered   06/22/19 2038  Full code  Continuous     06/22/19 2038        Code Status History    This patient has a current code status but no historical code status.   Advance Care Planning Activity        IV Access:    Peripheral IV   Procedures and diagnostic studies:   CT ABDOMEN PELVIS WO CONTRAST  Result Date: 06/22/2019 CLINICAL DATA:  Abdominal pain. EXAM: CT CHEST, ABDOMEN AND PELVIS WITHOUT CONTRAST TECHNIQUE: Multidetector CT imaging of the chest, abdomen and pelvis was performed following the standard protocol without IV contrast. COMPARISON:  None. FINDINGS: CT CHEST FINDINGS Cardiovascular: There is a dual lead AICD. There is marked severity calcification of the thoracic aorta without evidence of aneurysmal dilatation. An artificial aortic valve is noted.  Normal heart size. No pericardial effusion. Marked severity coronary artery calcification is seen. Mediastinum/Nodes: Lungs/Pleura: Mild areas of atelectasis and/or  infiltrate are seen within the left upper lobe and posterior aspect of the left lung base. There is no evidence of a pleural effusion or pneumothorax. Musculoskeletal: Multiple sternal wires are seen. Multilevel degenerative changes are noted throughout the thoracic spine. CT ABDOMEN PELVIS FINDINGS Hepatobiliary: No focal liver abnormality is seen. Numerous tiny gallstones are seen within the lumen of a partially contracted gallbladder. A very mild amount of pericholecystic inflammation is noted. Pancreas: Unremarkable. No pancreatic ductal dilatation or surrounding inflammatory changes. Spleen: Normal in size without focal abnormality. Adrenals/Urinary Tract: Adrenal glands are unremarkable. Kidneys are normal in size, without renal calculi or hydronephrosis. Small areas of parenchymal calcification and adjacent focal scarring are seen within the lower pole the left kidney. A 2.5 cm x 1.6 cm adjacent area of low attenuation is seen within the posterior aspect of the lower pole on the left (axial CT image 37, CT series number 8). Bladder is unremarkable. Stomach/Bowel: Stomach is within normal limits. The appendix is not identified. No evidence of bowel wall thickening, distention, or inflammatory changes. Vascular/Lymphatic: There is marked severity aortic atherosclerosis. 4.6 cm x 4.5 cm aneurysmal dilatation of the infrarenal abdominal aorta is seen. No enlarged abdominal or pelvic lymph nodes. Reproductive: The prostate gland is mildly enlarged. Other: No abdominal wall hernia or abnormality. No abdominopelvic ascites. Musculoskeletal: Multilevel degenerative changes are seen throughout the lumbar spine. IMPRESSION: 1. Cholelithiasis with additional findings suggestive of acute cholecystitis. 2. 4.6 cm x 4.5 cm infrarenal abdominal aortic aneurysm. 3. Evidence of prior median sternotomy. 4. Artificial aortic valve. 5. Focal scarring and calcification within the lower pole of the left kidney with an adjacent  area of low attenuation which may represent a small renal cyst. Correlation with renal ultrasound is recommended. Electronically Signed   By: Virgina Norfolk M.D.   On: 06/22/2019 17:59   CT CHEST WO CONTRAST  Result Date: 06/22/2019 CLINICAL DATA:  Abdominal pain. EXAM: CT CHEST, ABDOMEN AND PELVIS WITHOUT CONTRAST TECHNIQUE: Multidetector CT imaging of the chest, abdomen and pelvis was performed following the standard protocol without IV contrast. COMPARISON:  None. FINDINGS: CT CHEST FINDINGS Cardiovascular: There is a dual lead AICD. There is marked severity calcification of the thoracic aorta without evidence of aneurysmal dilatation. An artificial aortic valve is noted. Normal heart size. No pericardial effusion. Marked severity coronary artery calcification is seen. Mediastinum/Nodes: Lungs/Pleura: Mild areas of atelectasis and/or infiltrate are seen within the left upper lobe and posterior aspect of the left lung base. There is no evidence of a pleural effusion or pneumothorax. Musculoskeletal: Multiple sternal wires are seen. Multilevel degenerative changes are noted throughout the thoracic spine. CT ABDOMEN PELVIS FINDINGS Hepatobiliary: No focal liver abnormality is seen. Numerous tiny gallstones are seen within the lumen of a partially contracted gallbladder. A very mild amount of pericholecystic inflammation is noted. Pancreas: Unremarkable. No pancreatic ductal dilatation or surrounding inflammatory changes. Spleen: Normal in size without focal abnormality. Adrenals/Urinary Tract: Adrenal glands are unremarkable. Kidneys are normal in size, without renal calculi or hydronephrosis. Small areas of parenchymal calcification and adjacent focal scarring are seen within the lower pole the left kidney. A 2.5 cm x 1.6 cm adjacent area of low attenuation is seen within the posterior aspect of the lower pole on the left (axial CT image 37, CT series number 8). Bladder is unremarkable. Stomach/Bowel:  Stomach is within normal limits. The appendix  is not identified. No evidence of bowel wall thickening, distention, or inflammatory changes. Vascular/Lymphatic: There is marked severity aortic atherosclerosis. 4.6 cm x 4.5 cm aneurysmal dilatation of the infrarenal abdominal aorta is seen. No enlarged abdominal or pelvic lymph nodes. Reproductive: The prostate gland is mildly enlarged. Other: No abdominal wall hernia or abnormality. No abdominopelvic ascites. Musculoskeletal: Multilevel degenerative changes are seen throughout the lumbar spine. IMPRESSION: 1. Cholelithiasis with additional findings suggestive of acute cholecystitis. 2. 4.6 cm x 4.5 cm infrarenal abdominal aortic aneurysm. 3. Evidence of prior median sternotomy. 4. Artificial aortic valve. 5. Focal scarring and calcification within the lower pole of the left kidney with an adjacent area of low attenuation which may represent a small renal cyst. Correlation with renal ultrasound is recommended. Electronically Signed   By: Virgina Norfolk M.D.   On: 06/22/2019 17:59   US Abdomen Limited RUQ  Result Date: 06/22/2019 CLINICAL DATA:  Biliary disease. EXAM: ULTRASOUND ABDOMEN LIMITED RIGHT UPPER QUADRANT COMPARISON:  CT abdomen/pelvis 06/22/2019 FINDINGS: Gallbladder: Cholecystolithiasis. Visualized gallstones measure up to 6 mm. Thick-walled gallbladder measuring 11 mm. Pericholecystic fluid. A sonographic Murphy sign was elicited by the scanning technologist. Common bile duct: Diameter: 4 mm proximally, 5-6 mm distally. Liver: No focal lesion identified. Increased hepatic parenchymal echogenicity. Interrogated portal vein is patent on color Doppler imaging with normal direction of blood flow towards the liver. IMPRESSION: Cholecystolithiasis with gallbladder wall thickening and pericholecystic fluid. A sonographic Murphy sign was elicited by the scanning technologist. This constellation of findings is highly suggestive of acute cholecystitis.  Increased hepatic parenchymal echogenicity. While this is a nonspecific finding, increased density of the liver was demonstrated on CT performed earlier the same day. Query changes related to amiodarone therapy in this patient with a history of chronic heart failure. Electronically Signed   By: Kellie Simmering DO   On: 06/22/2019 21:40     Medical Consultants:    None.  Anti-Infectives:   IV Zosyn  Subjective:    Taylor [name] relates he is anorexic continues to have abdominal pain, but he relates is better than this morning.  Upon standing he has no dizziness.  Objective:    Vitals:   06/23/19 0326 06/23/19 0428 06/23/19 0445 06/23/19 0447  BP: (!) 80/47  (!) 85/58   Pulse: 71  77   Resp: 16 19 (!) 22   Temp:   98.3 F (36.8 C)   TempSrc:   Oral   SpO2: 93%  96%   Weight:    75.4 kg  Height:       SpO2: 96 %   Intake/Output Summary (Last 24 hours) at 06/23/2019 0704 Last data filed at 06/23/2019 0439 Gross per 24 hour  Intake 1586.67 ml  Output 3 ml  Net 1583.67 ml   Filed Weights   06/22/19 2200 06/23/19 0447  Weight: 75.4 kg 75.4 kg    Exam: General exam: In no acute distress. Respiratory system: Good air movement and clear to auscultation. Cardiovascular system: S1 & S2 heard, RRR. No JVD. Gastrointestinal system: Abdomen is nondistended, soft and nontender.  Extremities: No pedal edema. Skin: No rashes, lesions or ulcers Psychiatry: Judgement and insight appear normal. Mood & affect appropriate.   Data Reviewed:    Labs: Basic Metabolic Panel: Recent Labs  Lab 06/22/19 1601 06/22/19 2038 06/22/19 2216  NA 133*  --  135  K 3.8  --  3.7  CL 101  --  104  CO2 19*  --  19*  GLUCOSE 135*  --  95  BUN 26*  --  28*  CREATININE 1.70*  --  1.55*  CALCIUM 8.9  --  8.4*  MG  --  1.6*  --   PHOS  --  4.9*  --    GFR Estimated Creatinine Clearance: 45 mL/min (A) (by C-G formula based on SCr of 1.55 mg/dL (H)). Liver Function Tests: Recent Labs  Lab  06/22/19 1601 06/22/19 2216  AST 295* 268*  ALT 257* 242*  ALKPHOS 146* 131*  BILITOT 4.9* 5.2*  PROT 6.5 5.9*  ALBUMIN 3.2* 2.8*   Recent Labs  Lab 06/22/19 1601  LIPASE 14   No results for input(s): AMMONIA in the last 168 hours. Coagulation profile Recent Labs  Lab 06/22/19 1601 06/23/19 0246  INR 2.4* 2.4*   COVID-19 Labs  No results for input(s): DDIMER, FERRITIN, LDH, CRP in the last 72 hours.  Lab Results  Component Value Date   [place] NEGATIVE 06/22/2019    CBC: Recent Labs  Lab 06/22/19 1601 06/22/19 2216  WBC 8.2 7.3  HGB 13.4 12.3*  HCT 38.3* 34.5*  MCV 123.9* 123.2*  PLT 290 232   Cardiac Enzymes: No results for input(s): CKTOTAL, CKMB, CKMBINDEX, TROPONINI in the last 168 hours. BNP (last 3 results) No results for input(s): PROBNP in the last 8760 hours. CBG: No results for input(s): GLUCAP in the last 168 hours. D-Dimer: No results for input(s): DDIMER in the last 72 hours. Hgb A1c: No results for input(s): HGBA1C in the last 72 hours. Lipid Profile: No results for input(s): CHOL, HDL, LDLCALC, TRIG, CHOLHDL, LDLDIRECT in the last 72 hours. Thyroid function studies: No results for input(s): TSH, T4TOTAL, T3FREE, THYROIDAB in the last 72 hours.  Invalid input(s): FREET3 Anemia work up: No results for input(s): VITAMINB12, FOLATE, FERRITIN, TIBC, IRON, RETICCTPCT in the last 72 hours. Sepsis Labs: Recent Labs  Lab 06/22/19 1601 06/22/19 2038 06/22/19 2216  WBC 8.2  --  7.3  LATICACIDVEN  --  1.3  --    Microbiology Recent Results (from the past 240 hour(s))  Respiratory Panel by RT PCR (Flu A&B, Covid) - Nasopharyngeal Swab     Status: None   Collection Time: 06/22/19  6:57 PM   Specimen: Nasopharyngeal Swab  Result Value Ref Range Status   SARS Coronavirus 2 by RT PCR NEGATIVE NEGATIVE Final    Comment: (NOTE) SARS-CoV-2 target nucleic acids are NOT DETECTED. The SARS-CoV-2 RNA is generally detectable in upper  respiratoy specimens during the acute phase of infection. The lowest concentration of SARS-CoV-2 viral copies this assay can detect is 131 copies/mL. A negative result does not preclude SARS-Cov-2 infection and should not be used as the sole basis for treatment or other patient management decisions. A negative result may occur with  improper specimen collection/handling, submission of specimen other than nasopharyngeal swab, presence of viral mutation(s) within the areas targeted by this assay, and inadequate number of viral copies (<131 copies/mL). A negative result must be combined with clinical observations, patient history, and epidemiological information. The expected result is Negative. Fact Sheet for Patients:  PinkCheek.be Fact Sheet for Healthcare Providers:  GravelBags.it This test is not yet ap proved or cleared by the [place] FDA and  has been authorized for detection and/or diagnosis of SARS-CoV-2 by FDA under an Emergency Use Authorization (EUA). This EUA will remain  in effect (meaning this test can be used) for the duration of the COVID-19 declaration under Section 564(b)(1) of the Act, 21 U.S.C. section 360bbb-3(b)(1), unless the  authorization is terminated or revoked sooner.    Influenza A by PCR NEGATIVE NEGATIVE Final   Influenza B by PCR NEGATIVE NEGATIVE Final    Comment: (NOTE) The Xpert Xpress SARS-CoV-2/FLU/RSV assay is intended as an aid in  the diagnosis of influenza from Nasopharyngeal swab specimens and  should not be used as a sole basis for treatment. Nasal washings and  aspirates are unacceptable for Xpert Xpress SARS-CoV-2/FLU/RSV  testing. Fact Sheet for Patients: PinkCheek.be Fact Sheet for Healthcare Providers: GravelBags.it This test is not yet approved or cleared by the [place] FDA and  has been authorized for  detection and/or diagnosis of SARS-CoV-2 by  FDA under an Emergency Use Authorization (EUA). This EUA will remain  in effect (meaning this test can be used) for the duration of the  Covid-19 declaration under Section 564(b)(1) of the Act, 21  U.S.C. section 360bbb-3(b)(1), unless the authorization is  terminated or revoked. Performed at [facility], [place] [address] [place], [place] [address]      Medications:   . amiodarone  200 mg Oral Daily  . aspirin EC  81 mg Oral Daily  . docusate sodium  100 mg Oral BID  . hydroxyurea  1,000 mg Oral Once per day on Mon Wed Fri  . [START ON 06/24/2019] hydroxyurea  500 mg Oral Once per day on Sun Tue Thu Sat  . levothyroxine  50 mcg Oral Q0600  . senna  1 tablet Oral BID   Continuous Infusions: . sodium chloride 1,000 mL (06/22/19 2236)  . piperacillin-tazobactam (ZOSYN)  IV 3.375 g (06/23/19 0003)      LOS: 1 day   Charlynne Cousins  [facility]  06/23/2019, 7:04 AM

## 2019-06-23 NOTE — Progress Notes (Signed)
Taylor [name] came to CT for a CTA Chest, Abdomen and Pelvis for Dissection from ED with an 29 G in his RAC that was placed by ED provider(Trifan, MD) with U/S guidance. IV infiltrated with hand flush. EDP(Trifan) came to CT and started another 18G IV in Left AC with U/S Guidance. IV flushed without any obvious signs of infiltration when flushed with saline by hand and with pressure injector. When contrast was injected, no IV contrast was seen on initial monitoring scans, and contrast was stopped after app 60 cc infused. Contrast had extravasated into the left AC area. Actual extravasation volume is estimated to be 40-60 cc's. Dr. Enriqueta Shutter, radiologist, was called to examine the arm. This was done while EDP(Trifan) was in the room attempting to start another line. Dr. Enriqueta Shutter communicated verbal instructions to EDP regarding extravasation follow up and I entered post extravasation orders in Epic. EDP was unable to obtain a viable IV line, so scan was completed without IV contrast. Instructions were also verbalized to the patient and he acknowledged understanding.

## 2019-06-23 NOTE — Telephone Encounter (Signed)
Confirmed [employer] received provider portion of the application. Application is still in progress. [employer] plans to have re-enrollments done by the end of February.  Charlann Boxer, CPhT

## 2019-06-24 LAB — HEPATIC FUNCTION PANEL
ALT: 207 U/L — ABNORMAL HIGH (ref 0–44)
AST: 179 U/L — ABNORMAL HIGH (ref 15–41)
Albumin: 2.4 g/dL — ABNORMAL LOW (ref 3.5–5.0)
Alkaline Phosphatase: 96 U/L (ref 38–126)
Bilirubin, Direct: 0.9 mg/dL — ABNORMAL HIGH (ref 0.0–0.2)
Indirect Bilirubin: 1.2 mg/dL — ABNORMAL HIGH (ref 0.3–0.9)
Total Bilirubin: 2.1 mg/dL — ABNORMAL HIGH (ref 0.3–1.2)
Total Protein: 5.4 g/dL — ABNORMAL LOW (ref 6.5–8.1)

## 2019-06-24 LAB — PROTIME-INR
INR: 2.1 — ABNORMAL HIGH (ref 0.8–1.2)
INR: 2.5 — ABNORMAL HIGH (ref 0.8–1.2)
Prothrombin Time: 23.2 seconds — ABNORMAL HIGH (ref 11.4–15.2)
Prothrombin Time: 27.1 seconds — ABNORMAL HIGH (ref 11.4–15.2)

## 2019-06-24 LAB — GLUCOSE, CAPILLARY
Glucose-Capillary: 60 mg/dL — ABNORMAL LOW (ref 70–99)
Glucose-Capillary: 131 mg/dL — ABNORMAL HIGH (ref 70–99)

## 2019-06-24 LAB — BASIC METABOLIC PANEL
Anion gap: 12 (ref 5–15)
BUN: 36 mg/dL — ABNORMAL HIGH (ref 8–23)
CO2: 19 mmol/L — ABNORMAL LOW (ref 22–32)
Calcium: 8.2 mg/dL — ABNORMAL LOW (ref 8.9–10.3)
Chloride: 105 mmol/L (ref 98–111)
Creatinine, Ser: 1.59 mg/dL — ABNORMAL HIGH (ref 0.61–1.24)
GFR calc Af Amer: 51 mL/min — ABNORMAL LOW (ref >60)
GFR calc non Af Amer: 44 mL/min — ABNORMAL LOW (ref >60)
Glucose, Bld: 67 mg/dL — ABNORMAL LOW (ref 70–99)
Potassium: 3.2 mmol/L — ABNORMAL LOW (ref 3.5–5.1)
Sodium: 136 mmol/L (ref 135–145)

## 2019-06-24 MED ORDER — POTASSIUM CHLORIDE 10 MEQ/100ML IV SOLN
10.0000 meq | INTRAVENOUS | Status: AC
  Administered 2019-06-24 (×2): 10 meq via INTRAVENOUS
  Filled 2019-06-24 (×2): qty 100

## 2019-06-24 MED ORDER — DEXTROSE 50 % IV SOLN
INTRAVENOUS | Status: AC
  Administered 2019-06-24: 07:00:00 25 mL
  Filled 2019-06-24: qty 50

## 2019-06-24 MED ORDER — SODIUM CHLORIDE 0.9% FLUSH: 10.0000 mL | INTRAVENOUS | Status: DC | PRN

## 2019-06-24 MED ORDER — SODIUM CHLORIDE 0.9% FLUSH
10.0000 mL | Freq: Two times a day (BID) | INTRAVENOUS | Status: DC
  Administered 2019-06-25 (×2): 10 mL

## 2019-06-24 NOTE — Progress Notes (Signed)
Physical Therapy Treatment Patient Details Name: Taylor [name] MRN: [id number] DOB: [birth year] Today's Date: 06/24/2019    History of Present Illness Patient is a 69 y/o male who presents with abdominal pain, N/V. CHest CT-cholelithiasis with acute Cholecystitis and elevated LFTs. Admitted with sepsis secondary to acute choleycystitis. Plan for drain placement after clearance from Cardiology. PMH includes HTN, HLD, mechanical valve replacement,ventricular tachycardia, CHF.    PT Comments    Patient progressing well towards PT goals. Tolerated gait training today with and without use of RW. Requires Min guard-Min A for balance/safety. Balance looks better with UE support. Reports feeling better after getting to eat something. BP stable today, 125/71. Plans to have surgery tomorrow pending blood levels.  Will continue to follow and progress as tolerated to improve strength, endurance and overall safe mobility.   Follow Up Recommendations  Home health PT;Supervision - Intermittent     Equipment Recommendations  None recommended by PT    Recommendations for Other Services       Precautions / Restrictions Precautions Precautions: Fall Restrictions Weight Bearing Restrictions: No    Mobility  Bed Mobility Overal bed mobility: Needs Assistance Bed Mobility: Supine to Sit;Sit to Supine     Supine to sit: Supervision;HOB elevated Sit to supine: Supervision;HOB elevated   General bed mobility comments: No assist needed, use of rail.  Transfers Overall transfer level: Needs assistance Equipment used: Rolling walker (2 wheeled) Transfers: Sit to/from Stand Sit to Stand: Min guard         General transfer comment: Min guard for safety. Stood from [employer].  Ambulation/Gait Ambulation/Gait assistance: Min guard Gait Distance (Feet): 60 Feet Assistive device: Rolling walker (2 wheeled) Gait Pattern/deviations: Step-to pattern;Step-through pattern;Decreased stride length;Trunk  flexed;Wide base of support Gait velocity: decreased   General Gait Details: Slow, mildly unsteady gait with step to gait initially progressing to step through gait with cues to not pick up RW. VSS throughout. BP stable today. Walked halfway with RW and halfway without RW- more stable with DME.   Stairs             Wheelchair Mobility    Modified Rankin (Stroke Patients Only)       Balance Overall balance assessment: Needs assistance Sitting-balance support: Feet supported;No upper extremity supported Sitting balance-Leahy Scale: Good     Standing balance support: During functional activity Standing balance-Leahy Scale: Fair Standing balance comment: Min guard for safety. Does better with UE support.                            Cognition Arousal/Alertness: Awake/alert Behavior During Therapy: WFL for tasks assessed/performed Overall Cognitive Status: Within Functional Limits for tasks assessed                                        Exercises      General Comments General comments (skin integrity, edema, etc.): BP stable today 125/71.      Pertinent Vitals/Pain Pain Assessment: No/denies pain    Home Living                      Prior Function            PT Goals (current goals can now be found in the care plan section) Progress towards PT goals: Progressing toward goals    Frequency  Min 3X/week      PT Plan Current plan remains appropriate    Co-evaluation              AM-PAC PT "6 Clicks" Mobility   Outcome Measure  Help needed turning from your back to your side while in a flat bed without using bedrails?: None Help needed moving from lying on your back to sitting on the side of a flat bed without using bedrails?: A Little Help needed moving to and from a bed to a chair (including a wheelchair)?: A Little Help needed standing up from a chair using your arms (e.g., wheelchair or bedside chair)?: A  Little Help needed to walk in hospital room?: A Little Help needed climbing 3-5 steps with a railing? : A Little 6 Click Score: 19    End of Session Equipment Utilized During Treatment: Gait belt Activity Tolerance: Patient tolerated treatment well Patient left: in bed;with call bell/phone within reach Nurse Communication: Mobility status PT Visit Diagnosis: Muscle weakness (generalized) (M62.81)     Time: [id number] PT Time Calculation (min) (ACUTE ONLY): 18 min  Charges:  $Gait Training: 8-22 mins                     Marisa Severin, PT, DPT Acute Rehabilitation Services Pager [phone] Office [phone]       [name] 06/24/2019, 4:00 PM

## 2019-06-24 NOTE — Progress Notes (Addendum)
Advanced Heart Failure Rounding Note   Subjective:    Ab pain much better. Able to tolerate clears. Afebrile.   SBP now 90-100s. No CP or SOB. Rhythm stable.    Objective:   Weight Range:  Vital Signs:   Temp:  [97.9 F (36.6 C)-98.7 F (37.1 C)] 97.9 F (36.6 C) (01/26 1220) Pulse Rate:  [69-77] 71 (01/26 1220) Resp:  [15-21] 15 (01/26 1220) BP: (91-108)/(49-61) 105/61 (01/26 1220) SpO2:  [94 %-97 %] 95 % (01/26 1220) Weight:  [75.5 kg] 75.5 kg (01/26 0351) Last BM Date: 06/23/19  Weight change: Filed Weights   06/22/19 2200 06/23/19 0447 06/24/19 0351  Weight: 75.4 kg 75.4 kg 75.5 kg    Intake/Output:   Intake/Output Summary (Last 24 hours) at 06/24/2019 1312 Last data filed at 06/24/2019 0846 Gross per 24 hour  Intake 2074.52 ml  Output 1050 ml  Net 1024.52 ml     Physical Exam: General:  Well appearing. No resp difficulty HEENT: normal Neck: supple. JVP 8. Carotids 2+ bilat; no bruits. No lymphadenopathy or thryomegaly appreciated. Cor: PMI nondisplaced. Regular rate & rhythm. No rubs, gallops or murmurs. Lungs: clear Abdomen: soft, nontender, nondistended. No hepatosplenomegaly. No bruits or masses. Good bowel sounds. Extremities: no cyanosis, clubbing, rash, edema Neuro: alert & orientedx3, cranial nerves grossly intact. moves all 4 extremities w/o difficulty. Affect pleasant  Telemetry: NSR 70s Personally reviewed   Labs: Basic Metabolic Panel: Recent Labs  Lab 06/22/19 1601 06/22/19 2038 06/22/19 2216 06/24/19 0338  NA 133*  --  135 136  K 3.8  --  3.7 3.2*  CL 101  --  104 105  CO2 19*  --  19* 19*  GLUCOSE 135*  --  95 67*  BUN 26*  --  28* 36*  CREATININE 1.70*  --  1.55* 1.59*  CALCIUM 8.9  --  8.4* 8.2*  MG  --  1.6*  --   --   PHOS  --  4.9*  --   --     Liver Function Tests: Recent Labs  Lab 06/22/19 1601 06/22/19 2216 06/24/19 0338  AST 295* 268* 179*  ALT 257* 242* 207*  ALKPHOS 146* 131* 96  BILITOT 4.9* 5.2*  2.1*  PROT 6.5 5.9* 5.4*  ALBUMIN 3.2* 2.8* 2.4*   Recent Labs  Lab 06/22/19 1601  LIPASE 14   No results for input(s): AMMONIA in the last 168 hours.  CBC: Recent Labs  Lab 06/22/19 1601 06/22/19 2216  WBC 8.2 7.3  HGB 13.4 12.3*  HCT 38.3* 34.5*  MCV 123.9* 123.2*  PLT 290 232    Cardiac Enzymes: No results for input(s): CKTOTAL, CKMB, CKMBINDEX, TROPONINI in the last 168 hours.  BNP: BNP (last 3 results) No results for input(s): BNP in the last 8760 hours.  ProBNP (last 3 results) No results for input(s): PROBNP in the last 8760 hours.    Other results:  Imaging: CT ABDOMEN PELVIS WO CONTRAST  Result Date: 06/22/2019 CLINICAL DATA:  Abdominal pain. EXAM: CT CHEST, ABDOMEN AND PELVIS WITHOUT CONTRAST TECHNIQUE: Multidetector CT imaging of the chest, abdomen and pelvis was performed following the standard protocol without IV contrast. COMPARISON:  None. FINDINGS: CT CHEST FINDINGS Cardiovascular: There is a dual lead AICD. There is marked severity calcification of the thoracic aorta without evidence of aneurysmal dilatation. An artificial aortic valve is noted. Normal heart size. No pericardial effusion. Marked severity coronary artery calcification is seen. Mediastinum/Nodes: Lungs/Pleura: Mild areas of atelectasis and/or infiltrate are  seen within the left upper lobe and posterior aspect of the left lung base. There is no evidence of a pleural effusion or pneumothorax. Musculoskeletal: Multiple sternal wires are seen. Multilevel degenerative changes are noted throughout the thoracic spine. CT ABDOMEN PELVIS FINDINGS Hepatobiliary: No focal liver abnormality is seen. Numerous tiny gallstones are seen within the lumen of a partially contracted gallbladder. A very mild amount of pericholecystic inflammation is noted. Pancreas: Unremarkable. No pancreatic ductal dilatation or surrounding inflammatory changes. Spleen: Normal in size without focal abnormality. Adrenals/Urinary  Tract: Adrenal glands are unremarkable. Kidneys are normal in size, without renal calculi or hydronephrosis. Small areas of parenchymal calcification and adjacent focal scarring are seen within the lower pole the left kidney. A 2.5 cm x 1.6 cm adjacent area of low attenuation is seen within the posterior aspect of the lower pole on the left (axial CT image 37, CT series number 8). Bladder is unremarkable. Stomach/Bowel: Stomach is within normal limits. The appendix is not identified. No evidence of bowel wall thickening, distention, or inflammatory changes. Vascular/Lymphatic: There is marked severity aortic atherosclerosis. 4.6 cm x 4.5 cm aneurysmal dilatation of the infrarenal abdominal aorta is seen. No enlarged abdominal or pelvic lymph nodes. Reproductive: The prostate gland is mildly enlarged. Other: No abdominal wall hernia or abnormality. No abdominopelvic ascites. Musculoskeletal: Multilevel degenerative changes are seen throughout the lumbar spine. IMPRESSION: 1. Cholelithiasis with additional findings suggestive of acute cholecystitis. 2. 4.6 cm x 4.5 cm infrarenal abdominal aortic aneurysm. 3. Evidence of prior median sternotomy. 4. Artificial aortic valve. 5. Focal scarring and calcification within the lower pole of the left kidney with an adjacent area of low attenuation which may represent a small renal cyst. Correlation with renal ultrasound is recommended. Electronically Signed   By: Virgina Norfolk M.D.   On: 06/22/2019 17:59   CT CHEST WO CONTRAST  Result Date: 06/22/2019 CLINICAL DATA:  Abdominal pain. EXAM: CT CHEST, ABDOMEN AND PELVIS WITHOUT CONTRAST TECHNIQUE: Multidetector CT imaging of the chest, abdomen and pelvis was performed following the standard protocol without IV contrast. COMPARISON:  None. FINDINGS: CT CHEST FINDINGS Cardiovascular: There is a dual lead AICD. There is marked severity calcification of the thoracic aorta without evidence of aneurysmal dilatation. An  artificial aortic valve is noted. Normal heart size. No pericardial effusion. Marked severity coronary artery calcification is seen. Mediastinum/Nodes: Lungs/Pleura: Mild areas of atelectasis and/or infiltrate are seen within the left upper lobe and posterior aspect of the left lung base. There is no evidence of a pleural effusion or pneumothorax. Musculoskeletal: Multiple sternal wires are seen. Multilevel degenerative changes are noted throughout the thoracic spine. CT ABDOMEN PELVIS FINDINGS Hepatobiliary: No focal liver abnormality is seen. Numerous tiny gallstones are seen within the lumen of a partially contracted gallbladder. A very mild amount of pericholecystic inflammation is noted. Pancreas: Unremarkable. No pancreatic ductal dilatation or surrounding inflammatory changes. Spleen: Normal in size without focal abnormality. Adrenals/Urinary Tract: Adrenal glands are unremarkable. Kidneys are normal in size, without renal calculi or hydronephrosis. Small areas of parenchymal calcification and adjacent focal scarring are seen within the lower pole the left kidney. A 2.5 cm x 1.6 cm adjacent area of low attenuation is seen within the posterior aspect of the lower pole on the left (axial CT image 37, CT series number 8). Bladder is unremarkable. Stomach/Bowel: Stomach is within normal limits. The appendix is not identified. No evidence of bowel wall thickening, distention, or inflammatory changes. Vascular/Lymphatic: There is marked severity aortic atherosclerosis. 4.6 cm x  4.5 cm aneurysmal dilatation of the infrarenal abdominal aorta is seen. No enlarged abdominal or pelvic lymph nodes. Reproductive: The prostate gland is mildly enlarged. Other: No abdominal wall hernia or abnormality. No abdominopelvic ascites. Musculoskeletal: Multilevel degenerative changes are seen throughout the lumbar spine. IMPRESSION: 1. Cholelithiasis with additional findings suggestive of acute cholecystitis. 2. 4.6 cm x 4.5 cm  infrarenal abdominal aortic aneurysm. 3. Evidence of prior median sternotomy. 4. Artificial aortic valve. 5. Focal scarring and calcification within the lower pole of the left kidney with an adjacent area of low attenuation which may represent a small renal cyst. Correlation with renal ultrasound is recommended. Electronically Signed   By: Virgina Norfolk M.D.   On: 06/22/2019 17:59   US Abdomen Limited RUQ  Result Date: 06/22/2019 CLINICAL DATA:  Biliary disease. EXAM: ULTRASOUND ABDOMEN LIMITED RIGHT UPPER QUADRANT COMPARISON:  CT abdomen/pelvis 06/22/2019 FINDINGS: Gallbladder: Cholecystolithiasis. Visualized gallstones measure up to 6 mm. Thick-walled gallbladder measuring 11 mm. Pericholecystic fluid. A sonographic Murphy sign was elicited by the scanning technologist. Common bile duct: Diameter: 4 mm proximally, 5-6 mm distally. Liver: No focal lesion identified. Increased hepatic parenchymal echogenicity. Interrogated portal vein is patent on color Doppler imaging with normal direction of blood flow towards the liver. IMPRESSION: Cholecystolithiasis with gallbladder wall thickening and pericholecystic fluid. A sonographic Murphy sign was elicited by the scanning technologist. This constellation of findings is highly suggestive of acute cholecystitis. Increased hepatic parenchymal echogenicity. While this is a nonspecific finding, increased density of the liver was demonstrated on CT performed earlier the same day. Query changes related to amiodarone therapy in this patient with a history of chronic heart failure. Electronically Signed   By: Kellie Simmering DO   On: 06/22/2019 21:40      Medications:     Scheduled Medications: . amiodarone  200 mg Oral Daily  . aspirin EC  81 mg Oral Daily  . docusate sodium  100 mg Oral BID  . hydroxyurea  1,000 mg Oral Once per day on Mon Wed Fri  . hydroxyurea  500 mg Oral Once per day on Sun Tue Thu Sat  . levothyroxine  50 mcg Oral Q0600  . senna  1  tablet Oral BID     Infusions: . sodium chloride 10 mL/hr at 06/24/19 0359  . piperacillin-tazobactam (ZOSYN)  IV 3.375 g (06/24/19 0748)     PRN Medications:  sodium chloride, acetaminophen **OR** acetaminophen, ondansetron **OR** ondansetron (ZOFRAN) IV, oxyCODONE, polyethylene glycol   Assessment/Plan:   1. Acute Cholecystitis:  - exam improved today. - at at least moderate risk for peri-op CV complications.  - Agree with perc drain today and definitive surgery when more stable.  - would stop IVF.   2. Chronic Systolic Heart Failure:  -Echo 05/2016 @ [facility], EF 40% -Echo 04/2017 EF 25-30% -Echo 04/2019 EF 20-25%. Diffuse hypokinesis, No LVH. RV normal. Mild AI, trace MR - s/p CRT-D.  - HF meds currently on hold due to hypotension + AKI. Delene Loll, Coreg, Arlyce Harman + Farxiga) - volume status looks good.  - Hold IVF  3. CAD:  - h/o remote PCI in 1994 at [place] - s/p LHC in 2017 in the setting of Vfib Arrest w/ 3V CAD treated w/ CABG x 3 - no s/s angina  - continue ASA during perioperative period - ? blocker on hold due to hypotension - statin on hold due to elevated LFTs   4. Mechanical Aortic Valve: - coumadin on hold for possible procedures - INR 2.1 (goal  2.0-3.0) - start IV heparin once INR < 2.0.   5. AAA:  - slight size progression on admit CT scan, now measuring at 4.6 x 4.5 cm (nonruptured) - followed by [facility], Dr. Oneida Alar - home ? blocker on hold for low BP  - statin also on hold for elevated LFTs  6. AKI:  -admit SCr 1.7 (baseline 1.1-1.2) -likely prerenal from dehydration 2/2 vomiting -improving w/ IVFs -continue to hold Entresto, Farxiga, Spironolactone and Coreg given low BP   - follow BMET  7. Hypokalemia - supp  Length of Stay: 2   Glori Bickers MD 06/24/2019, 1:12 PM  Advanced Heart Failure Team Pager [phone] (M-F; [place])  Please contact [facility] for night-coverage after hours (4p -7a ) and weekends on [email or web address]

## 2019-06-24 NOTE — Progress Notes (Signed)
Hypoglycemic Event  CBG: 60  Treatment: 61mL D50  Symptoms: none  Follow-up CBG: Time: 0650 CBG Result: 131  Possible Reasons for Event: NPO  Comments/MD notified: [place]

## 2019-06-24 NOTE — Progress Notes (Addendum)
[facility] PROGRESS NOTE    Progress Note  Taylor [name]  [id number] DOB: 08/20/1950 DOA: 06/22/2019 PCP: Michell Heinrich, DO     Brief Narrative:   Taylor [name] is an 69 y.o. male past medical history significant for CAD status post CABG, systolic heart failure with  EF 25% status post CRT-D, VT arrest in 2017, status post aortic mechanical valve replacement on Coumadin, AAA, essential hypertension who presents to the ED with 1 day of fever chills abdominal pain nausea and vomiting.  Patient appears jaundice, with a light-colored stools and dark urine in the ED was found to be in acute renal failure with elevated LFTs and INR of 2.1, CT scan of the abdomen and pelvis showed acute cholecystitis and a nonrupture 4.6 x 4.5 cm infrarenal AAA.  On Zosyn, Abdominal ultrasound on 06/22/2019 show gallbladder wall thickening with choledocholithiasis pericholecystic fluid and Murphy's sign elicited by technologist.  Assessment/Plan:   Severe sepsis secondary to acute cholecystitis: Continue IV Zosyn, he has defervesced.  LFTs are pending this morning. Surgery recommended percutaneous drainage cardiology was consulted and was he was cleared for surgery.  Keep him n.p.o. IR has been consulted and performed percutaneous biliary drain today. KVO IV fluids, hold antihypertensive medication his blood pressure is trending slightly up. I believe INR has to below 2.0 for IR to proceed.  Acute kidney injury: Likely prerenal in the setting of antihypertensive medication, Coreg Entresto and Aldactone were held. With a baseline creatinine around 1.1. Creatinine has remained stable at 1.5.  Preop evaluation: Has been consulted he is moderate risk for perioperative cardiovascular complications, will likely proceed with percutaneous biliary drain, can start heparin once INR below 2.  Aortic mechanical valve on Coumadin: Holding Coumadin in anticipation of operative management. INR today is 2.1 bridged  with heparin once INR less than 2.0.  Essential hypertension/ischemic cardiomyopathy with an EF of 25%/hypotension: Continue to hold antihypertensive medication Coreg, Entresto and Aldactone. Blood pressure seems to be stable.  Essential thrombocytosis: Continue aspirin and hydroxyurea.  Hypothyroidism: Continue Synthroid.  Mild hyponatremia: likely hypovolemic, resolved with IV fluid hydration.  Normal anion gap metabolic acidosis Likely due to acute renal failure, resolved with IV fluid hydration.  Normocytic anemia Follow-up with PCP as an outpatient.    DVT prophylaxis: coumadin to to be bridge with heparin Family Communication:none Disposition Plan/Barrier to D/C: He came from home will probably go home.  Code Status:     Code Status Orders  (From admission, onward)         Start     Ordered   06/22/19 2038  Full code  Continuous     06/22/19 2038        Code Status History    This patient has a current code status but no historical code status.   Advance Care Planning Activity        IV Access:    Peripheral IV   Procedures and diagnostic studies:   CT ABDOMEN PELVIS WO CONTRAST  Result Date: 06/22/2019 CLINICAL DATA:  Abdominal pain. EXAM: CT CHEST, ABDOMEN AND PELVIS WITHOUT CONTRAST TECHNIQUE: Multidetector CT imaging of the chest, abdomen and pelvis was performed following the standard protocol without IV contrast. COMPARISON:  None. FINDINGS: CT CHEST FINDINGS Cardiovascular: There is a dual lead AICD. There is marked severity calcification of the thoracic aorta without evidence of aneurysmal dilatation. An artificial aortic valve is noted. Normal heart size. No pericardial effusion. Marked severity coronary artery calcification is seen. Mediastinum/Nodes: Lungs/Pleura: Mild areas of  atelectasis and/or infiltrate are seen within the left upper lobe and posterior aspect of the left lung base. There is no evidence of a pleural effusion or  pneumothorax. Musculoskeletal: Multiple sternal wires are seen. Multilevel degenerative changes are noted throughout the thoracic spine. CT ABDOMEN PELVIS FINDINGS Hepatobiliary: No focal liver abnormality is seen. Numerous tiny gallstones are seen within the lumen of a partially contracted gallbladder. A very mild amount of pericholecystic inflammation is noted. Pancreas: Unremarkable. No pancreatic ductal dilatation or surrounding inflammatory changes. Spleen: Normal in size without focal abnormality. Adrenals/Urinary Tract: Adrenal glands are unremarkable. Kidneys are normal in size, without renal calculi or hydronephrosis. Small areas of parenchymal calcification and adjacent focal scarring are seen within the lower pole the left kidney. A 2.5 cm x 1.6 cm adjacent area of low attenuation is seen within the posterior aspect of the lower pole on the left (axial CT image 37, CT series number 8). Bladder is unremarkable. Stomach/Bowel: Stomach is within normal limits. The appendix is not identified. No evidence of bowel wall thickening, distention, or inflammatory changes. Vascular/Lymphatic: There is marked severity aortic atherosclerosis. 4.6 cm x 4.5 cm aneurysmal dilatation of the infrarenal abdominal aorta is seen. No enlarged abdominal or pelvic lymph nodes. Reproductive: The prostate gland is mildly enlarged. Other: No abdominal wall hernia or abnormality. No abdominopelvic ascites. Musculoskeletal: Multilevel degenerative changes are seen throughout the lumbar spine. IMPRESSION: 1. Cholelithiasis with additional findings suggestive of acute cholecystitis. 2. 4.6 cm x 4.5 cm infrarenal abdominal aortic aneurysm. 3. Evidence of prior median sternotomy. 4. Artificial aortic valve. 5. Focal scarring and calcification within the lower pole of the left kidney with an adjacent area of low attenuation which may represent a small renal cyst. Correlation with renal ultrasound is recommended. Electronically Signed    By: Virgina Norfolk M.D.   On: 06/22/2019 17:59   CT CHEST WO CONTRAST  Result Date: 06/22/2019 CLINICAL DATA:  Abdominal pain. EXAM: CT CHEST, ABDOMEN AND PELVIS WITHOUT CONTRAST TECHNIQUE: Multidetector CT imaging of the chest, abdomen and pelvis was performed following the standard protocol without IV contrast. COMPARISON:  None. FINDINGS: CT CHEST FINDINGS Cardiovascular: There is a dual lead AICD. There is marked severity calcification of the thoracic aorta without evidence of aneurysmal dilatation. An artificial aortic valve is noted. Normal heart size. No pericardial effusion. Marked severity coronary artery calcification is seen. Mediastinum/Nodes: Lungs/Pleura: Mild areas of atelectasis and/or infiltrate are seen within the left upper lobe and posterior aspect of the left lung base. There is no evidence of a pleural effusion or pneumothorax. Musculoskeletal: Multiple sternal wires are seen. Multilevel degenerative changes are noted throughout the thoracic spine. CT ABDOMEN PELVIS FINDINGS Hepatobiliary: No focal liver abnormality is seen. Numerous tiny gallstones are seen within the lumen of a partially contracted gallbladder. A very mild amount of pericholecystic inflammation is noted. Pancreas: Unremarkable. No pancreatic ductal dilatation or surrounding inflammatory changes. Spleen: Normal in size without focal abnormality. Adrenals/Urinary Tract: Adrenal glands are unremarkable. Kidneys are normal in size, without renal calculi or hydronephrosis. Small areas of parenchymal calcification and adjacent focal scarring are seen within the lower pole the left kidney. A 2.5 cm x 1.6 cm adjacent area of low attenuation is seen within the posterior aspect of the lower pole on the left (axial CT image 37, CT series number 8). Bladder is unremarkable. Stomach/Bowel: Stomach is within normal limits. The appendix is not identified. No evidence of bowel wall thickening, distention, or inflammatory changes.  Vascular/Lymphatic: There is marked severity  aortic atherosclerosis. 4.6 cm x 4.5 cm aneurysmal dilatation of the infrarenal abdominal aorta is seen. No enlarged abdominal or pelvic lymph nodes. Reproductive: The prostate gland is mildly enlarged. Other: No abdominal wall hernia or abnormality. No abdominopelvic ascites. Musculoskeletal: Multilevel degenerative changes are seen throughout the lumbar spine. IMPRESSION: 1. Cholelithiasis with additional findings suggestive of acute cholecystitis. 2. 4.6 cm x 4.5 cm infrarenal abdominal aortic aneurysm. 3. Evidence of prior median sternotomy. 4. Artificial aortic valve. 5. Focal scarring and calcification within the lower pole of the left kidney with an adjacent area of low attenuation which may represent a small renal cyst. Correlation with renal ultrasound is recommended. Electronically Signed   By: Virgina Norfolk M.D.   On: 06/22/2019 17:59   US Abdomen Limited RUQ  Result Date: 06/22/2019 CLINICAL DATA:  Biliary disease. EXAM: ULTRASOUND ABDOMEN LIMITED RIGHT UPPER QUADRANT COMPARISON:  CT abdomen/pelvis 06/22/2019 FINDINGS: Gallbladder: Cholecystolithiasis. Visualized gallstones measure up to 6 mm. Thick-walled gallbladder measuring 11 mm. Pericholecystic fluid. A sonographic Murphy sign was elicited by the scanning technologist. Common bile duct: Diameter: 4 mm proximally, 5-6 mm distally. Liver: No focal lesion identified. Increased hepatic parenchymal echogenicity. Interrogated portal vein is patent on color Doppler imaging with normal direction of blood flow towards the liver. IMPRESSION: Cholecystolithiasis with gallbladder wall thickening and pericholecystic fluid. A sonographic Murphy sign was elicited by the scanning technologist. This constellation of findings is highly suggestive of acute cholecystitis. Increased hepatic parenchymal echogenicity. While this is a nonspecific finding, increased density of the liver was demonstrated on CT performed  earlier the same day. Query changes related to amiodarone therapy in this patient with a history of chronic heart failure. Electronically Signed   By: Kellie Simmering DO   On: 06/22/2019 21:40     Medical Consultants:    None.  Anti-Infectives:   IV Zosyn  Subjective:    Taylor [name] has no new complaints except for he is hungry.  Objective:    Vitals:   06/23/19 1951 06/23/19 2326 06/24/19 0350 06/24/19 0351  BP: (!) 93/50 91/61 (!) 108/58   Pulse: 77 75 76   Resp: 18 17 20    Temp: 98.2 F (36.8 C) 98.5 F (36.9 C) 98 F (36.7 C)   TempSrc: Oral Oral Oral   SpO2: 96% 94% 95%   Weight:    75.5 kg  Height:       SpO2: 95 %   Intake/Output Summary (Last 24 hours) at 06/24/2019 0713 Last data filed at 06/24/2019 0400 Gross per 24 hour  Intake 2074.52 ml  Output 725 ml  Net 1349.52 ml   Filed Weights   06/22/19 2200 06/23/19 0447 06/24/19 0351  Weight: 75.4 kg 75.4 kg 75.5 kg    Exam: General exam: In no acute distress. Respiratory system: Good air movement and clear to auscultation. Cardiovascular system: S1 & S2 heard, RRR. No JVD. Gastrointestinal system: Abdomen is nondistended, soft and nontender.  Extremities: No pedal edema. Skin: No rashes, lesions or ulcers Psychiatry: Judgement and insight appear normal. Mood & affect appropriate.   Data Reviewed:    Labs: Basic Metabolic Panel: Recent Labs  Lab 06/22/19 1601 06/22/19 1601 06/22/19 2038 06/22/19 2216 06/24/19 0338  NA 133*  --   --  135 136  K 3.8   < >  --  3.7 3.2*  CL 101  --   --  104 105  CO2 19*  --   --  19* 19*  GLUCOSE 135*  --   --  95 67*  BUN 26*  --   --  28* 36*  CREATININE 1.70*  --   --  1.55* 1.59*  CALCIUM 8.9  --   --  8.4* 8.2*  MG  --   --  1.6*  --   --   PHOS  --   --  4.9*  --   --    < > = values in this interval not displayed.   GFR Estimated Creatinine Clearance: 43.8 mL/min (A) (by C-G formula based on SCr of 1.59 mg/dL (H)). Liver Function  Tests: Recent Labs  Lab 06/22/19 1601 06/22/19 2216  AST 295* 268*  ALT 257* 242*  ALKPHOS 146* 131*  BILITOT 4.9* 5.2*  PROT 6.5 5.9*  ALBUMIN 3.2* 2.8*   Recent Labs  Lab 06/22/19 1601  LIPASE 14   No results for input(s): AMMONIA in the last 168 hours. Coagulation profile Recent Labs  Lab 06/22/19 1601 06/23/19 0246 06/24/19 0338  INR 2.4* 2.4* 2.1*   COVID-19 Labs  No results for input(s): DDIMER, FERRITIN, LDH, CRP in the last 72 hours.  Lab Results  Component Value Date   [place] NEGATIVE 06/22/2019    CBC: Recent Labs  Lab 06/22/19 1601 06/22/19 2216  WBC 8.2 7.3  HGB 13.4 12.3*  HCT 38.3* 34.5*  MCV 123.9* 123.2*  PLT 290 232   Cardiac Enzymes: No results for input(s): CKTOTAL, CKMB, CKMBINDEX, TROPONINI in the last 168 hours. BNP (last 3 results) No results for input(s): PROBNP in the last 8760 hours. CBG: Recent Labs  Lab 06/24/19 0629 06/24/19 0649  GLUCAP 60* 131*   D-Dimer: No results for input(s): DDIMER in the last 72 hours. Hgb A1c: No results for input(s): HGBA1C in the last 72 hours. Lipid Profile: No results for input(s): CHOL, HDL, LDLCALC, TRIG, CHOLHDL, LDLDIRECT in the last 72 hours. Thyroid function studies: No results for input(s): TSH, T4TOTAL, T3FREE, THYROIDAB in the last 72 hours.  Invalid input(s): FREET3 Anemia work up: No results for input(s): VITAMINB12, FOLATE, FERRITIN, TIBC, IRON, RETICCTPCT in the last 72 hours. Sepsis Labs: Recent Labs  Lab 06/22/19 1601 06/22/19 2038 06/22/19 2216  WBC 8.2  --  7.3  LATICACIDVEN  --  1.3  --    Microbiology Recent Results (from the past 240 hour(s))  Respiratory Panel by RT PCR (Flu A&B, Covid) - Nasopharyngeal Swab     Status: None   Collection Time: 06/22/19  6:57 PM   Specimen: Nasopharyngeal Swab  Result Value Ref Range Status   SARS Coronavirus 2 by RT PCR NEGATIVE NEGATIVE Final    Comment: (NOTE) SARS-CoV-2 target nucleic acids are NOT  DETECTED. The SARS-CoV-2 RNA is generally detectable in upper respiratoy specimens during the acute phase of infection. The lowest concentration of SARS-CoV-2 viral copies this assay can detect is 131 copies/mL. A negative result does not preclude SARS-Cov-2 infection and should not be used as the sole basis for treatment or other patient management decisions. A negative result may occur with  improper specimen collection/handling, submission of specimen other than nasopharyngeal swab, presence of viral mutation(s) within the areas targeted by this assay, and inadequate number of viral copies (<131 copies/mL). A negative result must be combined with clinical observations, patient history, and epidemiological information. The expected result is Negative. Fact Sheet for Patients:  PinkCheek.be Fact Sheet for Healthcare Providers:  GravelBags.it This test is not yet ap proved or cleared by the [place] FDA and  has been authorized for detection and/or  diagnosis of SARS-CoV-2 by FDA under an Emergency Use Authorization (EUA). This EUA will remain  in effect (meaning this test can be used) for the duration of the COVID-19 declaration under Section 564(b)(1) of the Act, 21 U.S.C. section 360bbb-3(b)(1), unless the authorization is terminated or revoked sooner.    Influenza A by PCR NEGATIVE NEGATIVE Final   Influenza B by PCR NEGATIVE NEGATIVE Final    Comment: (NOTE) The Xpert Xpress SARS-CoV-2/FLU/RSV assay is intended as an aid in  the diagnosis of influenza from Nasopharyngeal swab specimens and  should not be used as a sole basis for treatment. Nasal washings and  aspirates are unacceptable for Xpert Xpress SARS-CoV-2/FLU/RSV  testing. Fact Sheet for Patients: PinkCheek.be Fact Sheet for Healthcare Providers: GravelBags.it This test is not yet approved or cleared  by the [place] FDA and  has been authorized for detection and/or diagnosis of SARS-CoV-2 by  FDA under an Emergency Use Authorization (EUA). This EUA will remain  in effect (meaning this test can be used) for the duration of the  Covid-19 declaration under Section 564(b)(1) of the Act, 21  U.S.C. section 360bbb-3(b)(1), unless the authorization is  terminated or revoked. Performed at [facility], [place] [address] [place], [place] [address]   Culture, blood (routine x 2)     Status: None (Preliminary result)   Collection Time: 06/22/19  9:28 PM   Specimen: BLOOD RIGHT FOREARM  Result Value Ref Range Status   Specimen Description BLOOD RIGHT FOREARM  Final   Special Requests   Final    BOTTLES DRAWN AEROBIC AND ANAEROBIC Blood Culture adequate volume   Culture   Final    NO GROWTH < 12 HOURS Performed at [facility], [place] [address] [place], [place] [address]    Report Status PENDING  Incomplete  Culture, blood (routine x 2)     Status: None (Preliminary result)   Collection Time: 06/22/19 10:00 PM   Specimen: BLOOD LEFT HAND  Result Value Ref Range Status   Specimen Description BLOOD LEFT HAND  Final   Special Requests   Final    BOTTLES DRAWN AEROBIC AND ANAEROBIC Blood Culture adequate volume   Culture   Final    NO GROWTH < 12 HOURS Performed at [facility], [place] [address], [place], [place] [address]    Report Status PENDING  Incomplete     Medications:   . amiodarone  200 mg Oral Daily  . aspirin EC  81 mg Oral Daily  . docusate sodium  100 mg Oral BID  . hydroxyurea  1,000 mg Oral Once per day on Mon Wed Fri  . hydroxyurea  500 mg Oral Once per day on Sun Tue Thu Sat  . levothyroxine  50 mcg Oral Q0600  . senna  1 tablet Oral BID   Continuous Infusions: . sodium chloride 10 mL/hr at 06/24/19 0359  . sodium chloride 75 mL/hr at 06/24/19 0359  . piperacillin-tazobactam (ZOSYN)  IV Stopped (06/24/19 0334)  . potassium chloride         LOS: 2 days   Charlynne Cousins  [facility]  06/24/2019, 7:13 AM

## 2019-06-24 NOTE — Progress Notes (Signed)
[facility] Progress Note     Subjective: CC-  Feeling a little better today. Epigastric pain is less. Denies n/v. Cardiology states patient is higher risk for perioperative complications based on underlying cardiac problems and other comorbidities but surgery is not prohibitive. INR 2.1  Objective: Vital signs in last 24 hours: Temp:  [97.9 F (36.6 C)-98.7 F (37.1 C)] 97.9 F (36.6 C) (01/26 0715) Pulse Rate:  [69-77] 69 (01/26 0758) Resp:  [16-21] 21 (01/26 0758) BP: (85-108)/(49-62) 99/53 (01/26 0715) SpO2:  [94 %-97 %] 97 % (01/26 0758) Weight:  [75.5 kg] 75.5 kg (01/26 0351) Last BM Date: 06/23/19  Intake/Output from previous day: 01/25 0701 - 01/26 0700 In: 2074.5 [I.V.:1874.5; IV Piggyback:200] Out: 725 [Urine:725] Intake/Output this shift: Total I/O In: -  Out: 325 [Urine:325]  PE: Gen:  Alert, NAD, pleasant HEENT: EOM's intact, pupils equal and round Card:  Mechanical click Pulm:  CTAB, no W/R/R, rate and effort normal Abd: Soft, ND, mild epigastric TTP without rebound or guarding, +BS, no HSM Skin: no rashes noted, warm and dry  Lab Results:  Recent Labs    06/22/19 1601 06/22/19 2216  WBC 8.2 7.3  HGB 13.4 12.3*  HCT 38.3* 34.5*  PLT 290 232   BMET Recent Labs    06/22/19 2216 06/24/19 0338  NA 135 136  K 3.7 3.2*  CL 104 105  CO2 19* 19*  GLUCOSE 95 67*  BUN 28* 36*  CREATININE 1.55* 1.59*  CALCIUM 8.4* 8.2*   PT/INR Recent Labs    06/23/19 0246 06/24/19 0338  LABPROT 26.4* 23.2*  INR 2.4* 2.1*   CMP     Component Value Date/Time   NA 136 06/24/2019 0338   K 3.2 (L) 06/24/2019 0338   CL 105 06/24/2019 0338   CO2 19 (L) 06/24/2019 0338   GLUCOSE 67 (L) 06/24/2019 0338   BUN 36 (H) 06/24/2019 0338   CREATININE 1.59 (H) 06/24/2019 0338   CALCIUM 8.2 (L) 06/24/2019 0338   PROT 5.9 (L) 06/22/2019 2216   ALBUMIN 2.8 (L) 06/22/2019 2216   AST 268 (H) 06/22/2019 2216   ALT 242 (H) 06/22/2019 2216   ALKPHOS 131  (H) 06/22/2019 2216   BILITOT 5.2 (H) 06/22/2019 2216   GFRNONAA 44 (L) 06/24/2019 0338   GFRAA 51 (L) 06/24/2019 0338   Lipase     Component Value Date/Time   LIPASE 14 06/22/2019 1601       Studies/Results: CT ABDOMEN PELVIS WO CONTRAST  Result Date: 06/22/2019 CLINICAL DATA:  Abdominal pain. EXAM: CT CHEST, ABDOMEN AND PELVIS WITHOUT CONTRAST TECHNIQUE: Multidetector CT imaging of the chest, abdomen and pelvis was performed following the standard protocol without IV contrast. COMPARISON:  None. FINDINGS: CT CHEST FINDINGS Cardiovascular: There is a dual lead AICD. There is marked severity calcification of the thoracic aorta without evidence of aneurysmal dilatation. An artificial aortic valve is noted. Normal heart size. No pericardial effusion. Marked severity coronary artery calcification is seen. Mediastinum/Nodes: Lungs/Pleura: Mild areas of atelectasis and/or infiltrate are seen within the left upper lobe and posterior aspect of the left lung base. There is no evidence of a pleural effusion or pneumothorax. Musculoskeletal: Multiple sternal wires are seen. Multilevel degenerative changes are noted throughout the thoracic spine. CT ABDOMEN PELVIS FINDINGS Hepatobiliary: No focal liver abnormality is seen. Numerous tiny gallstones are seen within the lumen of a partially contracted gallbladder. A very mild amount of pericholecystic inflammation is noted. Pancreas: Unremarkable. No pancreatic ductal dilatation or surrounding inflammatory  changes. Spleen: Normal in size without focal abnormality. Adrenals/Urinary Tract: Adrenal glands are unremarkable. Kidneys are normal in size, without renal calculi or hydronephrosis. Small areas of parenchymal calcification and adjacent focal scarring are seen within the lower pole the left kidney. A 2.5 cm x 1.6 cm adjacent area of low attenuation is seen within the posterior aspect of the lower pole on the left (axial CT image 37, CT series number 8).  Bladder is unremarkable. Stomach/Bowel: Stomach is within normal limits. The appendix is not identified. No evidence of bowel wall thickening, distention, or inflammatory changes. Vascular/Lymphatic: There is marked severity aortic atherosclerosis. 4.6 cm x 4.5 cm aneurysmal dilatation of the infrarenal abdominal aorta is seen. No enlarged abdominal or pelvic lymph nodes. Reproductive: The prostate gland is mildly enlarged. Other: No abdominal wall hernia or abnormality. No abdominopelvic ascites. Musculoskeletal: Multilevel degenerative changes are seen throughout the lumbar spine. IMPRESSION: 1. Cholelithiasis with additional findings suggestive of acute cholecystitis. 2. 4.6 cm x 4.5 cm infrarenal abdominal aortic aneurysm. 3. Evidence of prior median sternotomy. 4. Artificial aortic valve. 5. Focal scarring and calcification within the lower pole of the left kidney with an adjacent area of low attenuation which may represent a small renal cyst. Correlation with renal ultrasound is recommended. Electronically Signed   By: Virgina Norfolk M.D.   On: 06/22/2019 17:59   CT CHEST WO CONTRAST  Result Date: 06/22/2019 CLINICAL DATA:  Abdominal pain. EXAM: CT CHEST, ABDOMEN AND PELVIS WITHOUT CONTRAST TECHNIQUE: Multidetector CT imaging of the chest, abdomen and pelvis was performed following the standard protocol without IV contrast. COMPARISON:  None. FINDINGS: CT CHEST FINDINGS Cardiovascular: There is a dual lead AICD. There is marked severity calcification of the thoracic aorta without evidence of aneurysmal dilatation. An artificial aortic valve is noted. Normal heart size. No pericardial effusion. Marked severity coronary artery calcification is seen. Mediastinum/Nodes: Lungs/Pleura: Mild areas of atelectasis and/or infiltrate are seen within the left upper lobe and posterior aspect of the left lung base. There is no evidence of a pleural effusion or pneumothorax. Musculoskeletal: Multiple sternal wires are  seen. Multilevel degenerative changes are noted throughout the thoracic spine. CT ABDOMEN PELVIS FINDINGS Hepatobiliary: No focal liver abnormality is seen. Numerous tiny gallstones are seen within the lumen of a partially contracted gallbladder. A very mild amount of pericholecystic inflammation is noted. Pancreas: Unremarkable. No pancreatic ductal dilatation or surrounding inflammatory changes. Spleen: Normal in size without focal abnormality. Adrenals/Urinary Tract: Adrenal glands are unremarkable. Kidneys are normal in size, without renal calculi or hydronephrosis. Small areas of parenchymal calcification and adjacent focal scarring are seen within the lower pole the left kidney. A 2.5 cm x 1.6 cm adjacent area of low attenuation is seen within the posterior aspect of the lower pole on the left (axial CT image 37, CT series number 8). Bladder is unremarkable. Stomach/Bowel: Stomach is within normal limits. The appendix is not identified. No evidence of bowel wall thickening, distention, or inflammatory changes. Vascular/Lymphatic: There is marked severity aortic atherosclerosis. 4.6 cm x 4.5 cm aneurysmal dilatation of the infrarenal abdominal aorta is seen. No enlarged abdominal or pelvic lymph nodes. Reproductive: The prostate gland is mildly enlarged. Other: No abdominal wall hernia or abnormality. No abdominopelvic ascites. Musculoskeletal: Multilevel degenerative changes are seen throughout the lumbar spine. IMPRESSION: 1. Cholelithiasis with additional findings suggestive of acute cholecystitis. 2. 4.6 cm x 4.5 cm infrarenal abdominal aortic aneurysm. 3. Evidence of prior median sternotomy. 4. Artificial aortic valve. 5. Focal scarring and calcification within  the lower pole of the left kidney with an adjacent area of low attenuation which may represent a small renal cyst. Correlation with renal ultrasound is recommended. Electronically Signed   By: Virgina Norfolk M.D.   On: 06/22/2019 17:59   US  Abdomen Limited RUQ  Result Date: 06/22/2019 CLINICAL DATA:  Biliary disease. EXAM: ULTRASOUND ABDOMEN LIMITED RIGHT UPPER QUADRANT COMPARISON:  CT abdomen/pelvis 06/22/2019 FINDINGS: Gallbladder: Cholecystolithiasis. Visualized gallstones measure up to 6 mm. Thick-walled gallbladder measuring 11 mm. Pericholecystic fluid. A sonographic Murphy sign was elicited by the scanning technologist. Common bile duct: Diameter: 4 mm proximally, 5-6 mm distally. Liver: No focal lesion identified. Increased hepatic parenchymal echogenicity. Interrogated portal vein is patent on color Doppler imaging with normal direction of blood flow towards the liver. IMPRESSION: Cholecystolithiasis with gallbladder wall thickening and pericholecystic fluid. A sonographic Murphy sign was elicited by the scanning technologist. This constellation of findings is highly suggestive of acute cholecystitis. Increased hepatic parenchymal echogenicity. While this is a nonspecific finding, increased density of the liver was demonstrated on CT performed earlier the same day. Query changes related to amiodarone therapy in this patient with a history of chronic heart failure. Electronically Signed   By: Kellie Simmering DO   On: 06/22/2019 21:40    Anti-infectives: Anti-infectives (From admission, onward)   Start     Dose/Rate Route Frequency Ordered Stop   06/22/19 2200  piperacillin-tazobactam (ZOSYN) IVPB 3.375 g     3.375 g 12.5 mL/hr over 240 Minutes Intravenous Every 8 hours 06/22/19 2014     06/22/19 1830  piperacillin-tazobactam (ZOSYN) IVPB 3.375 g     3.375 g 100 mL/hr over 30 Minutes Intravenous  Once 06/22/19 1815 06/22/19 1916       Assessment/Plan CAD s/p CABG Systolic heart failure with EF 25%  VT arrest in 2017 S/p aortic mechanical valve replacement on Coumadin AAA HTN  Acute cholecystitis - Patient is high risk for perioperative complication per cardiology. Will ask IR to place percutaneous cholecystostomy tube.  Continue IV zosyn. Keep NPO until evaluated by IR. INR 2.1 may need to be reversed or allow to down trend prior to procedure, but will leave this to primary/IR.  ID - zosyn 1/24>> FEN - IVF, NPO VTE - coumadin/heparin Foley - none Follow up - TBD   LOS: 2 days    Wellington Hampshire, [facility] 06/24/2019, 9:12 AM Please see Amion for pager number during day hours 7:00am-4:30pm

## 2019-06-24 NOTE — Consult Note (Signed)
Chief Complaint: Patient was seen in consultation today for percutaneous cholecystostomy drain placement Chief Complaint  Patient presents with   Abdominal Pain   at the request of Dr Dennis Bast   Supervising Physician: Jacqulynn Cadet  Patient Status: [facility] - In-pt  History of Present Illness: Taylor [name] is a 69 y.o. male   Hx mechanical Heart valve-- LD coumadin 1/23  N/V; abd pain RUQ pain since 1/23 To ED 1/24 [place]: IMPRESSION: Cholecystolithiasis with gallbladder wall thickening and pericholecystic fluid. A sonographic Murphy sign was elicited by the scanning technologist. This constellation of findings is highly suggestive of acute cholecystitis.  CT: IMPRESSION: 1. Cholelithiasis with additional findings suggestive of acute cholecystitis. 2. 4.6 cm x 4.5 cm infrarenal abdominal aortic aneurysm. 3. Evidence of prior median sternotomy. 4. Artificial aortic valve. 5. Focal scarring and calcification within the lower pole of the left kidney with an adjacent area of low attenuation which may represent a small renal cyst. Correlation with renal ultrasound is recommended.  INR today 2.1 CCS requesting percutaneous chole drain Hx CHF makes him not good candidate for surgery per note  Dr Laurence Ferrari has reviewed imaging and approves procedure Will recheck INR in am    Past Medical History:  Diagnosis Date   Atherosclerotic heart disease of native coronary artery without angina pectoris    Chronic anticoagulation [id number]   Chronic systolic heart failure ([place]) 02/02/2017   Essential thrombocythemia ([place]) 02/15/2017   Hyperlipidemia    Hypertension    Ischemic cardiomyopathy    Leg pain, central, left 04/17/2017   Hx sciatica - pain similar; no red flag signs on hx or exam    Presence of other heart-valve replacement    Ventricular tachycardia (H[facility])     Past Surgical History:  Procedure Laterality Date   BIV ICD INSERTION CRT-D     CORONARY  ARTERY BYPASS GRAFT  2017   MECHANICAL AORTIC VALVE REPLACEMENT      Allergies: Erythromycin, Atorvastatin calcium, Rosuvastatin calcium, Simvastatin, and Zithromax [azithromycin]  Medications: Prior to Admission medications   Medication Sig Start Date End Date Taking? Authorizing Provider  amiodarone (PACERONE) 200 MG tablet TAKE 1 TABLET(200 MG) BY MOUTH DAILY Patient taking differently: Take 200 mg by mouth daily.  12/26/18  Yes Bensimhon, Shaune Pascal, MD  aspirin 81 MG tablet Take 1 tablet by mouth.   Yes [provider]  atorvastatin (LIPITOR) 40 MG tablet Take 1 tablet (40 mg total) by mouth at bedtime. Patient taking differently: Take 40 mg by mouth 2 (two) times daily.  03/21/19 06/22/19 Yes Simmons, Brittainy M, PA-C  carvedilol (COREG) 6.25 MG tablet Take 1 tablet (6.25 mg total) by mouth 2 (two) times daily with a meal. 02/27/19  Yes Bensimhon, Shaune Pascal, MD  dapagliflozin propanediol (FARXIGA) 10 MG TABS tablet Take 10 mg by mouth daily.   Yes [provider]  diphenhydrAMINE (BENADRYL) 25 MG tablet Take 25 mg by mouth every 6 (six) hours as needed for sleep.    Yes [provider]  hydroxyurea (HYDREA) 500 MG capsule TAKE 1 CAPSULE BY MOUTH ALTERNATING WITH 2 CAPSULES DAILY(500 MG ONE DAY AND 1000 MG NEXT DAY). Patient taking differently: Take 500 mg by mouth See admin instructions. TAKE 1 CAPSULE BY MOUTH ALTERNATING WITH 2 CAPSULES DAILY(500 MG ONE DAY AND 1000 MG NEXT DAY). 06/11/19  Yes Bensimhon, Shaune Pascal, MD  levothyroxine (SYNTHROID, LEVOTHROID) 25 MCG tablet Take 2 tablets (50 mcg total) by mouth daily. Additional refills should come from PCP  06/05/18  Yes Bensimhon, Shaune Pascal, MD  sacubitril-valsartan (ENTRESTO) 24-26 MG Take 1 tablet by mouth daily. 05/19/19  Yes Bensimhon, Shaune Pascal, MD  spironolactone (ALDACTONE) 25 MG tablet TAKE 1/2 TABLET(12.5 MG) BY MOUTH DAILY Patient taking differently: Take 12.5 mg by mouth daily.  10/14/18  Yes Bensimhon, Shaune Pascal, MD  warfarin (COUMADIN) 5 MG tablet Take as directed by coumadin clinic Patient taking differently: Take 2.5 mg by mouth every evening. Take as directed by coumadin clinic 02/17/19  Yes Bensimhon, Shaune Pascal, MD     Family History  Problem Relation Age of Onset   Hypertension Father     Social History   Socioeconomic History   Marital status: Married    Spouse name: Not on file   Number of children: Not on file   Years of education: Not on file   Highest education level: Not on file  Occupational History   Not on file  Tobacco Use   Smoking status: Former Smoker    Packs/day: 1.00    Years: 30.00    Pack years: 30.00    Types: Cigarettes    Start date: 05/26/1979    Quit date: 05/18/2016    Years since quitting: 3.1   Smokeless tobacco: Never Used  Substance and Sexual Activity   Alcohol use: Yes    Alcohol/week: 14.0 standard drinks    Types: 14 Cans of beer per week    Comment: 2 cans a day..   Drug use: No   Sexual activity: Not on file  Other Topics Concern   Not on file  Social History Narrative   Not on file   Social Determinants of Health   Financial Resource Strain:    Difficulty of Paying Living Expenses: Not on file  Food Insecurity:    Worried About [place] in the Last Year: Not on file   Ran Out of Food in the Last Year: Not on file  Transportation Needs:    Lack of Transportation (Medical): Not on file   Lack of Transportation (Non-Medical): Not on file  Physical Activity:    Days of Exercise per Week: Not on file   Minutes of Exercise per Session: Not on file  Stress:    Feeling of Stress : Not on file  Social Connections:    Frequency of Communication with Friends and Family: Not on file   Frequency of Social Gatherings with Friends and Family: Not on file   Attends Religious Services: Not on file   Active Member of Clubs or Organizations: Not on file   Attends Archivist Meetings: Not on file    Marital Status: Not on file    Review of Systems: A 12 point ROS discussed and pertinent positives are indicated in the HPI above.  All other systems are negative.  Review of Systems  Constitutional: Positive for activity change and appetite change. Negative for fever and unexpected weight change.  Respiratory: Negative for cough and shortness of breath.   Cardiovascular: Negative for chest pain.  Gastrointestinal: Positive for abdominal pain, nausea and vomiting.  Psychiatric/Behavioral: Negative for behavioral problems and confusion.    Vital Signs: BP (!) 99/53 (BP Location: Right Arm)    Pulse 69    Temp 97.9 F (36.6 C) (Oral)    Resp (!) 21    Ht 5\' 9"  (1.753 m)    Wt 166 lb 8 oz (75.5 kg)    SpO2 97%    BMI 24.59 kg/m  Physical Exam Vitals reviewed.  Cardiovascular:     Rate and Rhythm: Normal rate and regular rhythm.  Pulmonary:     Effort: Pulmonary effort is normal.     Breath sounds: Normal breath sounds.  Abdominal:     Tenderness: There is abdominal tenderness in the right upper quadrant.  Skin:    General: Skin is warm and dry.  Neurological:     Mental Status: He is alert and oriented to person, place, and time.  Psychiatric:        Behavior: Behavior normal.     Imaging: CT ABDOMEN PELVIS WO CONTRAST  Result Date: 06/22/2019 CLINICAL DATA:  Abdominal pain. EXAM: CT CHEST, ABDOMEN AND PELVIS WITHOUT CONTRAST TECHNIQUE: Multidetector CT imaging of the chest, abdomen and pelvis was performed following the standard protocol without IV contrast. COMPARISON:  None. FINDINGS: CT CHEST FINDINGS Cardiovascular: There is a dual lead AICD. There is marked severity calcification of the thoracic aorta without evidence of aneurysmal dilatation. An artificial aortic valve is noted. Normal heart size. No pericardial effusion. Marked severity coronary artery calcification is seen. Mediastinum/Nodes: Lungs/Pleura: Mild areas of atelectasis and/or infiltrate are seen within  the left upper lobe and posterior aspect of the left lung base. There is no evidence of a pleural effusion or pneumothorax. Musculoskeletal: Multiple sternal wires are seen. Multilevel degenerative changes are noted throughout the thoracic spine. CT ABDOMEN PELVIS FINDINGS Hepatobiliary: No focal liver abnormality is seen. Numerous tiny gallstones are seen within the lumen of a partially contracted gallbladder. A very mild amount of pericholecystic inflammation is noted. Pancreas: Unremarkable. No pancreatic ductal dilatation or surrounding inflammatory changes. Spleen: Normal in size without focal abnormality. Adrenals/Urinary Tract: Adrenal glands are unremarkable. Kidneys are normal in size, without renal calculi or hydronephrosis. Small areas of parenchymal calcification and adjacent focal scarring are seen within the lower pole the left kidney. A 2.5 cm x 1.6 cm adjacent area of low attenuation is seen within the posterior aspect of the lower pole on the left (axial CT image 37, CT series number 8). Bladder is unremarkable. Stomach/Bowel: Stomach is within normal limits. The appendix is not identified. No evidence of bowel wall thickening, distention, or inflammatory changes. Vascular/Lymphatic: There is marked severity aortic atherosclerosis. 4.6 cm x 4.5 cm aneurysmal dilatation of the infrarenal abdominal aorta is seen. No enlarged abdominal or pelvic lymph nodes. Reproductive: The prostate gland is mildly enlarged. Other: No abdominal wall hernia or abnormality. No abdominopelvic ascites. Musculoskeletal: Multilevel degenerative changes are seen throughout the lumbar spine. IMPRESSION: 1. Cholelithiasis with additional findings suggestive of acute cholecystitis. 2. 4.6 cm x 4.5 cm infrarenal abdominal aortic aneurysm. 3. Evidence of prior median sternotomy. 4. Artificial aortic valve. 5. Focal scarring and calcification within the lower pole of the left kidney with an adjacent area of low attenuation which  may represent a small renal cyst. Correlation with renal ultrasound is recommended. Electronically Signed   By: Virgina Norfolk M.D.   On: 06/22/2019 17:59   CT CHEST WO CONTRAST  Result Date: 06/22/2019 CLINICAL DATA:  Abdominal pain. EXAM: CT CHEST, ABDOMEN AND PELVIS WITHOUT CONTRAST TECHNIQUE: Multidetector CT imaging of the chest, abdomen and pelvis was performed following the standard protocol without IV contrast. COMPARISON:  None. FINDINGS: CT CHEST FINDINGS Cardiovascular: There is a dual lead AICD. There is marked severity calcification of the thoracic aorta without evidence of aneurysmal dilatation. An artificial aortic valve is noted. Normal heart size. No pericardial effusion. Marked severity coronary artery calcification is seen. Mediastinum/Nodes:  Lungs/Pleura: Mild areas of atelectasis and/or infiltrate are seen within the left upper lobe and posterior aspect of the left lung base. There is no evidence of a pleural effusion or pneumothorax. Musculoskeletal: Multiple sternal wires are seen. Multilevel degenerative changes are noted throughout the thoracic spine. CT ABDOMEN PELVIS FINDINGS Hepatobiliary: No focal liver abnormality is seen. Numerous tiny gallstones are seen within the lumen of a partially contracted gallbladder. A very mild amount of pericholecystic inflammation is noted. Pancreas: Unremarkable. No pancreatic ductal dilatation or surrounding inflammatory changes. Spleen: Normal in size without focal abnormality. Adrenals/Urinary Tract: Adrenal glands are unremarkable. Kidneys are normal in size, without renal calculi or hydronephrosis. Small areas of parenchymal calcification and adjacent focal scarring are seen within the lower pole the left kidney. A 2.5 cm x 1.6 cm adjacent area of low attenuation is seen within the posterior aspect of the lower pole on the left (axial CT image 37, CT series number 8). Bladder is unremarkable. Stomach/Bowel: Stomach is within normal limits. The  appendix is not identified. No evidence of bowel wall thickening, distention, or inflammatory changes. Vascular/Lymphatic: There is marked severity aortic atherosclerosis. 4.6 cm x 4.5 cm aneurysmal dilatation of the infrarenal abdominal aorta is seen. No enlarged abdominal or pelvic lymph nodes. Reproductive: The prostate gland is mildly enlarged. Other: No abdominal wall hernia or abnormality. No abdominopelvic ascites. Musculoskeletal: Multilevel degenerative changes are seen throughout the lumbar spine. IMPRESSION: 1. Cholelithiasis with additional findings suggestive of acute cholecystitis. 2. 4.6 cm x 4.5 cm infrarenal abdominal aortic aneurysm. 3. Evidence of prior median sternotomy. 4. Artificial aortic valve. 5. Focal scarring and calcification within the lower pole of the left kidney with an adjacent area of low attenuation which may represent a small renal cyst. Correlation with renal ultrasound is recommended. Electronically Signed   By: Virgina Norfolk M.D.   On: 06/22/2019 17:59   US Abdomen Limited RUQ  Result Date: 06/22/2019 CLINICAL DATA:  Biliary disease. EXAM: ULTRASOUND ABDOMEN LIMITED RIGHT UPPER QUADRANT COMPARISON:  CT abdomen/pelvis 06/22/2019 FINDINGS: Gallbladder: Cholecystolithiasis. Visualized gallstones measure up to 6 mm. Thick-walled gallbladder measuring 11 mm. Pericholecystic fluid. A sonographic Murphy sign was elicited by the scanning technologist. Common bile duct: Diameter: 4 mm proximally, 5-6 mm distally. Liver: No focal lesion identified. Increased hepatic parenchymal echogenicity. Interrogated portal vein is patent on color Doppler imaging with normal direction of blood flow towards the liver. IMPRESSION: Cholecystolithiasis with gallbladder wall thickening and pericholecystic fluid. A sonographic Murphy sign was elicited by the scanning technologist. This constellation of findings is highly suggestive of acute cholecystitis. Increased hepatic parenchymal echogenicity.  While this is a nonspecific finding, increased density of the liver was demonstrated on CT performed earlier the same day. Query changes related to amiodarone therapy in this patient with a history of chronic heart failure. Electronically Signed   By: Kellie Simmering DO   On: 06/22/2019 21:40    Labs:  CBC: Recent Labs    06/22/19 1601 06/22/19 2216  WBC 8.2 7.3  HGB 13.4 12.3*  HCT 38.3* 34.5*  PLT 290 232    COAGS: Recent Labs    06/09/19 1148 06/22/19 1601 06/23/19 0246 06/24/19 0338  INR 2.2 2.4* 2.4* 2.1*  APTT  --  39*  --   --     BMP: Recent Labs    06/22/19 1601 06/22/19 2216 06/24/19 0338  NA 133* 135 136  K 3.8 3.7 3.2*  CL 101 104 105  CO2 19* 19* 19*  GLUCOSE 135* 95  67*  BUN 26* 28* 36*  CALCIUM 8.9 8.4* 8.2*  CREATININE 1.70* 1.55* 1.59*  GFRNONAA 40* 45* 44*  GFRAA 47* 52* 51*    LIVER FUNCTION TESTS: Recent Labs    06/22/19 1601 06/22/19 2216 06/24/19 0338  BILITOT 4.9* 5.2* 2.1*  AST 295* 268* 179*  ALT 257* 242* 207*  ALKPHOS 146* 131* 96  PROT 6.5 5.9* 5.4*  ALBUMIN 3.2* 2.8* 2.4*    TUMOR MARKERS: No results for input(s): AFPTM, CEA, CA199, CHROMGRNA in the last 8760 hours.  Assessment and Plan:  Acute cholecystitis CCS recommending percutaneous cholecystostomy drain scheduled now for same -- plan for 1/27 in IR Will recheck INR in am Risks and benefits discussed with the patient including, but not limited to bleeding, infection, gallbladder perforation, bile leak, sepsis or even death.  All of the patient's questions were answered, patient is agreeable to proceed. Consent signed and in chart.   Thank you for this interesting consult.  I greatly enjoyed meeting [facility] and look forward to participating in their care.  A copy of this report was sent to the requesting provider on this date.  Electronically Signed: Lavonia Drafts, PA-C 06/24/2019, 11:02 AM   I spent a total of 40 Minutes    in face to face in  clinical consultation, greater than 50% of which was counseling/coordinating care for perc chole drain

## 2019-06-25 ENCOUNTER — Inpatient Hospital Stay (HOSPITAL_COMMUNITY): Payer: Medicare Other

## 2019-06-25 LAB — COMPREHENSIVE METABOLIC PANEL
ALT: 133 U/L — ABNORMAL HIGH (ref 0–44)
AST: 73 U/L — ABNORMAL HIGH (ref 15–41)
Albumin: 2.2 g/dL — ABNORMAL LOW (ref 3.5–5.0)
Alkaline Phosphatase: 74 U/L (ref 38–126)
Anion gap: 8 (ref 5–15)
BUN: 21 mg/dL (ref 8–23)
CO2: 22 mmol/L (ref 22–32)
Calcium: 8.2 mg/dL — ABNORMAL LOW (ref 8.9–10.3)
Chloride: 104 mmol/L (ref 98–111)
Creatinine, Ser: 1.17 mg/dL (ref 0.61–1.24)
GFR calc Af Amer: >60 mL/min (ref >60)
GFR calc non Af Amer: >60 mL/min (ref >60)
Glucose, Bld: 100 mg/dL — ABNORMAL HIGH (ref 70–99)
Potassium: 3.2 mmol/L — ABNORMAL LOW (ref 3.5–5.1)
Sodium: 134 mmol/L — ABNORMAL LOW (ref 135–145)
Total Bilirubin: 1.3 mg/dL — ABNORMAL HIGH (ref 0.3–1.2)
Total Protein: 5.3 g/dL — ABNORMAL LOW (ref 6.5–8.1)

## 2019-06-25 LAB — PROTIME-INR
INR: 2.4 — ABNORMAL HIGH (ref 0.8–1.2)
Prothrombin Time: 26 seconds — ABNORMAL HIGH (ref 11.4–15.2)

## 2019-06-25 LAB — MAGNESIUM: Magnesium: 2.2 mg/dL (ref 1.7–2.4)

## 2019-06-25 MED ORDER — POTASSIUM CHLORIDE CRYS ER 20 MEQ PO TBCR
60.0000 meq | EXTENDED_RELEASE_TABLET | Freq: Once | ORAL | Status: AC
  Administered 2019-06-25: 60 meq via ORAL
  Filled 2019-06-25: qty 3

## 2019-06-25 MED ORDER — TECHNETIUM TC 99M MEBROFENIN IV KIT
5.1000 | PACK | Freq: Once | INTRAVENOUS | Status: AC | PRN
  Administered 2019-06-25: 13:00:00 5.1 via INTRAVENOUS

## 2019-06-25 NOTE — Progress Notes (Addendum)
[facility] PROGRESS NOTE    Progress Note  [name]  [id number] DOB: July 29, 1950 DOA: 06/22/2019 PCP: Michell Heinrich, DO     Brief Narrative:   Taylor [name] is an 69 y.o. male past medical history significant for CAD status post CABG, systolic heart failure with  EF 25% status post CRT-D, VT arrest in 2017, status post aortic mechanical valve replacement on Coumadin, AAA, essential hypertension who presents to the ED with 1 day of fever chills abdominal pain nausea and vomiting.  Patient appears jaundice, with a light-colored stools and dark urine in the ED was found to be in acute renal failure with elevated LFTs and INR of 2.1, CT scan of the abdomen and pelvis showed acute cholecystitis and a nonrupture 4.6 x 4.5 cm infrarenal AAA.  On Zosyn, Abdominal ultrasound on 06/22/2019 show gallbladder wall thickening with choledocholithiasis pericholecystic fluid and Murphy's sign elicited by technologist. IR and General Surgery consulted.   Assessment/Plan:   Severe sepsis secondary to acute cholecystitis: Continue IV Zosyn. LFT's continue to down-trend. Surgery recommended percutaneous drainage cardiology was consulted; clearance for surgery given and patient has been NPO and awaiting INR to be ~ 1.5 for percutaneous cholecystostomy.  Discussed with IR today: Dr. Reesa Chew and Sherlie Ban. Patient is overall clinically improving and LFT's improving. INR still 2.4 today. Will obtain HIDA scan and further decide if patient needs drain placement. If not drain placement needed; will plan to restart PTA Warfarin. If drain is still needed, will plan for INR reversal with FFP or Vitamin K and plan for IR drain placement tomorrow. NPO until HIDA done. Will place NPO at midnight as needed but can otherwise have diet after HIDA.   Acute kidney injury (resolved) Likely prerenal in the setting of antihypertensive medication, Coreg Entresto and Aldactone were held. Baseline creatinine around 1.1. Cr  1.17 on 1/27  Preop evaluation: Cards consulted and he is moderate risk for perioperative cardiovascular complications; possible drain placement as above pending HIDA scan.  Start Heparin if INR < 2.0. If no drain placement, can restart Warfarin.  Aortic mechanical valve on Coumadin: Holding Coumadin in anticipation of operative management. INR today is 2.4; bridge with heparin once INR less than 2.0.  Essential hypertension/ischemic cardiomyopathy with an EF of 25%/hypotension: Continue to hold antihypertensive medication Coreg, Entresto and Aldactone. Blood pressure stable BP Readings from Last 1 Encounters:  06/25/19 115/61   Essential thrombocytosis: Continue aspirin and hydroxyurea.  Hypothyroidism: Continue Synthroid.  Mild hyponatremia (resolved) likely hypovolemic, resolved with IV fluid hydration.  Normal anion gap metabolic acidosis (resolved) Likely due to acute renal failure, resolved with IV fluid hydration.  Normocytic anemia Follow-up with PCP as an outpatient.   DVT prophylaxis: hold PTA Warfarin and bridge with Heparin if INR < 2.0; may also be restarting Warfarin soon if IR decides to forego drain placement Family Communication: none Disposition Plan/Barrier to D/C: Ambulating independently; plan for DC home when stable  Code Status:     Code Status Orders  (From admission, onward)         Start     Ordered   06/22/19 2038  Full code  Continuous     06/22/19 2038        Code Status History    This patient has a current code status but no historical code status.   Advance Care Planning Activity        IV Access:    Peripheral IV   Procedures and diagnostic studies:   No results  found.   Medical Consultants:    None.  Anti-Infectives:   IV Zosyn  Subjective:    Taylor [name] reports feeling well today. Updated on plan of care. Reports no abdominal pain. Reports he walked in the halls yesterday first with walker and then  on his own. Denies fever, chills, cough, SOB, chest pain, abdominal pain, nausea, vomiting, diarrhea, constipation, dysuria, hematuria, hematochezia, melena, difficulty moving arms/legs, speech difficulty, confusion or any other complaints.  Objective:    Vitals:   06/24/19 2100 06/25/19 0030 06/25/19 0423 06/25/19 0744  BP: (!) 105/55 (!) 104/58 (!) 107/58 115/61  Pulse: 73 71 68 67  Resp:   17 (!) 22  Temp: 98.6 F (37 C) 99.5 F (37.5 C) 98.5 F (36.9 C) 98.4 F (36.9 C)  TempSrc: Oral Oral Oral Oral  SpO2: 96% 96% 97% 98%  Weight:   75.8 kg   Height:       SpO2: 98 %   Intake/Output Summary (Last 24 hours) at 06/25/2019 1054 Last data filed at 06/24/2019 2100 Gross per 24 hour  Intake 310 ml  Output 725 ml  Net -415 ml   Filed Weights   06/23/19 0447 06/24/19 0351 06/25/19 0423  Weight: 75.4 kg 75.5 kg 75.8 kg    Exam: General exam: In no acute distress. AAOx4 Respiratory system: Good air movement and clear to auscultation. Cardiovascular system: S1 & S2 heard, RRR. No JVD. Gastrointestinal system: Abdomen is nondistended, soft and nontender. No RUQ tenderness. Extremities: No pedal edema. Skin: No rashes, lesions or ulcers Psychiatry: Judgement and insight appear normal. Mood & affect appropriate.   Data Reviewed:    Labs: Basic Metabolic Panel: Recent Labs  Lab 06/22/19 1601 06/22/19 1601 06/22/19 2038 06/22/19 2216 06/22/19 2216 06/24/19 0338 06/25/19 0647  NA 133*  --   --  135  --  136 134*  K 3.8   < >  --  3.7   < > 3.2* 3.2*  CL 101  --   --  104  --  105 104  CO2 19*  --   --  19*  --  19* 22  GLUCOSE 135*  --   --  95  --  67* 100*  BUN 26*  --   --  28*  --  36* 21  CREATININE 1.70*  --   --  1.55*  --  1.59* 1.17  CALCIUM 8.9  --   --  8.4*  --  8.2* 8.2*  MG  --   --  1.6*  --   --   --   --   PHOS  --   --  4.9*  --   --   --   --    < > = values in this interval not displayed.   GFR Estimated Creatinine Clearance: 59.6 mL/min  (by C-G formula based on SCr of 1.17 mg/dL). Liver Function Tests: Recent Labs  Lab 06/22/19 1601 06/22/19 2216 06/24/19 0338 06/25/19 0647  AST 295* 268* 179* 73*  ALT 257* 242* 207* 133*  ALKPHOS 146* 131* 96 74  BILITOT 4.9* 5.2* 2.1* 1.3*  PROT 6.5 5.9* 5.4* 5.3*  ALBUMIN 3.2* 2.8* 2.4* 2.2*   Recent Labs  Lab 06/22/19 1601  LIPASE 14   No results for input(s): AMMONIA in the last 168 hours. Coagulation profile Recent Labs  Lab 06/22/19 1601 06/23/19 0246 06/24/19 0338 06/24/19 2001 06/25/19 0647  INR 2.4* 2.4* 2.1* 2.5* 2.4*   COVID-19 Labs  No results for input(s): DDIMER, FERRITIN, LDH, CRP in the last 72 hours.  Lab Results  Component Value Date   [place] NEGATIVE 06/22/2019    CBC: Recent Labs  Lab 06/22/19 1601 06/22/19 2216  WBC 8.2 7.3  HGB 13.4 12.3*  HCT 38.3* 34.5*  MCV 123.9* 123.2*  PLT 290 232   Cardiac Enzymes: No results for input(s): CKTOTAL, CKMB, CKMBINDEX, TROPONINI in the last 168 hours. BNP (last 3 results) No results for input(s): PROBNP in the last 8760 hours. CBG: Recent Labs  Lab 06/24/19 0629 06/24/19 0649  GLUCAP 60* 131*   D-Dimer: No results for input(s): DDIMER in the last 72 hours. Hgb A1c: No results for input(s): HGBA1C in the last 72 hours. Lipid Profile: No results for input(s): CHOL, HDL, LDLCALC, TRIG, CHOLHDL, LDLDIRECT in the last 72 hours. Thyroid function studies: No results for input(s): TSH, T4TOTAL, T3FREE, THYROIDAB in the last 72 hours.  Invalid input(s): FREET3 Anemia work up: No results for input(s): VITAMINB12, FOLATE, FERRITIN, TIBC, IRON, RETICCTPCT in the last 72 hours. Sepsis Labs: Recent Labs  Lab 06/22/19 1601 06/22/19 2038 06/22/19 2216  WBC 8.2  --  7.3  LATICACIDVEN  --  1.3  --    Microbiology Recent Results (from the past 240 hour(s))  Respiratory Panel by RT PCR (Flu A&B, Covid) - Nasopharyngeal Swab     Status: None   Collection Time: 06/22/19  6:57 PM    Specimen: Nasopharyngeal Swab  Result Value Ref Range Status   SARS Coronavirus 2 by RT PCR NEGATIVE NEGATIVE Final    Comment: (NOTE) SARS-CoV-2 target nucleic acids are NOT DETECTED. The SARS-CoV-2 RNA is generally detectable in upper respiratoy specimens during the acute phase of infection. The lowest concentration of SARS-CoV-2 viral copies this assay can detect is 131 copies/mL. A negative result does not preclude SARS-Cov-2 infection and should not be used as the sole basis for treatment or other patient management decisions. A negative result may occur with  improper specimen collection/handling, submission of specimen other than nasopharyngeal swab, presence of viral mutation(s) within the areas targeted by this assay, and inadequate number of viral copies (<131 copies/mL). A negative result must be combined with clinical observations, patient history, and epidemiological information. The expected result is Negative. Fact Sheet for Patients:  PinkCheek.be Fact Sheet for Healthcare Providers:  GravelBags.it This test is not yet ap proved or cleared by the [place] FDA and  has been authorized for detection and/or diagnosis of SARS-CoV-2 by FDA under an Emergency Use Authorization (EUA). This EUA will remain  in effect (meaning this test can be used) for the duration of the COVID-19 declaration under Section 564(b)(1) of the Act, 21 U.S.C. section 360bbb-3(b)(1), unless the authorization is terminated or revoked sooner.    Influenza A by PCR NEGATIVE NEGATIVE Final   Influenza B by PCR NEGATIVE NEGATIVE Final    Comment: (NOTE) The Xpert Xpress SARS-CoV-2/FLU/RSV assay is intended as an aid in  the diagnosis of influenza from Nasopharyngeal swab specimens and  should not be used as a sole basis for treatment. Nasal washings and  aspirates are unacceptable for Xpert Xpress SARS-CoV-2/FLU/RSV  testing. Fact Sheet  for Patients: PinkCheek.be Fact Sheet for Healthcare Providers: GravelBags.it This test is not yet approved or cleared by the [place] FDA and  has been authorized for detection and/or diagnosis of SARS-CoV-2 by  FDA under an Emergency Use Authorization (EUA). This EUA will remain  in effect (meaning this test can be used)  for the duration of the  Covid-19 declaration under Section 564(b)(1) of the Act, 21  U.S.C. section 360bbb-3(b)(1), unless the authorization is  terminated or revoked. Performed at [facility], [place] [address], [place], [place] [address]   Culture, blood (routine x 2)     Status: None (Preliminary result)   Collection Time: 06/22/19  9:28 PM   Specimen: BLOOD RIGHT FOREARM  Result Value Ref Range Status   Specimen Description BLOOD RIGHT FOREARM  Final   Special Requests   Final    BOTTLES DRAWN AEROBIC AND ANAEROBIC Blood Culture adequate volume   Culture   Final    NO GROWTH 2 DAYS Performed at [facility], [place] [address], [place], [place] [address]    Report Status PENDING  Incomplete  Culture, blood (routine x 2)     Status: None (Preliminary result)   Collection Time: 06/22/19 10:00 PM   Specimen: BLOOD LEFT HAND  Result Value Ref Range Status   Specimen Description BLOOD LEFT HAND  Final   Special Requests   Final    BOTTLES DRAWN AEROBIC AND ANAEROBIC Blood Culture adequate volume   Culture   Final    NO GROWTH 2 DAYS Performed at [facility], [place] [address] [place], [place] [address]    Report Status PENDING  Incomplete     Medications:   . amiodarone  200 mg Oral Daily  . aspirin EC  81 mg Oral Daily  . docusate sodium  100 mg Oral BID  . hydroxyurea  1,000 mg Oral Once per day on Mon Wed Fri  . hydroxyurea  500 mg Oral Once per day on Sun Tue Thu Sat  . levothyroxine  50 mcg Oral Q0600  . potassium chloride  60 mEq Oral Once  . senna  1 tablet Oral BID  .  sodium chloride flush  10-40 mL Intracatheter Q12H   Continuous Infusions: . sodium chloride 10 mL/hr at 06/24/19 0359  . piperacillin-tazobactam (ZOSYN)  IV 3.375 g (06/25/19 1016)      LOS: 3 days   Chauncey Mann, MD  [facility]  06/25/2019, 10:54 AM

## 2019-06-25 NOTE — Progress Notes (Addendum)
ANTICOAGULATION CONSULT NOTE - Initial Consult  Pharmacy Consult for heparin Indication: mech AVR   Patient Measurements: Height: 5\' 9"  (175.3 cm) Weight: 167 lb 1.6 oz (75.8 kg) IBW/kg (Calculated) : 70.7 Heparin Dosing Weight: 75.4 kg  Vital Signs: Temp: 98.4 F (36.9 C) (01/27 0744) Temp Source: Oral (01/27 0744) BP: 115/61 (01/27 0744) Pulse Rate: 67 (01/27 0744)  Labs: Recent Labs    06/22/19 1601 06/22/19 1601 06/22/19 2216 06/23/19 0246 06/24/19 0338 06/24/19 2001 06/25/19 0647  HGB 13.4  --  12.3*  --   --   --   --   HCT 38.3*  --  34.5*  --   --   --   --   PLT 290  --  232  --   --   --   --   APTT 39*  --   --   --   --   --   --   LABPROT 26.2*  --   --    < > 23.2* 27.1* 26.0*  INR 2.4*  --   --    < > 2.1* 2.5* 2.4*  CREATININE 1.70*   < > 1.55*  --  1.59*  --  1.17   < > = values in this interval not displayed.    Estimated Creatinine Clearance: 59.6 mL/min (by C-G formula based on SCr of 1.17 mg/dL).   Assessment: 69 yo M with hx mechanical AVR on warfarin PTA now held for pending percutaneous chole drain. Will hold heparin until below 2.  Held warfarin since 1/24 but INR is still at 2.4. Plan for perc chole drain placement when INR < 1.5. Discussed with MD if want to reverse INR with vit K IV (~6 hr for onset of action) with or without FFP (onset ~1hr, rebound within 4hr after FFP) or hold off on drain as LFTs are down trending and patient is clinically improving without it. MD to discuss with IR today.    ADDENDUM: IR ordered HIDA scan to determine if needs perc chole drain, procedure on hold for today.   Goal of Therapy:  Heparin level 0.3-0.7 units/ml Monitor platelets by anticoagulation protocol: Yes   Plan:  When INR < 2, start heparin 1000 units,/hr If INR reversal is needed, give vitamin K IV 1mg  once. If FFP also given, suggest stat INR after FFP  Monitor daily INR, HL, CBC, plt Monitor for signs/symptoms of bleeding  F/u restart  warfarin    Benetta Spar, PharmD, BCPS, [facility] Clinical Pharmacist  Please check AMION for all [place] phone numbers After 10:00 PM, call [place]

## 2019-06-25 NOTE — Progress Notes (Signed)
Advanced Heart Failure Rounding Note   Subjective:    AB pain essentially resolved. HIDA looks normal to me. SBP 110-115.    No CP or SOB. Eating.    Objective:   Weight Range:  Vital Signs:   Temp:  [98.2 F (36.8 C)-99.5 F (37.5 C)] 98.2 F (36.8 C) (01/27 1519) Pulse Rate:  [67-73] 67 (01/27 1519) Resp:  [17-22] 17 (01/27 1519) BP: (104-116)/(55-63) 116/63 (01/27 1519) SpO2:  [96 %-98 %] 98 % (01/27 1519) Weight:  [75.8 kg] 75.8 kg (01/27 0423) Last BM Date: 06/24/19  Weight change: Filed Weights   06/23/19 0447 06/24/19 0351 06/25/19 0423  Weight: 75.4 kg 75.5 kg 75.8 kg    Intake/Output:   Intake/Output Summary (Last 24 hours) at 06/25/2019 1748 Last data filed at 06/25/2019 1646 Gross per 24 hour  Intake --  Output 500 ml  Net -500 ml     Physical Exam: General:  Well appearing. No resp difficulty HEENT: normal Neck: supple.JVP 7. Carotids 2+ bilat; no bruits. No lymphadenopathy or thryomegaly appreciated. Cor: PMI nondisplaced. Regular rate & rhythm mechanical s2 Lungs: clear Abdomen: soft, nontender, nondistended. No hepatosplenomegaly. No bruits or masses. Good bowel sounds. Extremities: no cyanosis, clubbing, rash, edema Neuro: alert & orientedx3, cranial nerves grossly intact. moves all 4 extremities w/o difficulty. Affect pleasant   Telemetry: NSR with pacing 80  +PVCs Personally reviewed   Labs: Basic Metabolic Panel: Recent Labs  Lab 06/22/19 1601 06/22/19 1601 06/22/19 2038 06/22/19 2216 06/24/19 0338 06/25/19 0647 06/25/19 1057  NA 133*  --   --  135 136 134*  --   K 3.8  --   --  3.7 3.2* 3.2*  --   CL 101  --   --  104 105 104  --   CO2 19*  --   --  19* 19* 22  --   GLUCOSE 135*  --   --  95 67* 100*  --   BUN 26*  --   --  28* 36* 21  --   CREATININE 1.70*  --   --  1.55* 1.59* 1.17  --   CALCIUM 8.9   < >  --  8.4* 8.2* 8.2*  --   MG  --   --  1.6*  --   --   --  2.2  PHOS  --   --  4.9*  --   --   --   --    < > =  values in this interval not displayed.    Liver Function Tests: Recent Labs  Lab 06/22/19 1601 06/22/19 2216 06/24/19 0338 06/25/19 0647  AST 295* 268* 179* 73*  ALT 257* 242* 207* 133*  ALKPHOS 146* 131* 96 74  BILITOT 4.9* 5.2* 2.1* 1.3*  PROT 6.5 5.9* 5.4* 5.3*  ALBUMIN 3.2* 2.8* 2.4* 2.2*   Recent Labs  Lab 06/22/19 1601  LIPASE 14   No results for input(s): AMMONIA in the last 168 hours.  CBC: Recent Labs  Lab 06/22/19 1601 06/22/19 2216  WBC 8.2 7.3  HGB 13.4 12.3*  HCT 38.3* 34.5*  MCV 123.9* 123.2*  PLT 290 232    Cardiac Enzymes: No results for input(s): CKTOTAL, CKMB, CKMBINDEX, TROPONINI in the last 168 hours.  BNP: BNP (last 3 results) No results for input(s): BNP in the last 8760 hours.  ProBNP (last 3 results) No results for input(s): PROBNP in the last 8760 hours.    Other results:  Imaging:  NM Hepatobiliary Liver Func  Result Date: 06/25/2019 CLINICAL DATA:  Abdominal pain, fever, chills, nausea, vomiting, jaundice, suspected acute cholecystitis by CT and ultrasound EXAM: NUCLEAR MEDICINE HEPATOBILIARY IMAGING TECHNIQUE: Sequential images of the abdomen were obtained out to 60 minutes following intravenous administration of radiopharmaceutical. RADIOPHARMACEUTICALS:  5.1 mCi Tc-108m  Choletec IV COMPARISON:  Ultrasound abdomen and CT abdomen 06/22/2019 FINDINGS: Normal tracer extraction from bloodstream indicating normal hepatocellular function. Prompt excretion of tracer into biliary tree. Small bowel visualized by 18 minutes. Gallbladder is seen before 1 hour though does knots appreciably fill with additional tracer over the next 30 minutes of imaging. Lateral view confirms tracer within contracted gallbladder. IMPRESSION: Patent CBD. Minimal tracer within a contracted gallbladder by 90 minutes of imaging indicating a contracted gallbladder with at least partially patent cystic duct. Electronically Signed   By: Lavonia Dana M.D.   On: 06/25/2019  15:16     Medications:     Scheduled Medications: . amiodarone  200 mg Oral Daily  . aspirin EC  81 mg Oral Daily  . docusate sodium  100 mg Oral BID  . hydroxyurea  1,000 mg Oral Once per day on Mon Wed Fri  . hydroxyurea  500 mg Oral Once per day on Sun Tue Thu Sat  . levothyroxine  50 mcg Oral Q0600  . potassium chloride  60 mEq Oral Once  . senna  1 tablet Oral BID  . sodium chloride flush  10-40 mL Intracatheter Q12H    Infusions: . sodium chloride 10 mL/hr at 06/24/19 0359  . piperacillin-tazobactam (ZOSYN)  IV 3.375 g (06/25/19 1016)    PRN Medications: sodium chloride, acetaminophen **OR** acetaminophen, ondansetron **OR** ondansetron (ZOFRAN) IV, oxyCODONE, polyethylene glycol, sodium chloride flush   Assessment/Plan:   1. Acute Cholecystitis:  - exam much improved today. - HIDA appears relatively normal to me. I d/w IR they will look at HIDA and let me know if he will have C-tube tomorrow. If not would dose coumadin tonight - at at least moderate risk for peri-op CV complications.  - Agree with perc drain (if needed) and definitive surgery when more stable.  - if needs C-tube can give FFP in am.    2. Chronic Systolic Heart Failure:  -Echo 05/2016 @ [facility], EF 40% -Echo 04/2017 EF 25-30% -Echo 04/2019 EF 20-25%. Diffuse hypokinesis, No LVH. RV normal. Mild AI, trace MR - s/p CRT-D.  - HF meds currently on hold due to hypotension + AKI. Delene Loll, Coreg, Arlyce Harman + Farxiga) - volume status looks good. BP improved - restart HF meds tomorrow  3. CAD:  - h/o remote PCI in 1994 at [facility] - s/p LHC in 2017 in the setting of Vfib Arrest w/ 3V CAD treated w/ CABG x 3 - no s/s angina - continue ASA during perioperative period - ? blocker on hold due to hypotension - statin on hold due to elevated LFTs   4. Mechanical Aortic Valve: - coumadin on hold for possible procedures - INR 2.4 (goal 2.0-3.0) - start IV heparin once INR < 2.0.  - If no procedure tomorrow  will dose warfarin tonight  5. AAA:  - slight size progression on admit CT scan, now measuring at 4.6 x 4.5 cm (nonruptured) - followed by [facility], Dr. Oneida Alar - home ? blocker on hold for low BP  - statin also on hold for elevated LFTs  6. AKI:  -admit SCr 1.7 (baseline 1.1-1.2) -likely prerenal from dehydration 2/2 vomiting -improved w/ IVFs -continue to hold  Lisabeth Register, Spironolactone and Coreg given low BP   - creatinine 1/17 today  7. Hypokalemia - k 3.2  supp  Length of Stay: 3   Glori Bickers MD 06/25/2019, 5:48 PM  Advanced Heart Failure Team Pager [phone] (M-F; 7a - 4p)  Please contact [facility] for night-coverage after hours (4p -7a ) and weekends on [email or web address]

## 2019-06-25 NOTE — Progress Notes (Signed)
Subjective: CC: Patient reports that his pain has completely resolved in his abdomen. He tolerated two trays of FLD yesterday without abdominal pain, n/v. Asking for update on the plan.   Objective: Vital signs in last 24 hours: Temp:  [97.9 F (36.6 C)-99.5 F (37.5 C)] 98.4 F (36.9 C) (01/27 0744) Pulse Rate:  [67-73] 67 (01/27 0744) Resp:  [15-22] 22 (01/27 0744) BP: (99-115)/(55-62) 115/61 (01/27 0744) SpO2:  [95 %-98 %] 98 % (01/27 0744) Weight:  [75.8 kg] 75.8 kg (01/27 0423) Last BM Date: 06/23/19  Intake/Output from previous day: 01/26 0701 - 01/27 0700 In: 310 [I.V.:110; IV Piggyback:200] Out: 1050 [Urine:1050] Intake/Output this shift: No intake/output data recorded.  PE: Gen:  Alert, NAD, pleasant HEENT: EOM's intact, pupils equal and round Card:  Mechanical click Pulm:  CTAB, no W/R/R, rate and effort normal Abd: Soft, ND, mild epigastric and RUQ TTP without rebound or guarding. He reports this is better than yesterday. +BS, no HSM Skin: no rashes noted, warm and dry  Lab Results:  Recent Labs    06/22/19 1601 06/22/19 2216  WBC 8.2 7.3  HGB 13.4 12.3*  HCT 38.3* 34.5*  PLT 290 232   BMET Recent Labs    06/24/19 0338 06/25/19 0647  NA 136 134*  K 3.2* 3.2*  CL 105 104  CO2 19* 22  GLUCOSE 67* 100*  BUN 36* 21  CREATININE 1.59* 1.17  CALCIUM 8.2* 8.2*   PT/INR Recent Labs    06/24/19 2001 06/25/19 0647  LABPROT 27.1* 26.0*  INR 2.5* 2.4*   CMP     Component Value Date/Time   NA 134 (L) 06/25/2019 0647   K 3.2 (L) 06/25/2019 0647   CL 104 06/25/2019 0647   CO2 22 06/25/2019 0647   GLUCOSE 100 (H) 06/25/2019 0647   BUN 21 06/25/2019 0647   CREATININE 1.17 06/25/2019 0647   CALCIUM 8.2 (L) 06/25/2019 0647   PROT 5.3 (L) 06/25/2019 0647   ALBUMIN 2.2 (L) 06/25/2019 0647   AST 73 (H) 06/25/2019 0647   ALT 133 (H) 06/25/2019 0647   ALKPHOS 74 06/25/2019 0647   BILITOT 1.3 (H) 06/25/2019 0647   GFRNONAA >60 06/25/2019  0647   GFRAA >60 06/25/2019 0647   Lipase     Component Value Date/Time   LIPASE 14 06/22/2019 1601       Studies/Results: No results found.  Anti-infectives: Anti-infectives (From admission, onward)   Start     Dose/Rate Route Frequency Ordered Stop   06/22/19 2200  piperacillin-tazobactam (ZOSYN) IVPB 3.375 g     3.375 g 12.5 mL/hr over 240 Minutes Intravenous Every 8 hours 06/22/19 2014     06/22/19 1830  piperacillin-tazobactam (ZOSYN) IVPB 3.375 g     3.375 g 100 mL/hr over 30 Minutes Intravenous  Once 06/22/19 1815 06/22/19 1916       Assessment/Plan CAD s/p CABG Systolic heart failure with EF 25%  VT arrest in 2017 S/p aortic mechanical valve replacement on Coumadin AAA HTN  Acute cholecystitis - Patient is high risk for perioperative complication per cardiology.  - IR obtaining HIDA scan before deciding if to proceed with percutaneous cholecystostomy tube. - Continue IV zosyn. Keep NPO until evaluated by IR.   ID - zosyn 1/24>> FEN - IVF, NPO VTE - Off coumadin and transitioning to heparin. INR up today at 2.4. If HIDA is positive and IR would like to proceed with perc chole tube, may need to be reversed before  procedure if it does not trend down on it's own tomorrow.  Foley - none Follow up - TBD   LOS: 3 days    Jillyn Ledger , [facility] 06/25/2019, 10:16 AM Please see Amion for pager number during day hours 7:00am-4:30pm

## 2019-06-25 NOTE — Progress Notes (Signed)
   06/25/19 1100  Clinical Encounter Type  Visited With Patient and family together  Visit Type Initial  Referral From Nurse  Consult/Referral To Chaplain  Spiritual Encounters  Spiritual Needs Literature  Stress Factors  Patient Stress Factors Health changes  Family Stress Factors None identified  Advance Directives (For Healthcare)  Does Patient Have a Medical Advance Directive? No  Would patient like information on creating a medical advance directive? Yes (Inpatient - patient requests chaplain consult to create a medical advance directive)   Chaplain responded to consult for advanced directive. Taylor [name] is requesting both a financial POA and a health care POA. Chaplain met with notary to confirm that she is able to notarize both documents. Chaplain left paperwork with Taylor [name] and his sister Taylor [name] to read over and determine Taylor [name]'s wishes.Taylor [name] will have RN notify spiritual care when [name] is ready for [employer], and Witnesses. Chaplains remains available for support as needs arise.   Chaplain Resident, Evelene Croon, M Div [phone] on-call pager

## 2019-06-25 NOTE — Progress Notes (Signed)
Chaplain accomplished notary of AD for patient.    De Burrs Chaplain Resident

## 2019-06-26 ENCOUNTER — Encounter (HOSPITAL_COMMUNITY): Payer: Self-pay | Admitting: Internal Medicine

## 2019-06-26 DIAGNOSIS — D649 Anemia, unspecified: Secondary | ICD-10-CM

## 2019-06-26 DIAGNOSIS — R6520 Severe sepsis without septic shock: Secondary | ICD-10-CM

## 2019-06-26 LAB — COMPREHENSIVE METABOLIC PANEL
ALT: 110 U/L — ABNORMAL HIGH (ref 0–44)
AST: 54 U/L — ABNORMAL HIGH (ref 15–41)
Albumin: 2.5 g/dL — ABNORMAL LOW (ref 3.5–5.0)
Alkaline Phosphatase: 84 U/L (ref 38–126)
Anion gap: 10 (ref 5–15)
BUN: 16 mg/dL (ref 8–23)
CO2: 22 mmol/L (ref 22–32)
Calcium: 8.4 mg/dL — ABNORMAL LOW (ref 8.9–10.3)
Chloride: 106 mmol/L (ref 98–111)
Creatinine, Ser: 1.17 mg/dL (ref 0.61–1.24)
GFR calc Af Amer: >60 mL/min (ref >60)
GFR calc non Af Amer: >60 mL/min (ref >60)
Glucose, Bld: 107 mg/dL — ABNORMAL HIGH (ref 70–99)
Potassium: 3.9 mmol/L (ref 3.5–5.1)
Sodium: 138 mmol/L (ref 135–145)
Total Bilirubin: 1.2 mg/dL (ref 0.3–1.2)
Total Protein: 6.2 g/dL — ABNORMAL LOW (ref 6.5–8.1)

## 2019-06-26 LAB — PROTIME-INR
INR: 2.5 — ABNORMAL HIGH (ref 0.8–1.2)
Prothrombin Time: 26.6 seconds — ABNORMAL HIGH (ref 11.4–15.2)

## 2019-06-26 LAB — GLUCOSE, CAPILLARY: Glucose-Capillary: 103 mg/dL — ABNORMAL HIGH (ref 70–99)

## 2019-06-26 MED ORDER — METRONIDAZOLE 500 MG PO TABS: 500.0000 mg | ORAL_TABLET | Freq: Three times a day (TID) | ORAL | 0 refills | Status: AC

## 2019-06-26 MED ORDER — POLYETHYLENE GLYCOL 3350 17 G PO PACK: 17.0000 g | PACK | Freq: Every day | ORAL | 0 refills | Status: DC | PRN

## 2019-06-26 MED ORDER — SACUBITRIL-VALSARTAN 24-26 MG PO TABS
1.0000 | ORAL_TABLET | Freq: Two times a day (BID) | ORAL | Status: DC
  Filled 2019-06-26: qty 1

## 2019-06-26 MED ORDER — WARFARIN SODIUM 2.5 MG PO TABS
2.5000 mg | ORAL_TABLET | Freq: Once | ORAL | Status: AC
  Administered 2019-06-26: 2.5 mg via ORAL
  Filled 2019-06-26: qty 1

## 2019-06-26 MED ORDER — ONDANSETRON HCL 4 MG PO TABS: 4.0000 mg | ORAL_TABLET | Freq: Four times a day (QID) | ORAL | 0 refills | Status: DC | PRN

## 2019-06-26 MED ORDER — CARVEDILOL 3.125 MG PO TABS: 3.1250 mg | ORAL_TABLET | Freq: Two times a day (BID) | ORAL | 0 refills | Status: DC

## 2019-06-26 MED ORDER — CARVEDILOL 3.125 MG PO TABS
3.1250 mg | ORAL_TABLET | Freq: Two times a day (BID) | ORAL | Status: DC
  Administered 2019-06-26: 17:00:00 3.125 mg via ORAL
  Filled 2019-06-26: qty 1

## 2019-06-26 MED ORDER — CIPROFLOXACIN HCL 500 MG PO TABS: 500.0000 mg | ORAL_TABLET | Freq: Two times a day (BID) | ORAL | 0 refills | Status: AC

## 2019-06-26 MED ORDER — DOCUSATE SODIUM 100 MG PO CAPS: 100.0000 mg | ORAL_CAPSULE | Freq: Two times a day (BID) | ORAL | 0 refills | Status: DC

## 2019-06-26 MED ORDER — WARFARIN - PHARMACIST DOSING INPATIENT: Freq: Every day | Status: DC

## 2019-06-26 MED FILL — CARVEDILOL 3.125 MG TABLET: 3.125 | 30 days supply | Qty: 60 | Fill #0

## 2019-06-26 MED FILL — ONDANSETRON HCL 4 MG TABLET: 4 | 8 days supply | Qty: 30 | Fill #0

## 2019-06-26 MED FILL — CIPROFLOXACIN HCL 500 MG TA: 500 | 10 days supply | Qty: 20 | Fill #0

## 2019-06-26 MED FILL — metroNIDAZOLE 500 MG TABS: 500 | 10 days supply | Qty: 30 | Fill #0

## 2019-06-26 MED FILL — POLYETHYLENE GLYCOL 3350 PO: 17 | 14 days supply | Qty: 238 | Fill #0

## 2019-06-26 MED FILL — DOK 100 MG CAPS: 100 | 5 days supply | Qty: 10 | Fill #0

## 2019-06-26 NOTE — Discharge Instructions (Signed)
Gallbladder Eating Plan If you have a gallbladder condition, you may have trouble digesting fats. Eating a low-fat diet can help reduce your symptoms, and may be helpful before and after having surgery to remove your gallbladder (cholecystectomy). Your health care provider may recommend that you work with a diet and nutrition specialist (dietitian) to help you reduce the amount of fat in your diet. What are tips for following this plan? General guidelines  Limit your fat intake to less than 30% of your total daily calories. If you eat around 1,800 calories each day, this is less than 60 grams (g) of fat per day.  Fat is an important part of a healthy diet. Eating a low-fat diet can make it hard to maintain a healthy body weight. Ask your dietitian how much fat, calories, and other nutrients you need each day.  Eat small, frequent meals throughout the day instead of three large meals.  Drink at least 8-10 cups of fluid a day. Drink enough fluid to keep your urine clear or pale yellow.  Limit alcohol intake to no more than 1 drink a day for nonpregnant women and 2 drinks a day for men. One drink equals 12 oz of beer, 5 oz of wine, or 1 oz of hard liquor. Reading food labels  Check Nutrition Facts on food labels for the amount of fat per serving. Choose foods with less than 3 grams of fat per serving. Shopping  Choose nonfat and low-fat healthy foods. Look for the words "nonfat," "low fat," or "fat free."  Avoid buying processed or prepackaged foods. Cooking  Cook using low-fat methods, such as baking, broiling, grilling, or boiling.  Cook with small amounts of healthy fats, such as olive oil, grapeseed oil, canola oil, or sunflower oil. What foods are recommended?   All fresh, frozen, or canned fruits and vegetables.  Whole grains.  Low-fat or non-fat (skim) milk and yogurt.  Lean meat, skinless poultry, fish, eggs, and beans.  Low-fat protein supplement powders or  drinks.  Spices and herbs. What foods are not recommended?  High-fat foods. These include baked goods, fast food, fatty cuts of meat, ice cream, french toast, sweet rolls, pizza, cheese bread, foods covered with butter, creamy sauces, or cheese.  Fried foods. These include french fries, tempura, battered fish, breaded chicken, fried breads, and sweets.  Foods with strong odors.  Foods that cause bloating and gas. Summary  A low-fat diet can be helpful if you have a gallbladder condition, or before and after gallbladder surgery.  Limit your fat intake to less than 30% of your total daily calories. This is about 60 g of fat if you eat 1,800 calories each day.  Eat small, frequent meals throughout the day instead of three large meals. This information is not intended to replace advice given to you by your health care provider. Make sure you discuss any questions you have with your health care provider. Document Revised: 09/05/2018 Document Reviewed: 06/22/2016 Elsevier Patient Education  2020 Elsevier Inc.  

## 2019-06-26 NOTE — TOC Transition Note (Signed)
Transition of Care [facility]) - CM/SW Discharge Note Marvetta Gibbons RN, BSN Transitions of Care Unit 4E- RN Case Manager [phone]   Patient Details  Name: Taylor [name] MRN: [id number] Date of Birth: 1951/01/12  Transition of Care [facility]) CM/SW Contact:  Dawayne Patricia, RN Phone Number: 06/26/2019, 5:04 PM   Clinical Narrative:    Pt stable for transition home, meds have been sent to [facility] and they will fill and deliver to bedside prior to pt leaving hospital. Orders have been placed for HHPT- CM spoke with pt at bedside- pt would like [facility] services- choice offered for agency- Per CMS guidelines from medicare.gov website with star ratings (copy placed in shadow chart)- pt states he will be staying with sister in [place]- and would like to use [facility]- orders faxed to agency- however since it is after 5pm- CM will have to call in am to f/u on on referral and see if agency can accept- pt aware- and if agency can not accept will call pt in AM so another agency can be selected. - pt voiced understanding and agrees with plan. Pt also informed this CM that his insurance plan will change on Feb. 1 and provided cards- copies made of both current and new card- faxed copies of insurance cards to admitting and also placed in shadow chart- will also provide info to [facility] agency.  [place] [address] Contact phone # [phone] Per pt he has all needed DME at home- no DME needs noted.   Final next level of care: Home w Home Health Services Barriers to Discharge: No Barriers Identified   Patient Goals and CMS Choice Patient states their goals for this hospitalization and ongoing recovery are:: return home- get stronger- going to stay with my sister CMS Medicare.gov Compare Post Acute Care list provided to:: Patient Choice offered to / list presented to : Patient  Discharge Placement               Home with  [facility]        Discharge Plan and Services   Discharge Planning Services: CM Consult Post Acute Care Choice: Home Health          DME Arranged: N/A DME Agency: NA       HH Arranged: PT [place] Agency: [place] Date [place]: 06/27/19      Social Determinants of Health ([place]) Interventions     Readmission Risk Interventions Readmission Risk Prevention Plan 06/26/2019  Transportation Screening Complete  PCP or Specialist Appt within 5-7 Days Complete  Home Care Screening Complete  Medication Review (RN CM) Complete  Some recent data might be hidden

## 2019-06-26 NOTE — Progress Notes (Addendum)
   Possible  acute on chronic Cholecystitis Was tentatively scheduled for possible percutaneous cholecystostomy drain INR high and HIDA now resulted  LD coumadin 1/23 INR 2.5 today  HIDA: IMPRESSION: Patent CBD. ----Minimal tracer within a contracted gallbladder by 90 minutes of imaging indicating a contracted gallbladder with at least partially patent cystic duct.  Pt feeling better daily less  abd pain per chart Afeb; wbc wnl  Discussed with Dr Anselm Pancoast Will HOLD on cholecystostomy drain at this time Dr Anselm Pancoast discussed with Dr Haroldine Laws  Will await CCS input

## 2019-06-26 NOTE — Progress Notes (Addendum)
ANTICOAGULATION CONSULT NOTE   Pharmacy Consult for heparin Indication: mech AVR   Patient Measurements: Height: 5\' 9"  (175.3 cm) Weight: 164 lb (74.4 kg) IBW/kg (Calculated) : 70.7 Heparin Dosing Weight: 75.4 kg  Vital Signs: Temp: 97.9 F (36.6 C) (01/28 0828) Temp Source: Oral (01/28 0828) BP: 122/62 (01/28 0828) Pulse Rate: 73 (01/28 0828)  Labs: Recent Labs    06/24/19 0338 06/24/19 0338 06/24/19 2001 06/25/19 0647 06/26/19 0441  LABPROT 23.2*   < > 27.1* 26.0* 26.6*  INR 2.1*   < > 2.5* 2.4* 2.5*  CREATININE 1.59*  --   --  1.17 1.17   < > = values in this interval not displayed.    Estimated Creatinine Clearance: 59.6 mL/min (by C-G formula based on SCr of 1.17 mg/dL).   Assessment: 69 yo M with hx mechanical AVR on warfarin PTA now held for pending percutaneous chole drain. Will hold heparin until below 2.  Held warfarin since 1/24 but INR is still within range at 2.5.  Plan was for perc chole drain, HIDA scan now resulted and IR is holding off on drain at this time and will discuss with surgery. If no drain planned will restart warfarin. No heparin for now with elevated INR.     Goal of Therapy:  Heparin level 0.3-0.7 units/ml Monitor platelets by anticoagulation protocol: Yes   Plan:  When INR < 2, start heparin 1000 units,/hr If INR reversal is needed, give vitamin K IV 1mg  once. If FFP also given, suggest stat INR after FFP  Monitor daily INR, HL, CBC, plt Monitor for signs/symptoms of bleeding  F/u restart warfarin    Erin Hearing PharmD., BCPS Clinical Pharmacist 06/26/2019 8:43 AM  Addendum:  No drain, continue with warfarin tonight. Resume home dose of 2.5mg  daily.

## 2019-06-26 NOTE — Progress Notes (Addendum)
Advanced Heart Failure Rounding Note   Subjective:    Had abdominal pain last night but says it resolved after he had bowel movement.   INR 2.5 today. No bleeding.   Denies N/V abdominal pain.   Objective:   Weight Range:  Vital Signs:   Temp:  [97.6 F (36.4 C)-98.9 F (37.2 C)] 97.6 F (36.4 C) (01/28 0437) Pulse Rate:  [67-81] 81 (01/28 0437) Resp:  [16-19] 16 (01/28 0437) BP: (115-129)/(63-73) 119/67 (01/28 0437) SpO2:  [97 %-98 %] 98 % (01/28 0437) Weight:  [74.4 kg] 74.4 kg (01/28 0437) Last BM Date: 06/24/19  Weight change: Filed Weights   06/24/19 0351 06/25/19 0423 06/26/19 0437  Weight: 75.5 kg 75.8 kg 74.4 kg    Intake/Output:   Intake/Output Summary (Last 24 hours) at 06/26/2019 0747 Last data filed at 06/26/2019 0426 Gross per 24 hour  Intake 775.17 ml  Output 850 ml  Net -74.83 ml     Physical Exam: General:  No resp difficulty HEENT: normal Neck: supple. no JVD. Carotids 2+ bilat; no bruits. No lymphadenopathy or thryomegaly appreciated. Cor: PMI nondisplaced. Regular rate & rhythm. No rubs, gallops. Mechanical s2. Lungs: clear Abdomen: soft, nontender, nondistended. No hepatosplenomegaly. No bruits or masses. Good bowel sounds. Extremities: no cyanosis, clubbing, rash, edema Neuro: alert & orientedx3, cranial nerves grossly intact. moves all 4 extremities w/o difficulty. Affect pleasant   Telemetry: NSR V pacing 84 bpm rare PVC  Labs: Basic Metabolic Panel: Recent Labs  Lab 06/22/19 1601 06/22/19 1601 06/22/19 2038 06/22/19 2216 06/22/19 2216 06/24/19 0338 06/25/19 0647 06/25/19 1057 06/26/19 0441  NA 133*  --   --  135  --  136 134*  --  138  K 3.8  --   --  3.7  --  3.2* 3.2*  --  3.9  CL 101  --   --  104  --  105 104  --  106  CO2 19*  --   --  19*  --  19* 22  --  22  GLUCOSE 135*  --   --  95  --  67* 100*  --  107*  BUN 26*  --   --  28*  --  36* 21  --  16  CREATININE 1.70*  --   --  1.55*  --  1.59* 1.17  --  1.17    CALCIUM 8.9   < >  --  8.4*   < > 8.2* 8.2*  --  8.4*  MG  --   --  1.6*  --   --   --   --  2.2  --   PHOS  --   --  4.9*  --   --   --   --   --   --    < > = values in this interval not displayed.    Liver Function Tests: Recent Labs  Lab 06/22/19 1601 06/22/19 2216 06/24/19 0338 06/25/19 0647 06/26/19 0441  AST 295* 268* 179* 73* 54*  ALT 257* 242* 207* 133* 110*  ALKPHOS 146* 131* 96 74 84  BILITOT 4.9* 5.2* 2.1* 1.3* 1.2  PROT 6.5 5.9* 5.4* 5.3* 6.2*  ALBUMIN 3.2* 2.8* 2.4* 2.2* 2.5*   Recent Labs  Lab 06/22/19 1601  LIPASE 14   No results for input(s): AMMONIA in the last 168 hours.  CBC: Recent Labs  Lab 06/22/19 1601 06/22/19 2216  WBC 8.2 7.3  HGB 13.4 12.3*  HCT  38.3* 34.5*  MCV 123.9* 123.2*  PLT 290 232    Cardiac Enzymes: No results for input(s): CKTOTAL, CKMB, CKMBINDEX, TROPONINI in the last 168 hours.  BNP: BNP (last 3 results) No results for input(s): BNP in the last 8760 hours.  ProBNP (last 3 results) No results for input(s): PROBNP in the last 8760 hours.    Other results:  Imaging: NM Hepatobiliary Liver Func  Result Date: 06/25/2019 CLINICAL DATA:  Abdominal pain, fever, chills, nausea, vomiting, jaundice, suspected acute cholecystitis by CT and ultrasound EXAM: NUCLEAR MEDICINE HEPATOBILIARY IMAGING TECHNIQUE: Sequential images of the abdomen were obtained out to 60 minutes following intravenous administration of radiopharmaceutical. RADIOPHARMACEUTICALS:  5.1 mCi Tc-92m  Choletec IV COMPARISON:  Ultrasound abdomen and CT abdomen 06/22/2019 FINDINGS: Normal tracer extraction from bloodstream indicating normal hepatocellular function. Prompt excretion of tracer into biliary tree. Small bowel visualized by 18 minutes. Gallbladder is seen before 1 hour though does knots appreciably fill with additional tracer over the next 30 minutes of imaging. Lateral view confirms tracer within contracted gallbladder. IMPRESSION: Patent CBD. Minimal  tracer within a contracted gallbladder by 90 minutes of imaging indicating a contracted gallbladder with at least partially patent cystic duct. Electronically Signed   By: Lavonia Dana M.D.   On: 06/25/2019 15:16     Medications:     Scheduled Medications: . amiodarone  200 mg Oral Daily  . aspirin EC  81 mg Oral Daily  . docusate sodium  100 mg Oral BID  . hydroxyurea  1,000 mg Oral Once per day on Mon Wed Fri  . hydroxyurea  500 mg Oral Once per day on Sun Tue Thu Sat  . levothyroxine  50 mcg Oral Q0600  . senna  1 tablet Oral BID  . sodium chloride flush  10-40 mL Intracatheter Q12H    Infusions: . sodium chloride 10 mL/hr at 06/24/19 0359  . piperacillin-tazobactam (ZOSYN)  IV 3.375 g (06/26/19 0146)    PRN Medications: sodium chloride, acetaminophen **OR** acetaminophen, ondansetron **OR** ondansetron (ZOFRAN) IV, oxyCODONE, polyethylene glycol, sodium chloride flush   Assessment/Plan:   1. Acute Cholecystitis:  - exam much improved today. -Per Dr Haroldine Laws HIDA appears relatively normal to me. I d/w IR they will look at HIDA and let me know if he will have C-tube tomorrow. If not would dose coumadin tonight - at at least moderate risk for peri-op CV complications.  - Agree with perc drain (if needed) and definitive surgery when more stable.  - if needs C-tube can give FFP in am.  - INR 2.5  2. Chronic Systolic Heart Failure:  -Echo 05/2016 @ [facility], EF 40% -Echo 04/2017 EF 25-30% -Echo 04/2019 EF 20-25%. Diffuse hypokinesis, No LVH. RV normal. Mild AI, trace MR - s/p CRT-D.  - HF meds held due to hypotension + AKI. Delene Loll, Coreg, Arlyce Harman + Farxiga) - Volume status stable.  BP improved - Hold off on HF meds until after procedure.   3. CAD:  - h/o remote PCI in 1994 at [place] - s/p LHC in 2017 in the setting of Vfib Arrest w/ 3V CAD treated w/ CABG x 3 - no s/s angina - continue ASA during perioperative period - ? blocker on hold due to hypotension - LFTS trending  down. Anticipate restarting statin tomorrow.    4. Mechanical Aortic Valve: - coumadin on hold for possible procedures - INR 2.5 (goal 2.0-3.0) - start IV heparin once INR < 2.0.  - If no procedure  will dose warfarin tonight  5. AAA:  - slight size progression on admit CT scan, now measuring at 4.6 x 4.5 cm (nonruptured) - followed by [facility], Dr. Oneida Alar - home ? blocker on hold for low BP  - statin also on hold for elevated LFTs  6. AKI:  -admit SCr 1.7 (baseline 1.1-1.2) -likely prerenal from dehydration 2/2 vomiting -improved w/ IVFs -continue to hold Entresto, Farxiga, Spironolactone and Coreg given low BP   - Creatinine normalized.    7. Hypokalemia K stable 3.9    Length of Stay: 4   Amy Clegg NP-C  06/26/2019, 7:47 AM  Advanced Heart Failure Team Pager [phone] (M-F; [place])  Please contact [facility] for night-coverage after hours (4p -7a ) and weekends on [email or web address]   Much improved. Cholecystitis stabilized with medical therapy.BP and volume status stable. Taking pos.   INR 2.5.   IHave dicussed case with IR and [facility]. Will likely need eventual lap chole given evidence of chronic gallbladder inflammation.   We are happy to help coordinate his pre and pos-op management when time comes.   Can restart HF meds gently. Entresto 24/26 and carvedilol 3.125 bid.   Glori Bickers, MD  3:02 PM

## 2019-06-26 NOTE — Progress Notes (Signed)
Subjective: CC: Patient reports that he tolerated HH dinner yesterday. He denies any abdominal pain, n/v. IR not planning to perform Perc Chole drain based on HIDA results.   Objective: Vital signs in last 24 hours: Temp:  [97.6 F (36.4 C)-98.9 F (37.2 C)] 97.9 F (36.6 C) (01/28 0828) Pulse Rate:  [67-81] 73 (01/28 0828) Resp:  [16-20] 20 (01/28 0828) BP: (115-129)/(62-73) 122/62 (01/28 0828) SpO2:  [97 %-99 %] 99 % (01/28 0828) Weight:  [74.4 kg] 74.4 kg (01/28 0437) Last BM Date: 06/24/19  Intake/Output from previous day: 01/27 0701 - 01/28 0700 In: 775.2 [P.O.:240; I.V.:335.2; IV Piggyback:200] Out: 850 [Urine:850] Intake/Output this shift: No intake/output data recorded.  PE: Gen:  Alert, NAD, pleasant Abd: Soft, mild distension, mild tenderness of the epigastrium and RUQ, +BS Psych: A&Ox3  Skin: no rashes noted, warm and dry   Lab Results:  No results for input(s): WBC, HGB, HCT, PLT in the last 72 hours. BMET Recent Labs    06/25/19 0647 06/26/19 0441  NA 134* 138  K 3.2* 3.9  CL 104 106  CO2 22 22  GLUCOSE 100* 107*  BUN 21 16  CREATININE 1.17 1.17  CALCIUM 8.2* 8.4*   PT/INR Recent Labs    06/25/19 0647 06/26/19 0441  LABPROT 26.0* 26.6*  INR 2.4* 2.5*   CMP     Component Value Date/Time   NA 138 06/26/2019 0441   K 3.9 06/26/2019 0441   CL 106 06/26/2019 0441   CO2 22 06/26/2019 0441   GLUCOSE 107 (H) 06/26/2019 0441   BUN 16 06/26/2019 0441   CREATININE 1.17 06/26/2019 0441   CALCIUM 8.4 (L) 06/26/2019 0441   PROT 6.2 (L) 06/26/2019 0441   ALBUMIN 2.5 (L) 06/26/2019 0441   AST 54 (H) 06/26/2019 0441   ALT 110 (H) 06/26/2019 0441   ALKPHOS 84 06/26/2019 0441   BILITOT 1.2 06/26/2019 0441   GFRNONAA >60 06/26/2019 0441   GFRAA >60 06/26/2019 0441   Lipase     Component Value Date/Time   LIPASE 14 06/22/2019 1601       Studies/Results: NM Hepatobiliary Liver Func  Result Date: 06/25/2019 CLINICAL DATA:   Abdominal pain, fever, chills, nausea, vomiting, jaundice, suspected acute cholecystitis by CT and ultrasound EXAM: NUCLEAR MEDICINE HEPATOBILIARY IMAGING TECHNIQUE: Sequential images of the abdomen were obtained out to 60 minutes following intravenous administration of radiopharmaceutical. RADIOPHARMACEUTICALS:  5.1 mCi Tc-9m  Choletec IV COMPARISON:  Ultrasound abdomen and CT abdomen 06/22/2019 FINDINGS: Normal tracer extraction from bloodstream indicating normal hepatocellular function. Prompt excretion of tracer into biliary tree. Small bowel visualized by 18 minutes. Gallbladder is seen before 1 hour though does knots appreciably fill with additional tracer over the next 30 minutes of imaging. Lateral view confirms tracer within contracted gallbladder. IMPRESSION: Patent CBD. Minimal tracer within a contracted gallbladder by 90 minutes of imaging indicating a contracted gallbladder with at least partially patent cystic duct. Electronically Signed   By: Lavonia Dana M.D.   On: 06/25/2019 15:16    Anti-infectives: Anti-infectives (From admission, onward)   Start     Dose/Rate Route Frequency Ordered Stop   06/22/19 2200  piperacillin-tazobactam (ZOSYN) IVPB 3.375 g     3.375 g 12.5 mL/hr over 240 Minutes Intravenous Every 8 hours 06/22/19 2014     06/22/19 1830  piperacillin-tazobactam (ZOSYN) IVPB 3.375 g     3.375 g 100 mL/hr over 30 Minutes Intravenous  Once 06/22/19 1815 06/22/19 1916  Assessment/Plan CADs/pCABG Systolic heart failure with EF 25%  VT arrest in 2017 S/p aortic mechanical valve replacement on Coumadin AAA HTN  Acute cholecystitis - Patient is high risk for perioperative complication per cardiology.  - HIDA w/ partially patent cystic duct - IR holding off on Perc Chole Tube - Still feel he would benefit from abx - Given no procedures planned and patient is improving on abx, okay to restart coumadin  - If tolerates diet this am, he is okay for d/c w/ 10-14  days of abx total and follow up in the office  ID -zosyn 1/24>> (day 5) FEN -HH VTE -SCDs, Okay to restart coumadin from our standpoint Foley -none Follow up -TBD   LOS: 4 days    Jillyn Ledger , [facility] 06/26/2019, 9:51 AM Please see Amion for pager number during day hours 7:00am-4:30pm

## 2019-06-26 NOTE — Discharge Summary (Signed)
Physician Discharge Summary  [facility] [id number] DOB: Sep 27, 1950 DOA: 06/22/2019  PCP: Michell Heinrich, DO  Admit date: 06/22/2019 Discharge date: 06/26/2019  Admitted From: Home Disposition:  Home  Recommendations for Outpatient Follow-up:  1. Follow up with General Surgery in 1-2 weeks 2. Please obtain BMP/CBC in one week   Home Health:Yes, PT Equipment/Devices:None Discharge Condition: Stable CODE STATUS: Full Diet recommendation: Heart Healthy   Brief/Interim Summary: Trinton Macfadyen is an 69 y.o. male past medical history significant for CAD status post CABG, systolic heart failure with  EF 25% status post CRT-D, VT arrest in 2017, status post aortic mechanical valve replacement on Coumadin, AAA, essential hypertension who presents to the ED with 1 day of fever chills abdominal pain nausea and vomiting.  Patient appears jaundice, with a light-colored stools and dark urine in the ED was found to be in acute renal failure with elevated LFTs and INR of 2.1, CT scan of the abdomen and pelvis showed acute cholecystitis and a nonrupture 4.6 x 4.5 cm infrarenal AAA.  On Zosyn, Abdominal ultrasound on 06/22/2019 show gallbladder wall thickening with choledocholithiasis pericholecystic fluid and Murphy's sign elicited by technologist. IR and General Surgery consulted.   Discharge Diagnoses:  Active Problems:   Chronic systolic heart failure (HCC)   Essential thrombocythemia (HCC)   Acute cholecystitis   Essential hypertension   Sepsis (HCC)   AKI (acute kidney injury) (HCC)   Hyponatremia   Normal anion gap metabolic acidosis   Normocytic anemia  Severe sepsis secondary to acute cholecystitis: Completed 5 days of IV Zosyn. Afebrile with normal WBC on discharge. HIDA scan with no evidence of acute cholecystitis.  Sepsis poa, patient presented with fever, renal failure and hypotension (but no shock). Sepsis resolved prior to his discharge.  LFT's continue to down-trend. Surgery  recommended percutaneous drainage cardiology was consulted; clearance for surgery given and patient has been NPO and awaiting INR to be ~ 1.5 for percutaneous cholecystostomy.  IR consulted. However, patient is overall clinically improving and LFT's improving. HIDA scan with no indication of acute process. No drain placement needed.  Patient tolerating a diet well. No longer having abdominal pain.  General Surgery has recommended outpatient treatment with antibiotics and close follow up.  Acute kidney injury (resolved) Likely prerenal in the setting of antihypertensive medication, Coreg Entresto and Aldactone were held. Baseline creatinine around 1.1. Cr 1.17 on 1/27  Preop evaluation: Cards consulted and he is moderate risk for perioperative cardiovascular complications. He will follow up with them in the outpatient setting.   Coreg dose reduced to 3.5mg  po BID from 6.125 mg po bid.  Aldactone held for now.  Continue warfarin--he has follow up at his coumadin in [place] on 2/3. Will need close monitoring while on antibiotics.  Continue Entresto.   Aortic mechanical valve on Coumadin: Holding Coumadin in anticipation of operative management. INR has remained therapeutic, however. INR of 2.5 on the day of discharge.   Essential hypertension/ischemic cardiomyopathy with an EF of 25%/hypotension: Per Cardiology recommendations:  Coreg dose reduced to 3.5mg  po BID from 6.125 mg po bid.  Aldactone held for now.  Continue warfarin--he has follow up at his coumadin in [place] on 2/3.  Continue Entresto.   Essential thrombocytosis: Continue aspirin and hydroxyurea.  Hypothyroidism: Continue Synthroid.  Mild hyponatremia (resolved) likely hypovolemic, resolved with IV fluid hydration.  Normal anion gap metabolic acidosis (resolved) Likely due to acute renal failure, resolved with IV fluid hydration.  Normocytic anemia Follow-up with PCP as an outpatient.  Discharge  Instructions  Discharge Instructions    Diet - low sodium heart healthy   Complete by: As directed    Increase activity slowly   Complete by: As directed      Allergies as of 06/26/2019      Reactions   Erythromycin Rash   Atorvastatin Calcium Other (See Comments)   Rosuvastatin Calcium    Unknown   Simvastatin    Unknown   Zithromax [azithromycin] Rash      Medication List    STOP taking these medications   diphenhydrAMINE 25 MG tablet Commonly known as: BENADRYL   spironolactone 25 MG tablet Commonly known as: ALDACTONE     TAKE these medications   amiodarone 200 MG tablet Commonly known as: PACERONE TAKE 1 TABLET(200 MG) BY MOUTH DAILY What changed: See the new instructions.   aspirin 81 MG tablet Take 1 tablet by mouth.   atorvastatin 40 MG tablet Commonly known as: LIPITOR Take 1 tablet (40 mg total) by mouth at bedtime. What changed: when to take this   carvedilol 3.125 MG tablet Commonly known as: COREG Take 1 tablet (3.125 mg total) by mouth 2 (two) times daily with a meal. What changed:   medication strength  how much to take   ciprofloxacin 500 MG tablet Commonly known as: Cipro Take 1 tablet (500 mg total) by mouth 2 (two) times daily for 10 days.   docusate sodium 100 MG capsule Commonly known as: COLACE Take 1 capsule (100 mg total) by mouth 2 (two) times daily.   Entresto 24-26 MG Generic drug: sacubitril-valsartan Take 1 tablet by mouth daily.   Farxiga 10 MG Tabs tablet Generic drug: dapagliflozin propanediol Take 10 mg by mouth daily.   hydroxyurea 500 MG capsule Commonly known as: HYDREA TAKE 1 CAPSULE BY MOUTH ALTERNATING WITH 2 CAPSULES DAILY(500 MG ONE DAY AND 1000 MG NEXT DAY). What changed:   how much to take  how to take this  when to take this   levothyroxine 25 MCG tablet Commonly known as: SYNTHROID Take 2 tablets (50 mcg total) by mouth daily. Additional refills should come from PCP   metroNIDAZOLE 500 MG  tablet Commonly known as: FLAGYL Take 1 tablet (500 mg total) by mouth 3 (three) times daily for 10 days.   ondansetron 4 MG tablet Commonly known as: ZOFRAN Take 1 tablet (4 mg total) by mouth every 6 (six) hours as needed for nausea.   polyethylene glycol 17 g packet Commonly known as: MIRALAX / GLYCOLAX Take 17 g by mouth daily as needed for mild constipation.   warfarin 5 MG tablet Commonly known as: COUMADIN Take as directed. If you are unsure how to take this medication, talk to your nurse or doctor. Original instructions: Take as directed by coumadin clinic What changed:   how much to take  how to take this  when to take this      Follow-up Information    Georganna Skeans, MD. Go on 07/16/2019.   Specialty: General Surgery Why: 930am. Please arrive 30 minutes early for paperwork. Please bring a copy of your photo ID and insurance card.  Contact information: [address] [place] [address] [phone]        Jolaine Artist, MD Follow up on 07/03/2019.   Specialty: Cardiology Why: at 930 Contact information: [place] [address] [phone]          Allergies  Allergen Reactions  . Erythromycin  Rash  . Atorvastatin Calcium Other (See Comments)  . Rosuvastatin Calcium     Unknown  . Simvastatin     Unknown  . Zithromax [Azithromycin] Rash    Consultations:  Cardiology-HF team  General Surgery  IR   Procedures/Studies: CT ABDOMEN PELVIS WO CONTRAST  Result Date: 06/22/2019 CLINICAL DATA:  Abdominal pain. EXAM: CT CHEST, ABDOMEN AND PELVIS WITHOUT CONTRAST TECHNIQUE: Multidetector CT imaging of the chest, abdomen and pelvis was performed following the standard protocol without IV contrast. COMPARISON:  None. FINDINGS: CT CHEST FINDINGS Cardiovascular: There is a dual lead AICD. There is marked severity calcification of the thoracic aorta without evidence of aneurysmal dilatation. An  artificial aortic valve is noted. Normal heart size. No pericardial effusion. Marked severity coronary artery calcification is seen. Mediastinum/Nodes: Lungs/Pleura: Mild areas of atelectasis and/or infiltrate are seen within the left upper lobe and posterior aspect of the left lung base. There is no evidence of a pleural effusion or pneumothorax. Musculoskeletal: Multiple sternal wires are seen. Multilevel degenerative changes are noted throughout the thoracic spine. CT ABDOMEN PELVIS FINDINGS Hepatobiliary: No focal liver abnormality is seen. Numerous tiny gallstones are seen within the lumen of a partially contracted gallbladder. A very mild amount of pericholecystic inflammation is noted. Pancreas: Unremarkable. No pancreatic ductal dilatation or surrounding inflammatory changes. Spleen: Normal in size without focal abnormality. Adrenals/Urinary Tract: Adrenal glands are unremarkable. Kidneys are normal in size, without renal calculi or hydronephrosis. Small areas of parenchymal calcification and adjacent focal scarring are seen within the lower pole the left kidney. A 2.5 cm x 1.6 cm adjacent area of low attenuation is seen within the posterior aspect of the lower pole on the left (axial CT image 37, CT series number 8). Bladder is unremarkable. Stomach/Bowel: Stomach is within normal limits. The appendix is not identified. No evidence of bowel wall thickening, distention, or inflammatory changes. Vascular/Lymphatic: There is marked severity aortic atherosclerosis. 4.6 cm x 4.5 cm aneurysmal dilatation of the infrarenal abdominal aorta is seen. No enlarged abdominal or pelvic lymph nodes. Reproductive: The prostate gland is mildly enlarged. Other: No abdominal wall hernia or abnormality. No abdominopelvic ascites. Musculoskeletal: Multilevel degenerative changes are seen throughout the lumbar spine. IMPRESSION: 1. Cholelithiasis with additional findings suggestive of acute cholecystitis. 2. 4.6 cm x 4.5 cm  infrarenal abdominal aortic aneurysm. 3. Evidence of prior median sternotomy. 4. Artificial aortic valve. 5. Focal scarring and calcification within the lower pole of the left kidney with an adjacent area of low attenuation which may represent a small renal cyst. Correlation with renal ultrasound is recommended. Electronically Signed   By: Virgina Norfolk M.D.   On: 06/22/2019 17:59   CT CHEST WO CONTRAST  Result Date: 06/22/2019 CLINICAL DATA:  Abdominal pain. EXAM: CT CHEST, ABDOMEN AND PELVIS WITHOUT CONTRAST TECHNIQUE: Multidetector CT imaging of the chest, abdomen and pelvis was performed following the standard protocol without IV contrast. COMPARISON:  None. FINDINGS: CT CHEST FINDINGS Cardiovascular: There is a dual lead AICD. There is marked severity calcification of the thoracic aorta without evidence of aneurysmal dilatation. An artificial aortic valve is noted. Normal heart size. No pericardial effusion. Marked severity coronary artery calcification is seen. Mediastinum/Nodes: Lungs/Pleura: Mild areas of atelectasis and/or infiltrate are seen within the left upper lobe and posterior aspect of the left lung base. There is no evidence of a pleural effusion or pneumothorax. Musculoskeletal: Multiple sternal wires are seen. Multilevel degenerative changes are noted throughout the thoracic spine. CT ABDOMEN PELVIS FINDINGS Hepatobiliary: No focal liver  abnormality is seen. Numerous tiny gallstones are seen within the lumen of a partially contracted gallbladder. A very mild amount of pericholecystic inflammation is noted. Pancreas: Unremarkable. No pancreatic ductal dilatation or surrounding inflammatory changes. Spleen: Normal in size without focal abnormality. Adrenals/Urinary Tract: Adrenal glands are unremarkable. Kidneys are normal in size, without renal calculi or hydronephrosis. Small areas of parenchymal calcification and adjacent focal scarring are seen within the lower pole the left kidney. A  2.5 cm x 1.6 cm adjacent area of low attenuation is seen within the posterior aspect of the lower pole on the left (axial CT image 37, CT series number 8). Bladder is unremarkable. Stomach/Bowel: Stomach is within normal limits. The appendix is not identified. No evidence of bowel wall thickening, distention, or inflammatory changes. Vascular/Lymphatic: There is marked severity aortic atherosclerosis. 4.6 cm x 4.5 cm aneurysmal dilatation of the infrarenal abdominal aorta is seen. No enlarged abdominal or pelvic lymph nodes. Reproductive: The prostate gland is mildly enlarged. Other: No abdominal wall hernia or abnormality. No abdominopelvic ascites. Musculoskeletal: Multilevel degenerative changes are seen throughout the lumbar spine. IMPRESSION: 1. Cholelithiasis with additional findings suggestive of acute cholecystitis. 2. 4.6 cm x 4.5 cm infrarenal abdominal aortic aneurysm. 3. Evidence of prior median sternotomy. 4. Artificial aortic valve. 5. Focal scarring and calcification within the lower pole of the left kidney with an adjacent area of low attenuation which may represent a small renal cyst. Correlation with renal ultrasound is recommended. Electronically Signed   By: Virgina Norfolk M.D.   On: 06/22/2019 17:59   NM Hepatobiliary Liver Func  Result Date: 06/25/2019 CLINICAL DATA:  Abdominal pain, fever, chills, nausea, vomiting, jaundice, suspected acute cholecystitis by CT and ultrasound EXAM: NUCLEAR MEDICINE HEPATOBILIARY IMAGING TECHNIQUE: Sequential images of the abdomen were obtained out to 60 minutes following intravenous administration of radiopharmaceutical. RADIOPHARMACEUTICALS:  5.1 mCi Tc-41m  Choletec IV COMPARISON:  Ultrasound abdomen and CT abdomen 06/22/2019 FINDINGS: Normal tracer extraction from bloodstream indicating normal hepatocellular function. Prompt excretion of tracer into biliary tree. Small bowel visualized by 18 minutes. Gallbladder is seen before 1 hour though does knots  appreciably fill with additional tracer over the next 30 minutes of imaging. Lateral view confirms tracer within contracted gallbladder. IMPRESSION: Patent CBD. Minimal tracer within a contracted gallbladder by 90 minutes of imaging indicating a contracted gallbladder with at least partially patent cystic duct. Electronically Signed   By: Lavonia Dana M.D.   On: 06/25/2019 15:16   US Abdomen Limited RUQ  Result Date: 06/22/2019 CLINICAL DATA:  Biliary disease. EXAM: ULTRASOUND ABDOMEN LIMITED RIGHT UPPER QUADRANT COMPARISON:  CT abdomen/pelvis 06/22/2019 FINDINGS: Gallbladder: Cholecystolithiasis. Visualized gallstones measure up to 6 mm. Thick-walled gallbladder measuring 11 mm. Pericholecystic fluid. A sonographic Murphy sign was elicited by the scanning technologist. Common bile duct: Diameter: 4 mm proximally, 5-6 mm distally. Liver: No focal lesion identified. Increased hepatic parenchymal echogenicity. Interrogated portal vein is patent on color Doppler imaging with normal direction of blood flow towards the liver. IMPRESSION: Cholecystolithiasis with gallbladder wall thickening and pericholecystic fluid. A sonographic Murphy sign was elicited by the scanning technologist. This constellation of findings is highly suggestive of acute cholecystitis. Increased hepatic parenchymal echogenicity. While this is a nonspecific finding, increased density of the liver was demonstrated on CT performed earlier the same day. Query changes related to amiodarone therapy in this patient with a history of chronic heart failure. Electronically Signed   By: Kellie Simmering DO   On: 06/22/2019 21:40     Subjective:  He tolerated a diet well. Denies abdominal pain, nausea, or vomiting. Remains afebrile.   Discharge Exam: Vitals:   06/26/19 1145 06/26/19 1614  BP: 102/66 128/65  Pulse: 72 75  Resp: 19 (!) 23  Temp: 98.1 F (36.7 C) 97.7 F (36.5 C)  SpO2: 96% 99%   Vitals:   06/26/19 0437 06/26/19 0828 06/26/19  1145 06/26/19 1614  BP: 119/67 122/62 102/66 128/65  Pulse: 81 73 72 75  Resp: 16 20 19  (!) 23  Temp: 97.6 F (36.4 C) 97.9 F (36.6 C) 98.1 F (36.7 C) 97.7 F (36.5 C)  TempSrc: Oral Oral Oral Oral  SpO2: 98% 99% 96% 99%  Weight: 74.4 kg     Height:        General: Pt is alert, awake, not in acute distress Cardiovascular: RRR, S1/S2 present Respiratory: No respiratory distress, no wheezing, no rhonchi Abdominal: Soft, NT, ND Extremities: no edema, no cyanosis    The results of significant diagnostics from this hospitalization (including imaging, microbiology, ancillary and laboratory) are listed below for reference.     Microbiology: Recent Results (from the past 240 hour(s))  Respiratory Panel by RT PCR (Flu A&B, Covid) - Nasopharyngeal Swab     Status: None   Collection Time: 06/22/19  6:57 PM   Specimen: Nasopharyngeal Swab  Result Value Ref Range Status   SARS Coronavirus 2 by RT PCR NEGATIVE NEGATIVE Final    Comment: (NOTE) SARS-CoV-2 target nucleic acids are NOT DETECTED. The SARS-CoV-2 RNA is generally detectable in upper respiratoy specimens during the acute phase of infection. The lowest concentration of SARS-CoV-2 viral copies this assay can detect is 131 copies/mL. A negative result does not preclude SARS-Cov-2 infection and should not be used as the sole basis for treatment or other patient management decisions. A negative result may occur with  improper specimen collection/handling, submission of specimen other than nasopharyngeal swab, presence of viral mutation(s) within the areas targeted by this assay, and inadequate number of viral copies (<131 copies/mL). A negative result must be combined with clinical observations, patient history, and epidemiological information. The expected result is Negative. Fact Sheet for Patients:  PinkCheek.be Fact Sheet for Healthcare Providers:   GravelBags.it This test is not yet ap proved or cleared by the [place] FDA and  has been authorized for detection and/or diagnosis of SARS-CoV-2 by FDA under an Emergency Use Authorization (EUA). This EUA will remain  in effect (meaning this test can be used) for the duration of the COVID-19 declaration under Section 564(b)(1) of the Act, 21 U.S.C. section 360bbb-3(b)(1), unless the authorization is terminated or revoked sooner.    Influenza A by PCR NEGATIVE NEGATIVE Final   Influenza B by PCR NEGATIVE NEGATIVE Final    Comment: (NOTE) The Xpert Xpress SARS-CoV-2/FLU/RSV assay is intended as an aid in  the diagnosis of influenza from Nasopharyngeal swab specimens and  should not be used as a sole basis for treatment. Nasal washings and  aspirates are unacceptable for Xpert Xpress SARS-CoV-2/FLU/RSV  testing. Fact Sheet for Patients: PinkCheek.be Fact Sheet for Healthcare Providers: GravelBags.it This test is not yet approved or cleared by the [place] FDA and  has been authorized for detection and/or diagnosis of SARS-CoV-2 by  FDA under an Emergency Use Authorization (EUA). This EUA will remain  in effect (meaning this test can be used) for the duration of the  Covid-19 declaration under Section 564(b)(1) of the Act, 21  U.S.C. section 360bbb-3(b)(1), unless the authorization is  terminated or  revoked. Performed at [facility], [place] [address], [place], [place] [address]   Culture, blood (routine x 2)     Status: None (Preliminary result)   Collection Time: 06/22/19  9:28 PM   Specimen: BLOOD RIGHT FOREARM  Result Value Ref Range Status   Specimen Description BLOOD RIGHT FOREARM  Final   Special Requests   Final    BOTTLES DRAWN AEROBIC AND ANAEROBIC Blood Culture adequate volume   Culture   Final    NO GROWTH 4 DAYS Performed at [facility], [place] [address],  [place], [place] [address]    Report Status PENDING  Incomplete  Culture, blood (routine x 2)     Status: None (Preliminary result)   Collection Time: 06/22/19 10:00 PM   Specimen: BLOOD LEFT HAND  Result Value Ref Range Status   Specimen Description BLOOD LEFT HAND  Final   Special Requests   Final    BOTTLES DRAWN AEROBIC AND ANAEROBIC Blood Culture adequate volume   Culture   Final    NO GROWTH 4 DAYS Performed at [facility], [place] [address], [place],  [address]    Report Status PENDING  Incomplete     Labs: BNP (last 3 results) No results for input(s): BNP in the last 8760 hours. Basic Metabolic Panel: Recent Labs  Lab 06/22/19 1601 06/22/19 2038 06/22/19 2216 06/24/19 0338 06/25/19 0647 06/25/19 1057 06/26/19 0441  NA 133*  --  135 136 134*  --  138  K 3.8  --  3.7 3.2* 3.2*  --  3.9  CL 101  --  104 105 104  --  106  CO2 19*  --  19* 19* 22  --  22  GLUCOSE 135*  --  95 67* 100*  --  107*  BUN 26*  --  28* 36* 21  --  16  CREATININE 1.70*  --  1.55* 1.59* 1.17  --  1.17  CALCIUM 8.9  --  8.4* 8.2* 8.2*  --  8.4*  MG  --  1.6*  --   --   --  2.2  --   PHOS  --  4.9*  --   --   --   --   --    Liver Function Tests: Recent Labs  Lab 06/22/19 1601 06/22/19 2216 06/24/19 0338 06/25/19 0647 06/26/19 0441  AST 295* 268* 179* 73* 54*  ALT 257* 242* 207* 133* 110*  ALKPHOS 146* 131* 96 74 84  BILITOT 4.9* 5.2* 2.1* 1.3* 1.2  PROT 6.5 5.9* 5.4* 5.3* 6.2*  ALBUMIN 3.2* 2.8* 2.4* 2.2* 2.5*   Recent Labs  Lab 06/22/19 1601  LIPASE 14   No results for input(s): AMMONIA in the last 168 hours. CBC: Recent Labs  Lab 06/22/19 1601 06/22/19 2216  WBC 8.2 7.3  HGB 13.4 12.3*  HCT 38.3* 34.5*  MCV 123.9* 123.2*  PLT 290 232   Cardiac Enzymes: No results for input(s): CKTOTAL, CKMB, CKMBINDEX, TROPONINI in the last 168 hours. BNP: Invalid input(s): POCBNP CBG: Recent Labs  Lab 06/24/19 0629 06/24/19 0649 06/26/19 0613  GLUCAP 60* 131* 103*    D-Dimer No results for input(s): DDIMER in the last 72 hours. Hgb A1c No results for input(s): HGBA1C in the last 72 hours. Lipid Profile No results for input(s): CHOL, HDL, LDLCALC, TRIG, CHOLHDL, LDLDIRECT in the last 72 hours. Thyroid function studies No results for input(s): TSH, T4TOTAL, T3FREE, THYROIDAB in the last 72 hours.  Invalid input(s): FREET3 Anemia work up No results for input(s): VITAMINB12, FOLATE, FERRITIN, TIBC, IRON, RETICCTPCT in the last 72 hours. Urinalysis    Component Value Date/Time   COLORURINE AMBER (A) 06/23/2019 0145   APPEARANCEUR CLOUDY (A) 06/23/2019 0145   LABSPEC 1.041 (H) 06/23/2019 0145   PHURINE 5.0 06/23/2019 0145   GLUCOSEU >=500 (A) 06/23/2019 0145   HGBUR LARGE (A) 06/23/2019 0145   BILIRUBINUR NEGATIVE 06/23/2019 0145   KETONESUR NEGATIVE 06/23/2019 0145   PROTEINUR 100 (A) 06/23/2019 0145   NITRITE NEGATIVE 06/23/2019 0145   LEUKOCYTESUR NEGATIVE 06/23/2019 0145   Sepsis Labs Invalid input(s): PROCALCITONIN,  WBC,  LACTICIDVEN Microbiology Recent Results (from the past 240 hour(s))  Respiratory Panel by RT PCR (Flu A&B, Covid) - Nasopharyngeal Swab     Status: None   Collection Time: 06/22/19  6:57 PM   Specimen: Nasopharyngeal Swab  Result Value Ref Range Status   SARS Coronavirus 2 by RT PCR NEGATIVE NEGATIVE Final    Comment: (NOTE) SARS-CoV-2 target nucleic acids are NOT DETECTED. The SARS-CoV-2 RNA is generally detectable in upper respiratoy specimens during the acute phase of infection. The lowest concentration of SARS-CoV-2 viral copies this assay can detect is 131 copies/mL. A negative result does not preclude SARS-Cov-2 infection and should not be used as the sole basis for treatment or other patient management decisions. A negative result may occur with  improper specimen collection/handling, submission of specimen other than nasopharyngeal swab, presence of viral mutation(s) within the areas targeted by this  assay, and inadequate number of viral copies (<131 copies/mL). A negative result must be combined with clinical observations, patient history, and epidemiological information. The expected result is Negative. Fact Sheet for Patients:  PinkCheek.be Fact Sheet for Healthcare Providers:  GravelBags.it This test is not yet ap proved or cleared by the [place] FDA and  has been authorized for detection and/or diagnosis of SARS-CoV-2 by FDA under an Emergency Use Authorization (EUA). This EUA will remain  in effect (meaning this test can be used) for the duration of the COVID-19 declaration under Section 564(b)(1) of the Act, 21 U.S.C. section 360bbb-3(b)(1), unless the authorization is terminated or revoked sooner.    Influenza A by PCR NEGATIVE NEGATIVE Final   Influenza B by PCR NEGATIVE NEGATIVE Final    Comment: (NOTE) The Xpert Xpress SARS-CoV-2/FLU/RSV assay is intended as an aid in  the diagnosis of influenza from Nasopharyngeal swab specimens and  should not be used as a sole basis for treatment. Nasal washings and  aspirates are unacceptable for Xpert Xpress SARS-CoV-2/FLU/RSV  testing. Fact Sheet for Patients: PinkCheek.be Fact Sheet for Healthcare Providers: GravelBags.it This test is not yet approved or cleared by the [place] FDA and  has been authorized for detection and/or diagnosis of SARS-CoV-2 by  FDA under an Emergency Use Authorization (EUA). This EUA will remain  in effect (meaning this test can be used) for the duration of the  Covid-19 declaration under Section 564(b)(1) of the Act, 21  U.S.C. section 360bbb-3(b)(1), unless the authorization is  terminated or revoked. Performed at [facility], [place] [address] [place], [place] [address]   Culture, blood (routine x 2)     Status: None (Preliminary result)   Collection Time: 06/22/19   9:28 PM   Specimen: BLOOD RIGHT FOREARM  Result Value Ref Range Status   Specimen Description BLOOD RIGHT FOREARM  Final   Special Requests   Final    BOTTLES DRAWN AEROBIC AND ANAEROBIC Blood  Culture adequate volume   Culture   Final    NO GROWTH 4 DAYS Performed at [facility], [place] [address], [place], [place] [address]    Report Status PENDING  Incomplete  Culture, blood (routine x 2)     Status: None (Preliminary result)   Collection Time: 06/22/19 10:00 PM   Specimen: BLOOD LEFT HAND  Result Value Ref Range Status   Specimen Description BLOOD LEFT HAND  Final   Special Requests   Final    BOTTLES DRAWN AEROBIC AND ANAEROBIC Blood Culture adequate volume   Culture   Final    NO GROWTH 4 DAYS Performed at [facility], [place] [address] [place], [place] [address]    Report Status PENDING  Incomplete     Time coordinating discharge: Over 33 minutes  SIGNED:   Blain Pais, MD  [facility] 06/26/2019, 4:27 PM

## 2019-06-26 NOTE — Progress Notes (Signed)
Physical Therapy Treatment Patient Details Name: Taylor [name] MRN: [id number] DOB: [birth year] Today's Date: 06/26/2019    History of Present Illness Patient is a 69 y/o male who presents with abdominal pain, N/V. CHest CT-cholelithiasis with acute Cholecystitis and elevated LFTs. Admitted with sepsis secondary to acute choleycystitis. Plan for drain placement after clearance from Cardiology. PMH includes HTN, HLD, mechanical valve replacement,ventricular tachycardia, CHF.    PT Comments    Patient is making progress toward PT goals and tolerated increased mobility well. Current plan remains appropriate.    Follow Up Recommendations  Home health PT;Supervision - Intermittent     Equipment Recommendations  None recommended by PT    Recommendations for Other Services       Precautions / Restrictions Precautions Precautions: Fall    Mobility  Bed Mobility Overal bed mobility: Modified Independent Bed Mobility: Supine to Sit           General bed mobility comments: increased effort and use of rail  Transfers Overall transfer level: Needs assistance Equipment used: Rolling walker (2 wheeled) Transfers: Sit to/from Stand Sit to Stand: Supervision         General transfer comment: supervision for safety  Ambulation/Gait Ambulation/Gait assistance: Min guard Gait Distance (Feet): 400 Feet Assistive device: Rolling walker (2 wheeled) Gait Pattern/deviations: Step-through pattern;Decreased stride length;Trunk flexed;Wide base of support Gait velocity: decreased   General Gait Details: cues for maintaining safe proximity to RW and for upright posture; some difficulty turning with RW but no LOB   Stairs             Wheelchair Mobility    Modified Rankin (Stroke Patients Only)       Balance Overall balance assessment: Needs assistance Sitting-balance support: Feet supported;No upper extremity supported Sitting balance-Leahy Scale: Good     Standing  balance support: During functional activity Standing balance-Leahy Scale: Fair                              Cognition Arousal/Alertness: Awake/alert Behavior During Therapy: WFL for tasks assessed/performed Overall Cognitive Status: Within Functional Limits for tasks assessed                                        Exercises      General Comments        Pertinent Vitals/Pain Pain Assessment: No/denies pain    Home Living                      Prior Function            PT Goals (current goals can now be found in the care plan section) Progress towards PT goals: Progressing toward goals    Frequency    Min 3X/week      PT Plan Current plan remains appropriate    Co-evaluation              AM-PAC PT "6 Clicks" Mobility   Outcome Measure  Help needed turning from your back to your side while in a flat bed without using bedrails?: None Help needed moving from lying on your back to sitting on the side of a flat bed without using bedrails?: A Little Help needed moving to and from a bed to a chair (including a wheelchair)?: A Little Help needed standing up from a chair using your  arms (e.g., wheelchair or bedside chair)?: A Little Help needed to walk in hospital room?: A Little Help needed climbing 3-5 steps with a railing? : A Little 6 Click Score: 19    End of Session Equipment Utilized During Treatment: Gait belt Activity Tolerance: Patient tolerated treatment well Patient left: with call bell/phone within reach;Other (comment)(pt sitting EOB eating lunch) Nurse Communication: Mobility status PT Visit Diagnosis: Muscle weakness (generalized) (M62.81)     Time: [id number] PT Time Calculation (min) (ACUTE ONLY): 26 min  Charges:  $Gait Training: 23-37 mins                     Earney Navy, PTA Acute Rehabilitation Services Pager: [phone] Office: [phone]     Darliss Cheney 06/26/2019, 2:29  PM

## 2019-06-26 NOTE — Plan of Care (Signed)
  Problem: Health Behavior/Discharge Planning: Goal: Ability to manage health-related needs will improve Outcome: Progressing   Problem: Clinical Measurements: Goal: Respiratory complications will improve Outcome: Progressing Goal: Cardiovascular complication will be avoided Outcome: Progressing

## 2019-06-27 LAB — CULTURE, BLOOD (ROUTINE X 2)
Culture: NO GROWTH
Culture: NO GROWTH
Special Requests: ADEQUATE
Special Requests: ADEQUATE

## 2019-06-27 NOTE — Progress Notes (Addendum)
F/U call made to [facility] regarding [facility] referral- spoke with Cassie- they had received fax from yesterday- info provided for pt's address at sister's home in [place]. Also faxed pt's updated insurance info that will go into coverage on Feb. 1- per Cassie they can do [place] on Feb. 1- will verify they can accept insurance- otherwise- referral has been accepted.  1230- received a call from Cassie from [facility]- after doing an Manufacturing systems engineer found that they are out of network with pt's insurance that will start on Feb.1- per [facility] appear to be in network. - [facility]- will not be able to provide services - call made to patient to let him know and to offer choice of the 2 agencies that are in network- between the 2 he would like to try [facility] first. Call made to [facility] central intake- [phone]- spoke with [facility] regarding referral- they can possible do [facility] for Feb 1 once insurance has been verified- faxed referral info to [facility] along with pt's insurance info that starts on Feb. 1- they will call pt once verified.

## 2019-07-02 ENCOUNTER — Other Ambulatory Visit: Payer: Self-pay

## 2019-07-02 ENCOUNTER — Ambulatory Visit (INDEPENDENT_AMBULATORY_CARE_PROVIDER_SITE_OTHER): Payer: Medicare Other | Admitting: *Deleted

## 2019-07-02 DIAGNOSIS — Z952 Presence of prosthetic heart valve: Secondary | ICD-10-CM

## 2019-07-02 DIAGNOSIS — I5022 Chronic systolic (congestive) heart failure: Secondary | ICD-10-CM | POA: Diagnosis not present

## 2019-07-02 DIAGNOSIS — Z5181 Encounter for therapeutic drug level monitoring: Secondary | ICD-10-CM

## 2019-07-02 LAB — POCT INR: INR: 2.2 (ref 2.0–3.0)

## 2019-07-02 NOTE — Patient Instructions (Signed)
Will change warfarin to smaller strength tablet when pt runs out of 5mg  tablets for better adjustment. Continue warfarin 1/2 tablet daily Continue greens and salads Recheck in 3 weeks  Coumadin Clinic [phone]

## 2019-07-03 ENCOUNTER — Encounter (HOSPITAL_COMMUNITY): Payer: Self-pay

## 2019-07-03 ENCOUNTER — Ambulatory Visit (HOSPITAL_COMMUNITY)
Admission: RE | Admit: 2019-07-03 | Discharge: 2019-07-03 | Disposition: A | Discharge status: Home / Self Care | Payer: Medicare Other | Source: Ambulatory Visit | Attending: Cardiology | Admitting: Cardiology

## 2019-07-03 VITALS — BP 124/86 | HR 62 | Wt 171.0 lb

## 2019-07-03 DIAGNOSIS — I2510 Atherosclerotic heart disease of native coronary artery without angina pectoris: Secondary | ICD-10-CM | POA: Insufficient documentation

## 2019-07-03 DIAGNOSIS — Z79899 Other long term (current) drug therapy: Secondary | ICD-10-CM | POA: Insufficient documentation

## 2019-07-03 DIAGNOSIS — Z881 Allergy status to other antibiotic agents status: Secondary | ICD-10-CM | POA: Insufficient documentation

## 2019-07-03 DIAGNOSIS — Z8249 Family history of ischemic heart disease and other diseases of the circulatory system: Secondary | ICD-10-CM | POA: Insufficient documentation

## 2019-07-03 DIAGNOSIS — Z951 Presence of aortocoronary bypass graft: Secondary | ICD-10-CM | POA: Diagnosis not present

## 2019-07-03 DIAGNOSIS — R531 Weakness: Secondary | ICD-10-CM | POA: Insufficient documentation

## 2019-07-03 DIAGNOSIS — Z952 Presence of prosthetic heart valve: Secondary | ICD-10-CM | POA: Diagnosis not present

## 2019-07-03 DIAGNOSIS — I255 Ischemic cardiomyopathy: Secondary | ICD-10-CM | POA: Insufficient documentation

## 2019-07-03 DIAGNOSIS — Z95810 Presence of automatic (implantable) cardiac defibrillator: Secondary | ICD-10-CM | POA: Diagnosis not present

## 2019-07-03 DIAGNOSIS — I714 Abdominal aortic aneurysm, without rupture: Secondary | ICD-10-CM | POA: Insufficient documentation

## 2019-07-03 DIAGNOSIS — Z7982 Long term (current) use of aspirin: Secondary | ICD-10-CM | POA: Insufficient documentation

## 2019-07-03 DIAGNOSIS — I252 Old myocardial infarction: Secondary | ICD-10-CM | POA: Diagnosis not present

## 2019-07-03 DIAGNOSIS — Z7984 Long term (current) use of oral hypoglycemic drugs: Secondary | ICD-10-CM | POA: Insufficient documentation

## 2019-07-03 DIAGNOSIS — Z8674 Personal history of sudden cardiac arrest: Secondary | ICD-10-CM | POA: Diagnosis not present

## 2019-07-03 DIAGNOSIS — Z79890 Hormone replacement therapy (postmenopausal): Secondary | ICD-10-CM | POA: Diagnosis not present

## 2019-07-03 DIAGNOSIS — N179 Acute kidney failure, unspecified: Secondary | ICD-10-CM | POA: Insufficient documentation

## 2019-07-03 DIAGNOSIS — Z7901 Long term (current) use of anticoagulants: Secondary | ICD-10-CM | POA: Insufficient documentation

## 2019-07-03 DIAGNOSIS — I110 Hypertensive heart disease with heart failure: Secondary | ICD-10-CM | POA: Diagnosis not present

## 2019-07-03 DIAGNOSIS — I5022 Chronic systolic (congestive) heart failure: Secondary | ICD-10-CM | POA: Diagnosis not present

## 2019-07-03 DIAGNOSIS — Z87891 Personal history of nicotine dependence: Secondary | ICD-10-CM | POA: Diagnosis not present

## 2019-07-03 DIAGNOSIS — E785 Hyperlipidemia, unspecified: Secondary | ICD-10-CM | POA: Insufficient documentation

## 2019-07-03 LAB — BASIC METABOLIC PANEL
Anion gap: 11 (ref 5–15)
BUN: 10 mg/dL (ref 8–23)
CO2: 22 mmol/L (ref 22–32)
Calcium: 8.5 mg/dL — ABNORMAL LOW (ref 8.9–10.3)
Chloride: 102 mmol/L (ref 98–111)
Creatinine, Ser: 1.09 mg/dL (ref 0.61–1.24)
GFR calc Af Amer: >60 mL/min (ref >60)
GFR calc non Af Amer: >60 mL/min (ref >60)
Glucose, Bld: 95 mg/dL (ref 70–99)
Potassium: 3.7 mmol/L (ref 3.5–5.1)
Sodium: 135 mmol/L (ref 135–145)

## 2019-07-03 NOTE — Patient Instructions (Signed)
Lab work done today. We will notify you of any abnormal lab work. No news is good news!  Please follow up with the Advanced Heart Failure Clinic in 2-3 months.  At the Advanced Heart Failure Clinic, you and your health needs are our priority. As part of our continuing mission to provide you with exceptional heart care, we have created designated Provider Care Teams. These Care Teams include your primary Cardiologist (physician) and Advanced Practice Providers (APPs- Physician Assistants and Nurse Practitioners) who all work together to provide you with the care you need, when you need it.   You may see any of the following providers on your designated Care Team at your next follow up: . Dr Daniel Bensimhon . Dr Dalton McLean . Amy Clegg, NP . Brittainy Simmons, PA . Lauren Kemp, PharmD   Please be sure to bring in all your medications bottles to every appointment.    

## 2019-07-03 NOTE — Progress Notes (Signed)
Advanced Heart Failure Clinic Note   Referring Physician: PCP: Michell Heinrich, DO PCP-Cardiologist: Dr. Haroldine Laws   HPI:  Taylor [name] is seen today for evaluation of preoperative risk assessment for potential cholecystomy for acute cholecystitis,  at the request of [place] Silos, General Surgery.   Taylor Durhamis a 69 y.o.malewith mechnical AVR on  chronic coumadin, h/o VF arrest s/p ICD , CAD s/p CABG, chronic systolic HF (EF 0000000), AAA and h/o essential thrombosis.   Has h/o congenital AoV abnormality with AI. In 1994 had MI and underwent PCI (unclear artery) and mechanical AVR at [facility].   Has h/o polysubstance abuse with cocaine, ETOH and tobacco abuse. No longer doing cocaine or smoking. Now drinks about 2 beers per day.  Did well from heart perspective until 2017, when he had VF arrest on 05/18/16. Found to have 3v CAD and MV disease and underwent CABG x 3 with mechanical AVR. Post-operatively had AF (treated with amio) and CHB requiring implantation of a BosSci CRT-D device.This is followed by Dr. Caryl Comes. Coumadin Clinic at [facility] follows INR. INR goal 2.0-3.0. PCP in [place] follows lipids.  Echo on 12/18 showed LVEF of 25-30% stable AVR  Had CT on 12/18 for his back. Showed 3.7cm AAA.Had f/u US 05/2018 that showedmildincrease in size of the known distal fusiform abdominal aortic aneurysm, measuring 4.2 cm in maximum diameter. He was referred to [facility] and he is now followed by Dr. Oneida Alar.   Most recent echo 02/2019 showed EF 20-25%. RV normal. At last [facility] visit in 02/2019, he was stable from CHF symptom standpoint, but has been intolerant to bid Entresto dosing due to dizziness. Only takes once daily. Wilder Glade was added to his HF regimen last visit.   He was admitted to [facility] on 06/19/19 w/ acute cholecystitis and sepsis. Hypotensive on admit w/ AKI and HF meds held. SCr bumped to 1.7 (baseline 1.1). Treated w/ IVFs and abx. Blood cultures negative. General surgery  consulted and plans were for percutaneous cholecystostomy w/ drain, however HIDA scan was negative and he improved symptomatically w/ conservative therapy. His BP and renal function also improved and HF meds restarted Delene Loll, Farxiga and Coreg; Spironolactone held). He remained stable from a cardiac/ HF standpoint. General surgery recommended continuation of abx (Cipro + Flagyl x 10 days) and outpatient f/u.  He may eventually need lap chole.   He presents to clinic today for f/u. BP 124/86. His wt is 171 lb, c/w dry wt. Has felt weak since discharge. Loss of appetite. Thinks may be related to abx. Still has 4 days left. Starting drinking Boost for nutrition which has helped. Starting to feel a bit better. Had INR check yesterday and was 2.1. No further abdominal pain. Denies fever and chills. F/u with gen surgery is 2/17.   He has remained stable from a HF standpoint. No cardiac symptoms. Denies dyspnea. No wt gain, no LEE, orthopnea/PND. No chest pain. No syncope/ near syncope.     Review of Systems: [y] = yes, [ ]  = no   General: Weight gain [ ] ; Weight loss [ ] ; Anorexia [ ] ; Fatigue [ ] ; Fever [ ] ; Chills [ ] ; Weakness [ ]   Cardiac: Chest pain/pressure [ ] ; Resting SOB [ ] ; Exertional SOB [ ] ; Orthopnea [ ] ; Pedal Edema [ ] ; Palpitations [ ] ; Syncope [ ] ; Presyncope [ ] ; Paroxysmal nocturnal dyspnea[ ]   Pulmonary: Cough [ ] ; Wheezing[ ] ; Hemoptysis[ ] ; Sputum [ ] ; Snoring [ ]   GI: Vomiting[ ] ; Dysphagia[ ] ; Melena[ ] ;  Hematochezia [ ] ; Heartburn[ ] ; Abdominal pain [ ] ; Constipation [ ] ; Diarrhea [ ] ; BRBPR [ ]   GU: Hematuria[ ] ; Dysuria [ ] ; Nocturia[ ]   Vascular: Pain in legs with walking [ ] ; Pain in feet with lying flat [ ] ; Non-healing sores [ ] ; Stroke [ ] ; TIA [ ] ; Slurred speech [ ] ;  Neuro: Headaches[ ] ; Vertigo[ ] ; Seizures[ ] ; Paresthesias[ ] ;Blurred vision [ ] ; Diplopia [ ] ; Vision changes [ ]   Ortho/Skin: Arthritis [ ] ; Joint pain [ ] ; Muscle pain [ ] ; Joint swelling [ ] ; Back  Pain [ ] ; Rash [ ]   Psych: Depression[ ] ; Anxiety[ ]   Heme: Bleeding problems [ ] ; Clotting disorders [ ] ; Anemia [ ]   Endocrine: Diabetes [ ] ; Thyroid dysfunction[ ]    Past Medical History:  Diagnosis Date  . Atherosclerotic heart disease of native coronary artery without angina pectoris   . Chronic anticoagulation 02/18/2018  . Chronic systolic heart failure ([place]) 02/02/2017  . Essential thrombocythemia ([place]) 02/15/2017  . Hyperlipidemia   . Hypertension   . Ischemic cardiomyopathy   . Leg pain, central, left 04/17/2017   Hx sciatica - pain similar; no red flag signs on hx or exam   . Presence of other heart-valve replacement   . Ventricular tachycardia ([place])     Current Outpatient Medications  Medication Sig Dispense Refill  . amiodarone (PACERONE) 200 MG tablet TAKE 1 TABLET(200 MG) BY MOUTH DAILY (Patient taking differently: Take 200 mg by mouth daily. ) 30 tablet 11  . aspirin 81 MG tablet Take 1 tablet by mouth.    Marland Kitchen atorvastatin (LIPITOR) 40 MG tablet Take 1 tablet (40 mg total) by mouth at bedtime. 90 tablet 1  . carvedilol (COREG) 3.125 MG tablet Take 1 tablet (3.125 mg total) by mouth 2 (two) times daily with a meal. 60 tablet 0  . ciprofloxacin (CIPRO) 500 MG tablet Take 1 tablet (500 mg total) by mouth 2 (two) times daily for 10 days. 20 tablet 0  . dapagliflozin propanediol (FARXIGA) 10 MG TABS tablet Take 10 mg by mouth daily.    Marland Kitchen docusate sodium (COLACE) 100 MG capsule Take 1 capsule (100 mg total) by mouth 2 (two) times daily. 10 capsule 0  . hydroxyurea (HYDREA) 500 MG capsule TAKE 1 CAPSULE BY MOUTH ALTERNATING WITH 2 CAPSULES DAILY(500 MG ONE DAY AND 1000 MG NEXT DAY). (Patient taking differently: Take 500 mg by mouth See admin instructions. TAKE 1 CAPSULE BY MOUTH ALTERNATING WITH 2 CAPSULES DAILY(500 MG ONE DAY AND 1000 MG NEXT DAY).) 135 capsule 3  . levothyroxine (SYNTHROID) 175 MCG tablet Take 175 mcg by mouth daily before breakfast.    . metroNIDAZOLE (FLAGYL)  500 MG tablet Take 1 tablet (500 mg total) by mouth 3 (three) times daily for 10 days. 30 tablet 0  . ondansetron (ZOFRAN) 4 MG tablet Take 1 tablet (4 mg total) by mouth every 6 (six) hours as needed for nausea. 30 tablet 0  . polyethylene glycol (MIRALAX / GLYCOLAX) 17 g packet Take 17 g by mouth daily as needed for mild constipation. 14 each 0  . sacubitril-valsartan (ENTRESTO) 24-26 MG Take 1 tablet by mouth daily. 30 tablet 3  . warfarin (COUMADIN) 5 MG tablet Take as directed by coumadin clinic (Patient taking differently: Take 2.5 mg by mouth every evening. Take as directed by coumadin clinic) 30 tablet 3   No current facility-administered medications for this encounter.    Allergies  Allergen Reactions  . Erythromycin Rash  .  Atorvastatin Calcium Other (See Comments)  . Rosuvastatin Calcium     Unknown  . Simvastatin     Unknown  . Zithromax [Azithromycin] Rash      Social History   Socioeconomic History  . Marital status: Married    Spouse name: Not on file  . Number of children: Not on file  . Years of education: Not on file  . Highest education level: Not on file  Occupational History  . Not on file  Tobacco Use  . Smoking status: Former Smoker    Packs/day: 1.00    Years: 30.00    Pack years: 30.00    Types: Cigarettes    Start date: 05/26/1979    Quit date: 05/18/2016    Years since quitting: 3.1  . Smokeless tobacco: Never Used  Substance and Sexual Activity  . Alcohol use: Yes    Alcohol/week: 14.0 standard drinks    Types: 14 Cans of beer per week    Comment: 2 cans a day..  . Drug use: No  . Sexual activity: Not on file  Other Topics Concern  . Not on file  Social History Narrative  . Not on file   Social Determinants of Health   Financial Resource Strain:   . Difficulty of Paying Living Expenses: Not on file  Food Insecurity:   . Worried About Charity fundraiser in the Last Year: Not on file  . Ran Out of Food in the Last Year: Not on file   Transportation Needs:   . Lack of Transportation (Medical): Not on file  . Lack of Transportation (Non-Medical): Not on file  Physical Activity:   . Days of Exercise per Week: Not on file  . Minutes of Exercise per Session: Not on file  Stress:   . Feeling of Stress : Not on file  Social Connections:   . Frequency of Communication with Friends and Family: Not on file  . Frequency of Social Gatherings with Friends and Family: Not on file  . Attends Religious Services: Not on file  . Active Member of Clubs or Organizations: Not on file  . Attends Archivist Meetings: Not on file  . Marital Status: Not on file  Intimate Partner Violence:   . Fear of Current or Ex-Partner: Not on file  . Emotionally Abused: Not on file  . Physically Abused: Not on file  . Sexually Abused: Not on file      Family History  Problem Relation Age of Onset  . Hypertension Father     Vitals:   07/03/19 0930  BP: 124/86  Pulse: 62  SpO2: 98%  Weight: 77.6 kg (171 lb)     PHYSICAL EXAM: General:  Well appearing. No respiratory difficulty HEENT: normal Neck: supple. no JVD. Carotids 2+ bilat; no bruits. No lymphadenopathy or thyromegaly appreciated. Cor: PMI nondisplaced. Regular rate & rhythm. Crisp mechanical valve sounds  Lungs: clear Abdomen: soft, nontender, RUQ nontender, nondistended. No hepatosplenomegaly. No bruits or masses. Good bowel sounds. Extremities: no cyanosis, clubbing, rash, edema Neuro: alert & oriented x 3, cranial nerves grossly intact. moves all 4 extremities w/o difficulty. Affect pleasant.  ECG: not performed   ASSESSMENT & PLAN:  1. Chronic Systolic Heart Failure: -Echo 05/2016 @ [facility], EF 40% -Echo 04/2017 EF 25-30% -Echo 04/2019 EF 20-25%. Diffuse hypokinesis, No LVH. RV normal. Mild AI, trace MR - s/p CRT-D.  - NYHA Class II. Euvolemic - Continue Entresto 24-26 (takes once daily, unable to tolerate BID dosing  due to dizziness) - Continue Farxiga 10  mg daily - Continue Coreg 3.125 mg bid.  - Spironolactone held on d/c due to resolving AKI. Will repeat BMP today. If SCr normalized, will resume.   2. Cholelithiasis: recent admission for acute cholecystitis/ sepsis. HIDA scan negative. Improved w/ conservative therapy w/ abx and bowel rest. No indication for immediate surgery/ or perc drain - he remains on PO abx, Cipro + Flagyl. Pain free. No fever or chills.  - Plan f/u w/ general surgery on 2/17. May ultimately need lap chole - From a cardiac/ HF standpoint, he may proceed w/ surgery if need. HF stable and no s/s angina. Able to complete > 4 METs of physical activity w/o ischemic CP.  - recommend continuation of ASA, statin and  blocker during perioperative period  - Will need perioperative bridging w/ Lovenox for mechanical AV  3. CAD: - h/o remote PCI in 1994 at [place] - s/p LHC in 2017 in the setting of Vfib Arrest w/ 3V CAD treated w/ CABG x 3 - no s/s angina. Able to complete > 4 METs w/o symptoms  - continue ASA, statin and  blocker.  4. Mechanical Aortic Valve: - crips mechanical valve sounds on exam. No chest pain or dyspnea - on coumadin. INRs followed by [place] need bridge w/ Lovenox if lap chole  5. H/o VF Arrest and H/o CHB: - s/p BosSci CRT-D device, followed by EP (Dr. Caryl Comes) - on PO amiodarone 200 mg daily   6. AAA:  - slight size progression on recent CT scan 1/21, now measuring at 4.6 x 4.5 cm (nonruptured) - followed by [facility], Dr. Oneida Alar - BP and HR controlled w/  blocker - on statin therapy   7. AKI:  - recent AKI during admit for sepsis/ acute cholecystitis. SCr peaked to 1.7 (baseline ~1.1) - Spironolactone held on d/c - Repeat BMP today. If SCr normalized, will resume spironolactone.   F/u with Dr. Haroldine Laws in 2-3 months    Lyda Jester, PA-C 07/03/19

## 2019-07-04 ENCOUNTER — Telehealth (HOSPITAL_COMMUNITY): Payer: Self-pay | Admitting: Pharmacist

## 2019-07-04 ENCOUNTER — Telehealth (HOSPITAL_COMMUNITY): Payer: Self-pay

## 2019-07-04 MED ORDER — SPIRONOLACTONE 25 MG PO TABS: 12.5000 mg | ORAL_TABLET | Freq: Every day | ORAL | 3 refills | Status: DC

## 2019-07-04 NOTE — Telephone Encounter (Signed)
Pt aware of lab work and med changes. Orders sent. Pt asked if Dr. Haroldine Laws would send in his sleep medication: triazolam. Pt stated that "Dr. Haroldine Laws said he would refill it if I told him the name"

## 2019-07-04 NOTE — Telephone Encounter (Signed)
I would prefer his PCP refill his sleep med.

## 2019-07-04 NOTE — Telephone Encounter (Signed)
Received fax from [employer] stating Mr. [place]'s application was incomplete. Resent in patient portion of application, as well as insurance, as requested.   Audry Riles, PharmD, BCPS, BCCP, CPP Heart Failure Clinic Pharmacist [phone]

## 2019-07-04 NOTE — Telephone Encounter (Signed)
[employer] states they have not received the patient portion of the application.  Will follow up.  Charlann Boxer, CPhT

## 2019-07-04 NOTE — Telephone Encounter (Signed)
-----   Message from [place], [place] sent at 07/03/2019  4:25 PM EST ----- Creatine back to normal. Restart spironolactone 12.5 mg daily. See if he can get repeat BMP when he goes in for INR check in [place] on 2/9.

## 2019-07-08 ENCOUNTER — Ambulatory Visit (INDEPENDENT_AMBULATORY_CARE_PROVIDER_SITE_OTHER): Payer: Medicare Other | Admitting: *Deleted

## 2019-07-08 ENCOUNTER — Other Ambulatory Visit: Payer: Self-pay

## 2019-07-08 DIAGNOSIS — Z952 Presence of prosthetic heart valve: Secondary | ICD-10-CM

## 2019-07-08 DIAGNOSIS — I5022 Chronic systolic (congestive) heart failure: Secondary | ICD-10-CM

## 2019-07-08 DIAGNOSIS — Z5181 Encounter for therapeutic drug level monitoring: Secondary | ICD-10-CM | POA: Diagnosis not present

## 2019-07-08 LAB — POCT INR: INR: 2.3 (ref 2.0–3.0)

## 2019-07-08 NOTE — Patient Instructions (Signed)
Will change warfarin to smaller strength tablet when pt runs out of 5mg  tablets for better adjustment. Continue warfarin 1/2 tablet daily Continue greens and salads Recheck in 3 weeks  Coumadin Clinic [phone]

## 2019-07-11 NOTE — Telephone Encounter (Signed)
Spoke to [employer], looks like the portion we sent over is still in their fax queue. Will check next week.  Charlann Boxer, CPhT

## 2019-07-22 ENCOUNTER — Other Ambulatory Visit (HOSPITAL_COMMUNITY): Payer: Self-pay | Admitting: *Deleted

## 2019-07-22 MED ORDER — CARVEDILOL 3.125 MG PO TABS: 3.1250 mg | ORAL_TABLET | Freq: Two times a day (BID) | ORAL | 3 refills | Status: DC

## 2019-07-30 ENCOUNTER — Ambulatory Visit (INDEPENDENT_AMBULATORY_CARE_PROVIDER_SITE_OTHER): Payer: Medicare Other | Admitting: *Deleted

## 2019-07-30 ENCOUNTER — Other Ambulatory Visit: Payer: Self-pay

## 2019-07-30 DIAGNOSIS — I5022 Chronic systolic (congestive) heart failure: Secondary | ICD-10-CM

## 2019-07-30 DIAGNOSIS — Z952 Presence of prosthetic heart valve: Secondary | ICD-10-CM

## 2019-07-30 DIAGNOSIS — Z5181 Encounter for therapeutic drug level monitoring: Secondary | ICD-10-CM | POA: Diagnosis not present

## 2019-07-30 LAB — POCT INR: INR: 2.7 (ref 2.0–3.0)

## 2019-07-30 NOTE — Patient Instructions (Signed)
Will change warfarin to smaller strength tablet when pt runs out of 5mg  tablets for better adjustment. Continue warfarin 1/2 tablet daily Continue greens and salads Recheck in 4 weeks  Coumadin Clinic [phone]

## 2019-07-31 ENCOUNTER — Telehealth (HOSPITAL_COMMUNITY): Payer: Self-pay | Admitting: Pharmacist

## 2019-07-31 NOTE — Telephone Encounter (Signed)
Advanced Heart Failure Patient Advocate Encounter   Patient was approved to receive Entresto from [employer].  Patient ID: [id number] Effective dates: 07/31/19 through 05/28/20  Audry Riles, PharmD, BCPS, BCCP, CPP Heart Failure Clinic Pharmacist [phone]

## 2019-08-04 DIAGNOSIS — I255 Ischemic cardiomyopathy: Secondary | ICD-10-CM | POA: Insufficient documentation

## 2019-08-04 DIAGNOSIS — Z95810 Presence of automatic (implantable) cardiac defibrillator: Secondary | ICD-10-CM | POA: Insufficient documentation

## 2019-08-05 ENCOUNTER — Encounter: Payer: Self-pay | Admitting: Internal Medicine

## 2019-08-05 ENCOUNTER — Other Ambulatory Visit: Payer: Self-pay

## 2019-08-05 ENCOUNTER — Ambulatory Visit: Payer: Medicare Other | Admitting: Internal Medicine

## 2019-08-05 VITALS — BP 132/70 | HR 64 | Ht 69.0 in | Wt 163.0 lb

## 2019-08-05 DIAGNOSIS — Z79899 Other long term (current) drug therapy: Secondary | ICD-10-CM

## 2019-08-05 DIAGNOSIS — I480 Paroxysmal atrial fibrillation: Secondary | ICD-10-CM | POA: Diagnosis not present

## 2019-08-05 DIAGNOSIS — E039 Hypothyroidism, unspecified: Secondary | ICD-10-CM

## 2019-08-05 DIAGNOSIS — Z95810 Presence of automatic (implantable) cardiac defibrillator: Secondary | ICD-10-CM

## 2019-08-05 DIAGNOSIS — I5022 Chronic systolic (congestive) heart failure: Secondary | ICD-10-CM | POA: Diagnosis not present

## 2019-08-05 DIAGNOSIS — I255 Ischemic cardiomyopathy: Secondary | ICD-10-CM | POA: Diagnosis not present

## 2019-08-05 MED ORDER — AMIODARONE HCL 200 MG PO TABS: 100.0000 mg | ORAL_TABLET | Freq: Every day | ORAL | 11 refills | Status: DC

## 2019-08-05 NOTE — Progress Notes (Signed)
Patient Care Team: Michell Heinrich, DO as PCP - General (Family Medicine) Annia Belt, MD as Consulting Physician (Oncology) Bensimhon, Shaune Pascal, MD as Consulting Physician (Cardiology)   HPI  Taylor [name] is a 69 y.o. male seen in follow-up for VF arrest 12/17.  He has a history of a prior mechanical aortic valve replacement.  Following his arrest he underwent CABG and mechanical mitral valve replacement. Not unsurprisingly he ended up with postoperative CHB and underwent CRT-D implantation.  He has a history of congenital aortic malformation with aortic insufficiency.  History of ischemic cardiomyopathy with prior MI and prior PCI.  Prior history of polysubstance abuse.  Drinks 2 beers a day.  On amiodarone for freq PVCs started at [facility].  DATE TEST EF   1/18    Echo  40 %    12/18 Echo  25-30%          Date Cr K Hgb TSH LFTs  1/20 1.19 3.8 13.1 21.5 23  2/21  1.09 3.7   110(1/21)   The patient denies chest pain, shortness of breath, nocturnal dyspnea, orthopnea or peripheral edema.  There have been no palpitations, lightheadedness or syncope.    Records and Results Reviewed   Past Medical History:  Diagnosis Date  . Atherosclerotic heart disease of native coronary artery without angina pectoris   . Chronic anticoagulation 02/18/2018  . Chronic systolic heart failure ([place]) 02/02/2017  . Essential thrombocythemia ([place]) 02/15/2017  . Hyperlipidemia   . Hypertension   . Ischemic cardiomyopathy   . Leg pain, central, left 04/17/2017   Hx sciatica - pain similar; no red flag signs on hx or exam   . Presence of other heart-valve replacement   . Ventricular tachycardia [facility])     Past Surgical History:  Procedure Laterality Date  . BIV ICD INSERTION CRT-D    . CORONARY ARTERY BYPASS GRAFT  2017  . MECHANICAL AORTIC VALVE REPLACEMENT      Current Meds  Medication Sig  . amiodarone (PACERONE) 200 MG tablet TAKE 1 TABLET(200 MG) BY MOUTH DAILY    . aspirin 81 MG tablet Take 1 tablet by mouth.  Marland Kitchen atorvastatin (LIPITOR) 40 MG tablet Take 1 tablet (40 mg total) by mouth at bedtime.  . carvedilol (COREG) 3.125 MG tablet Take 1 tablet (3.125 mg total) by mouth 2 (two) times daily with a meal.  . dapagliflozin propanediol (FARXIGA) 10 MG TABS tablet Take 10 mg by mouth daily.  . hydroxyurea (HYDREA) 500 MG capsule TAKE 1 CAPSULE BY MOUTH ALTERNATING WITH 2 CAPSULES DAILY(500 MG ONE DAY AND 1000 MG NEXT DAY).  Marland Kitchen levothyroxine (SYNTHROID) 175 MCG tablet Take 175 mcg by mouth daily before breakfast.  . sacubitril-valsartan (ENTRESTO) 24-26 MG Take 1 tablet by mouth daily.  Marland Kitchen spironolactone (ALDACTONE) 25 MG tablet Take 0.5 tablets (12.5 mg total) by mouth daily.  Marland Kitchen warfarin (COUMADIN) 5 MG tablet Take as directed by coumadin clinic (Patient taking differently: Take 2.5 mg by mouth every evening. Take as directed by coumadin clinic)    Allergies  Allergen Reactions  . Erythromycin Rash  . Atorvastatin Calcium Other (See Comments)  . Rosuvastatin Calcium     Unknown  . Simvastatin     Unknown  . Zithromax [Azithromycin] Rash      Review of Systems negative except from HPI and PMH  Physical Exam BP 132/70   Pulse 64   Ht 5\' 9"  (1.753 m)   Wt 163 lb (73.9 kg)  SpO2 96%   BMI 24.07 kg/m  Well developed and well nourished in no acute distress HENT normal Neck supple with JVP-flat Clear Device pocket well healed; without hematoma or erythema.  There is no tethering  Regular rate and rhythm, no  murmur Abd-soft with active BS No Clubbing cyanosis  edema Skin-warm and dry A & Oriented  Grossly normal sensory and motor function  ECG sinus @ 64 16/16/54 Upright QRS V1  Lead 1 rsr   Assessment and  Plan Ischemic cardiomyopathy status post CABG  Aborted cardiac arrest  Valvular heart disease status post mechanical aortic/mitral valve replacement  ICD-[employer]-CRT-D The patient's device was interrogated and the  information was fully reviewed.  The device was reprogrammed to inactivate rate response and decrease resting rate to 50  PVCs  Complete Heart Block  Amiodarone therapy  Hypothyroidism  Hypertransaminasemia   Thyroid and LFTs being followed by PCP in [place]-- had GB issue in Jan which is presumably the explanation-- have asked him to arrange that being forwarded to [place]  PVC count is down, but histograms show increased increased since beats at higher rates at a level greater than the atrial rate and in the presence of complete heart block suggest that her he has PVCs are been under counted there have been classified as A Sensed V sensed events  Not withstanding, we will decrease his amiodarone as his overall ventricular pacing percentage is considerably better.  100 mg a day.  Euvolemic continue current meds      Current medicines are reviewed at length with the patient today .  The patient does not  have concerns regarding medicines.

## 2019-08-05 NOTE — Patient Instructions (Addendum)
Medication Instructions:  Your physician has recommended you make the following change in your medication:  Decrease your Amiodarone to 100mg  daily (1/2 tablet by mouth daily.   Labwork: None ordered.    Testing/Procedures: None ordered.  Follow-Up: Your physician wants you to follow-up in: 6 months with Dr Caryl Comes. You will receive a reminder letter in the mail two months in advance. If you don't receive a letter, please call our office to schedule the follow-up appointment.  Remote monitoring is used to monitor your Pacemaker of ICD from home. This monitoring reduces the number of office visits required to check your device to one time per year. It allows [place] to keep an eye on the functioning of your device to ensure it is working properly.  Any Other Special Instructions Will Be Listed Below (If Applicable). Office Fax number [phone]  If you need a refill on your cardiac medications before your next appointment, please call your pharmacy.

## 2019-08-07 ENCOUNTER — Ambulatory Visit (INDEPENDENT_AMBULATORY_CARE_PROVIDER_SITE_OTHER): Payer: Medicare Other | Admitting: *Deleted

## 2019-08-07 DIAGNOSIS — I5022 Chronic systolic (congestive) heart failure: Secondary | ICD-10-CM

## 2019-08-07 LAB — CUP PACEART REMOTE DEVICE CHECK
Battery Remaining Longevity: 90 mo
Battery Remaining Percentage: 100 %
Brady Statistic RA Percent Paced: 0 %
Brady Statistic RV Percent Paced: 91 %
Date Time Interrogation Session: 20210311001800
HighPow Impedance: 70 Ohm
Implantable Lead Implant Date: 20180105
Implantable Lead Implant Date: 20180105
Implantable Lead Implant Date: 20180105
Implantable Lead Location: 753858
Implantable Lead Location: 753859
Implantable Lead Location: 753860
Implantable Lead Model: 292
Implantable Lead Model: 4672
Implantable Lead Model: 7741
Implantable Lead Serial Number: 428973
Implantable Lead Serial Number: 801088
Implantable Lead Serial Number: 849519
Implantable Pulse Generator Implant Date: 20180105
Lead Channel Impedance Value: 582 Ohm
Lead Channel Impedance Value: 698 Ohm
Lead Channel Impedance Value: 727 Ohm
Lead Channel Pacing Threshold Amplitude: 0.7 V
Lead Channel Pacing Threshold Amplitude: 0.8 V
Lead Channel Pacing Threshold Amplitude: 1 V
Lead Channel Pacing Threshold Pulse Width: 0.4 ms
Lead Channel Pacing Threshold Pulse Width: 0.4 ms
Lead Channel Pacing Threshold Pulse Width: 1 ms
Lead Channel Setting Pacing Amplitude: 2 V
Lead Channel Setting Pacing Amplitude: 2 V
Lead Channel Setting Pacing Amplitude: 2.2 V
Lead Channel Setting Pacing Pulse Width: 0.4 ms
Lead Channel Setting Pacing Pulse Width: 1 ms
Lead Channel Setting Sensing Sensitivity: 0.6 mV
Lead Channel Setting Sensing Sensitivity: 1 mV
Pulse Gen Serial Number: 180139

## 2019-08-07 NOTE — Progress Notes (Signed)
ICD Remote  

## 2019-08-13 IMAGING — CT CT L SPINE W/ CM
3 series · 10 of 33 positions shown, 12 images · IV contrast (iopamidol)
Comparison: None.

CLINICAL DATA: Low back pain.  LEFT leg pain.

EXAM:
CT LUMBAR SPINE WITH CONTRAST
TECHNIQUE: Multidetector CT imaging of the lumbar spine was performed with
intravenous contrast administration.
CONTRAST:  75mL PRDOV3-ANN IOPAMIDOL (PRDOV3-ANN) INJECTION 61%

[Series 5: l spine 2.0 st · axial · 0.34mm/px · z∈[-267,-111]mm · 2 of 170 slices shown, 3 images]
[im 53/170  soft-tissue]
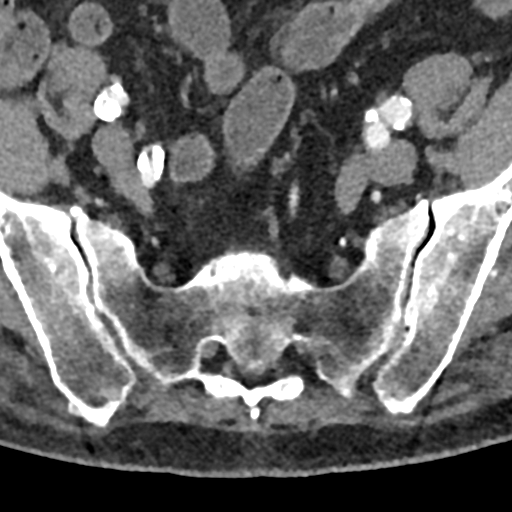
[im 53/170  bone]
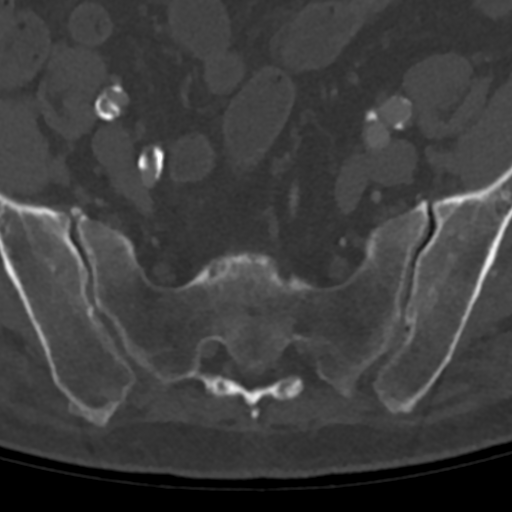
[im 131/170  bone]
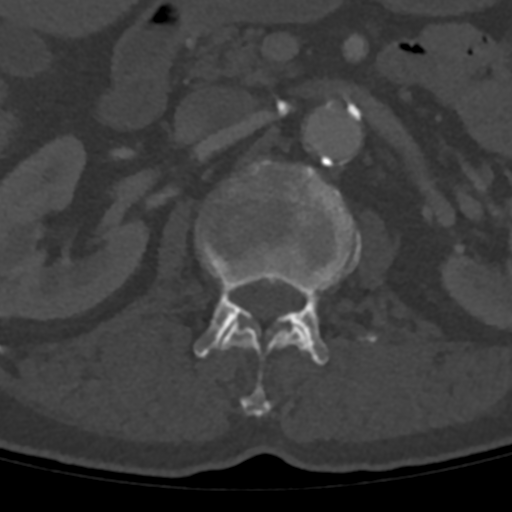

[Series 8: coronal st · coronal · 0.35mm/px · 3 of 85 slices shown]
[im 17/85  bone]
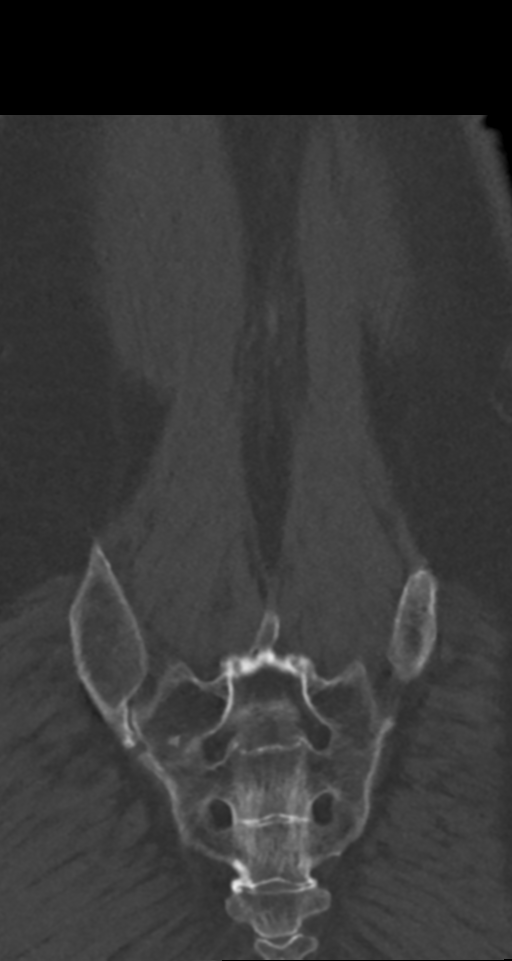
[im 34/85  bone]
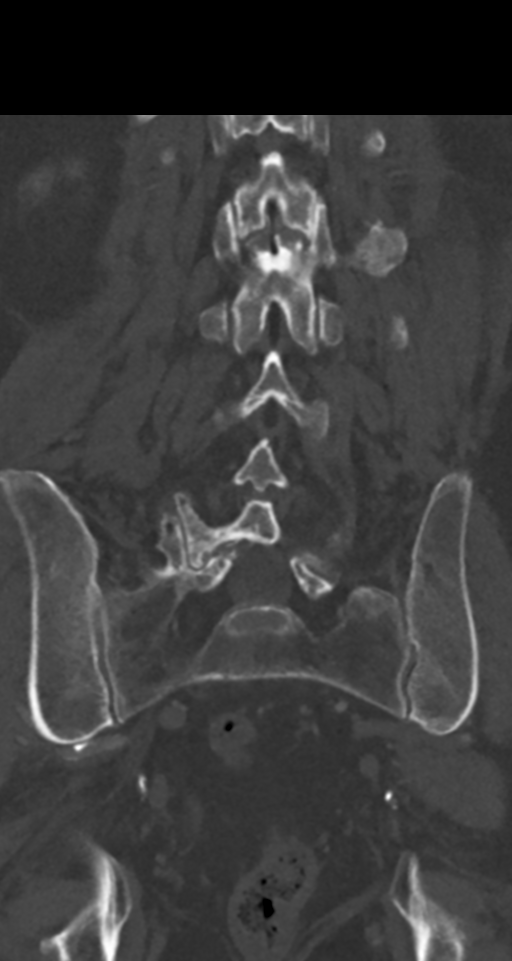
[im 51/85  bone]
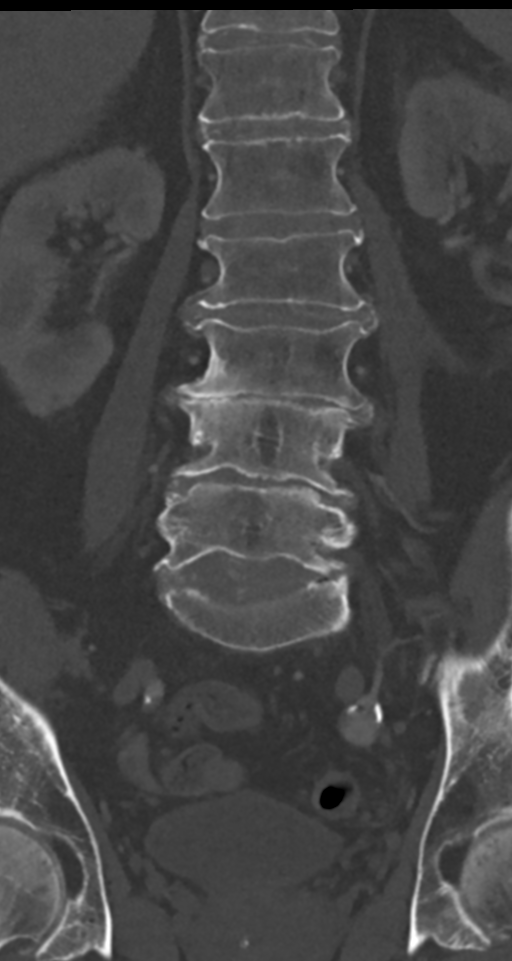

[Series 9: sagittal st · sagittal · 0.38mm/px · 5 of 92 slices shown, 6 images]
[im 31/92  bone]
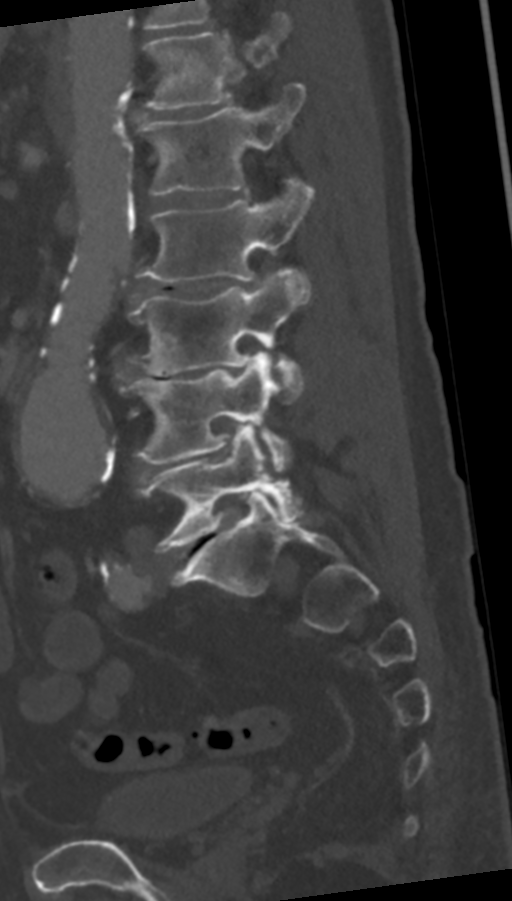
[im 38/92  bone]
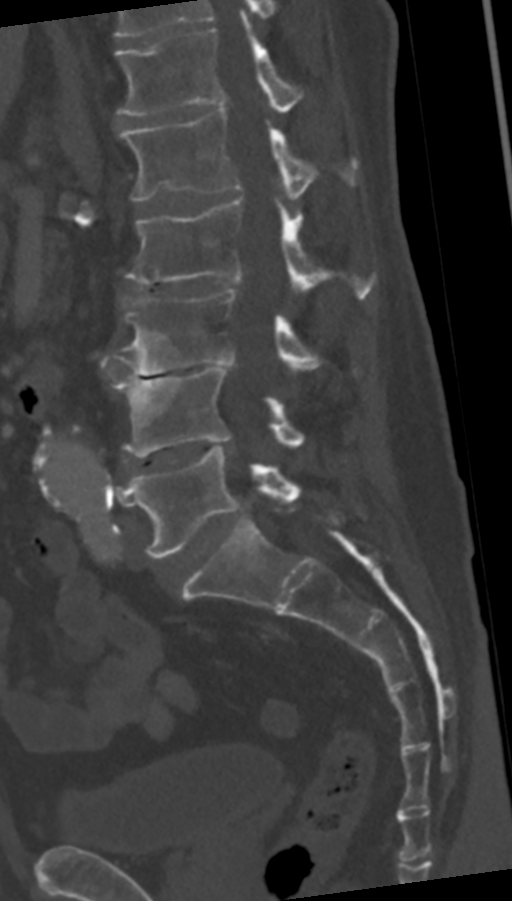
[im 46/92  soft-tissue]
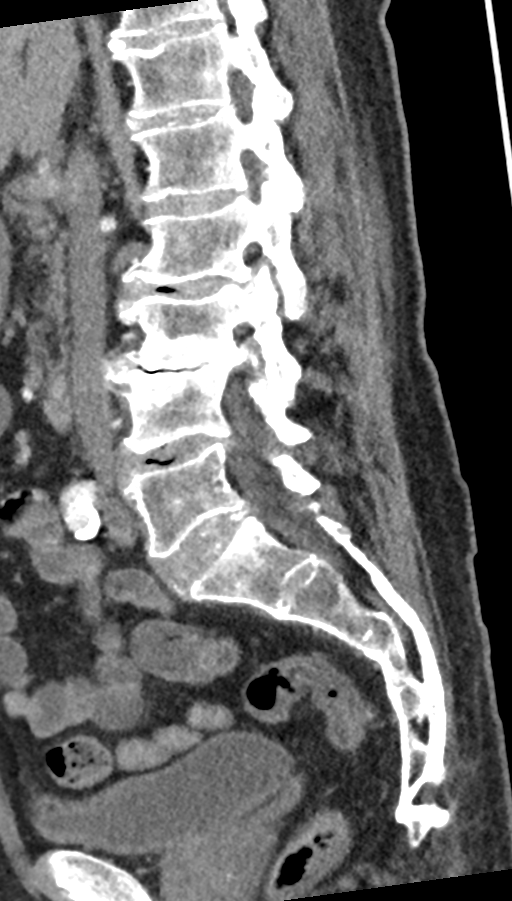
[im 46/92  bone]
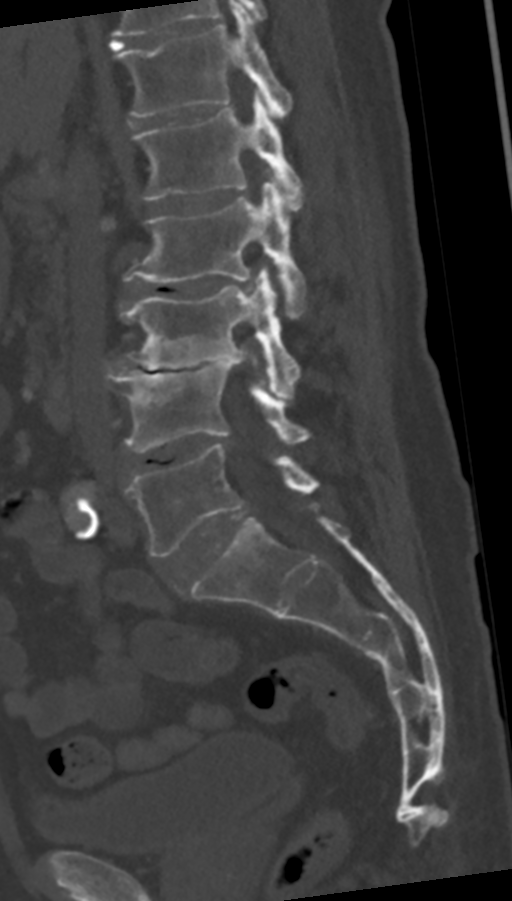
[im 54/92  bone]
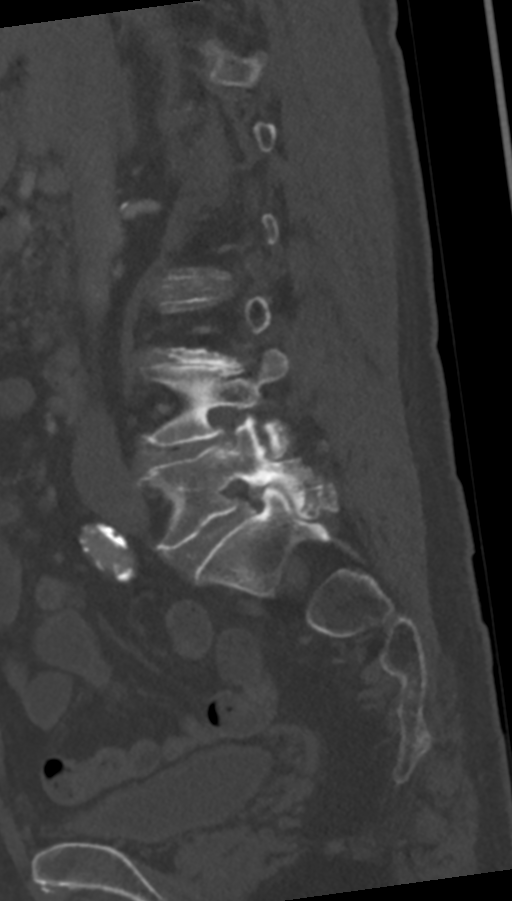
[im 61/92  bone]
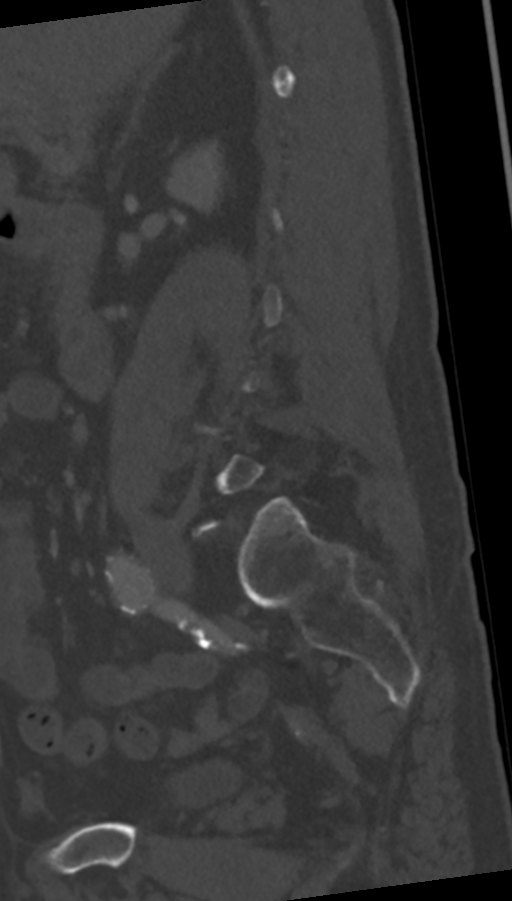

[10 of 33 positions shown; findings below may reference images not displayed]

FINDINGS: Segmentation: Standard.

Alignment: Reversal of the normal lumbar lordosis. 4 mm
retrolisthesis L3-4. 1 mm retrolisthesis L4-5. 1-2 mm retrolisthesis
L2-3.

Vertebrae: No worrisome osseous lesion. Sclerotic change due to
chronic disc space narrowing affects the endplates above and below
L3-L4.

Paraspinal and other soft tissues: Aortic atherosclerosis. 3.7 cm
infrarenal abdominal aortic aneurysm, with mural thrombus.

Disc levels:

L1-L2:  Calcified protrusion.  Facet arthropathy.  No impingement.

L2-L3: 1-2 mm retrolisthesis. Annular bulge. Facet arthropathy. No
definite impingement.

L3-L4: 4 mm retrolisthesis. Severe disc space narrowing. Facet
arthropathy. Mild stenosis. Subarticular zone and foraminal zone
narrowing could affect the L3 and L4 nerve roots.

L4-L5: 1 mm retrolisthesis. Annular bulge. Facet arthropathy. No
subarticular zone narrowing, but BILATERAL foraminal narrowing could
affect either L4 nerve root.

L5-S1: Calcified central protrusion extends into both foramina, LEFT
greater than RIGHT. Facet arthropathy. No definite subarticular zone
narrowing. Asymmetric foraminal narrowing on the LEFT. LEFT L5 nerve
root impingement is likely. See sagittal image 63 series 7.

Postcontrast imaging does not show definite abnormal enhancement.
IMPRESSION: Multilevel spondylosis as described. Potentially symptomatic neural
impingement at L3-4, L4-5, and/or L5-S1, see discussion above.

3.7 cm abdominal aortic aneurysm. Recommend followup by ultrasound
in 2 years. This recommendation follows ACR consensus guidelines:
White Paper of the ACR Incidental Findings Committee II on Vascular
Findings. [HOSPITAL] 9786; [DATE].

## 2019-08-27 ENCOUNTER — Other Ambulatory Visit: Payer: Self-pay

## 2019-08-27 ENCOUNTER — Ambulatory Visit (INDEPENDENT_AMBULATORY_CARE_PROVIDER_SITE_OTHER): Payer: Medicare Other | Admitting: *Deleted

## 2019-08-27 DIAGNOSIS — Z952 Presence of prosthetic heart valve: Secondary | ICD-10-CM

## 2019-08-27 DIAGNOSIS — Z5181 Encounter for therapeutic drug level monitoring: Secondary | ICD-10-CM

## 2019-08-27 DIAGNOSIS — I5022 Chronic systolic (congestive) heart failure: Secondary | ICD-10-CM | POA: Diagnosis not present

## 2019-08-27 LAB — POCT INR: INR: 1.8 — AB (ref 2.0–3.0)

## 2019-08-27 NOTE — Patient Instructions (Signed)
Amiodarone decreased to 100mg  daily  Increase warfarin to 1/2 tablet daily except 1 tablet on Wednesdays Continue greens and salads Recheck in 3 weeks  Coumadin Clinic [phone]

## 2019-08-29 LAB — CUP PACEART INCLINIC DEVICE CHECK
Brady Statistic RA Percent Paced: 7 %
Brady Statistic RV Percent Paced: 91 %
Date Time Interrogation Session: 20210309183153
HighPow Impedance: 70 Ohm
Implantable Lead Implant Date: 20180105
Implantable Lead Implant Date: 20180105
Implantable Lead Implant Date: 20180105
Implantable Lead Location: 753858
Implantable Lead Location: 753859
Implantable Lead Location: 753860
Implantable Lead Model: 292
Implantable Lead Model: 4672
Implantable Lead Model: 7741
Implantable Lead Serial Number: 428973
Implantable Lead Serial Number: 801088
Implantable Lead Serial Number: 849519
Implantable Pulse Generator Implant Date: 20180105
Lead Channel Impedance Value: 634 Ohm
Lead Channel Impedance Value: 772 Ohm
Lead Channel Impedance Value: 775 Ohm
Lead Channel Pacing Threshold Amplitude: 0.7 V
Lead Channel Pacing Threshold Amplitude: 0.8 V
Lead Channel Pacing Threshold Amplitude: 1 V
Lead Channel Pacing Threshold Pulse Width: 0.4 ms
Lead Channel Pacing Threshold Pulse Width: 0.4 ms
Lead Channel Pacing Threshold Pulse Width: 1 ms
Lead Channel Sensing Intrinsic Amplitude: 2.3 mV
Lead Channel Setting Pacing Amplitude: 2 V
Lead Channel Setting Pacing Amplitude: 2 V
Lead Channel Setting Pacing Amplitude: 2.2 V
Lead Channel Setting Pacing Pulse Width: 0.4 ms
Lead Channel Setting Pacing Pulse Width: 1 ms
Lead Channel Setting Sensing Sensitivity: 0.6 mV
Lead Channel Setting Sensing Sensitivity: 1 mV
Pulse Gen Serial Number: 180139

## 2019-09-18 ENCOUNTER — Ambulatory Visit (INDEPENDENT_AMBULATORY_CARE_PROVIDER_SITE_OTHER): Payer: Medicare Other | Admitting: *Deleted

## 2019-09-18 ENCOUNTER — Other Ambulatory Visit: Payer: Self-pay

## 2019-09-18 DIAGNOSIS — Z952 Presence of prosthetic heart valve: Secondary | ICD-10-CM

## 2019-09-18 DIAGNOSIS — Z5181 Encounter for therapeutic drug level monitoring: Secondary | ICD-10-CM | POA: Diagnosis not present

## 2019-09-18 DIAGNOSIS — I5022 Chronic systolic (congestive) heart failure: Secondary | ICD-10-CM | POA: Diagnosis not present

## 2019-09-18 LAB — POCT INR: INR: 2.9 (ref 2.0–3.0)

## 2019-09-18 NOTE — Patient Instructions (Signed)
Amiodarone decreased to 100mg  daily  Continue warfarin 1/2 tablet daily except 1 tablet on Wednesdays Continue greens and salads Recheck in 4 weeks  Coumadin Clinic [phone]

## 2019-10-03 ENCOUNTER — Other Ambulatory Visit: Payer: Self-pay | Admitting: *Deleted

## 2019-10-03 MED ORDER — WARFARIN SODIUM 5 MG PO TABS: ORAL_TABLET | ORAL | 2 refills | Status: DC

## 2019-10-06 ENCOUNTER — Ambulatory Visit (HOSPITAL_COMMUNITY)
Admission: RE | Admit: 2019-10-06 | Discharge: 2019-10-06 | Disposition: A | Discharge status: Home / Self Care | Payer: Medicare Other | Source: Ambulatory Visit | Attending: Internal Medicine | Admitting: Internal Medicine

## 2019-10-06 ENCOUNTER — Other Ambulatory Visit: Payer: Self-pay

## 2019-10-06 VITALS — BP 116/68 | HR 58 | Wt 171.2 lb

## 2019-10-06 DIAGNOSIS — I252 Old myocardial infarction: Secondary | ICD-10-CM | POA: Diagnosis not present

## 2019-10-06 DIAGNOSIS — I110 Hypertensive heart disease with heart failure: Secondary | ICD-10-CM | POA: Diagnosis not present

## 2019-10-06 DIAGNOSIS — I255 Ischemic cardiomyopathy: Secondary | ICD-10-CM | POA: Diagnosis not present

## 2019-10-06 DIAGNOSIS — Z79890 Hormone replacement therapy (postmenopausal): Secondary | ICD-10-CM | POA: Diagnosis not present

## 2019-10-06 DIAGNOSIS — I714 Abdominal aortic aneurysm, without rupture, unspecified: Secondary | ICD-10-CM

## 2019-10-06 DIAGNOSIS — Z952 Presence of prosthetic heart valve: Secondary | ICD-10-CM | POA: Insufficient documentation

## 2019-10-06 DIAGNOSIS — Z7982 Long term (current) use of aspirin: Secondary | ICD-10-CM | POA: Diagnosis not present

## 2019-10-06 DIAGNOSIS — Z888 Allergy status to other drugs, medicaments and biological substances status: Secondary | ICD-10-CM | POA: Diagnosis not present

## 2019-10-06 DIAGNOSIS — Z951 Presence of aortocoronary bypass graft: Secondary | ICD-10-CM | POA: Diagnosis not present

## 2019-10-06 DIAGNOSIS — I5022 Chronic systolic (congestive) heart failure: Secondary | ICD-10-CM | POA: Diagnosis not present

## 2019-10-06 DIAGNOSIS — Z881 Allergy status to other antibiotic agents status: Secondary | ICD-10-CM | POA: Insufficient documentation

## 2019-10-06 DIAGNOSIS — Z79899 Other long term (current) drug therapy: Secondary | ICD-10-CM | POA: Diagnosis not present

## 2019-10-06 DIAGNOSIS — Z95810 Presence of automatic (implantable) cardiac defibrillator: Secondary | ICD-10-CM | POA: Insufficient documentation

## 2019-10-06 DIAGNOSIS — I2510 Atherosclerotic heart disease of native coronary artery without angina pectoris: Secondary | ICD-10-CM | POA: Insufficient documentation

## 2019-10-06 DIAGNOSIS — Z87891 Personal history of nicotine dependence: Secondary | ICD-10-CM | POA: Insufficient documentation

## 2019-10-06 DIAGNOSIS — I442 Atrioventricular block, complete: Secondary | ICD-10-CM | POA: Insufficient documentation

## 2019-10-06 DIAGNOSIS — E785 Hyperlipidemia, unspecified: Secondary | ICD-10-CM | POA: Diagnosis not present

## 2019-10-06 DIAGNOSIS — Z8249 Family history of ischemic heart disease and other diseases of the circulatory system: Secondary | ICD-10-CM | POA: Insufficient documentation

## 2019-10-06 DIAGNOSIS — Z7984 Long term (current) use of oral hypoglycemic drugs: Secondary | ICD-10-CM | POA: Diagnosis not present

## 2019-10-06 DIAGNOSIS — Z7901 Long term (current) use of anticoagulants: Secondary | ICD-10-CM | POA: Diagnosis not present

## 2019-10-06 DIAGNOSIS — Z8674 Personal history of sudden cardiac arrest: Secondary | ICD-10-CM | POA: Insufficient documentation

## 2019-10-06 LAB — COMPREHENSIVE METABOLIC PANEL
ALT: 34 U/L (ref 0–44)
AST: 41 U/L (ref 15–41)
Albumin: 3.8 g/dL (ref 3.5–5.0)
Alkaline Phosphatase: 68 U/L (ref 38–126)
Anion gap: 9 (ref 5–15)
BUN: 17 mg/dL (ref 8–23)
CO2: 22 mmol/L (ref 22–32)
Calcium: 9 mg/dL (ref 8.9–10.3)
Chloride: 107 mmol/L (ref 98–111)
Creatinine, Ser: 1.13 mg/dL (ref 0.61–1.24)
GFR calc Af Amer: >60 mL/min (ref >60)
GFR calc non Af Amer: >60 mL/min (ref >60)
Glucose, Bld: 127 mg/dL — ABNORMAL HIGH (ref 70–99)
Potassium: 4.2 mmol/L (ref 3.5–5.1)
Sodium: 138 mmol/L (ref 135–145)
Total Bilirubin: 0.9 mg/dL (ref 0.3–1.2)
Total Protein: 7.2 g/dL (ref 6.5–8.1)

## 2019-10-06 LAB — CBC
HCT: 44.7 % (ref 39.0–52.0)
Hemoglobin: 15.1 g/dL (ref 13.0–17.0)
MCH: 39.2 pg — ABNORMAL HIGH (ref 26.0–34.0)
MCHC: 33.8 g/dL (ref 30.0–36.0)
MCV: 116.1 fL — ABNORMAL HIGH (ref 80.0–100.0)
Platelets: 400 10*3/uL (ref 150–400)
RBC: 3.85 MIL/uL — ABNORMAL LOW (ref 4.22–5.81)
RDW: 13.4 % (ref 11.5–15.5)
WBC: 4.1 10*3/uL (ref 4.0–10.5)
nRBC: 0 % (ref 0.0–0.2)

## 2019-10-06 LAB — TSH: TSH: 1.575 u[IU]/mL (ref 0.350–4.500)

## 2019-10-06 LAB — T4, FREE: Free T4: 1.4 ng/dL — ABNORMAL HIGH (ref 0.61–1.12)

## 2019-10-06 NOTE — Patient Instructions (Signed)
Labs done today. We will contact you only if your labs are abnormal.  No medication changes were made. Please continue all current medications as prescribed.   Your physician recommends that you schedule a follow-up appointment in: 6 months. Please contact our office in October to schedule a November appointment.   You have been referred to [facility]. They will contact you to schedule an appointment with Dr. Oneida Alar.   At the [facility], you and your health needs are our priority. As part of our continuing mission to provide you with exceptional heart care, we have created designated Provider Care Teams. These Care Teams include your primary Cardiologist (physician) and Advanced Practice Providers (APPs- Physician Assistants and Nurse Practitioners) who all work together to provide you with the care you need, when you need it.   You may see any of the following providers on your designated Care Team at your next follow up: Marland Kitchen Dr Glori Bickers . Dr Loralie Champagne . Darrick Grinder, NP . Lyda Jester, PA . Audry Riles, PharmD   Please be sure to bring in all your medications bottles to every appointment.

## 2019-10-06 NOTE — Addendum Note (Signed)
Encounter addended by: Scarlette Calico, RN on: 10/06/2019 10:56 AM  Actions taken: Order list changed, Diagnosis association updated

## 2019-10-06 NOTE — Progress Notes (Addendum)
Advanced Heart Failure Clinic Note   Referring Physician: PCP: Michell Heinrich, DO PCP-Cardiologist: Dr. Haroldine Laws   HPI:  Taylor [name] a 69 y.o.malewith mechnical AVR on  chronic coumadin, h/o VF arrest s/p ICD , CAD s/p CABG, chronic systolic HF (EF 0000000), AAA and h/o essential thrombosis.   Has h/o congenital AoV abnormality with AI. In 1994 had MI and underwent PCI (unclear artery) and mechanical AVR at [facility].   Has h/o polysubstance abuse with cocaine, ETOH and tobacco abuse. No longer doing cocaine or smoking. Now drinks about 2 beers per day.  Did well from heart perspective until 2017, when he had VF arrest on 05/18/16. Found to have 3v CAD and MV disease and underwent CABG x 3 with mechanical AVR. Post-operatively had AF (treated with amio) and CHB requiring implantation of a BosSci CRT-D device.This is followed by Dr. Caryl Comes. Coumadin Clinic at [facility] street follows INR. INR goal 2.0-3.0. PCP in [place] follows lipids.  Echo on 12/18 showed LVEF of 25-30% stable AVR  Had CT on 12/18 for his back. Showed 3.7cm AAA.Had f/u US 05/2018 that showedmildincrease in size of the known distal fusiform abdominal aortic aneurysm, measuring 4.2 cm in maximum diameter. He was referred to [facility] and he is now followed by Dr. Oneida Alar.   Most recent echo 02/2019 showed EF 20-25%. RV normal. At last [facility] visit in 02/2019, he was stable from CHF symptom standpoint, but has been intolerant to bid Entresto dosing due to dizziness. Only takes once daily. Wilder Glade was added to his HF regimen last visit.   He was admitted to [facility] on 06/19/19 w/ acute cholecystitis and sepsis. Hypotensive on admit w/ AKI and HF meds held. SCr bumped to 1.7 (baseline 1.1). Treated w/ IVFs and abx. Blood cultures negative. General surgery consulted and plans were for percutaneous cholecystostomy w/ drain, however HIDA scan was negative and he improved symptomatically w/ conservative therapy. His BP and renal  function also improved and HF meds restarted Delene Loll, Farxiga and Coreg; Spironolactone held). He remained stable from a cardiac/ HF standpoint. General surgery recommended continuation of abx (Cipro + Flagyl x 10 days) and outpatient f/u.    He presents to clinic today for f/u. Says he saw Dr. Grandville Silos in [place] recently and decided to continue medical therapy. Remains active. Able to cut grass. Goes to [facility] and walks a few laps every day. Only taking Entresto once a day because if he takes it bid gets dizzy.   Past Medical History:  Diagnosis Date  . Atherosclerotic heart disease of native coronary artery without angina pectoris   . Chronic anticoagulation 02/18/2018  . Chronic systolic heart failure ([place]) 02/02/2017  . Essential thrombocythemia ([place]) 02/15/2017  . Hyperlipidemia   . Hypertension   . Ischemic cardiomyopathy   . Leg pain, central, left 04/17/2017   Hx sciatica - pain similar; no red flag signs on hx or exam   . Presence of other heart-valve replacement   . Ventricular tachycardia ([place])     Current Outpatient Medications  Medication Sig Dispense Refill  . amiodarone (PACERONE) 200 MG tablet Take 0.5 tablets (100 mg total) by mouth daily. 15 tablet 11  . aspirin 81 MG tablet Take 1 tablet by mouth.    Marland Kitchen atorvastatin (LIPITOR) 40 MG tablet Take 1 tablet (40 mg total) by mouth at bedtime. 90 tablet 1  . carvedilol (COREG) 3.125 MG tablet Take 1 tablet (3.125 mg total) by mouth 2 (two) times daily with a meal. 60  tablet 3  . dapagliflozin propanediol (FARXIGA) 10 MG TABS tablet Take 10 mg by mouth daily.    . hydroxyurea (HYDREA) 500 MG capsule TAKE 1 CAPSULE BY MOUTH ALTERNATING WITH 2 CAPSULES DAILY(500 MG ONE DAY AND 1000 MG NEXT DAY). 135 capsule 3  . levothyroxine (SYNTHROID) 175 MCG tablet Take 175 mcg by mouth daily before breakfast.    . sacubitril-valsartan (ENTRESTO) 24-26 MG Take 1 tablet by mouth daily.    Marland Kitchen spironolactone (ALDACTONE) 25 MG tablet Take 0.5  tablets (12.5 mg total) by mouth daily. 45 tablet 3  . warfarin (COUMADIN) 5 MG tablet Take as directed by coumadin clinic 30 tablet 2   No current facility-administered medications for this encounter.    Allergies  Allergen Reactions  . Erythromycin Rash  . Atorvastatin Calcium Other (See Comments)  . Rosuvastatin Calcium     Unknown  . Simvastatin     Unknown  . Zithromax [Azithromycin] Rash      Social History   Socioeconomic History  . Marital status: Married    Spouse name: Not on file  . Number of children: Not on file  . Years of education: Not on file  . Highest education level: Not on file  Occupational History  . Not on file  Tobacco Use  . Smoking status: Former Smoker    Packs/day: 1.00    Years: 30.00    Pack years: 30.00    Types: Cigarettes    Start date: 05/26/1979    Quit date: 05/18/2016    Years since quitting: 3.3  . Smokeless tobacco: Never Used  Substance and Sexual Activity  . Alcohol use: Yes    Alcohol/week: 14.0 standard drinks    Types: 14 Cans of beer per week    Comment: 2 cans a day..  . Drug use: No  . Sexual activity: Not on file  Other Topics Concern  . Not on file  Social History Narrative  . Not on file   Social Determinants of Health   Financial Resource Strain:   . Difficulty of Paying Living Expenses:   Food Insecurity:   . Worried About Charity fundraiser in the Last Year:   . Arboriculturist in the Last Year:   Transportation Needs:   . Film/video editor (Medical):   Marland Kitchen Lack of Transportation (Non-Medical):   Physical Activity:   . Days of Exercise per Week:   . Minutes of Exercise per Session:   Stress:   . Feeling of Stress :   Social Connections:   . Frequency of Communication with Friends and Family:   . Frequency of Social Gatherings with Friends and Family:   . Attends Religious Services:   . Active Member of Clubs or Organizations:   . Attends Archivist Meetings:   Marland Kitchen Marital Status:    Intimate Partner Violence:   . Fear of Current or Ex-Partner:   . Emotionally Abused:   Marland Kitchen Physically Abused:   . Sexually Abused:       Family History  Problem Relation Age of Onset  . Hypertension Father     Vitals:   10/06/19 1020  BP: 116/68  Pulse: (!) 58  SpO2: 98%  Weight: 77.7 kg (171 lb 4 oz)     PHYSICAL EXAM: General:  Well appearing. No resp difficulty HEENT: normal Neck: supple. no JVD. Carotids 2+ bilat; no bruits. No lymphadenopathy or thryomegaly appreciated. Cor: PMI nondisplaced. Regular rate & rhythm. Mechanical s2No rubs,  gallops or murmurs. Lungs: clear Abdomen: soft, nontender, nondistended. No hepatosplenomegaly. No bruits or masses. Good bowel sounds. Extremities: no cyanosis, clubbing, rash, edema Neuro: alert & orientedx3, cranial nerves grossly intact. moves all 4 extremities w/o difficulty. Affect pleasant    ASSESSMENT & PLAN:  1. Chronic Systolic Heart Failure: -Echo 05/2016 @ [facility], EF 40% -Echo 04/2017 EF 25-30% -Echo 04/2019 EF 20-25%. Diffuse hypokinesis, No LVH. RV normal. Mild AI, trace MR - s/p CRT-D.  - NYHA Class II. Volume status ok  - Continue Entresto 24-26 (takes once daily, unable to tolerate BID dosing due to dizziness - has tried multiple times) - Continue Farxiga 10 mg daily - Continue Coreg 3.125 mg bid.  - Continue spiro 12.5 daily   2. Cholelithiasis: recent admission for acute cholecystitis/ sepsis. HIDA scan negative. Improved w/ conservative therapy w/ abx and bowel rest.  - has seen [facility]. Conservative therapy for now  3. CAD: - h/o remote PCI in 1994 at [facility] - s/p LHC in 2017 in the setting of Vfib Arrest w/ 3V CAD treated w/ CABG x 3 - no s/s angina - continue ASA, statin and  blocker.  4. Mechanical Aortic Valve: - crips mechanical valve sounds on exam. No chest pain or dyspnea - on coumadin. INRs followed by [facility] - no bleeding  5. H/o VF Arrest and H/o CHB: - s/p BosSci CRT-D  device, followed by EP (Dr. Caryl Comes) - on PO amiodarone. Decreased to 100 mg daily   6. AAA:  - slight size progression on CT scan 1/21, now measuring at 4.6 x 4.5 cm (nonruptured) - followed by [facility], Dr. Oneida Alar has not heard back. Will arrange f/u - BP and HR controlled w/  blocker - on statin therapy     Glori Bickers, MD 10/06/19

## 2019-10-06 NOTE — Addendum Note (Signed)
Encounter addended by: Shonna Chock, CMA on: [id number] 11:02 AM  Actions taken: Order list changed, Diagnosis association updated, Charge Capture section accepted, Clinical Note Signed

## 2019-10-07 LAB — T3, FREE: T3, Free: 2 pg/mL (ref 2.0–4.4)

## 2019-10-23 ENCOUNTER — Other Ambulatory Visit: Payer: Self-pay

## 2019-10-23 ENCOUNTER — Ambulatory Visit (INDEPENDENT_AMBULATORY_CARE_PROVIDER_SITE_OTHER): Payer: Medicare Other | Admitting: *Deleted

## 2019-10-23 DIAGNOSIS — Z952 Presence of prosthetic heart valve: Secondary | ICD-10-CM

## 2019-10-23 DIAGNOSIS — I5022 Chronic systolic (congestive) heart failure: Secondary | ICD-10-CM

## 2019-10-23 DIAGNOSIS — Z5181 Encounter for therapeutic drug level monitoring: Secondary | ICD-10-CM

## 2019-10-23 LAB — POCT INR: INR: 4.6 — AB (ref 2.0–3.0)

## 2019-10-23 NOTE — Patient Instructions (Signed)
Amiodarone 100mg  daily  Hold warfarin tonight and tomorrow night then resume 1/2 tablet daily except 1 tablet on Wednesdays Continue greens and salads Recheck in 3 weeks  Coumadin Clinic [phone]

## 2019-11-06 ENCOUNTER — Ambulatory Visit (INDEPENDENT_AMBULATORY_CARE_PROVIDER_SITE_OTHER): Payer: Medicare Other | Admitting: *Deleted

## 2019-11-06 DIAGNOSIS — I5022 Chronic systolic (congestive) heart failure: Secondary | ICD-10-CM

## 2019-11-06 LAB — CUP PACEART REMOTE DEVICE CHECK
Battery Remaining Longevity: 96 mo
Battery Remaining Percentage: 100 %
Brady Statistic RA Percent Paced: 1 %
Brady Statistic RV Percent Paced: 90 %
Date Time Interrogation Session: 20210610022600
HighPow Impedance: 77 Ohm
Implantable Lead Implant Date: 20180105
Implantable Lead Implant Date: 20180105
Implantable Lead Implant Date: 20180105
Implantable Lead Location: 753858
Implantable Lead Location: 753859
Implantable Lead Location: 753860
Implantable Lead Model: 292
Implantable Lead Model: 4672
Implantable Lead Model: 7741
Implantable Lead Serial Number: 428973
Implantable Lead Serial Number: 801088
Implantable Lead Serial Number: 849519
Implantable Pulse Generator Implant Date: 20180105
Lead Channel Impedance Value: 600 Ohm
Lead Channel Impedance Value: 717 Ohm
Lead Channel Impedance Value: 725 Ohm
Lead Channel Pacing Threshold Amplitude: 0.7 V
Lead Channel Pacing Threshold Amplitude: 0.8 V
Lead Channel Pacing Threshold Amplitude: 1 V
Lead Channel Pacing Threshold Pulse Width: 0.4 ms
Lead Channel Pacing Threshold Pulse Width: 0.4 ms
Lead Channel Pacing Threshold Pulse Width: 1 ms
Lead Channel Setting Pacing Amplitude: 2 V
Lead Channel Setting Pacing Amplitude: 2 V
Lead Channel Setting Pacing Amplitude: 2.2 V
Lead Channel Setting Pacing Pulse Width: 0.4 ms
Lead Channel Setting Pacing Pulse Width: 1 ms
Lead Channel Setting Sensing Sensitivity: 0.6 mV
Lead Channel Setting Sensing Sensitivity: 1 mV
Pulse Gen Serial Number: 180139

## 2019-11-07 NOTE — Progress Notes (Signed)
Remote ICD transmission.   

## 2019-11-13 ENCOUNTER — Ambulatory Visit (INDEPENDENT_AMBULATORY_CARE_PROVIDER_SITE_OTHER): Payer: Medicare Other | Admitting: *Deleted

## 2019-11-13 DIAGNOSIS — Z952 Presence of prosthetic heart valve: Secondary | ICD-10-CM | POA: Diagnosis not present

## 2019-11-13 DIAGNOSIS — I5022 Chronic systolic (congestive) heart failure: Secondary | ICD-10-CM | POA: Diagnosis not present

## 2019-11-13 DIAGNOSIS — Z5181 Encounter for therapeutic drug level monitoring: Secondary | ICD-10-CM | POA: Diagnosis not present

## 2019-11-13 LAB — POCT INR: INR: 3.2 — AB (ref 2.0–3.0)

## 2019-11-13 NOTE — Patient Instructions (Signed)
Amiodarone 100mg  daily  Decrease warfarin to 1/2 tablet daily Continue greens and salads Recheck in 4 weeks  Coumadin Clinic [phone]

## 2019-11-17 ENCOUNTER — Other Ambulatory Visit: Payer: Self-pay | Admitting: *Deleted

## 2019-11-17 DIAGNOSIS — I714 Abdominal aortic aneurysm, without rupture, unspecified: Secondary | ICD-10-CM

## 2019-11-25 ENCOUNTER — Telehealth: Payer: Self-pay

## 2019-11-25 NOTE — Telephone Encounter (Signed)
Pt called to find out instructions for AAA duplex scheduled on 11/27/19. Pt advised,   No food after 11PM the night before.  Water is OK. (Don't drink liquids if you have been instructed not to for ANOTHER test). Take two Extra-Strength Gas-X capsules at bedtime the night before test.   Take an additional two Extra-Strength Gas-X capsules three (3) hours before the test or first thing in the morning. Avoid foods that produce bowel gas, for 24 hours prior to exam (see below). No breakfast, no chewing gum, no smoking or carbonated beverages. Patient may take morning medications with water. Pt voiced understanding.

## 2019-11-27 ENCOUNTER — Ambulatory Visit (HOSPITAL_COMMUNITY)
Admission: RE | Admit: 2019-11-27 | Discharge: 2019-11-27 | Disposition: A | Discharge status: Home / Self Care | Payer: Medicare Other | Source: Ambulatory Visit | Attending: Vascular Surgery | Admitting: Vascular Surgery

## 2019-11-27 ENCOUNTER — Encounter: Payer: Self-pay | Admitting: Vascular Surgery

## 2019-11-27 ENCOUNTER — Other Ambulatory Visit: Payer: Self-pay

## 2019-11-27 ENCOUNTER — Ambulatory Visit (INDEPENDENT_AMBULATORY_CARE_PROVIDER_SITE_OTHER): Payer: Medicare Other | Admitting: Vascular Surgery

## 2019-11-27 VITALS — BP 109/70 | HR 72 | Temp 98.0°F | Resp 20 | Ht 69.0 in | Wt 165.0 lb

## 2019-11-27 DIAGNOSIS — I714 Abdominal aortic aneurysm, without rupture, unspecified: Secondary | ICD-10-CM

## 2019-11-27 NOTE — Progress Notes (Signed)
Patient is a 69 year old male with known abdominal aortic aneurysms discovered on lumbar spine CT in 2019.  At that time aneurysm diameter was 3.7 cm.  Ultrasound of the abdominal aorta in February 2020 showed 4.2 cm diameter.  He had a CT scan of the chest abdomen and pelvis January 2021 this was done for abdominal pain.  Aneurysm diameter at that point was 4.6 cm.  Other chronic medical problems include significant cardiac dysfunction with ejection fraction of 25 %.  This was in October 2020.  He has a left-sided AICD.  Other chronic medical problems also include hyperlipidemia hypertension which have been stable.  He has had a prior aortic valve replacement and is on warfarin.  Current Outpatient Medications on File Prior to Visit  Medication Sig Dispense Refill  . amiodarone (PACERONE) 200 MG tablet Take 0.5 tablets (100 mg total) by mouth daily. 15 tablet 11  . aspirin 81 MG tablet Take 1 tablet by mouth.    Marland Kitchen atorvastatin (LIPITOR) 40 MG tablet Take 1 tablet (40 mg total) by mouth at bedtime. 90 tablet 1  . carvedilol (COREG) 3.125 MG tablet Take 1 tablet (3.125 mg total) by mouth 2 (two) times daily with a meal. 60 tablet 3  . dapagliflozin propanediol (FARXIGA) 10 MG TABS tablet Take 10 mg by mouth daily.    . hydroxyurea (HYDREA) 500 MG capsule TAKE 1 CAPSULE BY MOUTH ALTERNATING WITH 2 CAPSULES DAILY(500 MG ONE DAY AND 1000 MG NEXT DAY). 135 capsule 3  . levothyroxine (SYNTHROID) 175 MCG tablet Take 175 mcg by mouth daily before breakfast.    . sacubitril-valsartan (ENTRESTO) 24-26 MG Take 1 tablet by mouth daily.    Marland Kitchen spironolactone (ALDACTONE) 25 MG tablet Take 0.5 tablets (12.5 mg total) by mouth daily. 45 tablet 3  . warfarin (COUMADIN) 5 MG tablet Take as directed by coumadin clinic 30 tablet 2   No current facility-administered medications on file prior to visit.   Review of systems: He can climb a flight of stairs without getting short of breath.  He has no chest pain.  Physical  exam:  Vitals:   11/27/19 0855  BP: 109/70  Pulse: 72  Resp: 20  Temp: 98 F (36.7 C)  SpO2: 97%  Weight: 165 lb (74.8 kg)  Height: 5\' 9"  (1.753 m)   Abdomen: Soft nontender nondistended  Extremities no palpable popliteal or pedal pulses 2+ femoral pulses  Data:  I reviewed the images of the CT scan from January of this year which does show a 4.6 cm infrarenal abdominal aortic aneurysm.  There was no evidence of thoracic aneurysm.  Patient had an aortic ultrasound today aortic diameter was 4.6 cm.  This is unchanged from his CT scan over the last 6 months.  I reviewed and interpreted the study.  Assessment: Patient with 4.6 cm infrarenal abdominal aortic aneurysm.  The aneurysm has had 4 mm of growth in the last 14 months.  Plan: Patient will need follow-up ultrasound of the abdominal aorta in 6 months time.  If the aneurysm growth continues at this current case we would need to consider repair.  Otherwise repair would be considered at greater than 5-1/2 cm diameter although we would have to take into account his overall cardiac risk and morbidity.  Ruta Hinds, MD [facility] Office: [phone]

## 2019-12-02 ENCOUNTER — Other Ambulatory Visit: Payer: Self-pay | Admitting: *Deleted

## 2019-12-02 DIAGNOSIS — I714 Abdominal aortic aneurysm, without rupture, unspecified: Secondary | ICD-10-CM

## 2019-12-11 ENCOUNTER — Ambulatory Visit (INDEPENDENT_AMBULATORY_CARE_PROVIDER_SITE_OTHER): Payer: Medicare Other | Admitting: *Deleted

## 2019-12-11 DIAGNOSIS — I5022 Chronic systolic (congestive) heart failure: Secondary | ICD-10-CM

## 2019-12-11 DIAGNOSIS — Z5181 Encounter for therapeutic drug level monitoring: Secondary | ICD-10-CM

## 2019-12-11 DIAGNOSIS — Z952 Presence of prosthetic heart valve: Secondary | ICD-10-CM

## 2019-12-11 LAB — POCT INR: INR: 1.9 — AB (ref 2.0–3.0)

## 2019-12-11 NOTE — Patient Instructions (Signed)
Amiodarone 100mg  daily  Take 1 tablet tonight then resume 1/2 tablet daily Continue greens and salads Recheck in 4 weeks  Coumadin Clinic [phone]

## 2019-12-17 IMAGING — DX DG CHEST 2V
2 series · 2 of 2 positions shown · non-contrast
Comparison: None

CLINICAL DATA: ICD placement

EXAM:
CHEST - 2 VIEW

[chest pa]
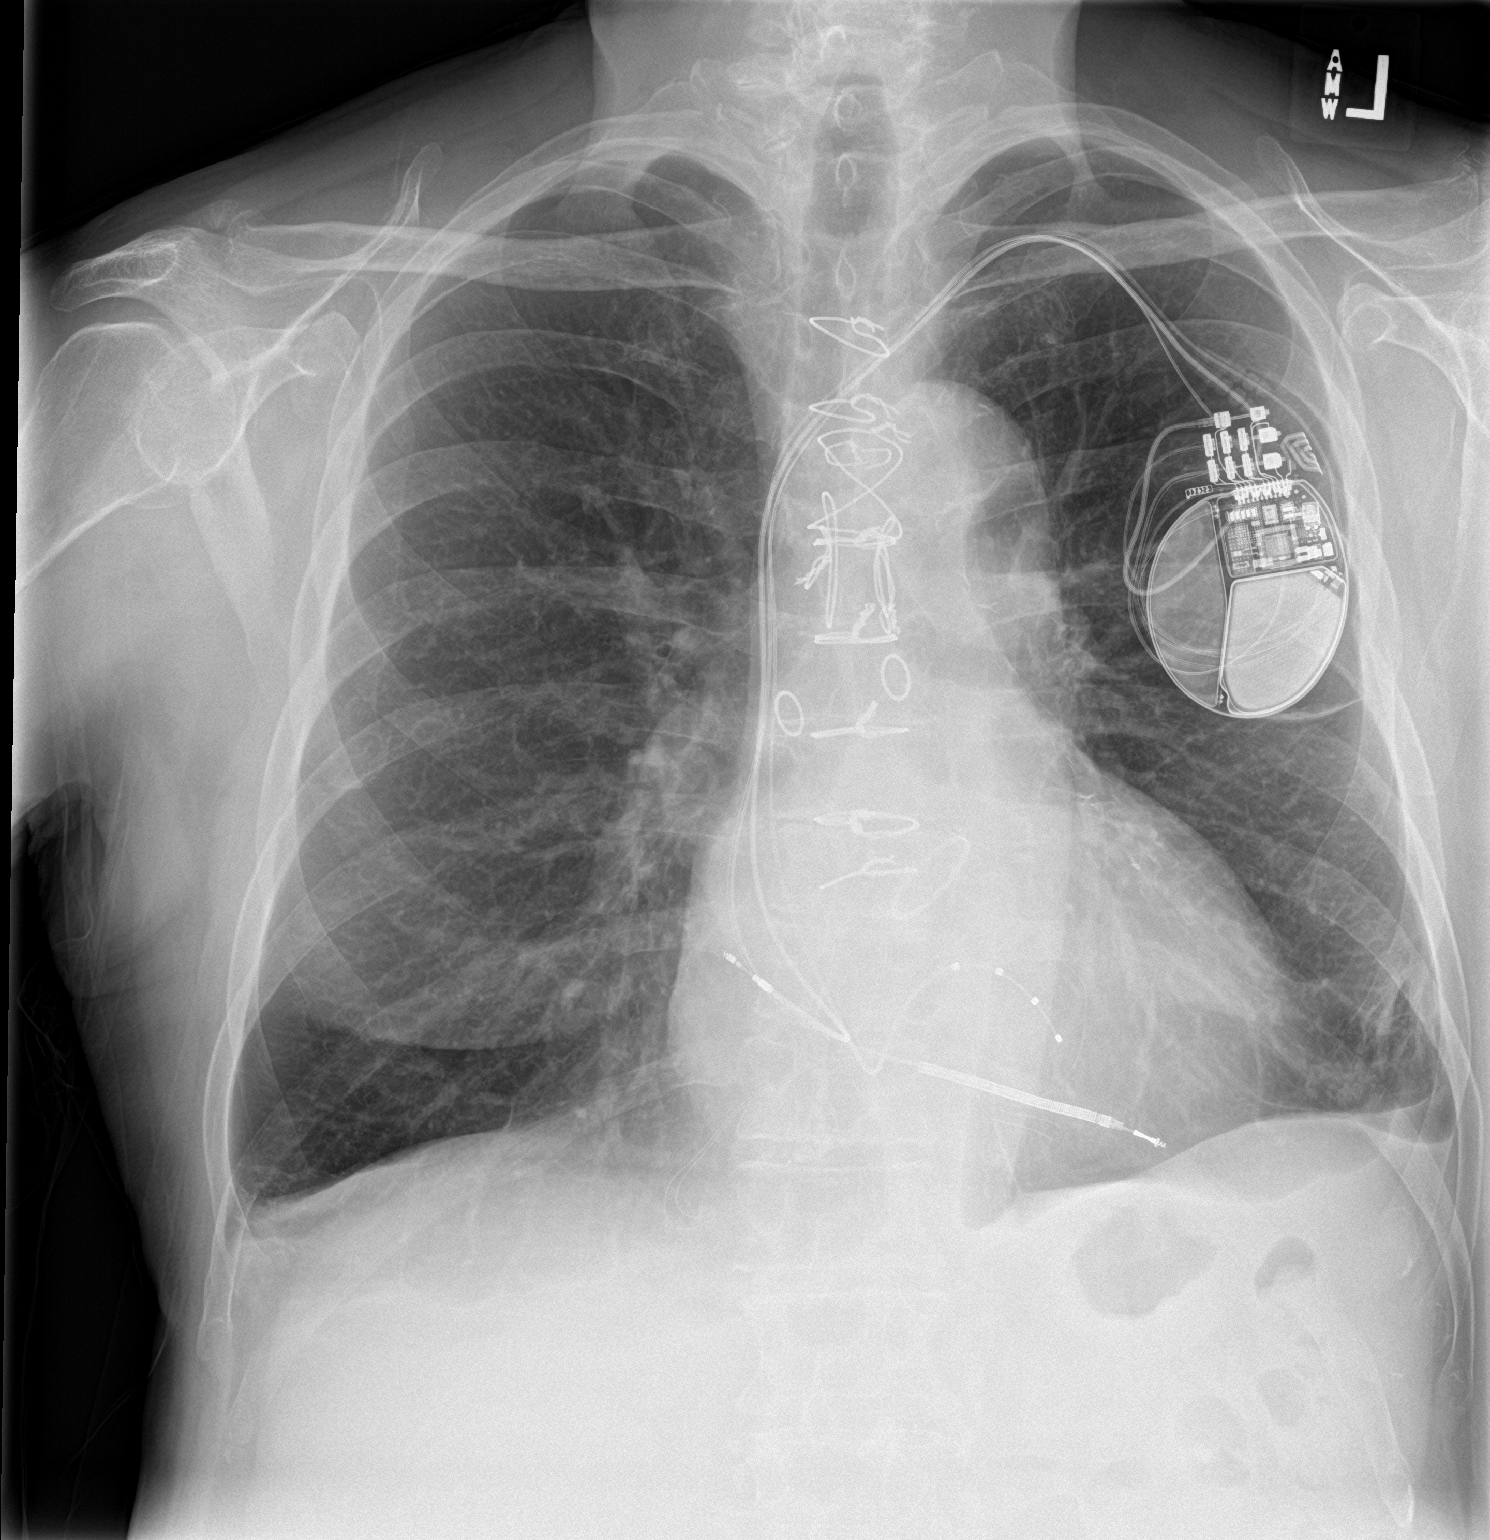

[chest lat]
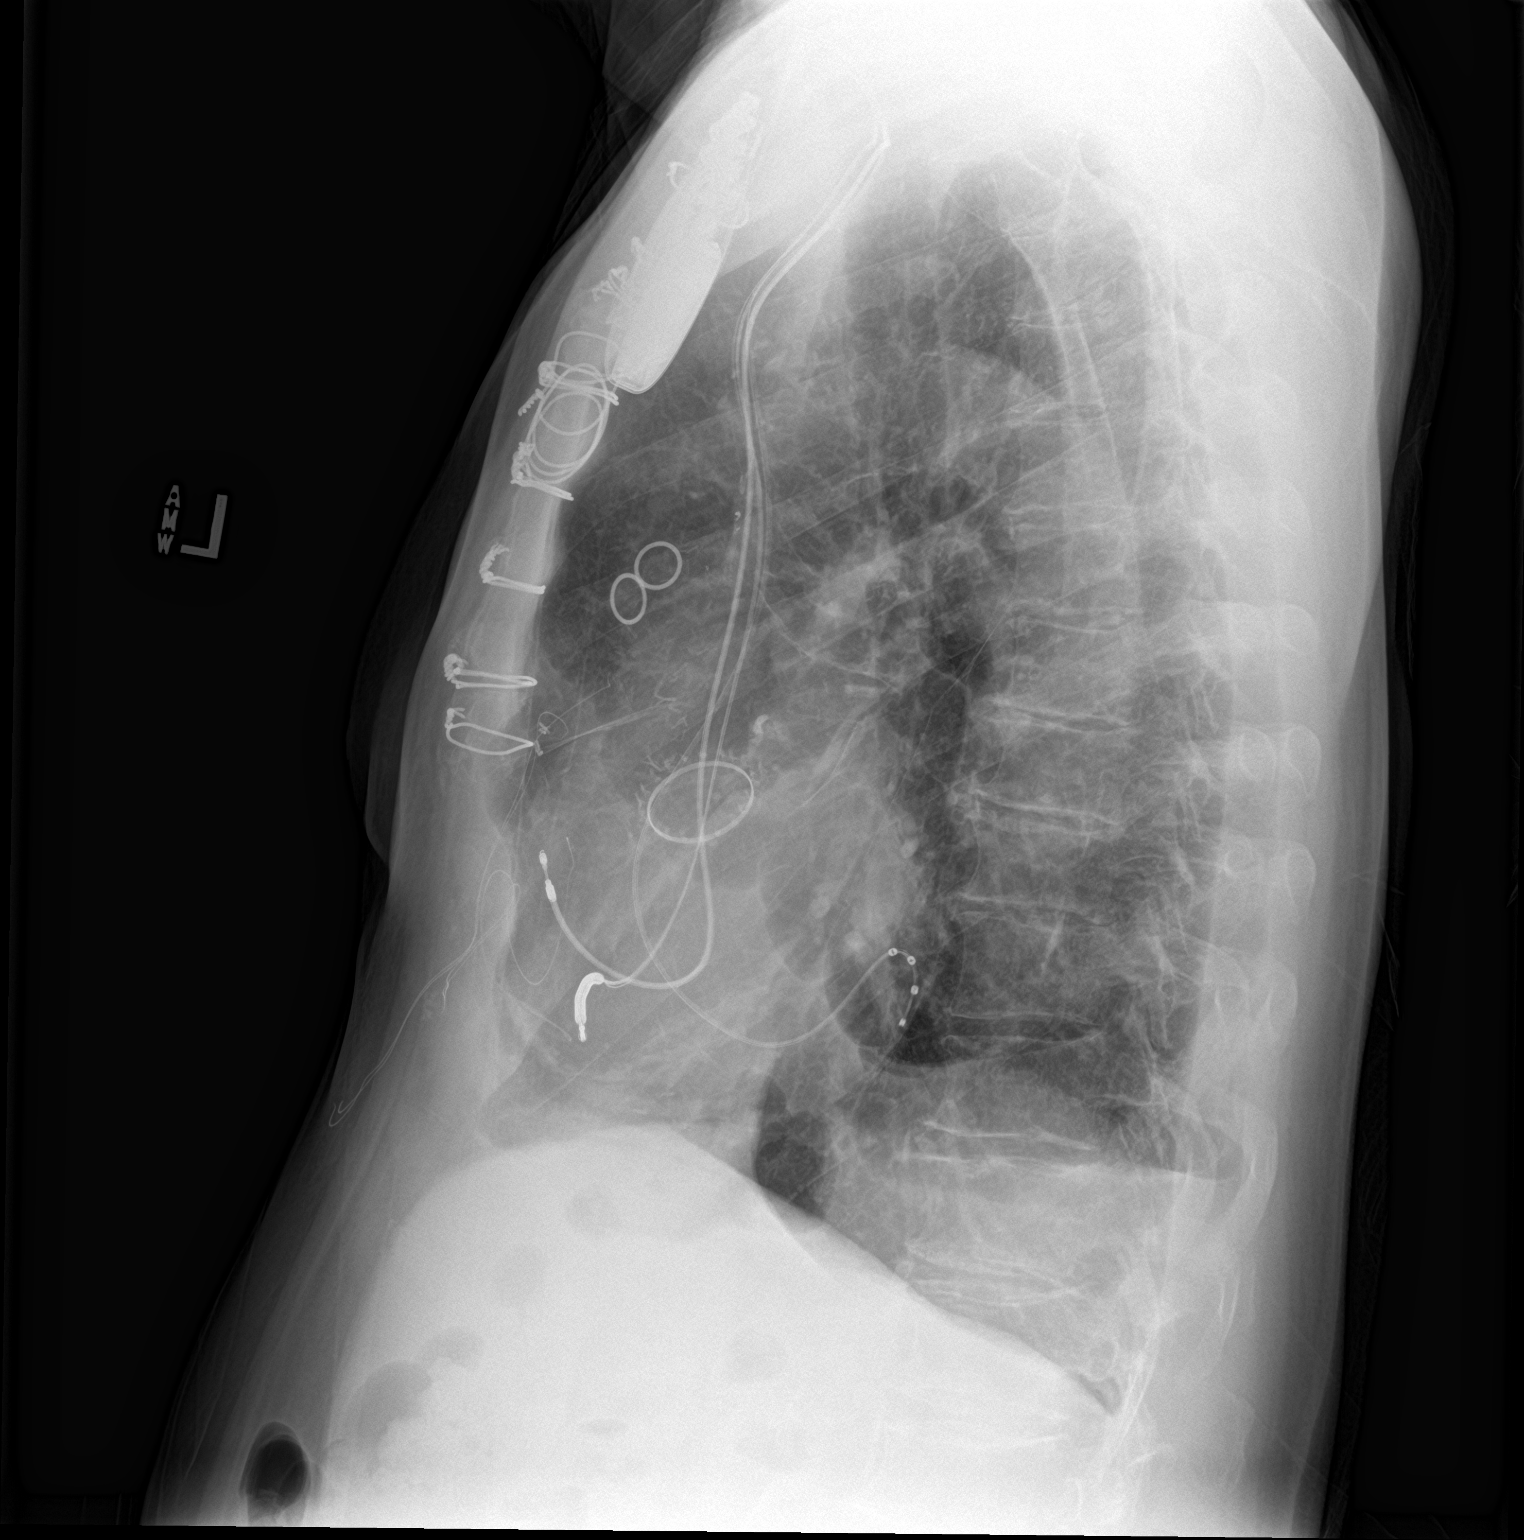

[2 of 2 positions shown; findings below may reference images not displayed]

FINDINGS: LEFT subclavian AICD with leads projecting at RIGHT atrium, RIGHT
ventricle, and coronary sinus.

Upper normal size of cardiac silhouette post CABG and AVR.

Epicardial pacing wires noted.

Atherosclerotic calcifications aorta.

Lungs emphysematous with scarring at LEFT base.

No acute infiltrate, pleural effusion or pneumothorax.

Bones demineralized.
IMPRESSION: Post CABG, AVR and AICD placement.

No pneumothorax.

COPD changes with LEFT basilar scarring.

## 2020-01-12 ENCOUNTER — Ambulatory Visit (INDEPENDENT_AMBULATORY_CARE_PROVIDER_SITE_OTHER): Payer: Medicare Other | Admitting: *Deleted

## 2020-01-12 DIAGNOSIS — Z952 Presence of prosthetic heart valve: Secondary | ICD-10-CM | POA: Diagnosis not present

## 2020-01-12 DIAGNOSIS — Z5181 Encounter for therapeutic drug level monitoring: Secondary | ICD-10-CM | POA: Diagnosis not present

## 2020-01-12 LAB — POCT INR: INR: 2.1 (ref 2.0–3.0)

## 2020-01-12 NOTE — Patient Instructions (Signed)
Amiodarone 100mg  daily  Continue warfarin 1/2 tablet daily Continue greens and salads Recheck in 4 weeks  Coumadin Clinic [phone]

## 2020-02-05 ENCOUNTER — Ambulatory Visit (INDEPENDENT_AMBULATORY_CARE_PROVIDER_SITE_OTHER): Payer: Medicare Other | Admitting: *Deleted

## 2020-02-05 ENCOUNTER — Telehealth (INDEPENDENT_AMBULATORY_CARE_PROVIDER_SITE_OTHER): Payer: Medicare Other | Admitting: Internal Medicine

## 2020-02-05 ENCOUNTER — Other Ambulatory Visit: Payer: Self-pay

## 2020-02-05 VITALS — Ht 69.0 in | Wt 160.0 lb

## 2020-02-05 DIAGNOSIS — Z95810 Presence of automatic (implantable) cardiac defibrillator: Secondary | ICD-10-CM

## 2020-02-05 DIAGNOSIS — I255 Ischemic cardiomyopathy: Secondary | ICD-10-CM | POA: Diagnosis not present

## 2020-02-05 DIAGNOSIS — I4901 Ventricular fibrillation: Secondary | ICD-10-CM | POA: Diagnosis not present

## 2020-02-05 LAB — CUP PACEART REMOTE DEVICE CHECK
Battery Remaining Longevity: 96 mo
Battery Remaining Percentage: 100 %
Brady Statistic RA Percent Paced: 1 %
Brady Statistic RV Percent Paced: 88 %
Date Time Interrogation Session: 20210909004100
HighPow Impedance: 77 Ohm
Implantable Lead Implant Date: 20180105
Implantable Lead Implant Date: 20180105
Implantable Lead Implant Date: 20180105
Implantable Lead Location: 753858
Implantable Lead Location: 753859
Implantable Lead Location: 753860
Implantable Lead Model: 292
Implantable Lead Model: 4672
Implantable Lead Model: 7741
Implantable Lead Serial Number: 428973
Implantable Lead Serial Number: 801088
Implantable Lead Serial Number: 849519
Implantable Pulse Generator Implant Date: 20180105
Lead Channel Impedance Value: 620 Ohm
Lead Channel Impedance Value: 685 Ohm
Lead Channel Impedance Value: 721 Ohm
Lead Channel Pacing Threshold Amplitude: 0.7 V
Lead Channel Pacing Threshold Amplitude: 0.8 V
Lead Channel Pacing Threshold Amplitude: 1 V
Lead Channel Pacing Threshold Pulse Width: 0.4 ms
Lead Channel Pacing Threshold Pulse Width: 0.4 ms
Lead Channel Pacing Threshold Pulse Width: 1 ms
Lead Channel Setting Pacing Amplitude: 2 V
Lead Channel Setting Pacing Amplitude: 2 V
Lead Channel Setting Pacing Amplitude: 2.2 V
Lead Channel Setting Pacing Pulse Width: 0.4 ms
Lead Channel Setting Pacing Pulse Width: 1 ms
Lead Channel Setting Sensing Sensitivity: 0.6 mV
Lead Channel Setting Sensing Sensitivity: 1 mV
Pulse Gen Serial Number: 180139

## 2020-02-05 NOTE — Patient Instructions (Addendum)
Medication Instructions:  Your physician has recommended you make the following change in your medication:   As discussed with Dr Caryl Comes re: Delene Loll  *If you need a refill on your cardiac medications before your next appointment, please call your pharmacy*   Lab Work Please have your PCP fax a copy of labs to [phone]  If you have labs (blood work) drawn today and your tests are completely normal, you will receive your results only by: Marland Kitchen MyChart Message (if you have MyChart) OR . A paper copy in the mail If you have any lab test that is abnormal or we need to change your treatment, we will call you to review the results.   Testing/Procedures: None ordered.   Follow-Up: At [facility], you and your health needs are our priority.  As part of our continuing mission to provide you with exceptional heart care, we have created designated Provider Care Teams.  These Care Teams include your primary Cardiologist (physician) and Advanced Practice Providers (APPs -  Physician Assistants and Nurse Practitioners) who all work together to provide you with the care you need, when you need it.  We recommend signing up for the patient portal called "MyChart".  Sign up information is provided on this After Visit Summary.  MyChart is used to connect with patients for Virtual Visits (Telemedicine).  Patients are able to view lab/test results, encounter notes, upcoming appointments, etc.  Non-urgent messages can be sent to your provider as well.   To learn more about what you can do with MyChart, go to [email or web address].    Your next appointment:   6 month(s)  The format for your next appointment:   In Person  Provider:   Virl Axe, MD   Patient instructions mailed to pt's address on file in [facility]

## 2020-02-05 NOTE — Progress Notes (Signed)
Electrophysiology TeleHealth Note   Due to national recommendations of social distancing due to COVID 19, an audio/video telehealth visit is felt to be most appropriate for this patient at this time.  See MyChart message from today for the patient's consent to telehealth for [facility].   Date:  02/05/2020   ID:  Taylor [name], DOB Jun 14, 1950, MRN [id number]  Location: patient's home  Provider location: [address], [place]  Evaluation Performed: Follow-up visit  PCP:  Michell Heinrich, DO  Cardiologist:   DB Electrophysiologist:  SK   Chief Complaint:  VF  History of Present Illness:    Taylor [name] is a 69 y.o. male who presents via audio/video conferencing for a telehealth visit today.  Since last being seen in our clinic for VF arrest 12/17 w hx of antecedent  mechanical aortic valve for congenital aortic malformation with aortic insufficiency.   Following his arrest he underwent CABG and mechanical mitral valve replacement. Ended up with postoperative CHB and underwent CRT-D implantation for ischemic cardiomyopathy with prior MI and prior PCI,  the patient reports having caught COVID-- mild case.  He was not vaccinated  Doing   On amiodarone for freq PVCs started at [facility]; surveillance labs notable for hypothyroidism and hypertransaminasemia   anticoagulation warfarin and ASA  The patient denies chest pain *, shortness of breath  nocturnal dyspnea  Orthopnea  or peripheral edema .  There have been no palpitations , lightheadedness  or syncope Fatigue  Stopped taking entresto 2/2 ED  .    DATE TEST EF   1/18 Echo  40%   12/18 Echo  25-30%   10/20 Echo  20-25% BAE    Date Cr K Hgb TSH LFTs  1/20 1.19 3.8 13.1 21.5 23  2/21  1.09 3.7   110(1/21)  5/21 1.13 4.2 15.1 1.57 34     The patient denies symptoms of fevers, chills, cough, or new SOB worrisome for COVID 19.    Past Medical History:  Diagnosis Date  . Atherosclerotic heart  disease of native coronary artery without angina pectoris   . Chronic anticoagulation 02/18/2018  . Chronic systolic heart failure ([place]) 02/02/2017  . Essential thrombocythemia ([place]) 02/15/2017  . Hyperlipidemia   . Hypertension   . Ischemic cardiomyopathy   . Leg pain, central, left 04/17/2017   Hx sciatica - pain similar; no red flag signs on hx or exam   . Presence of other heart-valve replacement   . Ventricular tachycardia [facility])     Past Surgical History:  Procedure Laterality Date  . BIV ICD INSERTION CRT-D    . CORONARY ARTERY BYPASS GRAFT  2017  . MECHANICAL AORTIC VALVE REPLACEMENT      Current Outpatient Medications  Medication Sig Dispense Refill  . amiodarone (PACERONE) 200 MG tablet Take 0.5 tablets (100 mg total) by mouth daily. 15 tablet 11  . aspirin 81 MG tablet Take 1 tablet by mouth.    Marland Kitchen atorvastatin (LIPITOR) 40 MG tablet Take 1 tablet (40 mg total) by mouth at bedtime. 90 tablet 1  . carvedilol (COREG) 3.125 MG tablet Take 1 tablet (3.125 mg total) by mouth 2 (two) times daily with a meal. 60 tablet 3  . dapagliflozin propanediol (FARXIGA) 10 MG TABS tablet Take 10 mg by mouth daily.    . hydroxyurea (HYDREA) 500 MG capsule TAKE 1 CAPSULE BY MOUTH ALTERNATING WITH 2 CAPSULES DAILY(500 MG ONE DAY AND 1000 MG NEXT DAY). 135 capsule 3  .  levothyroxine (SYNTHROID) 175 MCG tablet Take 175 mcg by mouth daily before breakfast.    . sacubitril-valsartan (ENTRESTO) 24-26 MG Take 1 tablet by mouth daily.    Marland Kitchen spironolactone (ALDACTONE) 25 MG tablet Take 0.5 tablets (12.5 mg total) by mouth daily. 45 tablet 3  . warfarin (COUMADIN) 5 MG tablet Take as directed by coumadin clinic 30 tablet 2   No current facility-administered medications for this visit.    Allergies:   Erythromycin, Atorvastatin calcium, Rosuvastatin calcium, Simvastatin, and Zithromax [azithromycin]   Social History:  The patient  reports that he quit smoking about 3 years ago. His smoking use included  cigarettes. He started smoking about 40 years ago. He has a 30.00 pack-year smoking history. He has never used smokeless tobacco. He reports current alcohol use of about 14.0 standard drinks of alcohol per week. He reports that he does not use drugs.   Family History:  The patient's   family history includes Hypertension in his father.   ROS:  Please see the history of present illness.   All other systems are personally reviewed and negative.    Exam:    Vital Signs:  Ht 5\' 9"  (1.753 m)   Wt 160 lb (72.6 kg)   BMI 23.63 kg/m   Pulse 68 O2 Sat 98    Labs/Other Tests and Data Reviewed:    Recent Labs: 06/25/2019: Magnesium 2.2 10/06/2019: ALT 34; BUN 17; Creatinine, Ser 1.13; Hemoglobin 15.1; Platelets 400; Potassium 4.2; Sodium 138; TSH 1.575   Wt Readings from Last 3 Encounters:  02/05/20 160 lb (72.6 kg)  11/27/19 165 lb (74.8 kg)  10/06/19 171 lb 4 oz (77.7 kg)     Other studies personally reviewed: Additional studies/ records that were reviewed today include: As above   Last device remote is reviewed from [facility] PDF dated 9/21 which reveals normal device function,  Sustained  arrhythmias - PVCS W triggered pacing    PVP>> PVARP EXTENSION ASSESSMENT & PLAN:   Ischemic cardiomyopathy status post CABG  Aborted cardiac arrest  Valvular heart disease status post mechanical aortic/mitral valve replacement  ICD-[employer]-CRT-D    PVCs  Complete Heart Block  High Risk Medication Surveillance Amiodarone therapy  Thrombocytosis  On Hydroxyurea  Tolerating amio,  Thyroid and transaminase issues improved, but repeat testing warranted and scheduled with his PCP inabout 3 weeks  No VT/VF  PVC burden from real time recording this am suggests about 20% but counters ( 85% accurate on strip this am) say only <1%-- continue amio   Euvolemic by report, continue current meds  No bleeding *   COVID 19 screen The patient denies symptoms of COVID 19 at this  time.  The importance of social distancing was discussed today.  Follow-up:  35m Next remote: As Scheduled   Current medicines are reviewed at length with the patient today.   The patient has concerns regarding his medicines.  The following changes were made today:  Has stopped entresto 2/2 concerns re ED  He has stopped on his own, and have suggested that he stay OFF for about 3 -4 weeks and see what happens to the ED   If it improves we can think about ACE/ARB, if not have recommended he resume    Labs/ tests ordered today include: family MD to be drawing blood at the end of the month  Will arrange to get pt fax number so they can they be relayed  No orders of the defined types were placed in  this encounter.   Future tests ( post COVID )     Patient Risk:  after full review of this patients clinical status, I feel that they are at moderate risk at this time.  Today, I have spent 13 minutes with the patient with telehealth technology discussing the above.  Signed, Virl Axe, MD  02/05/2020 2:55 PM     [facility] [address] [place] [address] [phone] (office) [phone] (fax)

## 2020-02-06 NOTE — Progress Notes (Signed)
Remote ICD transmission.   

## 2020-02-12 ENCOUNTER — Ambulatory Visit (INDEPENDENT_AMBULATORY_CARE_PROVIDER_SITE_OTHER): Payer: Medicare Other | Admitting: *Deleted

## 2020-02-12 ENCOUNTER — Other Ambulatory Visit: Payer: Self-pay

## 2020-02-12 DIAGNOSIS — Z5181 Encounter for therapeutic drug level monitoring: Secondary | ICD-10-CM

## 2020-02-12 DIAGNOSIS — Z952 Presence of prosthetic heart valve: Secondary | ICD-10-CM

## 2020-02-12 LAB — POCT INR: INR: 2.3 (ref 2.0–3.0)

## 2020-02-12 NOTE — Patient Instructions (Signed)
Amiodarone 100mg  daily  Continue warfarin 1/2 tablet daily Continue greens and salads Recheck in 6 weeks  Coumadin Clinic [phone]

## 2020-02-24 ENCOUNTER — Other Ambulatory Visit (HOSPITAL_COMMUNITY): Payer: Self-pay

## 2020-02-24 MED ORDER — ATORVASTATIN CALCIUM 40 MG PO TABS: 40.0000 mg | ORAL_TABLET | Freq: Every day | ORAL | 1 refills | Status: DC

## 2020-03-01 ENCOUNTER — Other Ambulatory Visit: Payer: Self-pay | Admitting: Internal Medicine

## 2020-03-04 ENCOUNTER — Other Ambulatory Visit (HOSPITAL_COMMUNITY): Payer: Self-pay

## 2020-03-04 MED ORDER — CARVEDILOL 3.125 MG PO TABS: 3.1250 mg | ORAL_TABLET | Freq: Two times a day (BID) | ORAL | 3 refills | Status: DC

## 2020-03-25 ENCOUNTER — Ambulatory Visit (INDEPENDENT_AMBULATORY_CARE_PROVIDER_SITE_OTHER): Payer: Medicare Other | Admitting: *Deleted

## 2020-03-25 DIAGNOSIS — Z5181 Encounter for therapeutic drug level monitoring: Secondary | ICD-10-CM | POA: Diagnosis not present

## 2020-03-25 DIAGNOSIS — Z952 Presence of prosthetic heart valve: Secondary | ICD-10-CM | POA: Diagnosis not present

## 2020-03-25 LAB — POCT INR: INR: 1.4 — AB (ref 2.0–3.0)

## 2020-03-25 NOTE — Patient Instructions (Signed)
Amiodarone 100mg  daily  Take warfarin 1 tablet tonight and tomorrow night then resume 1/2 tablet daily Continue greens and salads Recheck in 2 weeks  Coumadin Clinic [phone]

## 2020-04-06 ENCOUNTER — Ambulatory Visit (INDEPENDENT_AMBULATORY_CARE_PROVIDER_SITE_OTHER): Payer: Medicare Other | Admitting: *Deleted

## 2020-04-06 DIAGNOSIS — Z952 Presence of prosthetic heart valve: Secondary | ICD-10-CM

## 2020-04-06 DIAGNOSIS — Z5181 Encounter for therapeutic drug level monitoring: Secondary | ICD-10-CM

## 2020-04-06 LAB — POCT INR: INR: 1.6 — AB (ref 2.0–3.0)

## 2020-04-06 NOTE — Patient Instructions (Signed)
Amiodarone 100mg  daily  Increase warfarin to 1/2 tablet daily except 1 tablet on Tuesdays and Fridays Continue greens and salads Recheck in 2 weeks  Coumadin Clinic [phone]

## 2020-04-20 ENCOUNTER — Other Ambulatory Visit: Payer: Self-pay

## 2020-04-20 ENCOUNTER — Ambulatory Visit (INDEPENDENT_AMBULATORY_CARE_PROVIDER_SITE_OTHER): Payer: Medicare Other | Admitting: *Deleted

## 2020-04-20 DIAGNOSIS — Z952 Presence of prosthetic heart valve: Secondary | ICD-10-CM

## 2020-04-20 DIAGNOSIS — Z5181 Encounter for therapeutic drug level monitoring: Secondary | ICD-10-CM | POA: Diagnosis not present

## 2020-04-20 DIAGNOSIS — I5022 Chronic systolic (congestive) heart failure: Secondary | ICD-10-CM

## 2020-04-20 LAB — POCT INR: INR: 1.8 — AB (ref 2.0–3.0)

## 2020-04-20 NOTE — Patient Instructions (Signed)
Amiodarone 100mg  daily  Take 1 1/2 tablets tonight then increase dose to 1/2 tablet daily except 1 tablet on Tuesdays, Thursdays and Saturdays Continue greens and salads Recheck in 3 weeks  Coumadin Clinic [phone]

## 2020-04-28 ENCOUNTER — Other Ambulatory Visit (HOSPITAL_COMMUNITY): Payer: Self-pay | Admitting: Cardiology

## 2020-04-30 ENCOUNTER — Other Ambulatory Visit (HOSPITAL_COMMUNITY): Payer: Self-pay | Admitting: *Deleted

## 2020-05-05 ENCOUNTER — Other Ambulatory Visit (HOSPITAL_COMMUNITY): Payer: Self-pay | Admitting: *Deleted

## 2020-05-05 ENCOUNTER — Other Ambulatory Visit (HOSPITAL_COMMUNITY): Payer: Self-pay

## 2020-05-05 MED ORDER — SPIRONOLACTONE 25 MG PO TABS
ORAL_TABLET | ORAL | 3 refills | Status: DC
Start: 2020-05-05 — End: 2021-01-28

## 2020-05-05 MED ORDER — DAPAGLIFLOZIN PROPANEDIOL 10 MG PO TABS
10.0000 mg | ORAL_TABLET | Freq: Every day | ORAL | 3 refills | Status: DC
Start: 2020-05-05 — End: 2020-05-06

## 2020-05-05 MED ORDER — HYDROXYUREA 500 MG PO CAPS
ORAL_CAPSULE | ORAL | 3 refills | Status: DC
Start: 2020-05-05 — End: 2021-05-10

## 2020-05-06 ENCOUNTER — Telehealth (HOSPITAL_COMMUNITY): Payer: Self-pay | Admitting: Pharmacy Technician

## 2020-05-06 ENCOUNTER — Telehealth (HOSPITAL_COMMUNITY): Payer: Self-pay | Admitting: Pharmacist

## 2020-05-06 ENCOUNTER — Ambulatory Visit (INDEPENDENT_AMBULATORY_CARE_PROVIDER_SITE_OTHER): Payer: Medicare Other

## 2020-05-06 ENCOUNTER — Other Ambulatory Visit (HOSPITAL_COMMUNITY): Payer: Self-pay | Admitting: *Deleted

## 2020-05-06 DIAGNOSIS — I255 Ischemic cardiomyopathy: Secondary | ICD-10-CM | POA: Diagnosis not present

## 2020-05-06 LAB — CUP PACEART REMOTE DEVICE CHECK
Battery Remaining Longevity: 90 mo
Battery Remaining Percentage: 100 %
Brady Statistic RA Percent Paced: 1 %
Brady Statistic RV Percent Paced: 86 %
Date Time Interrogation Session: 20211209005400
HighPow Impedance: 64 Ohm
Implantable Lead Implant Date: 20180105
Implantable Lead Implant Date: 20180105
Implantable Lead Implant Date: 20180105
Implantable Lead Location: 753858
Implantable Lead Location: 753859
Implantable Lead Location: 753860
Implantable Lead Model: 292
Implantable Lead Model: 4672
Implantable Lead Model: 7741
Implantable Lead Serial Number: 428973
Implantable Lead Serial Number: 801088
Implantable Lead Serial Number: 849519
Implantable Pulse Generator Implant Date: 20180105
Lead Channel Impedance Value: 588 Ohm
Lead Channel Impedance Value: 680 Ohm
Lead Channel Impedance Value: 742 Ohm
Lead Channel Pacing Threshold Amplitude: 0.7 V
Lead Channel Pacing Threshold Amplitude: 0.8 V
Lead Channel Pacing Threshold Amplitude: 1 V
Lead Channel Pacing Threshold Pulse Width: 0.4 ms
Lead Channel Pacing Threshold Pulse Width: 0.4 ms
Lead Channel Pacing Threshold Pulse Width: 1 ms
Lead Channel Setting Pacing Amplitude: 2 V
Lead Channel Setting Pacing Amplitude: 2 V
Lead Channel Setting Pacing Amplitude: 2.2 V
Lead Channel Setting Pacing Pulse Width: 0.4 ms
Lead Channel Setting Pacing Pulse Width: 1 ms
Lead Channel Setting Sensing Sensitivity: 0.6 mV
Lead Channel Setting Sensing Sensitivity: 1 mV
Pulse Gen Serial Number: 180139

## 2020-05-06 MED ORDER — AMIODARONE HCL 100 MG PO TABS
100.0000 mg | ORAL_TABLET | Freq: Every day | ORAL | 3 refills | Status: DC
Start: 2020-05-06 — End: 2020-05-06

## 2020-05-06 MED ORDER — DAPAGLIFLOZIN PROPANEDIOL 10 MG PO TABS
10.0000 mg | ORAL_TABLET | Freq: Every day | ORAL | 11 refills | Status: DC
Start: 2020-05-06 — End: 2020-05-06

## 2020-05-06 MED ORDER — AMIODARONE HCL 100 MG PO TABS
100.0000 mg | ORAL_TABLET | Freq: Every day | ORAL | 3 refills | Status: DC
Start: 2020-05-06 — End: 2020-05-07

## 2020-05-06 MED ORDER — DAPAGLIFLOZIN PROPANEDIOL 10 MG PO TABS
10.0000 mg | ORAL_TABLET | Freq: Every day | ORAL | 3 refills | Status: DC
Start: 2020-05-06 — End: 2021-07-06

## 2020-05-06 NOTE — Telephone Encounter (Signed)
Saul Fordyce prescription sent to [facility] ([facility]).

## 2020-05-06 NOTE — Telephone Encounter (Signed)
Patient called in wanting a refill of Farxiga. Looks like it was sent to the local pharmacy instead of [facility]. Called and spoke with patient. I was able to conference call [facility] with the patient and complete his 2022 enrollment over the phone.  Sent CMA a message requesting that the refill be sent to [facility].  Started a Network engineer for [employer]. Will be at the check in desk for the patient to sign.   Will follow up.  Advanced Heart Failure Patient Advocate Encounter   Patient was approved to receive Farxiga from [facility]  Effective dates: 04/06/20 through 04/06/21

## 2020-05-07 ENCOUNTER — Other Ambulatory Visit (HOSPITAL_COMMUNITY): Payer: Self-pay

## 2020-05-07 MED ORDER — AMIODARONE HCL 200 MG PO TABS: 100.0000 mg | ORAL_TABLET | Freq: Every day | ORAL | 3 refills | Status: DC

## 2020-05-07 NOTE — Telephone Encounter (Signed)
Patients pharmacy called requesting that we send in a Rx for Amiodarone 200mg  instead of the 100mg  tablets due to patients insurance. Rx sent.

## 2020-05-12 ENCOUNTER — Ambulatory Visit (INDEPENDENT_AMBULATORY_CARE_PROVIDER_SITE_OTHER): Payer: Medicare Other | Admitting: *Deleted

## 2020-05-12 DIAGNOSIS — Z5181 Encounter for therapeutic drug level monitoring: Secondary | ICD-10-CM

## 2020-05-12 DIAGNOSIS — Z952 Presence of prosthetic heart valve: Secondary | ICD-10-CM

## 2020-05-12 LAB — POCT INR: INR: 3.6 — AB (ref 2.0–3.0)

## 2020-05-12 NOTE — Patient Instructions (Signed)
Amiodarone 100mg  daily  Hold warfarin tonight then resume 1/2 tablet daily except 1 tablet on Tuesdays, Thursdays and Saturdays Continue greens and salads Recheck in 4 weeks  Coumadin Clinic [phone]

## 2020-05-17 ENCOUNTER — Emergency Department (HOSPITAL_BASED_OUTPATIENT_CLINIC_OR_DEPARTMENT_OTHER): Payer: Medicare Other

## 2020-05-17 ENCOUNTER — Emergency Department (HOSPITAL_BASED_OUTPATIENT_CLINIC_OR_DEPARTMENT_OTHER)
Admission: EM | Admit: 2020-05-17 | Discharge: 2020-05-17 | Disposition: A | Discharge status: Home / Self Care | Payer: Medicare Other | Attending: Emergency Medicine | Admitting: Emergency Medicine

## 2020-05-17 ENCOUNTER — Encounter (HOSPITAL_BASED_OUTPATIENT_CLINIC_OR_DEPARTMENT_OTHER): Payer: Self-pay | Admitting: *Deleted

## 2020-05-17 ENCOUNTER — Other Ambulatory Visit: Payer: Self-pay

## 2020-05-17 DIAGNOSIS — I5022 Chronic systolic (congestive) heart failure: Secondary | ICD-10-CM | POA: Diagnosis not present

## 2020-05-17 DIAGNOSIS — Z7982 Long term (current) use of aspirin: Secondary | ICD-10-CM | POA: Diagnosis not present

## 2020-05-17 DIAGNOSIS — I110 Hypertensive heart disease with heart failure: Secondary | ICD-10-CM | POA: Diagnosis not present

## 2020-05-17 DIAGNOSIS — I2510 Atherosclerotic heart disease of native coronary artery without angina pectoris: Secondary | ICD-10-CM | POA: Diagnosis not present

## 2020-05-17 DIAGNOSIS — Z20822 Contact with and (suspected) exposure to covid-19: Secondary | ICD-10-CM | POA: Insufficient documentation

## 2020-05-17 DIAGNOSIS — Z87891 Personal history of nicotine dependence: Secondary | ICD-10-CM | POA: Diagnosis not present

## 2020-05-17 DIAGNOSIS — J069 Acute upper respiratory infection, unspecified: Secondary | ICD-10-CM | POA: Diagnosis not present

## 2020-05-17 DIAGNOSIS — Z7901 Long term (current) use of anticoagulants: Secondary | ICD-10-CM | POA: Insufficient documentation

## 2020-05-17 DIAGNOSIS — Z79899 Other long term (current) drug therapy: Secondary | ICD-10-CM | POA: Diagnosis not present

## 2020-05-17 DIAGNOSIS — R059 Cough, unspecified: Secondary | ICD-10-CM | POA: Diagnosis present

## 2020-05-17 LAB — RESP PANEL BY RT-PCR (FLU A&B, COVID) ARPGX2
Influenza A by PCR: NEGATIVE
Influenza B by PCR: NEGATIVE
SARS Coronavirus 2 by RT PCR: NEGATIVE

## 2020-05-17 NOTE — ED Provider Notes (Signed)
[facility] EMERGENCY DEPARTMENT Provider Note   CSN: [id number] Arrival date & time: 05/17/20  1144     History Chief Complaint  Patient presents with  . Cough  . Nasal Congestion    Taylor [name] is a 69 y.o. male.  The history is provided by the patient. No language interpreter was used.  Cough Cough characteristics:  Non-productive Sputum characteristics:  Nondescript Severity:  Moderate Onset quality:  Gradual Timing:  Constant Progression:  Worsening Chronicity:  New Smoker: no   Context: not sick contacts   Relieved by:  Nothing Worsened by:  Nothing Ineffective treatments:  None tried Associated symptoms: no fever   Risk factors: no recent infection   Pt worried that he could have the flu.  Pt reports nasal congestion    Past Medical History:  Diagnosis Date  . Atherosclerotic heart disease of native coronary artery without angina pectoris   . Chronic anticoagulation 02/18/2018  . Chronic systolic heart failure ([place]) 02/02/2017  . Essential thrombocythemia ([place]) 02/15/2017  . Hyperlipidemia   . Hypertension   . Ischemic cardiomyopathy   . Leg pain, central, left 04/17/2017   Hx sciatica - pain similar; no red flag signs on hx or exam   . Presence of other heart-valve replacement   . Ventricular tachycardia [facility])     Patient Active Problem List   Diagnosis Date Noted  . Ischemic cardiomyopathy 08/04/2019  . ICD (implantable cardioverter-defibrillator) in place 08/04/2019  . Essential hypertension 06/23/2019  . Sepsis ([place]) 06/23/2019  . AKI (acute kidney injury) ([place]) 06/23/2019  . Hyponatremia 06/23/2019  . Normal anion gap metabolic acidosis 69/62/9528  . Normocytic anemia 06/23/2019  . Acute cholecystitis 06/22/2019  . Chronic anticoagulation 02/18/2018  . Leg pain, central, left 04/17/2017  . Essential thrombocythemia ([place]) 02/15/2017  . Encounter for therapeutic drug monitoring 02/15/2017  . Aortic valve replaced 02/15/2017  .  Chronic systolic heart failure ([place]) 02/02/2017    Past Surgical History:  Procedure Laterality Date  . BIV ICD INSERTION CRT-D    . CORONARY ARTERY BYPASS GRAFT  2017  . MECHANICAL AORTIC VALVE REPLACEMENT         Family History  Problem Relation Age of Onset  . Hypertension Father     Social History   Tobacco Use  . Smoking status: Former Smoker    Packs/day: 1.00    Years: 30.00    Pack years: 30.00    Types: Cigarettes    Start date: 05/26/1979    Quit date: 05/18/2016    Years since quitting: 4.0  . Smokeless tobacco: Never Used  Vaping Use  . Vaping Use: Never used  Substance Use Topics  . Alcohol use: Yes    Alcohol/week: 14.0 standard drinks    Types: 14 Cans of beer per week    Comment: 2 cans a day..  . Drug use: No    Home Medications Prior to Admission medications   Medication Sig Start Date End Date Taking? Authorizing Provider  amiodarone (PACERONE) 200 MG tablet Take 0.5 tablets (100 mg total) by mouth daily. 05/07/20   Bensimhon, Shaune Pascal, MD  aspirin 81 MG tablet Take 1 tablet by mouth.    [provider]  atorvastatin (LIPITOR) 40 MG tablet Take 1 tablet (40 mg total) by mouth at bedtime. 02/24/20 05/24/20  Larey Dresser, MD  carvedilol (COREG) 3.125 MG tablet Take 1 tablet (3.125 mg total) by mouth 2 (two) times daily with a meal. 03/04/20   Bensimhon, Quillian Quince  R, MD  dapagliflozin propanediol (FARXIGA) 10 MG TABS tablet Take 1 tablet (10 mg total) by mouth daily. 05/06/20   Bensimhon, Shaune Pascal, MD  hydroxyurea (HYDREA) 500 MG capsule TAKE 1 CAPSULE BY MOUTH ALTERNATING WITH 2 CAPSULES DAILY(500 MG ONE DAY AND 1000 MG NEXT DAY). 05/05/20   Bensimhon, Shaune Pascal, MD  levothyroxine (SYNTHROID) 175 MCG tablet Take 175 mcg by mouth daily before breakfast.    [provider]  sacubitril-valsartan (ENTRESTO) 24-26 MG Take 1 tablet by mouth daily.    [provider]  spironolactone (ALDACTONE) 25 MG tablet TAKE 1/2 TABLET(12.5 MG) BY  MOUTH DAILY 05/05/20   Lyda Jester M, PA-C  warfarin (COUMADIN) 5 MG tablet TAKE AS DIRECTED BY COUMADIN CLINIC 03/02/20   Bensimhon, Shaune Pascal, MD    Allergies    Erythromycin, Atorvastatin calcium, Rosuvastatin calcium, Simvastatin, and Zithromax [azithromycin]  Review of Systems   Review of Systems  Constitutional: Negative for fever.  Respiratory: Positive for cough.   All other systems reviewed and are negative.   Physical Exam Updated Vital Signs BP 123/76 (BP Location: Right Arm)   Pulse 75   Temp 97.9 F (36.6 C) (Oral)   Resp 20   Ht 5\' 9"  (1.753 m)   Wt 77.1 kg   BMI 25.10 kg/m   Physical Exam Vitals and nursing note reviewed.  Constitutional:      Appearance: He is well-developed and well-nourished.  HENT:     Head: Normocephalic.     Nose: Nose normal.  Eyes:     Extraocular Movements: EOM normal.     Pupils: Pupils are equal, round, and reactive to light.  Cardiovascular:     Rate and Rhythm: Normal rate.  Pulmonary:     Effort: Pulmonary effort is normal.  Abdominal:     General: There is no distension.  Musculoskeletal:        General: Normal range of motion.     Cervical back: Normal range of motion.  Neurological:     Mental Status: He is alert and oriented to person, place, and time.  Psychiatric:        Mood and Affect: Mood and affect normal.     ED Results / Procedures / Treatments   Labs (all labs ordered are listed, but only abnormal results are displayed) Labs Reviewed  RESP PANEL BY RT-PCR (FLU A&B, COVID) [id number]    EKG None  Radiology DG Chest Port 1 View  Result Date: 05/17/2020 CLINICAL DATA:  Cough and congestion for 2 days EXAM: PORTABLE CHEST 1 VIEW COMPARISON:  06/22/2019 FINDINGS: Cardiac shadow is mildly prominent but stable. Postsurgical changes are seen. Defibrillator is noted and stable. The lungs are well aerated bilaterally. Mild scarring is noted in the left lung base stable from the prior exam. No focal  infiltrate is seen. No bony abnormality is noted. IMPRESSION: Mild left basilar scarring chronic in appearance. Electronically Signed   By: Inez Catalina M.D.   On: 05/17/2020 15:04    Procedures Procedures (including critical care time)  Medications Ordered in ED Medications - No data to display  ED Course  I have reviewed the triage vital signs and the nursing notes.  Pertinent labs & imaging results that were available during my care of the patient were reviewed by me and considered in my medical decision making (see chart for details).    MDM Rules/Calculators/A&P  MDM:  Covid and influenza are negative  Final Clinical Impression(s) / ED Diagnoses Final diagnoses:  Viral URI with cough   An After Visit Summary was printed and given to the patient.   Rx / DC Orders ED Discharge Orders    None       Sidney Ace 05/17/20 1530    Hayden Rasmussen, MD 05/17/20 1730

## 2020-05-17 NOTE — ED Triage Notes (Signed)
Cough and nasal congestion for 2 days.  No fever.  Denies n/v.

## 2020-05-17 NOTE — Discharge Instructions (Addendum)
See your Physician for recheck.  Return if any problems.  °

## 2020-05-17 NOTE — ED Notes (Signed)
Pt here for cough, non-productive for the past few days. Alert and oriented, resp WNL, appears in no distress at this time, GCS 15

## 2020-05-17 NOTE — ED Notes (Signed)
Cont to await for ED Provider evaluation

## 2020-05-18 NOTE — Progress Notes (Signed)
Remote ICD transmission.   

## 2020-06-02 NOTE — Telephone Encounter (Signed)
Sent in application via fax.  Will follow up.  

## 2020-06-09 ENCOUNTER — Ambulatory Visit: Payer: Medicare Other | Admitting: Physician Assistant

## 2020-06-09 ENCOUNTER — Other Ambulatory Visit: Payer: Self-pay

## 2020-06-09 ENCOUNTER — Ambulatory Visit (HOSPITAL_COMMUNITY)
Admission: RE | Admit: 2020-06-09 | Discharge: 2020-06-09 | Disposition: A | Discharge status: Home / Self Care | Payer: Medicare Other | Source: Ambulatory Visit | Attending: Physician Assistant | Admitting: Physician Assistant

## 2020-06-09 VITALS — BP 127/85 | HR 69 | Temp 98.0°F | Resp 18 | Ht 69.0 in | Wt 169.0 lb

## 2020-06-09 DIAGNOSIS — I714 Abdominal aortic aneurysm, without rupture, unspecified: Secondary | ICD-10-CM

## 2020-06-09 NOTE — Progress Notes (Signed)
Office Note     CC:  follow up Requesting Provider:  Michell Heinrich, DO  HPI: Taylor [name] is a 70 y.o. ([birth year]) male who presents for surveillance follow up of AAA. His AAA was found incidentally in 2019. He was last seen by Dr. Oneida Alar in July of 2021 for follow up. He had a CT scan of the chest abdomen and pelvis January 2021 this was done for abdominal pain.  Aneurysm diameter at that point was 4.6 cm. On duplex in the office it too showed a 4.6 cm infrarenal AAA. He was felt to be stable and 6 month follow up was recommended  He presents today for that follow up. He denies any abdominal or back pain. He does have some weakness in his legs on ambulation approximately .5 miles. If he stops and rests a minute it goes away and he can continue walking. He and his girlfriend just started walking for exercise 1 mile a day, which he is overall tolerating well. He denies any rest pain or non healing wounds  Patients PMhx, Family hx, Surgical Hx, and Social hx reviewed and have not changed since his last visit in July 2021  He has a left sided AICD. Prior aortic valve replacement on Warfarin  The pt is on a statin for cholesterol management.  The pt is on a daily aspirin.   Other AC:  Coumadin The pt is on CCB, BB, Entresto for hypertension.   The pt is not diabetic. Tobacco hx: Former smoker, quit 2017  Past Medical History:  Diagnosis Date  . Atherosclerotic heart disease of native coronary artery without angina pectoris   . Chronic anticoagulation 02/18/2018  . Chronic systolic heart failure ([place]) 02/02/2017  . Essential thrombocythemia ([place]) 02/15/2017  . Hyperlipidemia   . Hypertension   . Ischemic cardiomyopathy   . Leg pain, central, left 04/17/2017   Hx sciatica - pain similar; no red flag signs on hx or exam   . Presence of other heart-valve replacement   . Ventricular tachycardia [facility])     Past Surgical History:  Procedure Laterality Date  . BIV ICD INSERTION CRT-D    .  CORONARY ARTERY BYPASS GRAFT  2017  . MECHANICAL AORTIC VALVE REPLACEMENT      Social History   Socioeconomic History  . Marital status: Divorced    Spouse name: Not on file  . Number of children: Not on file  . Years of education: Not on file  . Highest education level: Not on file  Occupational History  . Not on file  Tobacco Use  . Smoking status: Former Smoker    Packs/day: 1.00    Years: 30.00    Pack years: 30.00    Types: Cigarettes    Start date: 05/26/1979    Quit date: 05/18/2016    Years since quitting: 4.0  . Smokeless tobacco: Never Used  Vaping Use  . Vaping Use: Never used  Substance and Sexual Activity  . Alcohol use: Yes    Alcohol/week: 14.0 standard drinks    Types: 14 Cans of beer per week    Comment: 2 cans a day..  . Drug use: No  . Sexual activity: Not on file  Other Topics Concern  . Not on file  Social History Narrative  . Not on file   Social Determinants of Health   Financial Resource Strain: Not on file  Food Insecurity: Not on file  Transportation Needs: Not on file  Physical Activity: Not on file  Stress: Not on file  Social Connections: Not on file  Intimate Partner Violence: Not on file    Family History  Problem Relation Age of Onset  . Hypertension Father     Current Outpatient Medications  Medication Sig Dispense Refill  . amiodarone (PACERONE) 200 MG tablet Take 0.5 tablets (100 mg total) by mouth daily. 45 tablet 3  . aspirin 81 MG tablet Take 1 tablet by mouth.    Marland Kitchen atorvastatin (LIPITOR) 40 MG tablet Take 1 tablet (40 mg total) by mouth at bedtime. 90 tablet 1  . carvedilol (COREG) 3.125 MG tablet Take 1 tablet (3.125 mg total) by mouth 2 (two) times daily with a meal. 60 tablet 3  . dapagliflozin propanediol (FARXIGA) 10 MG TABS tablet Take 1 tablet (10 mg total) by mouth daily. 90 tablet 3  . hydroxyurea (HYDREA) 500 MG capsule TAKE 1 CAPSULE BY MOUTH ALTERNATING WITH 2 CAPSULES DAILY(500 MG ONE DAY AND 1000 MG NEXT  DAY). 135 capsule 3  . levothyroxine (SYNTHROID) 175 MCG tablet Take 175 mcg by mouth daily before breakfast.    . sacubitril-valsartan (ENTRESTO) 24-26 MG Take 1 tablet by mouth daily.    Marland Kitchen spironolactone (ALDACTONE) 25 MG tablet TAKE 1/2 TABLET(12.5 MG) BY MOUTH DAILY 45 tablet 3  . warfarin (COUMADIN) 5 MG tablet TAKE AS DIRECTED BY COUMADIN CLINIC 30 tablet 2   No current facility-administered medications for this visit.    Allergies  Allergen Reactions  . Erythromycin Rash  . Atorvastatin Calcium Other (See Comments)  . Rosuvastatin Calcium     Unknown  . Simvastatin     Unknown  . Zithromax [Azithromycin] Rash     REVIEW OF SYSTEMS:  [X]  denotes positive finding, [ ]  denotes negative finding Cardiac  Comments:  Chest pain or chest pressure:    Shortness of breath upon exertion:    Short of breath when lying flat:    Irregular heart rhythm:        Vascular    Pain in calf, thigh, or hip brought on by ambulation:    Pain in feet at night that wakes you up from your sleep:     Blood clot in your veins:    Leg swelling:         Pulmonary    Oxygen at home:    Productive cough:     Wheezing:         Neurologic    Sudden weakness in arms or legs:     Sudden numbness in arms or legs:     Sudden onset of difficulty speaking or slurred speech:    Temporary loss of vision in one eye:     Problems with dizziness:         Gastrointestinal    Blood in stool:     Vomited blood:         Genitourinary    Burning when urinating:     Blood in urine:        Psychiatric    Major depression:         Hematologic    Bleeding problems:    Problems with blood clotting too easily:        Skin    Rashes or ulcers:        Constitutional    Fever or chills:      PHYSICAL EXAMINATION:  Vitals:   06/09/20 0941  BP: 127/85  Pulse: 69  Resp: 18  Temp: 98 F (36.7 C)  TempSrc: Temporal  SpO2: 100%  Weight: 169 lb (76.7 kg)  Height: 5\' 9"  (1.753 m)    General:   WDWN in NAD; vital signs documented above Gait: Normal HENT: WNL, normocephalic Pulmonary: normal non-labored breathing , without wheezing Cardiac: regular HR, without  Murmurs without carotid bruit Abdomen: soft, NT, no masses. No palpable aneurysm. Normal bowel sounds Vascular Exam/Pulses:  Right Left  Radial 2+ (normal) 2+ (normal)  Femoral 2+ (normal) 2+ (normal)  Popliteal Not palpable Not palpable  DP Not palpable Not palpable  PT Not palpable Not palpable   Extremities: without ischemic changes, without Gangrene , without cellulitis; without open wounds;  Musculoskeletal: no muscle wasting or atrophy  Neurologic: A&O X 3;  No focal weakness or paresthesias are detected Psychiatric:  The pt has Normal affect.   Non-Invasive Vascular Imaging:   AAA duplex today shows 4.5 cm AP and Trans diameters. There is abnomal dilatation of the R and L common iliac arteries as well which is essentially unchanged from prior study on 11/27/19  ASSESSMENT/PLAN:: 70 y.o. male here for follow up for surveillance of infrarenal Abdominal Aortic aneurysm. Duplex today shows a stable 4.5 cm aneurysm which is essentially unchanged from CT and duplex evaluation 6 months ago. The iliac arteries are also stable in size on duplex today compared to last study. He presently is without any associated symptoms - Discussed importance of continued adequate blood pressure control - He will remain on Aspirin and Statin - Consideration for repair of AAA would be made when the size is 5.5cm, growth > 1 cm/yr, and symptomatic status - He will follow up again in 6 months with repeat AAA duplex   Karoline Caldwell, PA-C [facility] [phone]  Clinic MD:  Dr. Scot Dock

## 2020-06-10 ENCOUNTER — Ambulatory Visit (INDEPENDENT_AMBULATORY_CARE_PROVIDER_SITE_OTHER): Payer: Medicare Other | Admitting: *Deleted

## 2020-06-10 DIAGNOSIS — Z952 Presence of prosthetic heart valve: Secondary | ICD-10-CM

## 2020-06-10 DIAGNOSIS — Z5181 Encounter for therapeutic drug level monitoring: Secondary | ICD-10-CM

## 2020-06-10 LAB — POCT INR: INR: 2.8 (ref 2.0–3.0)

## 2020-06-10 NOTE — Patient Instructions (Signed)
Amiodarone 100mg  daily  Continue warfarin 1/2 tablet daily except 1 tablet on Tuesdays, Thursdays and Saturdays Continue greens and salads Recheck in 4 weeks  Coumadin Clinic [phone]

## 2020-06-17 ENCOUNTER — Telehealth (HOSPITAL_COMMUNITY): Payer: Self-pay | Admitting: Pharmacist

## 2020-06-17 NOTE — Telephone Encounter (Signed)
Re-faxed prescriber portion of [employer] patient assistance application.   Audry Riles, PharmD, BCPS, BCCP, CPP Heart Failure Clinic Pharmacist [phone]

## 2020-06-24 ENCOUNTER — Encounter (HOSPITAL_COMMUNITY): Payer: Self-pay | Admitting: Internal Medicine

## 2020-06-24 ENCOUNTER — Other Ambulatory Visit: Payer: Self-pay

## 2020-06-24 ENCOUNTER — Ambulatory Visit (HOSPITAL_COMMUNITY)
Admission: RE | Admit: 2020-06-24 | Discharge: 2020-06-24 | Disposition: A | Discharge status: Home / Self Care | Payer: Medicare Other | Source: Ambulatory Visit | Attending: Internal Medicine | Admitting: Internal Medicine

## 2020-06-24 VITALS — BP 108/62 | HR 71 | Wt 176.4 lb

## 2020-06-24 DIAGNOSIS — Z951 Presence of aortocoronary bypass graft: Secondary | ICD-10-CM | POA: Diagnosis not present

## 2020-06-24 DIAGNOSIS — Z7984 Long term (current) use of oral hypoglycemic drugs: Secondary | ICD-10-CM | POA: Insufficient documentation

## 2020-06-24 DIAGNOSIS — Z7982 Long term (current) use of aspirin: Secondary | ICD-10-CM | POA: Diagnosis not present

## 2020-06-24 DIAGNOSIS — Z87891 Personal history of nicotine dependence: Secondary | ICD-10-CM | POA: Diagnosis not present

## 2020-06-24 DIAGNOSIS — Z881 Allergy status to other antibiotic agents status: Secondary | ICD-10-CM | POA: Diagnosis not present

## 2020-06-24 DIAGNOSIS — Z8674 Personal history of sudden cardiac arrest: Secondary | ICD-10-CM | POA: Diagnosis not present

## 2020-06-24 DIAGNOSIS — Z79899 Other long term (current) drug therapy: Secondary | ICD-10-CM | POA: Insufficient documentation

## 2020-06-24 DIAGNOSIS — I714 Abdominal aortic aneurysm, without rupture, unspecified: Secondary | ICD-10-CM

## 2020-06-24 DIAGNOSIS — K8000 Calculus of gallbladder with acute cholecystitis without obstruction: Secondary | ICD-10-CM | POA: Insufficient documentation

## 2020-06-24 DIAGNOSIS — I2510 Atherosclerotic heart disease of native coronary artery without angina pectoris: Secondary | ICD-10-CM | POA: Insufficient documentation

## 2020-06-24 DIAGNOSIS — I5022 Chronic systolic (congestive) heart failure: Secondary | ICD-10-CM | POA: Insufficient documentation

## 2020-06-24 DIAGNOSIS — Z79890 Hormone replacement therapy (postmenopausal): Secondary | ICD-10-CM | POA: Diagnosis not present

## 2020-06-24 DIAGNOSIS — Z86718 Personal history of other venous thrombosis and embolism: Secondary | ICD-10-CM | POA: Insufficient documentation

## 2020-06-24 DIAGNOSIS — I110 Hypertensive heart disease with heart failure: Secondary | ICD-10-CM | POA: Insufficient documentation

## 2020-06-24 DIAGNOSIS — I493 Ventricular premature depolarization: Secondary | ICD-10-CM | POA: Diagnosis not present

## 2020-06-24 DIAGNOSIS — Z7901 Long term (current) use of anticoagulants: Secondary | ICD-10-CM | POA: Diagnosis not present

## 2020-06-24 DIAGNOSIS — Z95810 Presence of automatic (implantable) cardiac defibrillator: Secondary | ICD-10-CM | POA: Diagnosis not present

## 2020-06-24 DIAGNOSIS — Z952 Presence of prosthetic heart valve: Secondary | ICD-10-CM | POA: Diagnosis not present

## 2020-06-24 DIAGNOSIS — Z8774 Personal history of (corrected) congenital malformations of heart and circulatory system: Secondary | ICD-10-CM | POA: Insufficient documentation

## 2020-06-24 LAB — LIPID PANEL
Cholesterol: 174 mg/dL (ref 0–200)
HDL: 40 mg/dL — ABNORMAL LOW (ref >40)
LDL Cholesterol: 96 mg/dL (ref 0–99)
Total CHOL/HDL Ratio: 4.4 RATIO
Triglycerides: 190 mg/dL — ABNORMAL HIGH (ref <150)
VLDL: 38 mg/dL (ref 0–40)

## 2020-06-24 LAB — PROTIME-INR
INR: 1.9 — ABNORMAL HIGH (ref 0.8–1.2)
Prothrombin Time: 21.3 seconds — ABNORMAL HIGH (ref 11.4–15.2)

## 2020-06-24 LAB — CBC
HCT: 40.3 % (ref 39.0–52.0)
Hemoglobin: 13.7 g/dL (ref 13.0–17.0)
MCH: 37.8 pg — ABNORMAL HIGH (ref 26.0–34.0)
MCHC: 34 g/dL (ref 30.0–36.0)
MCV: 111.3 fL — ABNORMAL HIGH (ref 80.0–100.0)
Platelets: 501 10*3/uL — ABNORMAL HIGH (ref 150–400)
RBC: 3.62 MIL/uL — ABNORMAL LOW (ref 4.22–5.81)
RDW: 13.6 % (ref 11.5–15.5)
WBC: 4.9 10*3/uL (ref 4.0–10.5)
nRBC: 0 % (ref 0.0–0.2)

## 2020-06-24 LAB — T4, FREE: Free T4: 1.14 ng/dL — ABNORMAL HIGH (ref 0.61–1.12)

## 2020-06-24 LAB — COMPREHENSIVE METABOLIC PANEL
ALT: 18 U/L (ref 0–44)
AST: 24 U/L (ref 15–41)
Albumin: 3.7 g/dL (ref 3.5–5.0)
Alkaline Phosphatase: 54 U/L (ref 38–126)
Anion gap: 9 (ref 5–15)
BUN: 23 mg/dL (ref 8–23)
CO2: 24 mmol/L (ref 22–32)
Calcium: 9.4 mg/dL (ref 8.9–10.3)
Chloride: 103 mmol/L (ref 98–111)
Creatinine, Ser: 1.15 mg/dL (ref 0.61–1.24)
GFR, Estimated: >60 mL/min (ref >60)
Glucose, Bld: 92 mg/dL (ref 70–99)
Potassium: 4.7 mmol/L (ref 3.5–5.1)
Sodium: 136 mmol/L (ref 135–145)
Total Bilirubin: 0.9 mg/dL (ref 0.3–1.2)
Total Protein: 7.3 g/dL (ref 6.5–8.1)

## 2020-06-24 LAB — TSH: TSH: 5.002 u[IU]/mL — ABNORMAL HIGH (ref 0.350–4.500)

## 2020-06-24 NOTE — Addendum Note (Signed)
Encounter addended by: Stanford Scotland, RN on: 06/24/2020 11:01 AM  Actions taken: Order list changed, Diagnosis association updated, Charge Capture section accepted, Clinical Note Signed

## 2020-06-24 NOTE — Progress Notes (Signed)
Advanced Heart Failure Clinic Note   Referring Physician: PCP: Michell Heinrich, DO PCP-Cardiologist: Dr. Haroldine Laws   HPI:  Taylor [name] a 70 y.o.malewith mechnical AVR on  chronic coumadin, h/o VF arrest s/p ICD , CAD s/p CABG, chronic systolic HF (EF 23-55%), AAA and h/o essential thrombosis.   Has h/o congenital AoV abnormality with AI. In 1994 had MI and underwent PCI (unclear artery) and mechanical AVR at [facility].   Has h/o polysubstance abuse with cocaine, ETOH and tobacco abuse. No longer doing cocaine or smoking. Now drinks about 2 beers per day.  Did well from heart perspective until 2017, when he had VF arrest on 05/18/16. Found to have 3v CAD and MV disease and underwent CABG x 3 with re-do mechanical AVR. Post-operatively had AF (treated with amio) and CHB requiring implantation of a BosSci CRT-D device.This is followed by Dr. Caryl Comes. Coumadin Clinic at [facility] [address] follows INR. INR goal 2.0-3.0.   Echo on 12/18 showed LVEF of 25-30% stable AVR  Had CT on 12/18 for his back. Showed 3.7cm AAA.Had f/u US 05/2018 that showedmildincrease in size of the known distal fusiform abdominal aortic aneurysm, measuring 4.2 cm in maximum diameter. He was referred to [facility] and he is now followed by Dr. Oneida Alar.   Most recent echo 02/2019 showed EF 20-25%. RV normal. A  He was admitted to [facility] on 1/21 w/ acute cholecystitis and sepsis. Hypotensive on admit w/ AKI and HF meds held. SCr bumped to 1.7 (baseline 1.1). Treated w/ IVFs and abx. Blood cultures negative. General surgery consulted and plans were for percutaneous cholecystostomy w/ drain, however HIDA scan was negative and he improved symptomatically w/ conservative therapy.   He presents to clinic today for f/u. Had [place] in 8/21. Mild case. Has moved from [place] to [place]. Walking 1 mile every few days with rest breaks. No CP. No edema, orthopnea or PND. No problems with his gall bladder. Down to 1 beer per day     Past  Medical History:  Diagnosis Date  . Atherosclerotic heart disease of native coronary artery without angina pectoris   . Chronic anticoagulation 02/18/2018  . Chronic systolic heart failure ([place]) 02/02/2017  . Essential thrombocythemia ([place]) 02/15/2017  . Hyperlipidemia   . Hypertension   . Ischemic cardiomyopathy   . Leg pain, central, left 04/17/2017   Hx sciatica - pain similar; no red flag signs on hx or exam   . Presence of other heart-valve replacement   . Ventricular tachycardia ([place])     Current Outpatient Medications  Medication Sig Dispense Refill  . amiodarone (PACERONE) 200 MG tablet Take 0.5 tablets (100 mg total) by mouth daily. 45 tablet 3  . aspirin 81 MG tablet Take 1 tablet by mouth.    Marland Kitchen atorvastatin (LIPITOR) 40 MG tablet Take 1 tablet (40 mg total) by mouth at bedtime. 90 tablet 1  . carvedilol (COREG) 3.125 MG tablet Take 1 tablet (3.125 mg total) by mouth 2 (two) times daily with a meal. 60 tablet 3  . dapagliflozin propanediol (FARXIGA) 10 MG TABS tablet Take 1 tablet (10 mg total) by mouth daily. 90 tablet 3  . hydroxyurea (HYDREA) 500 MG capsule TAKE 1 CAPSULE BY MOUTH ALTERNATING WITH 2 CAPSULES DAILY(500 MG ONE DAY AND 1000 MG NEXT DAY). 135 capsule 3  . levothyroxine (SYNTHROID) 175 MCG tablet Take 175 mcg by mouth daily before breakfast.    . sacubitril-valsartan (ENTRESTO) 24-26 MG Take 1 tablet by mouth daily.    Marland Kitchen spironolactone (  ALDACTONE) 25 MG tablet TAKE 1/2 TABLET(12.5 MG) BY MOUTH DAILY 45 tablet 3  . warfarin (COUMADIN) 5 MG tablet TAKE AS DIRECTED BY COUMADIN CLINIC 30 tablet 2   No current facility-administered medications for this encounter.    Allergies  Allergen Reactions  . Erythromycin Rash  . Atorvastatin Calcium Other (See Comments)  . Rosuvastatin Calcium     Unknown  . Simvastatin     Unknown  . Zithromax [Azithromycin] Rash      Social History   Socioeconomic History  . Marital status: Divorced    Spouse name: Not on file   . Number of children: Not on file  . Years of education: Not on file  . Highest education level: Not on file  Occupational History  . Not on file  Tobacco Use  . Smoking status: Former Smoker    Packs/day: 1.00    Years: 30.00    Pack years: 30.00    Types: Cigarettes    Start date: 05/26/1979    Quit date: 05/18/2016    Years since quitting: 4.1  . Smokeless tobacco: Never Used  Vaping Use  . Vaping Use: Never used  Substance and Sexual Activity  . Alcohol use: Yes    Alcohol/week: 14.0 standard drinks    Types: 14 Cans of beer per week    Comment: 2 cans a day..  . Drug use: No  . Sexual activity: Not on file  Other Topics Concern  . Not on file  Social History Narrative  . Not on file   Social Determinants of Health   Financial Resource Strain: Not on file  Food Insecurity: Not on file  Transportation Needs: Not on file  Physical Activity: Not on file  Stress: Not on file  Social Connections: Not on file  Intimate Partner Violence: Not on file      Family History  Problem Relation Age of Onset  . Hypertension Father     Vitals:   06/24/20 1017  BP: 108/62  Pulse: 71  SpO2: 97%  Weight: 80 kg (176 lb 6.4 oz)     PHYSICAL EXAM: General:  Well appearing. No resp difficulty HEENT: normal Neck: supple. no JVD. Carotids 2+ bilat; no bruits. No lymphadenopathy or thryomegaly appreciated. Cor: PMI nondisplaced. Mildly irregular. Mechanical s2. crisp Lungs: clear Abdomen: soft, nontender, nondistended. No hepatosplenomegaly. No bruits or masses. Good bowel sounds. Extremities: no cyanosis, clubbing, rash, edema Neuro: alert & orientedx3, cranial nerves grossly intact. moves all 4 extremities w/o difficulty. Affect pleasant   ASSESSMENT & PLAN:  1. Chronic Systolic Heart Failure: -Echo 05/2016 @ [facility], EF 40% -Echo 04/2017 EF 25-30% -Echo 04/2019 EF 20-25%. Diffuse hypokinesis, No LVH. RV normal. Mild AI, trace MR - s/p CRT-D.  - Stable NYHA Class  II-early III. Volume status ok  - Continue Entresto 24-26 (takes once daily, unable to tolerate BID dosing due to dizziness - has tried multiple times). Does not want to try again  - Continue Farxiga 10 mg daily - Continue Coreg 3.125 mg bid.  - Continue spiro 12.5 daily  - Labs today - F/u in 6 months with echo - ICD interrogated in clinic. No VT/AF. PVCs up Personally reviewed  2. Cholelithiasis: recent admission for acute cholecystitis/ sepsis. HIDA scan negative. Improved w/ conservative therapy w/ abx and bowel rest.  - has seen [facility]. Conservative therapy for now - no change for now  3. CAD: - h/o remote PCI in 1994 at [facility] - s/p LHC in 2017  in the setting of Vfib Arrest w/ 3V CAD treated w/ CABG x 3 - no s/s angina - continue ASA, statin and  blocker. - check lipids  4. Mechanical Aortic Valve: - stable on echo 10/21 - on coumadin. INRs followed by [facility] - no bleeding - reminded of SBE prophylaxis  5. H/o VF Arrest and H/o CHB: - s/p BosSci CRT-D device, followed by EP (Dr. Caryl Comes) - on po amiodarone 100mg  daily.  - VT quiet on interrogation   6. AAA:  - CT scan 1/21, now measuring at 4.6 x 4.5 cm (nonruptured) - followed by [facility], Saw Dr. Oneida Alar in 1/22 and AAA stable at 4.7  - BP and HR controlled w/  blocker - on statin therapy   7. Frequent PVCs - on iCD interrogation having more PVCs over past 3 months. Will d/w Dr. Rhunette Croft, MD 06/24/20

## 2020-06-24 NOTE — Patient Instructions (Signed)
Labs done today, your results will be available in MyChart, we will contact you for abnormal readings.   Your physician has requested that you have an echocardiogram. Echocardiography is a painless test that uses sound waves to create images of your heart. It provides your doctor with information about the size and shape of your heart and how well your heart's chambers and valves are working. This procedure takes approximately one hour. There are no restrictions for this procedure.   Please call our office in June 2022 to schedule an appointment with an echocardiogram  If you have any questions or concerns before your next appointment please send [place] a message through [employer] or call our office at [phone].    TO LEAVE A MESSAGE FOR THE NURSE SELECT OPTION 2, PLEASE LEAVE A MESSAGE INCLUDING: . YOUR NAME . DATE OF BIRTH . CALL BACK NUMBER . REASON FOR CALL**this is important as we prioritize the call backs  [place] AS LONG AS YOU CALL BEFORE 4:00 PM  At the [facility], you and your health needs are our priority. As part of our continuing mission to provide you with exceptional heart care, we have created designated Provider Care Teams. These Care Teams include your primary Cardiologist (physician) and Advanced Practice Providers (APPs- Physician Assistants and Nurse Practitioners) who all work together to provide you with the care you need, when you need it.   You may see any of the following providers on your designated Care Team at your next follow up: Marland Kitchen Dr Glori Bickers . Dr Loralie Champagne . Darrick Grinder, NP . Lyda Jester, [place] . Audry Riles, PharmD   Please be sure to bring in all your medications bottles to every appointment.

## 2020-06-25 LAB — T3, FREE: T3, Free: 2.3 pg/mL (ref 2.0–4.4)

## 2020-07-13 ENCOUNTER — Ambulatory Visit (INDEPENDENT_AMBULATORY_CARE_PROVIDER_SITE_OTHER): Payer: Medicare Other | Admitting: *Deleted

## 2020-07-13 DIAGNOSIS — Z5181 Encounter for therapeutic drug level monitoring: Secondary | ICD-10-CM | POA: Diagnosis not present

## 2020-07-13 DIAGNOSIS — Z952 Presence of prosthetic heart valve: Secondary | ICD-10-CM | POA: Diagnosis not present

## 2020-07-13 LAB — POCT INR: INR: 3.2 — AB (ref 2.0–3.0)

## 2020-07-13 MED ORDER — WARFARIN SODIUM 5 MG PO TABS: ORAL_TABLET | ORAL | 4 refills | Status: DC

## 2020-07-13 NOTE — Patient Instructions (Signed)
Amiodarone 100mg  daily  Decrease warfarin to 1/2 tablet daily except 1 tablet on Mondays and Thursdays Continue greens and salads Recheck in 3 weeks  Coumadin Clinic [phone]

## 2020-07-20 ENCOUNTER — Telehealth (HOSPITAL_COMMUNITY): Payer: Self-pay | Admitting: Pharmacy Technician

## 2020-07-20 NOTE — Telephone Encounter (Addendum)
Called [employer] to check the status of the patient's application. Representative stated that they need physical POI.   Called and left the patient a message.

## 2020-07-20 NOTE — Telephone Encounter (Signed)
Spoke with patient regarding POI. He is going to try and get that faxed over. Will fax to [employer] once received.

## 2020-07-27 NOTE — Telephone Encounter (Signed)
Patient emailed pictures of POI to send to [employer] and they will not go through via fax. Called and spoke with patient. He is going to bring the documentation and pick up a box of Farxiga samples. Provided him the phone number to [employer] in order to get a refill  LOT [id number] EXP 7/24

## 2020-07-30 NOTE — Telephone Encounter (Signed)
Sent in POI to [employer] via fax.  Will follow up.

## 2020-08-03 ENCOUNTER — Other Ambulatory Visit (HOSPITAL_COMMUNITY): Payer: Self-pay | Admitting: Internal Medicine

## 2020-08-03 ENCOUNTER — Ambulatory Visit (INDEPENDENT_AMBULATORY_CARE_PROVIDER_SITE_OTHER): Payer: Medicare Other | Admitting: *Deleted

## 2020-08-03 ENCOUNTER — Other Ambulatory Visit: Payer: Self-pay

## 2020-08-03 DIAGNOSIS — Z5181 Encounter for therapeutic drug level monitoring: Secondary | ICD-10-CM | POA: Diagnosis not present

## 2020-08-03 DIAGNOSIS — Z952 Presence of prosthetic heart valve: Secondary | ICD-10-CM | POA: Diagnosis not present

## 2020-08-03 LAB — POCT INR: INR: 3.9 — AB (ref 2.0–3.0)

## 2020-08-03 NOTE — Patient Instructions (Signed)
Amiodarone 100mg  daily   On CBD oil for back pain Hold warfarin tonight then decrease dose to 1/2 tablet daily except 1 tablet on Mondays  Continue greens and salads Recheck in 3 weeks  Coumadin Clinic [phone]

## 2020-08-05 ENCOUNTER — Ambulatory Visit (INDEPENDENT_AMBULATORY_CARE_PROVIDER_SITE_OTHER): Payer: Medicare Other

## 2020-08-05 DIAGNOSIS — I255 Ischemic cardiomyopathy: Secondary | ICD-10-CM | POA: Diagnosis not present

## 2020-08-07 LAB — CUP PACEART REMOTE DEVICE CHECK
Battery Remaining Longevity: 90 mo
Battery Remaining Percentage: 100 %
Brady Statistic RA Percent Paced: 1 %
Brady Statistic RV Percent Paced: 85 %
Date Time Interrogation Session: 20220310004100
HighPow Impedance: 72 Ohm
Implantable Lead Implant Date: 20180105
Implantable Lead Implant Date: 20180105
Implantable Lead Implant Date: 20180105
Implantable Lead Location: 753858
Implantable Lead Location: 753859
Implantable Lead Location: 753860
Implantable Lead Model: 292
Implantable Lead Model: 4672
Implantable Lead Model: 7741
Implantable Lead Serial Number: 428973
Implantable Lead Serial Number: 801088
Implantable Lead Serial Number: 849519
Implantable Pulse Generator Implant Date: 20180105
Lead Channel Impedance Value: 669 Ohm
Lead Channel Impedance Value: 696 Ohm
Lead Channel Impedance Value: 796 Ohm
Lead Channel Pacing Threshold Amplitude: 0.7 V
Lead Channel Pacing Threshold Amplitude: 0.8 V
Lead Channel Pacing Threshold Amplitude: 1 V
Lead Channel Pacing Threshold Pulse Width: 0.4 ms
Lead Channel Pacing Threshold Pulse Width: 0.4 ms
Lead Channel Pacing Threshold Pulse Width: 1 ms
Lead Channel Setting Pacing Amplitude: 2 V
Lead Channel Setting Pacing Amplitude: 2 V
Lead Channel Setting Pacing Amplitude: 2.2 V
Lead Channel Setting Pacing Pulse Width: 0.4 ms
Lead Channel Setting Pacing Pulse Width: 1 ms
Lead Channel Setting Sensing Sensitivity: 0.6 mV
Lead Channel Setting Sensing Sensitivity: 1 mV
Pulse Gen Serial Number: 180139

## 2020-08-09 NOTE — Telephone Encounter (Signed)
Called [employer] to check the status of the patient's application. Representative stated that the POI was received as of 3/8. The application is being sent back to be reviewed since the POI was received.

## 2020-08-13 NOTE — Progress Notes (Signed)
Remote ICD transmission.   

## 2020-08-17 ENCOUNTER — Other Ambulatory Visit (HOSPITAL_COMMUNITY): Payer: Self-pay | Admitting: Internal Medicine

## 2020-08-17 ENCOUNTER — Other Ambulatory Visit (HOSPITAL_COMMUNITY): Payer: Self-pay | Admitting: Cardiology

## 2020-08-17 NOTE — Telephone Encounter (Signed)
[facility] pharmacy sent a request for updated [employer]. Sent information via fax.

## 2020-08-23 ENCOUNTER — Telehealth (HOSPITAL_COMMUNITY): Payer: Self-pay | Admitting: Pharmacy Technician

## 2020-08-23 NOTE — Telephone Encounter (Signed)
Advanced Heart Failure Patient Advocate Encounter   Patient was approved to receive Entresto from [employer]  Patient ID: [id number] Effective dates: 08/13/20 through 05/28/21  Called and left the patient message.   Charlann Boxer, CPhT

## 2020-08-25 NOTE — Progress Notes (Signed)
Remote reviewed. This remote is abnormal for newly identified short runs of fast atrial tach or slow ( perhaps because of amio) afib/flutter  Already on coumadin so would just follow

## 2020-08-26 ENCOUNTER — Ambulatory Visit (INDEPENDENT_AMBULATORY_CARE_PROVIDER_SITE_OTHER): Payer: Medicare Other | Admitting: *Deleted

## 2020-08-26 ENCOUNTER — Other Ambulatory Visit: Payer: Self-pay

## 2020-08-26 DIAGNOSIS — Z952 Presence of prosthetic heart valve: Secondary | ICD-10-CM

## 2020-08-26 DIAGNOSIS — Z5181 Encounter for therapeutic drug level monitoring: Secondary | ICD-10-CM

## 2020-08-26 LAB — POCT INR: INR: 1.8 — AB (ref 2.0–3.0)

## 2020-08-26 NOTE — Patient Instructions (Signed)
Amiodarone 100mg  daily   On CBD oil for back pain Increase warfarin to 1/2 tablet daily except 1 tablet on Mondays and Thursdays Continue greens and salads On new Heart Reversal diet.  Includes Kale Recheck in 3 weeks  Coumadin Clinic [phone]

## 2020-09-15 ENCOUNTER — Telehealth: Payer: Self-pay | Admitting: *Deleted

## 2020-09-15 NOTE — Telephone Encounter (Signed)
Pt was discharged from [facility] yesterday.  S/P cholecystectomy.  Was discharged home on warfarin and lovenox.  Took warfarin 2.5mg  last night. He had questions about the warfarin schedule with Lovenox.  Told pt to take warfarin 5mg  tonight and tomorrow night then 2.5mg  daily along with Lovenox BID until INR appt on Monday 4/25.  Pt verbalized understanding and appreciated call back.

## 2020-09-15 NOTE — Telephone Encounter (Signed)
Patient called stating that he needs to speak with Edrick Oh in regards to his coumdin.

## 2020-09-17 ENCOUNTER — Telehealth: Payer: Self-pay

## 2020-09-17 NOTE — Telephone Encounter (Signed)
Duplicate encounter

## 2020-09-17 NOTE — Telephone Encounter (Addendum)
Unsuccessful telephone encounter to [facility] to assess for medication compliance and s/s of ongoing AF per carlink alert received today. Known PAF. Per EMR, noted patient was recently discharged from hospital s/p cholecystectomy 09/13/20. Unable to leave message as phone continued to ring. Will route to triage and attempt to contact patient later.

## 2020-09-20 ENCOUNTER — Ambulatory Visit (INDEPENDENT_AMBULATORY_CARE_PROVIDER_SITE_OTHER): Payer: Medicare Other | Admitting: *Deleted

## 2020-09-20 DIAGNOSIS — Z952 Presence of prosthetic heart valve: Secondary | ICD-10-CM | POA: Diagnosis not present

## 2020-09-20 DIAGNOSIS — I5022 Chronic systolic (congestive) heart failure: Secondary | ICD-10-CM

## 2020-09-20 DIAGNOSIS — Z5181 Encounter for therapeutic drug level monitoring: Secondary | ICD-10-CM | POA: Diagnosis not present

## 2020-09-20 LAB — POCT INR: INR: 4.6 — AB (ref 2.0–3.0)

## 2020-09-20 NOTE — Patient Instructions (Signed)
Hold warfarin tonight and tomorrow night then decrease dose to 1/2 tablet daily except 1 tablet on Mondays  Continue greens and salads On new Heart Reversal diet.  Includes Kale Recheck in 2 weeks  Coumadin Clinic [phone]

## 2020-09-22 NOTE — Telephone Encounter (Addendum)
Patient reports increased fatigue and SOB with activity since 09/17/20 when AF episode began. Patient attributed fatigue to recovery from hospitaliztion after Cholecystectomy. Remote transmission sent with assitance which showed ongoing AF with VP  Since 09/17/20. + Coumadin, Coreg 3.125 mg BID, amiodorone 200 mg 1/2 tablet daily,  and Entresto 24-26 mg BID. Will refer to AF clinic for evaluation. Patient agreeable to being seen in AF Clinic.

## 2020-09-23 NOTE — Telephone Encounter (Signed)
Patient returned my call and is aware of appt 09/29/20 at 3 pm with AF Clinic

## 2020-09-23 NOTE — Telephone Encounter (Signed)
Called and left message for patient to call back to schedule appt with AFib Clinic. 

## 2020-09-25 NOTE — Telephone Encounter (Signed)
noted 

## 2020-09-27 ENCOUNTER — Telehealth: Payer: Self-pay | Admitting: Internal Medicine

## 2020-09-27 NOTE — Telephone Encounter (Signed)
Called patient.  Discussed pt's concerns.  Informed pt after reviewing recent INR's that he has been too thin instead of too thick.  Told pt to stay on current warfarin dose and keep INR appt for 10/06/20.  He has appt with a fib cline on 09/29/20.  He verbalized understanding.

## 2020-09-27 NOTE — Telephone Encounter (Signed)
New message    Patient states that Dr Caryl Comes told him he was in AFIB and if his coumadin is not correct it could cause him to have a stroke , he is very concerned and would like a call back from [facility]

## 2020-09-29 ENCOUNTER — Other Ambulatory Visit: Payer: Self-pay

## 2020-09-29 ENCOUNTER — Encounter (HOSPITAL_COMMUNITY): Payer: Self-pay | Admitting: Physician Assistant

## 2020-09-29 ENCOUNTER — Ambulatory Visit (HOSPITAL_COMMUNITY)
Admission: RE | Admit: 2020-09-29 | Discharge: 2020-09-29 | Disposition: A | Discharge status: Home / Self Care | Payer: Medicare Other | Source: Ambulatory Visit | Attending: Physician Assistant | Admitting: Physician Assistant

## 2020-09-29 VITALS — BP 122/74 | HR 70 | Ht 69.0 in | Wt 167.0 lb

## 2020-09-29 DIAGNOSIS — Z951 Presence of aortocoronary bypass graft: Secondary | ICD-10-CM | POA: Diagnosis not present

## 2020-09-29 DIAGNOSIS — Z952 Presence of prosthetic heart valve: Secondary | ICD-10-CM | POA: Insufficient documentation

## 2020-09-29 DIAGNOSIS — Z7901 Long term (current) use of anticoagulants: Secondary | ICD-10-CM | POA: Diagnosis not present

## 2020-09-29 DIAGNOSIS — I4819 Other persistent atrial fibrillation: Secondary | ICD-10-CM | POA: Diagnosis present

## 2020-09-29 DIAGNOSIS — E785 Hyperlipidemia, unspecified: Secondary | ICD-10-CM | POA: Insufficient documentation

## 2020-09-29 DIAGNOSIS — I4892 Unspecified atrial flutter: Secondary | ICD-10-CM | POA: Diagnosis not present

## 2020-09-29 DIAGNOSIS — Z87891 Personal history of nicotine dependence: Secondary | ICD-10-CM | POA: Insufficient documentation

## 2020-09-29 DIAGNOSIS — I082 Rheumatic disorders of both aortic and tricuspid valves: Secondary | ICD-10-CM | POA: Insufficient documentation

## 2020-09-29 DIAGNOSIS — Z95810 Presence of automatic (implantable) cardiac defibrillator: Secondary | ICD-10-CM | POA: Insufficient documentation

## 2020-09-29 DIAGNOSIS — I5022 Chronic systolic (congestive) heart failure: Secondary | ICD-10-CM | POA: Diagnosis not present

## 2020-09-29 DIAGNOSIS — I10 Essential (primary) hypertension: Secondary | ICD-10-CM | POA: Diagnosis not present

## 2020-09-29 DIAGNOSIS — D6869 Other thrombophilia: Secondary | ICD-10-CM | POA: Diagnosis not present

## 2020-09-29 DIAGNOSIS — I2510 Atherosclerotic heart disease of native coronary artery without angina pectoris: Secondary | ICD-10-CM | POA: Diagnosis not present

## 2020-09-29 LAB — CBC
HCT: 34.6 % — ABNORMAL LOW (ref 39.0–52.0)
Hemoglobin: 11.6 g/dL — ABNORMAL LOW (ref 13.0–17.0)
MCH: 39.2 pg — ABNORMAL HIGH (ref 26.0–34.0)
MCHC: 33.5 g/dL (ref 30.0–36.0)
MCV: 116.9 fL — ABNORMAL HIGH (ref 80.0–100.0)
Platelets: 686 10*3/uL — ABNORMAL HIGH (ref 150–400)
RBC: 2.96 MIL/uL — ABNORMAL LOW (ref 4.22–5.81)
RDW: 14.8 % (ref 11.5–15.5)
WBC: 5.1 10*3/uL (ref 4.0–10.5)
nRBC: 0 % (ref 0.0–0.2)

## 2020-09-29 LAB — BASIC METABOLIC PANEL
Anion gap: 10 (ref 5–15)
BUN: 15 mg/dL (ref 8–23)
CO2: 22 mmol/L (ref 22–32)
Calcium: 9.1 mg/dL (ref 8.9–10.3)
Chloride: 104 mmol/L (ref 98–111)
Creatinine, Ser: 1.06 mg/dL (ref 0.61–1.24)
GFR, Estimated: >60 mL/min (ref >60)
Glucose, Bld: 91 mg/dL (ref 70–99)
Potassium: 3.9 mmol/L (ref 3.5–5.1)
Sodium: 136 mmol/L (ref 135–145)

## 2020-09-29 NOTE — Patient Instructions (Signed)
Cardioversion scheduled for Tuesday, May 10th  - Have INR checked at coumadin clinic prior to arrival  - Arrive at the [facility] and go to admitting at [facility] not eat or drink anything after midnight the night prior to your procedure.  - Take all your morning medication (except diabetic medications) with a sip of water prior to arrival.  - You will not be able to drive home after your procedure.  - Do NOT miss any doses of your blood thinner - if you should miss a dose please notify our office immediately.  - If you feel as if you go back into normal rhythm prior to scheduled cardioversion, please notify our office immediately. If your procedure is canceled in the cardioversion suite you will be charged a cancellation fee.

## 2020-09-29 NOTE — Progress Notes (Signed)
Primary Care Physician: Michell Heinrich, DO Primary Cardiologist: Dr Haroldine Laws  Primary Electrophysiologist: Dr Caryl Comes Referring Physician: Device clinic/Dr Mcclain [facility] is a 70 y.o. male with a history of mech AVR, VF arrest s/p ICD, CAD s/p CABG, chronic systolic CHF, AAA, and atrial fibrillation who presents for follow up in the [facility]. Has h/o congenital AoV abnormality with AI. In 1994 had MI and underwent PCI and mechanical AVR at [facility]. Has h/o polysubstance abuse with cocaine, ETOH and tobacco abuse. No longer doing cocaine or smoking. He had VF arrest on 05/18/16. Found to have 3v CAD and underwent CABG with re-do mechanicalAVR. Post-operatively had AF (treated with amio) and CHB requiring implantation of a BosSci CRT-D device. Patient is on warfarin for a CHADS2VASC score of 3.   On follow up today, the device clinic received an alert for an ongoing afib episode starting 09/17/20. He had a cholecystectomy on 09/14/20. Over the last couple of weeks, he has felt much more fatigued than usual. He denies any bleed issues on anticoagulation.   Today, he denies symptoms of palpitations, chest pain, shortness of breath, orthopnea, PND, lower extremity edema, dizziness, presyncope, syncope, snoring, daytime somnolence, bleeding, or neurologic sequela. The patient is tolerating medications without difficulties and is otherwise without complaint today.    Atrial Fibrillation Risk Factors:  he does not have symptoms or diagnosis of sleep apnea. he does not have a history of rheumatic fever. he does have a history of alcohol use.  he has a BMI of Body mass index is 24.66 kg/m.Marland Kitchen Filed Weights   09/29/20 1501  Weight: 75.8 kg    Family History  Problem Relation Age of Onset  . Hypertension Father      Atrial Fibrillation Management history:  Previous antiarrhythmic drugs: amiodarone  Previous cardioversions: none Previous ablations:  none CHADS2VASC score: 3 Anticoagulation history: warfarin    Past Medical History:  Diagnosis Date  . Atherosclerotic heart disease of native coronary artery without angina pectoris   . Chronic anticoagulation 02/18/2018  . Chronic systolic heart failure ([place]) 02/02/2017  . Essential thrombocythemia ([place]) 02/15/2017  . Hyperlipidemia   . Hypertension   . Ischemic cardiomyopathy   . Leg pain, central, left 04/17/2017   Hx sciatica - pain similar; no red flag signs on hx or exam   . Presence of other heart-valve replacement   . Ventricular tachycardia [facility])    Past Surgical History:  Procedure Laterality Date  . BIV ICD INSERTION CRT-D    . CORONARY ARTERY BYPASS GRAFT  2017  . MECHANICAL AORTIC VALVE REPLACEMENT      Current Outpatient Medications  Medication Sig Dispense Refill  . acetaminophen (TYLENOL) 650 MG CR tablet Take 650 mg by mouth every 8 (eight) hours as needed for pain.    Marland Kitchen amiodarone (PACERONE) 200 MG tablet Take 0.5 tablets (100 mg total) by mouth daily. 45 tablet 3  . aspirin 81 MG tablet Take 1 tablet by mouth.    Marland Kitchen atorvastatin (LIPITOR) 40 MG tablet TAKE 1 TABLET(40 MG) BY MOUTH AT BEDTIME 90 tablet 1  . benzonatate (TESSALON) 100 MG capsule Take 100 mg by mouth 3 (three) times daily.    . carvedilol (COREG) 3.125 MG tablet TAKE 1 TABLET(3.125 MG) BY MOUTH TWICE DAILY WITH A MEAL 180 tablet 3  . dapagliflozin propanediol (FARXIGA) 10 MG TABS tablet Take 1 tablet (10 mg total) by mouth daily. 90 tablet 3  . hydroxyurea (HYDREA) 500  MG capsule TAKE 1 CAPSULE BY MOUTH ALTERNATING WITH 2 CAPSULES DAILY(500 MG ONE DAY AND 1000 MG NEXT DAY). 135 capsule 3  . levothyroxine (SYNTHROID) 75 MCG tablet Take 75 mcg by mouth daily.    . sacubitril-valsartan (ENTRESTO) 24-26 MG Take 1 tablet by mouth daily.    . sildenafil (REVATIO) 20 MG tablet Take 20 mg by mouth 2 (two) times daily.    Marland Kitchen spironolactone (ALDACTONE) 25 MG tablet TAKE 1/2 TABLET(12.5 MG) BY MOUTH DAILY 45  tablet 3  . warfarin (COUMADIN) 5 MG tablet Take 1/2 tablet daily except 1 tablet on Mondays and Thursdays or as directed 30 tablet 4   No current facility-administered medications for this encounter.    Allergies  Allergen Reactions  . Erythromycin Rash  . Atorvastatin Calcium Other (See Comments)  . Rosuvastatin Calcium     Unknown  . Simvastatin     Unknown  . Zithromax [Azithromycin] Rash    Social History   Socioeconomic History  . Marital status: Divorced    Spouse name: Not on file  . Number of children: Not on file  . Years of education: Not on file  . Highest education level: Not on file  Occupational History  . Not on file  Tobacco Use  . Smoking status: Former Smoker    Packs/day: 1.00    Years: 30.00    Pack years: 30.00    Types: Cigarettes    Start date: 05/26/1979    Quit date: 05/18/2016    Years since quitting: 4.3  . Smokeless tobacco: Never Used  Vaping Use  . Vaping Use: Never used  Substance and Sexual Activity  . Alcohol use: Yes    Alcohol/week: 10.0 standard drinks    Types: 10 Cans of beer per week    Comment: 1 can a day..  . Drug use: No  . Sexual activity: Not on file  Other Topics Concern  . Not on file  Social History Narrative  . Not on file   Social Determinants of Health   Financial Resource Strain: Not on file  Food Insecurity: Not on file  Transportation Needs: Not on file  Physical Activity: Not on file  Stress: Not on file  Social Connections: Not on file  Intimate Partner Violence: Not on file     ROS- All systems are reviewed and negative except as per the HPI above.  Physical Exam: Vitals:   09/29/20 1501  BP: 122/74  Pulse: 70  Weight: 75.8 kg  Height: 5\' 9"  (1.753 m)    GEN- The patient is a well appearing male, alert and oriented x 3 today.  Head- normocephalic, atraumatic Eyes-  Sclera clear, conjunctiva pink Ears- hearing intact Oropharynx- clear Neck- supple  Lungs- Clear to ausculation  bilaterally, normal work of breathing Heart- Regular rate and rhythm, no murmurs, rubs or gallops  GI- soft, NT, ND, + BS Extremities- no clubbing, cyanosis, or edema MS- no significant deformity or atrophy Skin- no rash or lesion Psych- euthymic mood, full affect Neuro- strength and sensation are intact  Wt Readings from Last 3 Encounters:  09/29/20 75.8 kg  06/24/20 80 kg  06/09/20 76.7 kg    EKG today demonstrates  V pacing with underlying afib Vent. rate 70 BPM PR interval * ms QRS duration 148 ms QT/QTcB 496/535 ms  Echo 03/20/19 demonstrated  1. Left ventricular ejection fraction, by visual estimation, is 20 to  25%. The left ventricle has severely decreased function. Severely  increased left  ventricular size. There is no left ventricular hypertrophy.  2. Left ventricular diastolic Doppler parameters are consistent with  restrictive filling pattern of LV diastolic filling.  3. Global diffuse hypokinesis. Chambers measure slightly more dilated  than prior.  4. Global right ventricle has normal systolic function.The right  ventricular size is normal. No increase in right ventricular wall  thickness.  5. Left atrial size was moderately dilated.  6. Right atrial size was moderately dilated.  7. The mitral valve is normal in structure. Trace mitral valve  regurgitation.  8. The tricuspid valve is normal in structure. Tricuspid valve  regurgitation is mild.  9. Aortic valve regurgitation is mild by color flow Doppler.  10. Bileaflet mechanical prosthesis in the aortic valve position.  11. The pulmonic valve was grossly normal. Pulmonic valve regurgitation is  mild by color flow Doppler.  12. The aortic root was not well visualized.  13. Normal pulmonary artery systolic pressure.  14. The tricuspid regurgitant velocity is 2.40 m/s, and with an assumed  right atrial pressure of 3 mmHg, the estimated right ventricular systolic  pressure is normal at 26.1 mmHg.   15. The inferior vena cava is normal in size with greater than 50%  respiratory variability, suggesting right atrial pressure of 3 mmHg.   Epic records are reviewed at length today  CHA2DS2-VASc Score = 3  The patient's score is based upon: CHF History: Yes HTN History: No Diabetes History: No Stroke History: No Vascular Disease History: Yes Age Score: 1 Gender Score: 0      ASSESSMENT AND PLAN: 1. Persistent Atrial Fibrillation  The patient's CHA2DS2-VASc score is 3, indicating a 3.2% annual risk of stroke.   Patient in symptomatic rate controlled afib. Suspect 2/2 recent surgery. We discussed therapeutic options today. Will plan for DCCV. given his severe fatigue, will opt for TEE instead of waiting for 4 therapeutic INRs. Check bmet/cbc Continue amiodarone 100 mg daily Continue Coreg 3.125 mg BID Continue warfarin   2. Secondary Hypercoagulable State (ULA45:  D68.69){Click to add to Prob List or Visit Dx  :[id number]} The patient is at significant risk for stroke/thromboembolism based upon his CHA2DS2-VASc Score of 3.  Continue Warfarin (Coumadin).   3. Chronic systolic CHF No signs or symptoms of fluid overload. Followed in the [facility].  4. CAD S/p CABG 2017 No anginal symptoms.  5. AVR S/p mech AVR. On warfarin.  6. VF S/p CRT-D, followed by Dr Caryl Comes and the device clinic.   Follow up in the AF clinic post DCCV.    [facility] [address] [place], [place] [address] [phone] 09/29/2020 3:09 PM

## 2020-09-29 NOTE — H&P (View-Only) (Signed)
Primary Care Physician: Michell Heinrich, DO Primary Cardiologist: Dr Haroldine Laws  Primary Electrophysiologist: Dr Caryl Comes Referring Physician: Device clinic/Dr Pier Bosher [facility] is a 70 y.o. male with a history of mech AVR, VF arrest s/p ICD, CAD s/p CABG, chronic systolic CHF, AAA, and atrial fibrillation who presents for follow up in the [facility]. Has h/o congenital AoV abnormality with AI. In 1994 had MI and underwent PCI and mechanical AVR at [facility]. Has h/o polysubstance abuse with cocaine, ETOH and tobacco abuse. No longer doing cocaine or smoking. He had VF arrest on 05/18/16. Found to have 3v CAD and underwent CABG with re-do mechanicalAVR. Post-operatively had AF (treated with amio) and CHB requiring implantation of a BosSci CRT-D device. Patient is on warfarin for a CHADS2VASC score of 3.   On follow up today, the device clinic received an alert for an ongoing afib episode starting 09/17/20. He had a cholecystectomy on 09/14/20. Over the last couple of weeks, he has felt much more fatigued than usual. He denies any bleed issues on anticoagulation.   Today, he denies symptoms of palpitations, chest pain, shortness of breath, orthopnea, PND, lower extremity edema, dizziness, presyncope, syncope, snoring, daytime somnolence, bleeding, or neurologic sequela. The patient is tolerating medications without difficulties and is otherwise without complaint today.    Atrial Fibrillation Risk Factors:  he does not have symptoms or diagnosis of sleep apnea. he does not have a history of rheumatic fever. he does have a history of alcohol use.  he has a BMI of Body mass index is 24.66 kg/m.Marland Kitchen Filed Weights   09/29/20 1501  Weight: 75.8 kg    Family History  Problem Relation Age of Onset  . Hypertension Father      Atrial Fibrillation Management history:  Previous antiarrhythmic drugs: amiodarone  Previous cardioversions: none Previous ablations:  none CHADS2VASC score: 3 Anticoagulation history: warfarin    Past Medical History:  Diagnosis Date  . Atherosclerotic heart disease of native coronary artery without angina pectoris   . Chronic anticoagulation 02/18/2018  . Chronic systolic heart failure ([place]) 02/02/2017  . Essential thrombocythemia ([place]) 02/15/2017  . Hyperlipidemia   . Hypertension   . Ischemic cardiomyopathy   . Leg pain, central, left 04/17/2017   Hx sciatica - pain similar; no red flag signs on hx or exam   . Presence of other heart-valve replacement   . Ventricular tachycardia [facility])    Past Surgical History:  Procedure Laterality Date  . BIV ICD INSERTION CRT-D    . CORONARY ARTERY BYPASS GRAFT  2017  . MECHANICAL AORTIC VALVE REPLACEMENT      Current Outpatient Medications  Medication Sig Dispense Refill  . acetaminophen (TYLENOL) 650 MG CR tablet Take 650 mg by mouth every 8 (eight) hours as needed for pain.    Marland Kitchen amiodarone (PACERONE) 200 MG tablet Take 0.5 tablets (100 mg total) by mouth daily. 45 tablet 3  . aspirin 81 MG tablet Take 1 tablet by mouth.    Marland Kitchen atorvastatin (LIPITOR) 40 MG tablet TAKE 1 TABLET(40 MG) BY MOUTH AT BEDTIME 90 tablet 1  . benzonatate (TESSALON) 100 MG capsule Take 100 mg by mouth 3 (three) times daily.    . carvedilol (COREG) 3.125 MG tablet TAKE 1 TABLET(3.125 MG) BY MOUTH TWICE DAILY WITH A MEAL 180 tablet 3  . dapagliflozin propanediol (FARXIGA) 10 MG TABS tablet Take 1 tablet (10 mg total) by mouth daily. 90 tablet 3  . hydroxyurea (HYDREA) 500  MG capsule TAKE 1 CAPSULE BY MOUTH ALTERNATING WITH 2 CAPSULES DAILY(500 MG ONE DAY AND 1000 MG NEXT DAY). 135 capsule 3  . levothyroxine (SYNTHROID) 75 MCG tablet Take 75 mcg by mouth daily.    . sacubitril-valsartan (ENTRESTO) 24-26 MG Take 1 tablet by mouth daily.    . sildenafil (REVATIO) 20 MG tablet Take 20 mg by mouth 2 (two) times daily.    Marland Kitchen spironolactone (ALDACTONE) 25 MG tablet TAKE 1/2 TABLET(12.5 MG) BY MOUTH DAILY 45  tablet 3  . warfarin (COUMADIN) 5 MG tablet Take 1/2 tablet daily except 1 tablet on Mondays and Thursdays or as directed 30 tablet 4   No current facility-administered medications for this encounter.    Allergies  Allergen Reactions  . Erythromycin Rash  . Atorvastatin Calcium Other (See Comments)  . Rosuvastatin Calcium     Unknown  . Simvastatin     Unknown  . Zithromax [Azithromycin] Rash    Social History   Socioeconomic History  . Marital status: Divorced    Spouse name: Not on file  . Number of children: Not on file  . Years of education: Not on file  . Highest education level: Not on file  Occupational History  . Not on file  Tobacco Use  . Smoking status: Former Smoker    Packs/day: 1.00    Years: 30.00    Pack years: 30.00    Types: Cigarettes    Start date: 05/26/1979    Quit date: 05/18/2016    Years since quitting: 4.3  . Smokeless tobacco: Never Used  Vaping Use  . Vaping Use: Never used  Substance and Sexual Activity  . Alcohol use: Yes    Alcohol/week: 10.0 standard drinks    Types: 10 Cans of beer per week    Comment: 1 can a day..  . Drug use: No  . Sexual activity: Not on file  Other Topics Concern  . Not on file  Social History Narrative  . Not on file   Social Determinants of Health   Financial Resource Strain: Not on file  Food Insecurity: Not on file  Transportation Needs: Not on file  Physical Activity: Not on file  Stress: Not on file  Social Connections: Not on file  Intimate Partner Violence: Not on file     ROS- All systems are reviewed and negative except as per the HPI above.  Physical Exam: Vitals:   09/29/20 1501  BP: 122/74  Pulse: 70  Weight: 75.8 kg  Height: 5\' 9"  (1.753 m)    GEN- The patient is a well appearing male, alert and oriented x 3 today.  Head- normocephalic, atraumatic Eyes-  Sclera clear, conjunctiva pink Ears- hearing intact Oropharynx- clear Neck- supple  Lungs- Clear to ausculation  bilaterally, normal work of breathing Heart- Regular rate and rhythm, no murmurs, rubs or gallops  GI- soft, NT, ND, + BS Extremities- no clubbing, cyanosis, or edema MS- no significant deformity or atrophy Skin- no rash or lesion Psych- euthymic mood, full affect Neuro- strength and sensation are intact  Wt Readings from Last 3 Encounters:  09/29/20 75.8 kg  06/24/20 80 kg  06/09/20 76.7 kg    EKG today demonstrates  V pacing with underlying afib Vent. rate 70 BPM PR interval * ms QRS duration 148 ms QT/QTcB 496/535 ms  Echo 03/20/19 demonstrated  1. Left ventricular ejection fraction, by visual estimation, is 20 to  25%. The left ventricle has severely decreased function. Severely  increased left  ventricular size. There is no left ventricular hypertrophy.  2. Left ventricular diastolic Doppler parameters are consistent with  restrictive filling pattern of LV diastolic filling.  3. Global diffuse hypokinesis. Chambers measure slightly more dilated  than prior.  4. Global right ventricle has normal systolic function.The right  ventricular size is normal. No increase in right ventricular wall  thickness.  5. Left atrial size was moderately dilated.  6. Right atrial size was moderately dilated.  7. The mitral valve is normal in structure. Trace mitral valve  regurgitation.  8. The tricuspid valve is normal in structure. Tricuspid valve  regurgitation is mild.  9. Aortic valve regurgitation is mild by color flow Doppler.  10. Bileaflet mechanical prosthesis in the aortic valve position.  11. The pulmonic valve was grossly normal. Pulmonic valve regurgitation is  mild by color flow Doppler.  12. The aortic root was not well visualized.  13. Normal pulmonary artery systolic pressure.  14. The tricuspid regurgitant velocity is 2.40 m/s, and with an assumed  right atrial pressure of 3 mmHg, the estimated right ventricular systolic  pressure is normal at 26.1 mmHg.   15. The inferior vena cava is normal in size with greater than 50%  respiratory variability, suggesting right atrial pressure of 3 mmHg.   Epic records are reviewed at length today  CHA2DS2-VASc Score = 3  The patient's score is based upon: CHF History: Yes HTN History: No Diabetes History: No Stroke History: No Vascular Disease History: Yes Age Score: 1 Gender Score: 0      ASSESSMENT AND PLAN: 1. Persistent Atrial Fibrillation  The patient's CHA2DS2-VASc score is 3, indicating a 3.2% annual risk of stroke.   Patient in symptomatic rate controlled afib. Suspect 2/2 recent surgery. We discussed therapeutic options today. Will plan for DCCV. given his severe fatigue, will opt for TEE instead of waiting for 4 therapeutic INRs. Check bmet/cbc Continue amiodarone 100 mg daily Continue Coreg 3.125 mg BID Continue warfarin   2. Secondary Hypercoagulable State (ICD10:  D68.69) The patient is at significant risk for stroke/thromboembolism based upon his CHA2DS2-VASc Score of 3.  Continue Warfarin (Coumadin).   3. Chronic systolic CHF No signs or symptoms of fluid overload. Followed in the [facility].  4. CAD S/p CABG 2017 No anginal symptoms.  5. AVR S/p mech AVR. On warfarin.  6. VF S/p CRT-D, followed by Dr Caryl Comes and the device clinic.   Follow up in the AF clinic post DCCV.    [facility] [address] [place],  [address] [phone] 09/29/2020 3:09 PM

## 2020-10-01 ENCOUNTER — Telehealth (HOSPITAL_COMMUNITY): Payer: Self-pay | Admitting: Pharmacy Technician

## 2020-10-01 ENCOUNTER — Other Ambulatory Visit (HOSPITAL_COMMUNITY)
Admission: RE | Admit: 2020-10-01 | Discharge: 2020-10-01 | Disposition: A | Discharge status: Home / Self Care | Payer: Medicare Other | Source: Ambulatory Visit | Attending: Cardiovascular Disease | Admitting: Cardiovascular Disease

## 2020-10-01 DIAGNOSIS — Z01812 Encounter for preprocedural laboratory examination: Secondary | ICD-10-CM | POA: Insufficient documentation

## 2020-10-01 DIAGNOSIS — Z20822 Contact with and (suspected) exposure to covid-19: Secondary | ICD-10-CM | POA: Insufficient documentation

## 2020-10-01 NOTE — Telephone Encounter (Signed)
I received a message that the patient had not received Farxiga from [employer]. Called and spoke with a representative. She stated that for some reason, even though the automated re-enrollment was captured, it did not cross over that the patient was active.  The representative fixed the patient's enrollment and he is now enrolled until 09/30/2021. The shipment could take 5 business day for the patient to receive. Called and left the patient a message.   Charlann Boxer, CPhT

## 2020-10-02 LAB — SARS CORONAVIRUS 2 (TAT 6-24 HRS): SARS Coronavirus 2: NEGATIVE

## 2020-10-04 ENCOUNTER — Other Ambulatory Visit (HOSPITAL_COMMUNITY): Payer: Self-pay | Admitting: *Deleted

## 2020-10-05 ENCOUNTER — Ambulatory Visit (INDEPENDENT_AMBULATORY_CARE_PROVIDER_SITE_OTHER): Payer: Medicare Other | Admitting: *Deleted

## 2020-10-05 ENCOUNTER — Telehealth (HOSPITAL_COMMUNITY): Payer: Self-pay | Admitting: *Deleted

## 2020-10-05 ENCOUNTER — Ambulatory Visit (HOSPITAL_COMMUNITY): Admission: RE | Admit: 2020-10-05 | Payer: Medicare Other | Source: Home / Self Care | Admitting: Cardiovascular Disease

## 2020-10-05 ENCOUNTER — Ambulatory Visit (HOSPITAL_COMMUNITY): Admission: RE | Admit: 2020-10-05 | Payer: Medicare Other | Source: Ambulatory Visit

## 2020-10-05 ENCOUNTER — Encounter (HOSPITAL_COMMUNITY): Admission: RE | Payer: Self-pay | Source: Home / Self Care

## 2020-10-05 DIAGNOSIS — Z5181 Encounter for therapeutic drug level monitoring: Secondary | ICD-10-CM

## 2020-10-05 DIAGNOSIS — Z952 Presence of prosthetic heart valve: Secondary | ICD-10-CM | POA: Diagnosis not present

## 2020-10-05 LAB — POCT INR: INR: 1.6 — AB (ref 2.0–3.0)

## 2020-10-05 SURGERY — ECHOCARDIOGRAM, TRANSESOPHAGEAL
Anesthesia: General

## 2020-10-05 NOTE — Patient Instructions (Signed)
Increase warfarin to 1/2 tablet daily except 1 tablet on Tuesdays, Thursdays and Saturdays  Continue greens and salads On new Heart Reversal diet.  Recheck in 1 week Coumadin Clinic [phone]

## 2020-10-05 NOTE — Telephone Encounter (Signed)
Patient INR 1.6 this morning. Per Adline Peals PA will need to cancel and reschedule once therapeutic.  Edrick Oh RN will notify when INR therapeutic.

## 2020-10-12 ENCOUNTER — Ambulatory Visit (INDEPENDENT_AMBULATORY_CARE_PROVIDER_SITE_OTHER): Payer: Medicare Other | Admitting: *Deleted

## 2020-10-12 ENCOUNTER — Ambulatory Visit (HOSPITAL_COMMUNITY): Payer: Medicare Other | Admitting: Physician Assistant

## 2020-10-12 ENCOUNTER — Other Ambulatory Visit (HOSPITAL_COMMUNITY): Payer: Self-pay | Admitting: *Deleted

## 2020-10-12 DIAGNOSIS — Z5181 Encounter for therapeutic drug level monitoring: Secondary | ICD-10-CM | POA: Diagnosis not present

## 2020-10-12 DIAGNOSIS — I5022 Chronic systolic (congestive) heart failure: Secondary | ICD-10-CM | POA: Diagnosis not present

## 2020-10-12 DIAGNOSIS — Z952 Presence of prosthetic heart valve: Secondary | ICD-10-CM | POA: Diagnosis not present

## 2020-10-12 LAB — POCT INR: INR: 2.1 (ref 2.0–3.0)

## 2020-10-12 NOTE — Telephone Encounter (Signed)
-----   Message from Malen Gauze, RN sent at 10/12/2020  9:21 AM EDT ----- Hey girl! INR is 2.1 today.  He is in NSR at the present time. Lattie Haw

## 2020-10-12 NOTE — Telephone Encounter (Signed)
Left message instructing pt to send transmission to confirm rhythm. Will await transmission.

## 2020-10-12 NOTE — Telephone Encounter (Signed)
      Presenting rhythm from 10/12/20.

## 2020-10-12 NOTE — Telephone Encounter (Signed)
Transmission received 10/12/2020

## 2020-10-12 NOTE — Telephone Encounter (Signed)
-----   Message from Juluis Mire, RN sent at 10/12/2020  9:36 AM EDT ----- Will have pt send transmission to confirm rhythm. Thanks! Stacy  ----- Message ----- From: Malen Gauze, RN Sent: 10/12/2020   9:21 AM EDT To: Juluis Mire, RN  Hey girl! INR is 2.1 today.  He is in NSR at the present time. Lattie Haw

## 2020-10-12 NOTE — Patient Instructions (Addendum)
Pending TEE/DCCV  on 10/21/20  Ist therapeutic INR today Increase warfarin to 1 tablet daily except 1/2 tablet on M,W,F Hold off on greens and salads Recheck on 5/26 before procedure Coumadin Clinic [phone] ECG done today.  Faxed to Afib clinic

## 2020-10-12 NOTE — Telephone Encounter (Signed)
Appointment for INR check moved to 10/21/20 prior to procedure.  Pt notified and verbalized understanding.

## 2020-10-12 NOTE — Telephone Encounter (Signed)
Transmission confirms continued AF. TEE/DCCV set up per Adline Peals PA on  5/26 Instructed  NPO after MN meds with a sip of water. WIll need driver present. INR prior to arrival. Pt verbalized understanding.

## 2020-10-20 ENCOUNTER — Other Ambulatory Visit (HOSPITAL_COMMUNITY): Payer: Self-pay | Admitting: *Deleted

## 2020-10-20 DIAGNOSIS — I4892 Unspecified atrial flutter: Secondary | ICD-10-CM

## 2020-10-21 ENCOUNTER — Ambulatory Visit (HOSPITAL_COMMUNITY): Payer: Medicare Other | Admitting: Certified Registered Nurse Anesthetist

## 2020-10-21 ENCOUNTER — Ambulatory Visit (HOSPITAL_COMMUNITY)
Admission: RE | Admit: 2020-10-21 | Discharge: 2020-10-21 | Disposition: A | Discharge status: Home / Self Care | Payer: Medicare Other | Attending: Cardiology | Admitting: Cardiology

## 2020-10-21 ENCOUNTER — Encounter (HOSPITAL_COMMUNITY)
Admission: RE | Disposition: A | Discharge status: Home / Self Care | Payer: Self-pay | Source: Home / Self Care | Attending: Cardiology

## 2020-10-21 ENCOUNTER — Ambulatory Visit (HOSPITAL_COMMUNITY)
Admission: RE | Admit: 2020-10-21 | Discharge: 2020-10-21 | Disposition: A | Discharge status: Home / Self Care | Payer: Medicare Other | Source: Ambulatory Visit | Attending: Physician Assistant | Admitting: Physician Assistant

## 2020-10-21 ENCOUNTER — Other Ambulatory Visit: Payer: Self-pay

## 2020-10-21 ENCOUNTER — Ambulatory Visit (INDEPENDENT_AMBULATORY_CARE_PROVIDER_SITE_OTHER): Payer: Medicare Other | Admitting: *Deleted

## 2020-10-21 ENCOUNTER — Ambulatory Visit (HOSPITAL_BASED_OUTPATIENT_CLINIC_OR_DEPARTMENT_OTHER): Payer: Medicare Other

## 2020-10-21 ENCOUNTER — Telehealth (HOSPITAL_COMMUNITY): Payer: Self-pay | Admitting: Pharmacy Technician

## 2020-10-21 ENCOUNTER — Encounter (HOSPITAL_COMMUNITY): Payer: Self-pay | Admitting: Cardiology

## 2020-10-21 ENCOUNTER — Other Ambulatory Visit (HOSPITAL_COMMUNITY): Payer: Self-pay | Admitting: *Deleted

## 2020-10-21 DIAGNOSIS — I2510 Atherosclerotic heart disease of native coronary artery without angina pectoris: Secondary | ICD-10-CM | POA: Diagnosis not present

## 2020-10-21 DIAGNOSIS — Z951 Presence of aortocoronary bypass graft: Secondary | ICD-10-CM | POA: Diagnosis not present

## 2020-10-21 DIAGNOSIS — Z952 Presence of prosthetic heart valve: Secondary | ICD-10-CM | POA: Diagnosis not present

## 2020-10-21 DIAGNOSIS — Z79899 Other long term (current) drug therapy: Secondary | ICD-10-CM | POA: Diagnosis not present

## 2020-10-21 DIAGNOSIS — I4819 Other persistent atrial fibrillation: Secondary | ICD-10-CM

## 2020-10-21 DIAGNOSIS — I700 Atherosclerosis of aorta: Secondary | ICD-10-CM | POA: Diagnosis not present

## 2020-10-21 DIAGNOSIS — Z7982 Long term (current) use of aspirin: Secondary | ICD-10-CM | POA: Insufficient documentation

## 2020-10-21 DIAGNOSIS — I4892 Unspecified atrial flutter: Secondary | ICD-10-CM

## 2020-10-21 DIAGNOSIS — Z79890 Hormone replacement therapy (postmenopausal): Secondary | ICD-10-CM | POA: Diagnosis not present

## 2020-10-21 DIAGNOSIS — Z881 Allergy status to other antibiotic agents status: Secondary | ICD-10-CM | POA: Insufficient documentation

## 2020-10-21 DIAGNOSIS — D6869 Other thrombophilia: Secondary | ICD-10-CM | POA: Diagnosis not present

## 2020-10-21 DIAGNOSIS — Z7901 Long term (current) use of anticoagulants: Secondary | ICD-10-CM | POA: Diagnosis not present

## 2020-10-21 DIAGNOSIS — I5022 Chronic systolic (congestive) heart failure: Secondary | ICD-10-CM | POA: Insufficient documentation

## 2020-10-21 DIAGNOSIS — Z888 Allergy status to other drugs, medicaments and biological substances status: Secondary | ICD-10-CM | POA: Diagnosis not present

## 2020-10-21 DIAGNOSIS — Z87891 Personal history of nicotine dependence: Secondary | ICD-10-CM | POA: Diagnosis not present

## 2020-10-21 DIAGNOSIS — Z5181 Encounter for therapeutic drug level monitoring: Secondary | ICD-10-CM

## 2020-10-21 DIAGNOSIS — I340 Nonrheumatic mitral (valve) insufficiency: Secondary | ICD-10-CM | POA: Diagnosis not present

## 2020-10-21 DIAGNOSIS — I083 Combined rheumatic disorders of mitral, aortic and tricuspid valves: Secondary | ICD-10-CM | POA: Insufficient documentation

## 2020-10-21 DIAGNOSIS — I361 Nonrheumatic tricuspid (valve) insufficiency: Secondary | ICD-10-CM | POA: Diagnosis not present

## 2020-10-21 HISTORY — PX: TEE WITHOUT CARDIOVERSION: SHX5443

## 2020-10-21 HISTORY — PX: CARDIOVERSION: SHX1299

## 2020-10-21 LAB — BASIC METABOLIC PANEL
Anion gap: 12 (ref 5–15)
BUN: 18 mg/dL (ref 8–23)
CO2: 23 mmol/L (ref 22–32)
Calcium: 9.1 mg/dL (ref 8.9–10.3)
Chloride: 102 mmol/L (ref 98–111)
Creatinine, Ser: 1.14 mg/dL (ref 0.61–1.24)
GFR, Estimated: >60 mL/min (ref >60)
Glucose, Bld: 94 mg/dL (ref 70–99)
Potassium: 4.5 mmol/L (ref 3.5–5.1)
Sodium: 137 mmol/L (ref 135–145)

## 2020-10-21 LAB — CBC
HCT: 43.9 % (ref 39.0–52.0)
Hemoglobin: 14.7 g/dL (ref 13.0–17.0)
MCH: 38.8 pg — ABNORMAL HIGH (ref 26.0–34.0)
MCHC: 33.5 g/dL (ref 30.0–36.0)
MCV: 115.8 fL — ABNORMAL HIGH (ref 80.0–100.0)
Platelets: 560 10*3/uL — ABNORMAL HIGH (ref 150–400)
RBC: 3.79 MIL/uL — ABNORMAL LOW (ref 4.22–5.81)
RDW: 14.6 % (ref 11.5–15.5)
WBC: 6.7 10*3/uL (ref 4.0–10.5)
nRBC: 0 % (ref 0.0–0.2)

## 2020-10-21 LAB — ECHO TEE
AV Mean grad: 2 mmHg
AV Peak grad: 4.8 mmHg
Ao pk vel: 1.1 m/s

## 2020-10-21 LAB — POCT INR: INR: 3.4 — AB (ref 2.0–3.0)

## 2020-10-21 SURGERY — ECHOCARDIOGRAM, TRANSESOPHAGEAL
Anesthesia: Monitor Anesthesia Care

## 2020-10-21 MED ORDER — GLYCOPYRROLATE 0.2 MG/ML IJ SOLN
INTRAMUSCULAR | Status: DC | PRN
  Administered 2020-10-21 (×2): .1 mg via INTRAVENOUS

## 2020-10-21 MED ORDER — SODIUM CHLORIDE 0.9 % IV SOLN: INTRAVENOUS | Status: DC | PRN

## 2020-10-21 MED ORDER — BUTAMBEN-TETRACAINE-BENZOCAINE 2-2-14 % EX AERO
INHALATION_SPRAY | CUTANEOUS | Status: DC | PRN
  Administered 2020-10-21: 2 via TOPICAL

## 2020-10-21 MED ORDER — LACTATED RINGERS IV SOLN: INTRAVENOUS | Status: DC

## 2020-10-21 MED ORDER — SODIUM CHLORIDE 0.9 % IV SOLN: INTRAVENOUS | Status: DC

## 2020-10-21 MED ORDER — PHENYLEPHRINE HCL (PRESSORS) 10 MG/ML IV SOLN
INTRAVENOUS | Status: DC | PRN
  Administered 2020-10-21: 40 ug via INTRAVENOUS
  Administered 2020-10-21 (×2): 80 ug via INTRAVENOUS
  Administered 2020-10-21: 120 ug via INTRAVENOUS
  Administered 2020-10-21: 80 ug via INTRAVENOUS
  Administered 2020-10-21: 40 ug via INTRAVENOUS
  Administered 2020-10-21: 80 ug via INTRAVENOUS

## 2020-10-21 MED ORDER — EPHEDRINE SULFATE 50 MG/ML IJ SOLN
INTRAMUSCULAR | Status: DC | PRN
  Administered 2020-10-21: 5 mg via INTRAVENOUS

## 2020-10-21 MED ORDER — PROPOFOL 500 MG/50ML IV EMUL
INTRAVENOUS | Status: DC | PRN
  Administered 2020-10-21: 120 ug/kg/min via INTRAVENOUS

## 2020-10-21 MED ORDER — PROPOFOL 10 MG/ML IV BOLUS
INTRAVENOUS | Status: DC | PRN
  Administered 2020-10-21 (×3): 20 mg via INTRAVENOUS

## 2020-10-21 NOTE — Discharge Instructions (Signed)
TEE  YOU HAD AN CARDIAC PROCEDURE TODAY: Refer to the procedure report and other information in the discharge instructions given to you for any specific questions about what was found during the examination. If this information does not answer your questions, please call [facility] office at [phone] to clarify.   DIET: Your first meal following the procedure should be a light meal and then it is ok to progress to your normal diet. A half-sandwich or bowl of soup is an example of a good first meal. Heavy or fried foods are harder to digest and may make you feel nauseous or bloated. Drink plenty of fluids but you should avoid alcoholic beverages for 24 hours. If you had a esophageal dilation, please see attached instructions for diet.   ACTIVITY: Your care partner should take you home directly after the procedure. You should plan to take it easy, moving slowly for the rest of the day. You can resume normal activity the day after the procedure however YOU SHOULD NOT DRIVE, use power tools, machinery or perform tasks that involve climbing or major physical exertion for 24 hours (because of the sedation medicines used during the test).   SYMPTOMS TO REPORT IMMEDIATELY: A cardiologist can be reached at any hour. Please call [phone] for any of the following symptoms:  Vomiting of blood or coffee ground material  New, significant abdominal pain  New, significant chest pain or pain under the shoulder blades  Painful or persistently difficult swallowing  New shortness of breath  Black, tarry-looking or red, bloody stools  FOLLOW UP:  Please also call with any specific questions about appointments or follow up tests.  Electrical Cardioversion Electrical cardioversion is the delivery of a jolt of electricity to restore a normal rhythm to the heart. A rhythm that is too fast or is not regular keeps the heart from pumping well. In this procedure, sticky patches or metal paddles are placed on  the chest to deliver electricity to the heart from a device. This procedure may be done in an emergency if:  There is low or no blood pressure as a result of the heart rhythm.  Normal rhythm must be restored as fast as possible to protect the brain and heart from further damage.  It may save a life. This may also be a scheduled procedure for irregular or fast heart rhythms that are not immediately life-threatening.  What can I expect after the procedure?  Your blood pressure, heart rate, breathing rate, and blood oxygen level will be monitored until you leave the hospital or clinic.  Your heart rhythm will be watched to make sure it does not change.  You may have some redness on the skin where the shocks were given. Over the counter cortizone cream may be helpful.  Follow these instructions at home:  Do not drive for 24 hours if you were given a sedative during your procedure.  Take over-the-counter and prescription medicines only as told by your health care provider.  Ask your health care provider how to check your pulse. Check it often.  Rest for 48 hours after the procedure or as told by your health care provider.  Avoid or limit your caffeine use as told by your health care provider.  Keep all follow-up visits as told by your health care provider. This is important. Contact a health care provider if:  You feel like your heart is beating too quickly or your pulse is not regular.  You have a serious muscle cramp  that does not go away. Get help right away if:  You have discomfort in your chest.  You are dizzy or you feel faint.  You have trouble breathing or you are short of breath.  Your speech is slurred.  You have trouble moving an arm or leg on one side of your body.  Your fingers or toes turn cold or blue. Summary  Electrical cardioversion is the delivery of a jolt of electricity to restore a normal rhythm to the heart.  This procedure may be done right away  in an emergency or may be a scheduled procedure if the condition is not an emergency.  Generally, this is a safe procedure.  After the procedure, check your pulse often as told by your health care provider. This information is not intended to replace advice given to you by your health care provider. Make sure you discuss any questions you have with your health care provider. Document Revised: 12/16/2018 Document Reviewed: 12/16/2018 Elsevier Patient Education  [place].

## 2020-10-21 NOTE — H&P (Signed)
ATRIAL FIB OFFICE VISIT  09/29/2020 [facility]   Fenton, [place]oshare[place] R, PA  Cardiology  Persistent atrial fibrillation ([place]) +1 more  Dx  Referred by Michell Heinrich, DO  Reason for Visit    Additional Documentation  Vitals:  BP 122/74  Pulse 70  Ht 5\' 9"  (1.753 m)  Wt 75.8 kg  BMI 24.66 kg/m  BSA 1.92 m  Flowsheets:  CHADS2-VASc Score,  NEWS,  MEWS Score,  Anthropometrics,  Method of Visit    Encounter Info:  Billing Info,  History,  Allergies,  Detailed Report     All Notes   Progress Notes by Oliver Barre, PA at 09/29/2020 3:00 PM  Author: Oliver Barre, PA Author Type: Physician Assistant Filed: 09/29/2020 3:39 PM  Note Status: Signed Cosign: Cosign Not Required Date of Service: 09/29/2020 3:00 PM  Editor: Oliver Barre, PA (Physician Assistant)                  Primary Care Physician: Michell Heinrich, DO Primary Cardiologist: Dr Haroldine Laws  Primary Electrophysiologist: Dr Caryl Comes Referring Physician: Device clinic/Dr Osamah Schmader [facility] is a 70 y.o. male with a history of mech AVR, VF arrest s/p ICD, CAD s/p CABG, chronic systolic CHF, AAA, and atrial fibrillation who presents for follow up in the [facility]. Has h/o congenital AoV abnormality with AI. In 1994 had MI and underwent PCI and mechanical AVR at [facility]. Has h/o polysubstance abuse with cocaine, ETOH and tobacco abuse. No longer doing cocaine or smoking. He had VF arrest on 05/18/16. Found to have 3v CAD and underwent CABG withre-domechanicalAVR. Post-operatively had AF (treated with amio) and CHB requiring implantation of a BosSci CRT-D device. Patient is on warfarin for a CHADS2VASC score of 3.   On follow up today, the device clinic received an alert for an ongoing afib episode starting 09/17/20. He had a cholecystectomy on 09/14/20. Over the last couple of weeks, he has felt much more fatigued than usual. He denies any bleed issues on  anticoagulation.   Today, he denies symptoms of palpitations, chest pain, shortness of breath, orthopnea, PND, lower extremity edema, dizziness, presyncope, syncope, snoring, daytime somnolence, bleeding, or neurologic sequela. The patient is tolerating medications without difficulties and is otherwise without complaint today.    Atrial Fibrillation Risk Factors:  he does not have symptoms or diagnosis of sleep apnea. he does not have a history of rheumatic fever. he does have a history of alcohol use.  he has a BMI of Body mass index is 24.66 kg/m.Marland Kitchen    Filed Weights   09/29/20 1501  Weight: 75.8 kg         Family History  Problem Relation Age of Onset  . Hypertension Father      Atrial Fibrillation Management history:  Previous antiarrhythmic drugs: amiodarone  Previous cardioversions: none Previous ablations: none CHADS2VASC score: 3 Anticoagulation history: warfarin        Past Medical History:  Diagnosis Date  . Atherosclerotic heart disease of native coronary artery without angina pectoris   . Chronic anticoagulation 02/18/2018  . Chronic systolic heart failure ([place]) 02/02/2017  . Essential thrombocythemia ([place]) 02/15/2017  . Hyperlipidemia   . Hypertension   . Ischemic cardiomyopathy   . Leg pain, central, left 04/17/2017   Hx sciatica - pain similar; no red flag signs on hx or exam   . Presence of other heart-valve replacement   . Ventricular tachycardia ([place])  Past Surgical History:  Procedure Laterality Date  . BIV ICD INSERTION CRT-D    . CORONARY ARTERY BYPASS GRAFT  2017  . MECHANICAL AORTIC VALVE REPLACEMENT            Current Outpatient Medications  Medication Sig Dispense Refill  . acetaminophen (TYLENOL) 650 MG CR tablet Take 650 mg by mouth every 8 (eight) hours as needed for pain.    Marland Kitchen amiodarone (PACERONE) 200 MG tablet Take 0.5 tablets (100 mg total) by mouth daily. 45 tablet 3  . aspirin 81 MG  tablet Take 1 tablet by mouth.    Marland Kitchen atorvastatin (LIPITOR) 40 MG tablet TAKE 1 TABLET(40 MG) BY MOUTH AT BEDTIME 90 tablet 1  . benzonatate (TESSALON) 100 MG capsule Take 100 mg by mouth 3 (three) times daily.    . carvedilol (COREG) 3.125 MG tablet TAKE 1 TABLET(3.125 MG) BY MOUTH TWICE DAILY WITH A MEAL 180 tablet 3  . dapagliflozin propanediol (FARXIGA) 10 MG TABS tablet Take 1 tablet (10 mg total) by mouth daily. 90 tablet 3  . hydroxyurea (HYDREA) 500 MG capsule TAKE 1 CAPSULE BY MOUTH ALTERNATING WITH 2 CAPSULES DAILY(500 MG ONE DAY AND 1000 MG NEXT DAY). 135 capsule 3  . levothyroxine (SYNTHROID) 75 MCG tablet Take 75 mcg by mouth daily.    . sacubitril-valsartan (ENTRESTO) 24-26 MG Take 1 tablet by mouth daily.    . sildenafil (REVATIO) 20 MG tablet Take 20 mg by mouth 2 (two) times daily.    Marland Kitchen spironolactone (ALDACTONE) 25 MG tablet TAKE 1/2 TABLET(12.5 MG) BY MOUTH DAILY 45 tablet 3  . warfarin (COUMADIN) 5 MG tablet Take 1/2 tablet daily except 1 tablet on Mondays and Thursdays or as directed 30 tablet 4   No current facility-administered medications for this encounter.         Allergies  Allergen Reactions  . Erythromycin Rash  . Atorvastatin Calcium Other (See Comments)  . Rosuvastatin Calcium     Unknown  . Simvastatin     Unknown  . Zithromax [Azithromycin] Rash    Social History        Socioeconomic History  . Marital status: Divorced    Spouse name: Not on file  . Number of children: Not on file  . Years of education: Not on file  . Highest education level: Not on file  Occupational History  . Not on file  Tobacco Use  . Smoking status: Former Smoker    Packs/day: 1.00    Years: 30.00    Pack years: 30.00    Types: Cigarettes    Start date: 05/26/1979    Quit date: 05/18/2016    Years since quitting: 4.3  . Smokeless tobacco: Never Used  Vaping Use  . Vaping Use: Never used  Substance and Sexual Activity  .  Alcohol use: Yes    Alcohol/week: 10.0 standard drinks    Types: 10 Cans of beer per week    Comment: 1 can a day..  . Drug use: No  . Sexual activity: Not on file  Other Topics Concern  . Not on file  Social History Narrative  . Not on file   Social Determinants of Health   Financial Resource Strain: Not on file  Food Insecurity: Not on file  Transportation Needs: Not on file  Physical Activity: Not on file  Stress: Not on file  Social Connections: Not on file  Intimate Partner Violence: Not on file     ROS- All systems are reviewed  and negative except as per the HPI above.  Physical Exam:    Vitals:   09/29/20 1501  BP: 122/74  Pulse: 70  Weight: 75.8 kg  Height: 5\' 9"  (1.753 m)    GEN- The patient is a well appearing male, alert and oriented x 3 today.  Head- normocephalic, atraumatic Eyes-  Sclera clear, conjunctiva pink Ears- hearing intact Oropharynx- clear Neck- supple  Lungs- Clear to ausculation bilaterally, normal work of breathing Heart- Regular rate and rhythm, no murmurs, rubs or gallops  GI- soft, NT, ND, + BS Extremities- no clubbing, cyanosis, or edema MS- no significant deformity or atrophy Skin- no rash or lesion Psych- euthymic mood, full affect Neuro- strength and sensation are intact     Wt Readings from Last 3 Encounters:  09/29/20 75.8 kg  06/24/20 80 kg  06/09/20 76.7 kg    EKG today demonstrates  V pacing with underlying afib Vent. rate 70 BPM PR interval * ms QRS duration 148 ms QT/QTcB 496/535 ms  Echo 03/20/19 demonstrated  1. Left ventricular ejection fraction, by visual estimation, is 20 to  25%. The left ventricle has severely decreased function. Severely  increased left ventricular size. There is no left ventricular hypertrophy.  2. Left ventricular diastolic Doppler parameters are consistent with  restrictive filling pattern of LV diastolic filling.  3. Global diffuse hypokinesis. Chambers  measure slightly more dilated  than prior.  4. Global right ventricle has normal systolic function.The right  ventricular size is normal. No increase in right ventricular wall  thickness.  5. Left atrial size was moderately dilated.  6. Right atrial size was moderately dilated.  7. The mitral valve is normal in structure. Trace mitral valve  regurgitation.  8. The tricuspid valve is normal in structure. Tricuspid valve  regurgitation is mild.  9. Aortic valve regurgitation is mild by color flow Doppler.  10. Bileaflet mechanical prosthesis in the aortic valve position.  11. The pulmonic valve was grossly normal. Pulmonic valve regurgitation is  mild by color flow Doppler.  12. The aortic root was not well visualized.  13. Normal pulmonary artery systolic pressure.  14. The tricuspid regurgitant velocity is 2.40 m/s, and with an assumed  right atrial pressure of 3 mmHg, the estimated right ventricular systolic  pressure is normal at 26.1 mmHg.  15. The inferior vena cava is normal in size with greater than 50%  respiratory variability, suggesting right atrial pressure of 3 mmHg.   Epic records are reviewed at length today  CHA2DS2-VASc Score = 3  The patient's score is based upon: CHF History: Yes HTN History: No Diabetes History: No Stroke History: No Vascular Disease History: Yes Age Score: 1 Gender Score: 0      ASSESSMENT AND PLAN: 1. Persistent Atrial Fibrillation  The patient's CHA2DS2-VASc score is 3, indicating a 3.2% annual risk of stroke.   Patient in symptomatic rate controlled afib. Suspect 2/2 recent surgery. We discussed therapeutic options today. Will plan for DCCV. given his severe fatigue, will opt for TEE instead of waiting for 4 therapeutic INRs. Check bmet/cbc Continue amiodarone 100 mg daily Continue Coreg 3.125 mg BID Continue warfarin   2. Secondary Hypercoagulable State (ICD10:  D68.69) The patient is at significant risk for  stroke/thromboembolism based upon his CHA2DS2-VASc Score of 3.  Continue Warfarin (Coumadin).   3. Chronic systolic CHF No signs or symptoms of fluid overload. [facility]ed in the [facility].  4. CAD S/p CABG 2017 No anginal symptoms.  5. AVR S/p mech  AVR. On warfarin.  6. VF S/p CRT-D, followed by Dr Caryl Comes and the device clinic.   Follow up in the AF clinic post DCCV.    [facility] [place], [place] [address] [phone] 09/29/2020 3:09 PM       For TEE/DCCV; no changes; INR 3.4. Aaron Edelman Lakeia Bradshaw'MD

## 2020-10-21 NOTE — Anesthesia Preprocedure Evaluation (Signed)
Anesthesia Evaluation  Patient identified by MRN, date of birth, ID band Patient awake    Reviewed: Allergy & Precautions, NPO status , Patient's Chart, lab work & pertinent test results  Airway Mallampati: II  TM Distance: >3 FB Neck ROM: Full    Dental  (+) Teeth Intact   Pulmonary neg pulmonary ROS, former smoker,    Pulmonary exam normal        Cardiovascular hypertension, Pt. on medications and Pt. on home beta blockers + CAD, + CABG and +CHF  + Cardiac Defibrillator  Rhythm:Irregular Rate:Normal     Neuro/Psych negative neurological ROS  negative psych ROS   GI/Hepatic negative GI ROS, Neg liver ROS,   Endo/Other  negative endocrine ROS  Renal/GU negative Renal ROS  negative genitourinary   Musculoskeletal negative musculoskeletal ROS (+)   Abdominal (+)  Abdomen: soft.    Peds  Hematology  (+) anemia ,   Anesthesia Other Findings   Reproductive/Obstetrics                             Anesthesia Physical Anesthesia Plan  ASA: III  Anesthesia Plan: MAC   Post-op Pain Management:    Induction: Intravenous  PONV Risk Score and Plan: Propofol infusion and Treatment may vary due to age or medical condition  Airway Management Planned: Natural Airway, Nasal Cannula and Simple Face Mask  Additional Equipment: None  Intra-op Plan:   Post-operative Plan:   Informed Consent: I have reviewed the patients History and Physical, chart, labs and discussed the procedure including the risks, benefits and alternatives for the proposed anesthesia with the patient or authorized representative who has indicated his/her understanding and acceptance.     Dental advisory given  Plan Discussed with: CRNA  Anesthesia Plan Comments: (Lab Results      Component                Value               Date                      WBC                      5.1                 09/29/2020                 HGB                      11.6 (L)            09/29/2020                HCT                      34.6 (L)            09/29/2020                MCV                      116.9 (H)           09/29/2020                PLT  686 (H)             09/29/2020          )        Anesthesia Quick Evaluation

## 2020-10-21 NOTE — Progress Notes (Signed)
    Transesophageal Echocardiogram Note  Court Gracia [id number] 03-29-1951  Procedure: Transesophageal Echocardiogram Indications: Atrial fibrillation  Procedure Details Consent: Obtained Time Out: Verified patient identification, verified procedure, site/side was marked, verified correct patient position, special equipment/implants available, Radiology Safety Procedures followed,  medications/allergies/relevent history reviewed, required imaging and test results available.  Performed  Medications:  Pt sedated by anesthesia with diprovan mg IV total.  Severe LV dysfunction; no LAA thrombus.  Pt subsequently underwent successful DCCV with 120J to sinus rhythm; no immediate complications. Continue coumadin with goal INR 2-3.    Complications: No apparent complications Patient did tolerate procedure well.  Kirk Ruths, MD

## 2020-10-21 NOTE — Interval H&P Note (Signed)
History and Physical Interval Note:  10/21/2020 12:13 PM  [facility]  has presented today for surgery, with the diagnosis of A-FIB.  The various methods of treatment have been discussed with the patient and family. After consideration of risks, benefits and other options for treatment, the patient has consented to  Procedure(s): TRANSESOPHAGEAL ECHOCARDIOGRAM (TEE) (N/A) CARDIOVERSION (N/A) as a surgical intervention.  The patient's history has been reviewed, patient examined, no change in status, stable for surgery.  I have reviewed the patient's chart and labs.  Questions were answered to the patient's satisfaction.     Kirk Ruths

## 2020-10-21 NOTE — Patient Instructions (Signed)
Pending TEE/DCCV  on 10/21/20  2nd therapeutic INR today Take 1/2 tablet tonight then decrease dose to 1 tablet Sundays, Tuesdays and Thursdays and 1/2 tablet all other days Cou[facility] [phone]

## 2020-10-21 NOTE — Transfer of Care (Signed)
Immediate Anesthesia Transfer of Care Note  Patient: Taylor [name]  Procedure(s) Performed: TRANSESOPHAGEAL ECHOCARDIOGRAM (TEE) (N/A ) CARDIOVERSION (N/A )  Patient Location: Endoscopy Unit  Anesthesia Type:MAC  Level of Consciousness: drowsy and patient cooperative  Airway & Oxygen Therapy: Patient Spontanous Breathing and Patient connected to nasal cannula oxygen  Post-op Assessment: Report given to RN and Post -op Vital signs reviewed and stable  Post vital signs: Reviewed and stable  Last Vitals:  Vitals Value Taken Time  BP 91/58 10/21/20 1312  Temp    Pulse 71 10/21/20 1313  Resp 15 10/21/20 1313  SpO2 98 % 10/21/20 1313  Vitals shown include unvalidated device data.  Last Pain:  Vitals:   10/21/20 1312  TempSrc:   PainSc: 0-No pain         Complications: No complications documented.

## 2020-10-21 NOTE — Anesthesia Procedure Notes (Addendum)
Procedure Name: MAC Date/Time: 10/21/2020 12:33 PM Performed by: Kathryne Hitch, CRNA Pre-anesthesia Checklist: Patient identified, Emergency Drugs available, Suction available, Patient being monitored and Timeout performed Patient Re-evaluated:Patient Re-evaluated prior to induction Oxygen Delivery Method: Simple face mask Induction Type: IV induction Dental Injury: Teeth and Oropharynx as per pre-operative assessment

## 2020-10-21 NOTE — Telephone Encounter (Signed)
Advanced Heart Failure Patient Advocate Encounter  I received a message that the patient has yet to receive his [place]. I called and left the patient a message with the phone number to [facility], [phone]. Advised that patient to call with any other issues.  Charlann Boxer, CPhT

## 2020-10-21 NOTE — Anesthesia Postprocedure Evaluation (Signed)
Anesthesia Post Note  Patient: Taylor [name]  Procedure(s) Performed: TRANSESOPHAGEAL ECHOCARDIOGRAM (TEE) (N/A ) CARDIOVERSION (N/A )     Patient location during evaluation: Endoscopy Anesthesia Type: MAC Level of consciousness: awake and alert Pain management: pain level controlled Vital Signs Assessment: post-procedure vital signs reviewed and stable Respiratory status: spontaneous breathing, nonlabored ventilation, respiratory function stable and patient connected to nasal cannula oxygen Cardiovascular status: stable and blood pressure returned to baseline Postop Assessment: no apparent nausea or vomiting Anesthetic complications: no   No complications documented.  Last Vitals:  Vitals:   10/21/20 1322 10/21/20 1332  BP: 99/70 (!) 100/59  Pulse: 72 65  Resp: 17 13  Temp:    SpO2: 99% 99%    Last Pain:  Vitals:   10/21/20 1332  TempSrc:   PainSc: 0-No pain                 Belenda Cruise P Geronimo Diliberto

## 2020-10-24 ENCOUNTER — Encounter (HOSPITAL_COMMUNITY): Payer: Self-pay | Admitting: Cardiology

## 2020-10-27 ENCOUNTER — Ambulatory Visit: Payer: Medicare Other | Admitting: *Deleted

## 2020-10-27 DIAGNOSIS — Z5181 Encounter for therapeutic drug level monitoring: Secondary | ICD-10-CM

## 2020-10-27 DIAGNOSIS — Z952 Presence of prosthetic heart valve: Secondary | ICD-10-CM

## 2020-10-27 LAB — POCT INR: INR: 3.1 — AB (ref 2.0–3.0)

## 2020-10-27 NOTE — Patient Instructions (Signed)
TEE/DCCV  on 10/21/20  1st post DCCV Continue warfarin 1 tablet Sundays, Tuesdays and Thursdays and 1/2 tablet all other days Recheck in 1 wk Coumadin Clinic [phone]

## 2020-10-28 ENCOUNTER — Other Ambulatory Visit: Payer: Self-pay

## 2020-10-28 ENCOUNTER — Encounter (HOSPITAL_COMMUNITY): Payer: Self-pay

## 2020-10-28 ENCOUNTER — Ambulatory Visit (HOSPITAL_COMMUNITY)
Admission: RE | Admit: 2020-10-28 | Discharge: 2020-10-28 | Disposition: A | Discharge status: Home / Self Care | Payer: Medicare Other | Source: Ambulatory Visit | Attending: Physician Assistant | Admitting: Physician Assistant

## 2020-10-28 VITALS — BP 118/64 | HR 66 | Ht 69.0 in | Wt 164.0 lb

## 2020-10-28 DIAGNOSIS — I5022 Chronic systolic (congestive) heart failure: Secondary | ICD-10-CM | POA: Diagnosis not present

## 2020-10-28 DIAGNOSIS — D6869 Other thrombophilia: Secondary | ICD-10-CM | POA: Insufficient documentation

## 2020-10-28 DIAGNOSIS — Z79899 Other long term (current) drug therapy: Secondary | ICD-10-CM | POA: Diagnosis not present

## 2020-10-28 DIAGNOSIS — Z951 Presence of aortocoronary bypass graft: Secondary | ICD-10-CM | POA: Insufficient documentation

## 2020-10-28 DIAGNOSIS — I4819 Other persistent atrial fibrillation: Secondary | ICD-10-CM | POA: Diagnosis present

## 2020-10-28 DIAGNOSIS — Z7982 Long term (current) use of aspirin: Secondary | ICD-10-CM | POA: Insufficient documentation

## 2020-10-28 DIAGNOSIS — Z7901 Long term (current) use of anticoagulants: Secondary | ICD-10-CM | POA: Insufficient documentation

## 2020-10-28 DIAGNOSIS — I2510 Atherosclerotic heart disease of native coronary artery without angina pectoris: Secondary | ICD-10-CM | POA: Diagnosis not present

## 2020-10-28 DIAGNOSIS — I714 Abdominal aortic aneurysm, without rupture: Secondary | ICD-10-CM | POA: Diagnosis not present

## 2020-10-28 DIAGNOSIS — Z87891 Personal history of nicotine dependence: Secondary | ICD-10-CM | POA: Insufficient documentation

## 2020-10-28 NOTE — Progress Notes (Signed)
Primary Care Physician: Michell Heinrich, DO Primary Cardiologist: Dr Haroldine Laws  Primary Electrophysiologist: Dr Caryl Comes Referring Physician: Device clinic/Dr Clayson Riling [facility] is a 70 y.o. male with a history of mech AVR, VF arrest s/p ICD, CAD s/p CABG, chronic systolic CHF, AAA, and atrial fibrillation who presents for follow up in the [facility]. Has h/o congenital AoV abnormality with AI. In 1994 had MI and underwent PCI and mechanical AVR at [facility]. Has h/o polysubstance abuse with cocaine, ETOH and tobacco abuse. No longer doing cocaine or smoking. He had VF arrest on 05/18/16. Found to have 3v CAD and underwent CABG with re-do mechanical AVR. Post-operatively had AF (treated with amio) and CHB requiring implantation of a BosSci CRT-D device. Patient is on warfarin for a CHADS2VASC score of 3. The device clinic received an alert for an ongoing afib episode starting 09/17/20. He had a cholecystectomy on 09/14/20.   On follow up today, patient is s/p DCCV on 10/21/20. He does feel much less fatigued. He remains in SR.   Today, he denies symptoms of palpitations, chest pain, shortness of breath, orthopnea, PND, lower extremity edema, dizziness, presyncope, syncope, snoring, daytime somnolence, bleeding, or neurologic sequela. The patient is tolerating medications without difficulties and is otherwise without complaint today.    Atrial Fibrillation Risk Factors:  he does not have symptoms or diagnosis of sleep apnea. he does not have a history of rheumatic fever. he does have a history of alcohol use.  he has a BMI of Body mass index is 24.22 kg/m.Marland Kitchen Filed Weights   10/28/20 1029  Weight: 74.4 kg    Family History  Problem Relation Age of Onset  . Hypertension Father      Atrial Fibrillation Management history:  Previous antiarrhythmic drugs: amiodarone  Previous cardioversions: 10/21/20 Previous ablations: none CHADS2VASC score: 3 Anticoagulation  history: warfarin    Past Medical History:  Diagnosis Date  . Atherosclerotic heart disease of native coronary artery without angina pectoris   . Chronic anticoagulation 02/18/2018  . Chronic systolic heart failure ([place]) 02/02/2017  . Essential thrombocythemia ([place]) 02/15/2017  . Hyperlipidemia   . Hypertension   . Ischemic cardiomyopathy   . Leg pain, central, left 04/17/2017   Hx sciatica - pain similar; no red flag signs on hx or exam   . Presence of other heart-valve replacement   . Ventricular tachycardia [facility])    Past Surgical History:  Procedure Laterality Date  . BIV ICD INSERTION CRT-D    . CARDIOVERSION N/A 10/21/2020   Procedure: CARDIOVERSION;  Surgeon: Lelon Perla, MD;  Location: [facility];  Service: Cardiovascular;  Laterality: N/A;  . CORONARY ARTERY BYPASS GRAFT  2017  . MECHANICAL AORTIC VALVE REPLACEMENT    . TEE WITHOUT CARDIOVERSION N/A 10/21/2020   Procedure: TRANSESOPHAGEAL ECHOCARDIOGRAM (TEE);  Surgeon: Lelon Perla, MD;  Location: [facility] ENDOSCOPY;  Service: Cardiovascular;  Laterality: N/A;    Current Outpatient Medications  Medication Sig Dispense Refill  . amiodarone (PACERONE) 200 MG tablet Take 0.5 tablets (100 mg total) by mouth daily. 45 tablet 3  . aspirin 81 MG tablet Take 81 mg by mouth daily.    Marland Kitchen atorvastatin (LIPITOR) 40 MG tablet TAKE 1 TABLET(40 MG) BY MOUTH AT BEDTIME (Patient taking differently: Take 40 mg by mouth daily.) 90 tablet 1  . carvedilol (COREG) 3.125 MG tablet TAKE 1 TABLET(3.125 MG) BY MOUTH TWICE DAILY WITH A MEAL (Patient taking differently: Take 3.125 mg by mouth 2 (two)  times daily with a meal.) 180 tablet 3  . dapagliflozin propanediol (FARXIGA) 10 MG TABS tablet Take 1 tablet (10 mg total) by mouth daily. 90 tablet 3  . hydroxyurea (HYDREA) 500 MG capsule TAKE 1 CAPSULE BY MOUTH ALTERNATING WITH 2 CAPSULES DAILY(500 MG ONE DAY AND 1000 MG NEXT DAY). (Patient taking differently: Take 500-1,000 mg by mouth See admin  instructions. TAKE 1 CAPSULE BY MOUTH ALTERNATING WITH 2 CAPSULES DAILY(500 MG ONE DAY AND 1000 MG NEXT DAY).) 135 capsule 3  . levothyroxine (SYNTHROID) 75 MCG tablet Take 75 mcg by mouth daily before breakfast.    . sacubitril-valsartan (ENTRESTO) 24-26 MG Take 1 tablet by mouth daily.    . sildenafil (REVATIO) 20 MG tablet Take 20 mg by mouth as needed.    Marland Kitchen spironolactone (ALDACTONE) 25 MG tablet TAKE 1/2 TABLET(12.5 MG) BY MOUTH DAILY (Patient taking differently: Take 12.5 mg by mouth daily.) 45 tablet 3  . warfarin (COUMADIN) 5 MG tablet Take 1/2 tablet daily except 1 tablet on Mondays and Thursdays or as directed (Patient taking differently: Take 2.5-5 mg by mouth See admin instructions. 5 mg on Mondays Thurs and Sat , 2.5 mg all other days) 30 tablet 4   No current facility-administered medications for this encounter.    Allergies  Allergen Reactions  . Erythromycin Rash  . Atorvastatin Calcium Other (See Comments)  . Rosuvastatin Calcium     Unknown  . Simvastatin     Unknown  . Zithromax [Azithromycin] Rash    Social History   Socioeconomic History  . Marital status: Divorced    Spouse name: Not on file  . Number of children: Not on file  . Years of education: Not on file  . Highest education level: Not on file  Occupational History  . Not on file  Tobacco Use  . Smoking status: Former Smoker    Packs/day: 1.00    Years: 30.00    Pack years: 30.00    Types: Cigarettes    Start date: 05/26/1979    Quit date: 05/18/2016    Years since quitting: 4.4  . Smokeless tobacco: Never Used  Vaping Use  . Vaping Use: Never used  Substance and Sexual Activity  . Alcohol use: Yes    Alcohol/week: 10.0 standard drinks    Types: 10 Cans of beer per week    Comment: 1 can a day..  . Drug use: No  . Sexual activity: Not on file  Other Topics Concern  . Not on file  Social History Narrative  . Not on file   Social Determinants of Health   Financial Resource Strain: Not  on file  Food Insecurity: Not on file  Transportation Needs: Not on file  Physical Activity: Not on file  Stress: Not on file  Social Connections: Not on file  Intimate Partner Violence: Not on file     ROS- All systems are reviewed and negative except as per the HPI above.  Physical Exam: Vitals:   10/28/20 1029  BP: 118/64  Pulse: 66  Weight: 74.4 kg  Height: 5\' 9"  (1.753 m)    GEN- The patient is a well appearing male, alert and oriented x 3 today.    HEENT-head normocephalic, atraumatic, sclera clear, conjunctiva pink, hearing intact, trachea midline. Lungs- Clear to ausculation bilaterally, normal work of breathing Heart- Regular rate and rhythm, no murmurs, rubs or gallops. mech S2 GI- soft, NT, ND, + BS Extremities- no clubbing, cyanosis, or edema MS- no significant deformity  or atrophy Skin- no rash or lesion Psych- euthymic mood, full affect Neuro- strength and sensation are intact   Wt Readings from Last 3 Encounters:  10/28/20 74.4 kg  10/21/20 72.6 kg  09/29/20 75.8 kg    EKG today demonstrates  A sense V paced rhythm, PVCs Vent. rate 66 BPM PR interval 136 ms QRS duration 160 ms QT/QTcB 524/549 ms  Echo 03/20/19 demonstrated  1. Left ventricular ejection fraction, by visual estimation, is 20 to  25%. The left ventricle has severely decreased function. Severely  increased left ventricular size. There is no left ventricular hypertrophy.  2. Left ventricular diastolic Doppler parameters are consistent with  restrictive filling pattern of LV diastolic filling.  3. Global diffuse hypokinesis. Chambers measure slightly more dilated  than prior.  4. Global right ventricle has normal systolic function.The right  ventricular size is normal. No increase in right ventricular wall  thickness.  5. Left atrial size was moderately dilated.  6. Right atrial size was moderately dilated.  7. The mitral valve is normal in structure. Trace mitral valve   regurgitation.  8. The tricuspid valve is normal in structure. Tricuspid valve  regurgitation is mild.  9. Aortic valve regurgitation is mild by color flow Doppler.  10. Bileaflet mechanical prosthesis in the aortic valve position.  11. The pulmonic valve was grossly normal. Pulmonic valve regurgitation is  mild by color flow Doppler.  12. The aortic root was not well visualized.  13. Normal pulmonary artery systolic pressure.  14. The tricuspid regurgitant velocity is 2.40 m/s, and with an assumed  right atrial pressure of 3 mmHg, the estimated right ventricular systolic  pressure is normal at 26.1 mmHg.  15. The inferior vena cava is normal in size with greater than 50%  respiratory variability, suggesting right atrial pressure of 3 mmHg.   Epic records are reviewed at length today  CHA2DS2-VASc Score = 3  The patient's score is based upon: CHF History: Yes HTN History: No Diabetes History: No Stroke History: No Vascular Disease History: Yes Age Score: 1 Gender Score: 0      ASSESSMENT AND PLAN: 1. Persistent Atrial Fibrillation  The patient's CHA2DS2-VASc score is 3, indicating a 3.2% annual risk of stroke.   S/p TEE/DCCV on 10/21/20 Patient appears to be maintaining SR. Continue amiodarone 100 mg daily Continue Coreg 3.125 mg BID Continue warfarin   2. Secondary Hypercoagulable State (ICD10:  D68.69) The patient is at significant risk for stroke/thromboembolism based upon his CHA2DS2-VASc Score of 3.  Continue Warfarin (Coumadin).   3. Chronic systolic CHF No signs or symptoms of fluid overload. Followed in the [facility].  4. CAD S/p CABG 2017 No anginal symptoms.  5. AVR S/p mech AVR. On warfarin.  6. VF S/p CRT-D, followed by Dr Caryl Comes and the device clinic.   Follow up with Dr Caryl Comes or EP APP in 3 months.    [facility] [address] [place], [place] [address] [phone] 10/28/2020 10:59 AM

## 2020-10-28 NOTE — Progress Notes (Signed)
Medication Samples have been provided to the patient.  Drug name: Wilder Glade       Strength: 10 mg        Qty: 2  LOT: [id number]   Exp.Date: 03/29/2023  Dosing instructions: Take 1 tablet daily  The patient has been instructed regarding the correct time, dose, and frequency of taking this medication, including desired effects and most common side effects.   Taylor [name] Allyiah Gartner 12:10 PM 10/28/2020

## 2020-11-04 ENCOUNTER — Ambulatory Visit: Payer: Medicare Other | Admitting: *Deleted

## 2020-11-04 ENCOUNTER — Ambulatory Visit (INDEPENDENT_AMBULATORY_CARE_PROVIDER_SITE_OTHER): Payer: Medicare Other

## 2020-11-04 DIAGNOSIS — Z952 Presence of prosthetic heart valve: Secondary | ICD-10-CM

## 2020-11-04 DIAGNOSIS — I255 Ischemic cardiomyopathy: Secondary | ICD-10-CM | POA: Diagnosis not present

## 2020-11-04 DIAGNOSIS — Z5181 Encounter for therapeutic drug level monitoring: Secondary | ICD-10-CM | POA: Diagnosis not present

## 2020-11-04 LAB — CUP PACEART REMOTE DEVICE CHECK
Battery Remaining Longevity: 84 mo
Battery Remaining Percentage: 100 %
Brady Statistic RA Percent Paced: 1 %
Brady Statistic RV Percent Paced: 84 %
Date Time Interrogation Session: 20220609013500
HighPow Impedance: 67 Ohm
Implantable Lead Implant Date: 20180105
Implantable Lead Implant Date: 20180105
Implantable Lead Implant Date: 20180105
Implantable Lead Location: 753858
Implantable Lead Location: 753859
Implantable Lead Location: 753860
Implantable Lead Model: 292
Implantable Lead Model: 4672
Implantable Lead Model: 7741
Implantable Lead Serial Number: 428973
Implantable Lead Serial Number: 801088
Implantable Lead Serial Number: 849519
Implantable Pulse Generator Implant Date: 20180105
Lead Channel Impedance Value: 619 Ohm
Lead Channel Impedance Value: 699 Ohm
Lead Channel Impedance Value: 700 Ohm
Lead Channel Pacing Threshold Amplitude: 0.7 V
Lead Channel Pacing Threshold Amplitude: 0.8 V
Lead Channel Pacing Threshold Amplitude: 1 V
Lead Channel Pacing Threshold Pulse Width: 0.4 ms
Lead Channel Pacing Threshold Pulse Width: 0.4 ms
Lead Channel Pacing Threshold Pulse Width: 1 ms
Lead Channel Setting Pacing Amplitude: 2 V
Lead Channel Setting Pacing Amplitude: 2 V
Lead Channel Setting Pacing Amplitude: 2.2 V
Lead Channel Setting Pacing Pulse Width: 0.4 ms
Lead Channel Setting Pacing Pulse Width: 1 ms
Lead Channel Setting Sensing Sensitivity: 0.6 mV
Lead Channel Setting Sensing Sensitivity: 1 mV
Pulse Gen Serial Number: 180139

## 2020-11-04 LAB — POCT INR: INR: 2.7 (ref 2.0–3.0)

## 2020-11-04 NOTE — Patient Instructions (Signed)
TEE/DCCV  on 10/21/20  2nd post DCCV Continue warfarin 1 tablet Sundays, Tuesdays and Thursdays and 1/2 tablet all other days Recheck in 1 wk [facility]in Clinic [phone]

## 2020-11-11 ENCOUNTER — Ambulatory Visit: Payer: Medicare Other | Admitting: *Deleted

## 2020-11-11 ENCOUNTER — Other Ambulatory Visit: Payer: Self-pay

## 2020-11-11 DIAGNOSIS — Z952 Presence of prosthetic heart valve: Secondary | ICD-10-CM | POA: Diagnosis not present

## 2020-11-11 DIAGNOSIS — Z5181 Encounter for therapeutic drug level monitoring: Secondary | ICD-10-CM

## 2020-11-11 LAB — POCT INR: INR: 2.6 (ref 2.0–3.0)

## 2020-11-11 NOTE — Patient Instructions (Signed)
TEE/DCCV  on 10/21/20  3rd post DCCV Continue warfarin 1 tablet Sundays, Tuesdays and Thursdays and 1/2 tablet all other days Recheck in 4 wk Cou[facility]in Clinic [phone]

## 2020-11-26 NOTE — Progress Notes (Signed)
Remote ICD transmission.   

## 2020-12-08 ENCOUNTER — Ambulatory Visit: Payer: Medicare Other | Admitting: *Deleted

## 2020-12-08 DIAGNOSIS — Z952 Presence of prosthetic heart valve: Secondary | ICD-10-CM | POA: Diagnosis not present

## 2020-12-08 DIAGNOSIS — Z5181 Encounter for therapeutic drug level monitoring: Secondary | ICD-10-CM | POA: Diagnosis not present

## 2020-12-08 LAB — POCT INR: INR: 2.7 (ref 2.0–3.0)

## 2020-12-08 NOTE — Patient Instructions (Signed)
TEE/DCCV  on 10/21/20   Continue warfarin 1 tablet Sundays, Tuesdays and Thursdays and 1/2 tablet all other days Recheck in 4 wk Coumadin Clinic [phone]

## 2020-12-10 ENCOUNTER — Other Ambulatory Visit (HOSPITAL_COMMUNITY): Payer: Self-pay

## 2020-12-10 DIAGNOSIS — I5022 Chronic systolic (congestive) heart failure: Secondary | ICD-10-CM

## 2020-12-10 NOTE — Progress Notes (Signed)
Orders Placed This Encounter  Procedures   ECHOCARDIOGRAM COMPLETE    Standing Status:   Future    Standing Expiration Date:   12/10/2021    Order Specific Question:   Where should this test be performed    Answer:   [facility]    Order Specific Question:   Perflutren DEFINITY (image enhancing agent) should be administered unless hypersensitivity or allergy exist    Answer:   Administer Perflutren    Order Specific Question:   Reason for exam-Echo    Answer:   Congestive Heart Failure  I50.9    Order Specific Question:   Release to patient    Answer:   Immediate

## 2020-12-13 ENCOUNTER — Ambulatory Visit: Payer: Medicare Other

## 2020-12-13 ENCOUNTER — Other Ambulatory Visit (HOSPITAL_COMMUNITY): Payer: Medicare Other

## 2020-12-22 ENCOUNTER — Ambulatory Visit (HOSPITAL_COMMUNITY)
Admission: RE | Admit: 2020-12-22 | Discharge: 2020-12-22 | Disposition: A | Discharge status: Home / Self Care | Payer: Medicare Other | Source: Ambulatory Visit | Attending: Vascular Surgery | Admitting: Vascular Surgery

## 2020-12-22 ENCOUNTER — Ambulatory Visit: Payer: Medicare Other | Admitting: Physician Assistant

## 2020-12-22 ENCOUNTER — Other Ambulatory Visit: Payer: Self-pay

## 2020-12-22 VITALS — BP 88/60 | HR 54 | Temp 98.0°F | Resp 20 | Ht 69.0 in | Wt 161.5 lb

## 2020-12-22 DIAGNOSIS — I714 Abdominal aortic aneurysm, without rupture, unspecified: Secondary | ICD-10-CM

## 2020-12-22 NOTE — Progress Notes (Signed)
Office Note     CC:  follow up Requesting Provider:  Michell Heinrich, DO  HPI: Taylor [name] is a 70 y.o. ([birth year]) male who presents to go over vascular studies related to abdominal aortic aneurysm.  Patient's last office visit was in January of this year and at that time the AAA measured 4.6 cm.  He denies new or changing abdominal or back pain.  He also denies any tissue changes or claudication of bilateral lower extremities.  He is taking aspirin and statin daily.  He is a former smoker.   Past Medical History:  Diagnosis Date   Atherosclerotic heart disease of native coronary artery without angina pectoris    Chronic anticoagulation [id number]   Chronic systolic heart failure ([place]) 02/02/2017   Essential thrombocythemia ([place]) 02/15/2017   Hyperlipidemia    Hypertension    Ischemic cardiomyopathy    Leg pain, central, left 04/17/2017   Hx sciatica - pain similar; no red flag signs on hx or exam    Presence of other heart-valve replacement    Ventricular tachycardia [facility])     Past Surgical History:  Procedure Laterality Date   BIV ICD INSERTION CRT-D     CARDIOVERSION N/A 10/21/2020   Procedure: CARDIOVERSION;  Surgeon: Lelon Perla, MD;  Location: [facility];  Service: Cardiovascular;  Laterality: N/A;   CORONARY ARTERY BYPASS GRAFT  2017   MECHANICAL AORTIC VALVE REPLACEMENT     TEE WITHOUT CARDIOVERSION N/A 10/21/2020   Procedure: TRANSESOPHAGEAL ECHOCARDIOGRAM (TEE);  Surgeon: Lelon Perla, MD;  Location: [facility] ENDOSCOPY;  Service: Cardiovascular;  Laterality: N/A;    Social History   Socioeconomic History   Marital status: Divorced    Spouse name: Not on file   Number of children: Not on file   Years of education: Not on file   Highest education level: Not on file  Occupational History   Not on file  Tobacco Use   Smoking status: Former    Packs/day: 1.00    Years: 30.00    Pack years: 30.00    Types: Cigarettes    Start date: 05/26/1979    Quit date:  05/18/2016    Years since quitting: 4.6   Smokeless tobacco: Never  Vaping Use   Vaping Use: Never used  Substance and Sexual Activity   Alcohol use: Yes    Alcohol/week: 10.0 standard drinks    Types: 10 Cans of beer per week    Comment: 1 can a day..   Drug use: No   Sexual activity: Not on file  Other Topics Concern   Not on file  Social History Narrative   Not on file   Social Determinants of Health   Financial Resource Strain: Not on file  Food Insecurity: Not on file  Transportation Needs: Not on file  Physical Activity: Not on file  Stress: Not on file  Social Connections: Not on file  Intimate Partner Violence: Not on file    Family History  Problem Relation Age of Onset   Hypertension Father     Current Outpatient Medications  Medication Sig Dispense Refill   amiodarone (PACERONE) 200 MG tablet Take 0.5 tablets (100 mg total) by mouth daily. 45 tablet 3   aspirin 81 MG tablet Take 81 mg by mouth daily.     atorvastatin (LIPITOR) 40 MG tablet TAKE 1 TABLET(40 MG) BY MOUTH AT BEDTIME (Patient taking differently: Take 40 mg by mouth daily.) 90 tablet 1   carvedilol (COREG) 3.125 MG tablet TAKE  1 TABLET(3.125 MG) BY MOUTH TWICE DAILY WITH A MEAL (Patient taking differently: Take 3.125 mg by mouth 2 (two) times daily with a meal.) 180 tablet 3   dapagliflozin propanediol (FARXIGA) 10 MG TABS tablet Take 1 tablet (10 mg total) by mouth daily. 90 tablet 3   hydroxyurea (HYDREA) 500 MG capsule TAKE 1 CAPSULE BY MOUTH ALTERNATING WITH 2 CAPSULES DAILY(500 MG ONE DAY AND 1000 MG NEXT DAY). (Patient taking differently: Take 500-1,000 mg by mouth See admin instructions. TAKE 1 CAPSULE BY MOUTH ALTERNATING WITH 2 CAPSULES DAILY(500 MG ONE DAY AND 1000 MG NEXT DAY).) 135 capsule 3   levothyroxine (SYNTHROID) 75 MCG tablet Take 75 mcg by mouth daily before breakfast.     sacubitril-valsartan (ENTRESTO) 24-26 MG Take 1 tablet by mouth daily.     sildenafil (REVATIO) 20 MG tablet  Take 20 mg by mouth as needed.     spironolactone (ALDACTONE) 25 MG tablet TAKE 1/2 TABLET(12.5 MG) BY MOUTH DAILY (Patient taking differently: Take 12.5 mg by mouth daily.) 45 tablet 3   warfarin (COUMADIN) 5 MG tablet Take 1/2 tablet daily except 1 tablet on Mondays and Thursdays or as directed (Patient taking differently: Take 2.5-5 mg by mouth See admin instructions. 5 mg on Mondays Thurs and Sat , 2.5 mg all other days) 30 tablet 4   No current facility-administered medications for this visit.    Allergies  Allergen Reactions   Erythromycin Rash   Atorvastatin Calcium Other (See Comments)   Rosuvastatin Calcium     Unknown   Simvastatin     Unknown   Zithromax [Azithromycin] Rash     REVIEW OF SYSTEMS:   '[X]'$  denotes positive finding, '[ ]'$  denotes negative finding Cardiac  Comments:  Chest pain or chest pressure:    Shortness of breath upon exertion:    Short of breath when lying flat:    Irregular heart rhythm:        Vascular    Pain in calf, thigh, or hip brought on by ambulation:    Pain in feet at night that wakes you up from your sleep:     Blood clot in your veins:    Leg swelling:         Pulmonary    Oxygen at home:    Productive cough:     Wheezing:         Neurologic    Sudden weakness in arms or legs:     Sudden numbness in arms or legs:     Sudden onset of difficulty speaking or slurred speech:    Temporary loss of vision in one eye:     Problems with dizziness:         Gastrointestinal    Blood in stool:     Vomited blood:         Genitourinary    Burning when urinating:     Blood in urine:        Psychiatric    Major depression:         Hematologic    Bleeding problems:    Problems with blood clotting too easily:        Skin    Rashes or ulcers:        Constitutional    Fever or chills:      PHYSICAL EXAMINATION:  Vitals:   12/22/20 0910  BP: (!) 88/60  Pulse: (!) 54  Resp: 20  Temp: 98 F (36.7 C)  TempSrc: Temporal  SpO2: 97%  Weight: 161 lb 8 oz (73.3 kg)  Height: '5\' 9"'$  (1.753 m)    General:  WDWN in NAD; vital signs documented above Gait: Not observed HENT: WNL, normocephalic Pulmonary: normal non-labored breathing  Cardiac: regular HR Abdomen: soft, NT, no masses Skin: without rashes Vascular Exam/Pulses:  Right Left  Radial 2+ (normal) 2+ (normal)  DP absent absent  PT 1+ (weak) 1+ (weak)   Extremities: without ischemic changes, without Gangrene , without cellulitis; without open wounds;  Musculoskeletal: no muscle wasting or atrophy  Neurologic: A&O X 3;  No focal weakness or paresthesias are detected Psychiatric:  The pt has Normal affect.   Non-Invasive Vascular Imaging:   4.6 cm AAA at its largest diameter    ASSESSMENT/PLAN:: 70 y.o. male here for follow up for surveillance of AAA  -Size of AAA unchanged over the past 6 months based on duplex; AAA is measuring 4.6 cm at largest diameter today and remains asymptomatic -Based on updated SVS guidelines we will recheck AAA duplex in 1 year -Continue aspirin and statin daily -Patient will call/return office sooner with any questions or concerns   Dagoberto Ligas, PA-C [facility] [phone]  Clinic MD:   Scot Dock

## 2021-01-04 ENCOUNTER — Ambulatory Visit: Payer: Medicare Other | Admitting: *Deleted

## 2021-01-04 DIAGNOSIS — Z952 Presence of prosthetic heart valve: Secondary | ICD-10-CM | POA: Diagnosis not present

## 2021-01-04 DIAGNOSIS — Z5181 Encounter for therapeutic drug level monitoring: Secondary | ICD-10-CM

## 2021-01-04 LAB — POCT INR: INR: 2.2 (ref 2.0–3.0)

## 2021-01-04 NOTE — Patient Instructions (Signed)
TEE/DCCV  on 10/21/20   Continue warfarin 1 tablet Sundays, Tuesdays and Thursdays and 1/2 tablet all other days Recheck in 4 wk Coumadin Clinic [phone]

## 2021-01-27 ENCOUNTER — Other Ambulatory Visit (HOSPITAL_COMMUNITY): Payer: Self-pay | Admitting: Internal Medicine

## 2021-01-27 ENCOUNTER — Other Ambulatory Visit (HOSPITAL_COMMUNITY): Payer: Self-pay | Admitting: Cardiology

## 2021-02-01 ENCOUNTER — Encounter: Payer: Medicare Other | Admitting: Physician Assistant

## 2021-02-03 ENCOUNTER — Ambulatory Visit (HOSPITAL_BASED_OUTPATIENT_CLINIC_OR_DEPARTMENT_OTHER)
Admission: RE | Admit: 2021-02-03 | Discharge: 2021-02-03 | Disposition: A | Discharge status: Home / Self Care | Payer: Medicare Other | Source: Ambulatory Visit | Attending: Internal Medicine | Admitting: Internal Medicine

## 2021-02-03 ENCOUNTER — Other Ambulatory Visit: Payer: Self-pay

## 2021-02-03 ENCOUNTER — Ambulatory Visit (INDEPENDENT_AMBULATORY_CARE_PROVIDER_SITE_OTHER): Payer: Medicare Other

## 2021-02-03 ENCOUNTER — Encounter (HOSPITAL_COMMUNITY): Payer: Self-pay | Admitting: Internal Medicine

## 2021-02-03 ENCOUNTER — Ambulatory Visit (HOSPITAL_COMMUNITY)
Admission: RE | Admit: 2021-02-03 | Discharge: 2021-02-03 | Disposition: A | Discharge status: Home / Self Care | Payer: Medicare Other | Source: Ambulatory Visit | Attending: Family Medicine | Admitting: Family Medicine

## 2021-02-03 VITALS — BP 140/70 | HR 58 | Wt 158.6 lb

## 2021-02-03 DIAGNOSIS — I110 Hypertensive heart disease with heart failure: Secondary | ICD-10-CM | POA: Diagnosis not present

## 2021-02-03 DIAGNOSIS — I2510 Atherosclerotic heart disease of native coronary artery without angina pectoris: Secondary | ICD-10-CM | POA: Diagnosis not present

## 2021-02-03 DIAGNOSIS — Z951 Presence of aortocoronary bypass graft: Secondary | ICD-10-CM | POA: Insufficient documentation

## 2021-02-03 DIAGNOSIS — I5022 Chronic systolic (congestive) heart failure: Secondary | ICD-10-CM | POA: Diagnosis not present

## 2021-02-03 DIAGNOSIS — I252 Old myocardial infarction: Secondary | ICD-10-CM | POA: Diagnosis not present

## 2021-02-03 DIAGNOSIS — Z8249 Family history of ischemic heart disease and other diseases of the circulatory system: Secondary | ICD-10-CM | POA: Diagnosis not present

## 2021-02-03 DIAGNOSIS — I255 Ischemic cardiomyopathy: Secondary | ICD-10-CM | POA: Insufficient documentation

## 2021-02-03 DIAGNOSIS — I071 Rheumatic tricuspid insufficiency: Secondary | ICD-10-CM | POA: Insufficient documentation

## 2021-02-03 DIAGNOSIS — Z79899 Other long term (current) drug therapy: Secondary | ICD-10-CM | POA: Insufficient documentation

## 2021-02-03 DIAGNOSIS — E785 Hyperlipidemia, unspecified: Secondary | ICD-10-CM

## 2021-02-03 DIAGNOSIS — Z952 Presence of prosthetic heart valve: Secondary | ICD-10-CM

## 2021-02-03 DIAGNOSIS — Z95810 Presence of automatic (implantable) cardiac defibrillator: Secondary | ICD-10-CM | POA: Insufficient documentation

## 2021-02-03 DIAGNOSIS — Z7982 Long term (current) use of aspirin: Secondary | ICD-10-CM | POA: Diagnosis not present

## 2021-02-03 DIAGNOSIS — Z8674 Personal history of sudden cardiac arrest: Secondary | ICD-10-CM | POA: Insufficient documentation

## 2021-02-03 DIAGNOSIS — Z87891 Personal history of nicotine dependence: Secondary | ICD-10-CM | POA: Diagnosis not present

## 2021-02-03 DIAGNOSIS — Z881 Allergy status to other antibiotic agents status: Secondary | ICD-10-CM | POA: Diagnosis not present

## 2021-02-03 DIAGNOSIS — I714 Abdominal aortic aneurysm, without rupture: Secondary | ICD-10-CM | POA: Diagnosis not present

## 2021-02-03 DIAGNOSIS — Z7984 Long term (current) use of oral hypoglycemic drugs: Secondary | ICD-10-CM | POA: Insufficient documentation

## 2021-02-03 DIAGNOSIS — Z7901 Long term (current) use of anticoagulants: Secondary | ICD-10-CM | POA: Insufficient documentation

## 2021-02-03 LAB — CBC
HCT: 45.4 % (ref 39.0–52.0)
Hemoglobin: 15.6 g/dL (ref 13.0–17.0)
MCH: 39.4 pg — ABNORMAL HIGH (ref 26.0–34.0)
MCHC: 34.4 g/dL (ref 30.0–36.0)
MCV: 114.6 fL — ABNORMAL HIGH (ref 80.0–100.0)
Platelets: 524 10*3/uL — ABNORMAL HIGH (ref 150–400)
RBC: 3.96 MIL/uL — ABNORMAL LOW (ref 4.22–5.81)
RDW: 14.1 % (ref 11.5–15.5)
WBC: 4.4 10*3/uL (ref 4.0–10.5)
nRBC: 0 % (ref 0.0–0.2)

## 2021-02-03 LAB — CUP PACEART REMOTE DEVICE CHECK
Battery Remaining Longevity: 78 mo
Battery Remaining Percentage: 100 %
Brady Statistic RA Percent Paced: 1 %
Brady Statistic RV Percent Paced: 85 %
Date Time Interrogation Session: 20220908010700
HighPow Impedance: 77 Ohm
Implantable Lead Implant Date: 20180105
Implantable Lead Implant Date: 20180105
Implantable Lead Implant Date: 20180105
Implantable Lead Location: 753858
Implantable Lead Location: 753859
Implantable Lead Location: 753860
Implantable Lead Model: 292
Implantable Lead Model: 4672
Implantable Lead Model: 7741
Implantable Lead Serial Number: 428973
Implantable Lead Serial Number: 801088
Implantable Lead Serial Number: 849519
Implantable Pulse Generator Implant Date: 20180105
Lead Channel Impedance Value: 657 Ohm
Lead Channel Impedance Value: 728 Ohm
Lead Channel Impedance Value: 750 Ohm
Lead Channel Pacing Threshold Amplitude: 0.7 V
Lead Channel Pacing Threshold Amplitude: 0.8 V
Lead Channel Pacing Threshold Amplitude: 1 V
Lead Channel Pacing Threshold Pulse Width: 0.4 ms
Lead Channel Pacing Threshold Pulse Width: 0.4 ms
Lead Channel Pacing Threshold Pulse Width: 1 ms
Lead Channel Setting Pacing Amplitude: 2 V
Lead Channel Setting Pacing Amplitude: 2 V
Lead Channel Setting Pacing Amplitude: 2.2 V
Lead Channel Setting Pacing Pulse Width: 0.4 ms
Lead Channel Setting Pacing Pulse Width: 1 ms
Lead Channel Setting Sensing Sensitivity: 0.6 mV
Lead Channel Setting Sensing Sensitivity: 1 mV
Pulse Gen Serial Number: 180139

## 2021-02-03 LAB — LIPID PANEL
Cholesterol: 153 mg/dL (ref 0–200)
HDL: 54 mg/dL (ref >40)
LDL Cholesterol: 82 mg/dL (ref 0–99)
Total CHOL/HDL Ratio: 2.8 RATIO
Triglycerides: 84 mg/dL (ref <150)
VLDL: 17 mg/dL (ref 0–40)

## 2021-02-03 LAB — COMPREHENSIVE METABOLIC PANEL
ALT: 16 U/L (ref 0–44)
AST: 31 U/L (ref 15–41)
Albumin: 4.2 g/dL (ref 3.5–5.0)
Alkaline Phosphatase: 60 U/L (ref 38–126)
Anion gap: 11 (ref 5–15)
BUN: 14 mg/dL (ref 8–23)
CO2: 22 mmol/L (ref 22–32)
Calcium: 9.3 mg/dL (ref 8.9–10.3)
Chloride: 98 mmol/L (ref 98–111)
Creatinine, Ser: 0.99 mg/dL (ref 0.61–1.24)
GFR, Estimated: >60 mL/min (ref >60)
Glucose, Bld: 92 mg/dL (ref 70–99)
Potassium: 4.9 mmol/L (ref 3.5–5.1)
Sodium: 131 mmol/L — ABNORMAL LOW (ref 135–145)
Total Bilirubin: 0.9 mg/dL (ref 0.3–1.2)
Total Protein: 7.5 g/dL (ref 6.5–8.1)

## 2021-02-03 LAB — ECHOCARDIOGRAM COMPLETE
AR max vel: 1.64 cm2
AV Area VTI: 1.74 cm2
AV Area mean vel: 1.55 cm2
AV Mean grad: 4 mmHg
AV Peak grad: 9.5 mmHg
Ao pk vel: 1.54 m/s
Area-P 1/2: 4.41 cm2
Calc EF: 34.8 %
MV M vel: 4.1 m/s
MV Peak grad: 67.2 mmHg
MV VTI: 1.48 cm2
P 1/2 time: 577 msec
S' Lateral: 5.8 cm
Single Plane A2C EF: 31.8 %
Single Plane A4C EF: 33.9 %

## 2021-02-03 LAB — BRAIN NATRIURETIC PEPTIDE: B Natriuretic Peptide: 303.9 pg/mL — ABNORMAL HIGH (ref 0.0–100.0)

## 2021-02-03 NOTE — Progress Notes (Addendum)
Advanced Heart Failure Clinic Note   Referring Physician: PCP: Michell Heinrich, DO PCP-Cardiologist: Dr. Haroldine Laws   HPI:   Taylor [name] is a 70 y.o. male with mechnical AVR on  chronic coumadin, h/o VF arrest s/p ICD, CAD s/p CABG, chronic systolic HF (EF 0000000), AAA and h/o essential thrombosis.    Has h/o congenital AoV abnormality with AI. In 1994 had MI and underwent PCI (unclear artery) and mechanical AVR at [facility].    Has h/o polysubstance abuse with cocaine, ETOH and tobacco abuse. No longer doing cocaine or smoking. Now drinks about 2 beers per day.   Did well from heart perspective until 2017, when he had VF arrest on 05/18/16. Found to have 3v CAD and MV disease and underwent CABG x 3 with re-do mechanical AVR. Post-operatively had AF (treated with amio) and CHB requiring implantation of a BosSci CRT-D device. This is followed by Dr. Caryl Comes. Coumadin Clinic at [facility] street follows INR. INR goal 2.0-3.0.    Echo on 12/18 showed LVEF of 25-30% stable AVR   Had CT on 12/18 for his back. Showed 3.7cm AAA. Had f/u US 05/2018 that showed mild increase in size of the known distal fusiform abdominal aortic aneurysm, measuring 4.2 cm in maximum diameter. He was referred to [facility] and he is now followed by Dr. Oneida Alar.     Most recent echo 02/2019 showed EF 20-25%. RV normal. A  He was admitted to [facility] on 1/21 w/ acute cholecystitis and sepsis. Hypotensive on admit w/ AKI and HF meds held. SCr bumped to 1.7 (baseline 1.1). Treated w/ IVFs and abx. Blood cultures negative. General surgery consulted and plans were for percutaneous cholecystostomy w/ drain, however HIDA scan was negative and he improved symptomatically w/ conservative therapy.   He presents today for routine f/u. Says he changed his diet earlier in the years and now has so much more energy. Feels great. Denies, CP, SOB, orthopnea or PND.   Echo today 02/03/21: EF 25-30% Personally reviewed AVR looks good.     Past Medical  History:  Diagnosis Date   Atherosclerotic heart disease of native coronary artery without angina pectoris    Chronic anticoagulation [id number]   Chronic systolic heart failure ([place]) 02/02/2017   Essential thrombocythemia ([place]) 02/15/2017   Hyperlipidemia    Hypertension    Ischemic cardiomyopathy    Leg pain, central, left 04/17/2017   Hx sciatica - pain similar; no red flag signs on hx or exam    Presence of other heart-valve replacement    Ventricular tachycardia (HCC)     Current Outpatient Medications  Medication Sig Dispense Refill   amiodarone (PACERONE) 200 MG tablet Take 0.5 tablets (100 mg total) by mouth daily. 45 tablet 3   aspirin 81 MG tablet Take 81 mg by mouth daily.     atorvastatin (LIPITOR) 40 MG tablet TAKE 1 TABLET(40 MG) BY MOUTH AT BEDTIME 90 tablet 1   carvedilol (COREG) 3.125 MG tablet TAKE 1 TABLET(3.125 MG) BY MOUTH TWICE DAILY WITH A MEAL 180 tablet 3   dapagliflozin propanediol (FARXIGA) 10 MG TABS tablet Take 1 tablet (10 mg total) by mouth daily. 90 tablet 3   hydroxyurea (HYDREA) 500 MG capsule TAKE 1 CAPSULE BY MOUTH ALTERNATING WITH 2 CAPSULES DAILY(500 MG ONE DAY AND 1000 MG NEXT DAY). 135 capsule 3   levothyroxine (SYNTHROID) 75 MCG tablet Take 75 mcg by mouth daily before breakfast.     sacubitril-valsartan (ENTRESTO) 24-26 MG Take 1 tablet by mouth daily.  sildenafil (REVATIO) 20 MG tablet Take 20 mg by mouth as needed.     spironolactone (ALDACTONE) 25 MG tablet TAKE 1/2 TABLET(12.5 MG) BY MOUTH DAILY 45 tablet 1   warfarin (COUMADIN) 5 MG tablet Take 1/2 tablet daily except 1 tablet on Mondays and Thursdays or as directed (Patient taking differently: Take 2.5-5 mg by mouth See admin instructions. 5 mg on Mondays Thurs and Sat , 2.5 mg all other days) 30 tablet 4   No current facility-administered medications for this encounter.    Allergies  Allergen Reactions   Erythromycin Rash   Atorvastatin Calcium Other (See Comments)   Rosuvastatin  Calcium     Unknown   Simvastatin     Unknown   Zithromax [Azithromycin] Rash      Social History   Socioeconomic History   Marital status: Divorced    Spouse name: Not on file   Number of children: Not on file   Years of education: Not on file   Highest education level: Not on file  Occupational History   Not on file  Tobacco Use   Smoking status: Former    Packs/day: 1.00    Years: 30.00    Pack years: 30.00    Types: Cigarettes    Start date: 05/26/1979    Quit date: 05/18/2016    Years since quitting: 4.7   Smokeless tobacco: Never  Vaping Use   Vaping Use: Never used  Substance and Sexual Activity   Alcohol use: Yes    Alcohol/week: 10.0 standard drinks    Types: 10 Cans of beer per week    Comment: 1 can a day..   Drug use: No   Sexual activity: Not on file  Other Topics Concern   Not on file  Social History Narrative   Not on file   Social Determinants of Health   Financial Resource Strain: Not on file  Food Insecurity: Not on file  Transportation Needs: Not on file  Physical Activity: Not on file  Stress: Not on file  Social Connections: Not on file  Intimate Partner Violence: Not on file      Family History  Problem Relation Age of Onset   Hypertension Father     Vitals:   02/03/21 1224  BP: 140/70  Pulse: (!) 58  SpO2: 99%  Weight: 71.9 kg (158 lb 9.6 oz)      PHYSICAL EXAM: General:  Well appearing. No resp difficulty HEENT: normal Neck: supple. no JVD. Carotids 2+ bilat; no bruits. No lymphadenopathy or thryomegaly appreciated. Cor: PMI nondisplaced. Regular rate & rhythm. Mechanical s2 (crisp) Lungs: clear Abdomen: soft, nontender, nondistended. No hepatosplenomegaly. No bruits or masses. Good bowel sounds. Extremities: no cyanosis, clubbing, rash, edema Neuro: alert & orientedx3, cranial nerves grossly intact. moves all 4 extremities w/o difficulty. Affect pleasant    ASSESSMENT & PLAN:  1. Chronic Systolic Heart  Failure: -Echo 05/2016 @ [facility], EF 40% -Echo 04/2017 EF 25-30% -Echo 04/2019 EF 20-25%. Diffuse hypokinesis, No LVH. RV normal. Mild AI, trace MR - s/p CRT-D.  - Much improved NYHA II with change in diet  - Echo today with stable EF 25-30% - Continue Entresto 24-26 (takes once daily, unable to tolerate BID dosing due to dizziness - has tried multiple times). Does not want to try again  - Continue Farxiga 10 mg daily - Continue Coreg 3.125 mg bid.  - Continue spiro 12.5 daily  - ICD interrogated in clinic. No VT/AF. Personally reviewed  2. Cholelithiasis: previous  admission for acute cholecystitis/ sepsis. HIDA scan negative. Improved w/ conservative therapy w/ abx and bowel rest.  - has seen [facility]. Conservative therapy for now - no change for now  3. CAD: - h/o remote PCI in 1994 at [facility] - s/p LHC in 2017 in the setting of Vfib Arrest w/ 3V CAD treated w/ CABG x 3 - no s/s angina - continue ASA, statin and ? blocker. - check lipids today. Goal LDL < 70   4. Mechanical Aortic Valve: - stable on echo 10/21 - on coumadin. INRs followed by [facility] - no bleeding - reminded of SBE prophylaxis (has dentures)  5. H/o VF Arrest and H/o CHB: - s/p BosSci CRT-D device, followed by EP (Dr. Caryl Comes) - on po amiodarone '100mg'$  daily.  - VT quiet on interrogation as above  6. AAA:  - CT scan 1/21, now measuring at 4.6 x 4.5 cm (nonruptured) - followed by [facility], Saw Dr. Oneida Alar in 1/22 and AAA stable at 4.7  - BP and HR controlled w/ ? blocker - on statin therapy   Glori Bickers, MD 02/03/21

## 2021-02-03 NOTE — Progress Notes (Signed)
*  PRELIMINARY RESULTS* Echocardiogram 2D Echocardiogram has been performed.  Luisa Hart RDCS 02/03/2021, 10:40 AM

## 2021-02-03 NOTE — Patient Instructions (Signed)
Labs done today, your results will be available in MyChart, we will contact you for abnormal readings.  Please call our office in February 2023 to schedule your follow up appointment  If you have any questions or concerns before your next appointment please send [place] a message through [place] or call our office at [phone].    TO LEAVE A MESSAGE FOR THE NURSE SELECT OPTION 2, PLEASE LEAVE A MESSAGE INCLUDING: YOUR NAME DATE OF BIRTH CALL BACK NUMBER REASON FOR CALL**this is important as we prioritize the call backs  YOU WILL RECEIVE A CALL BACK THE SAME DAY AS LONG AS YOU CALL BEFORE 4:00 PM  At the [facility], you and your health needs are our priority. As part of our continuing mission to provide you with exceptional heart care, we have created designated Provider Care Teams. These Care Teams include your primary Cardiologist (physician) and Advanced Practice Providers (APPs- Physician Assistants and Nurse Practitioners) who all work together to provide you with the care you need, when you need it.   You may see any of the following providers on your designated Care Team at your next follow up: Dr Glori Bickers Dr Loralie Champagne Dr Patrice Paradise, NP Lyda Jester, [place] Ginnie Smart Audry Riles, PharmD   Please be sure to bring in all your medications bottles to every appointment.

## 2021-02-04 ENCOUNTER — Ambulatory Visit: Payer: Medicare Other | Admitting: *Deleted

## 2021-02-04 DIAGNOSIS — Z5181 Encounter for therapeutic drug level monitoring: Secondary | ICD-10-CM

## 2021-02-04 DIAGNOSIS — Z952 Presence of prosthetic heart valve: Secondary | ICD-10-CM

## 2021-02-04 LAB — POCT INR: INR: 2.3 (ref 2.0–3.0)

## 2021-02-04 NOTE — Patient Instructions (Signed)
Continue warfarin 1 tablet Sundays, Tuesdays and Thursdays and 1/2 tablet all other days Recheck in 6 wk Coumadin Clinic [phone]

## 2021-02-11 NOTE — Progress Notes (Signed)
Remote ICD transmission.   

## 2021-03-17 ENCOUNTER — Ambulatory Visit: Payer: Medicare Other | Admitting: *Deleted

## 2021-03-17 DIAGNOSIS — Z5181 Encounter for therapeutic drug level monitoring: Secondary | ICD-10-CM | POA: Diagnosis not present

## 2021-03-17 DIAGNOSIS — Z952 Presence of prosthetic heart valve: Secondary | ICD-10-CM

## 2021-03-17 LAB — POCT INR: INR: 1.7 — AB (ref 2.0–3.0)

## 2021-03-17 NOTE — Patient Instructions (Signed)
Take warfarin 1 1/2 tablets tonight then increase dose to 1 tablet daily except 1/2 tablet on Mondays, Wednesdays and Fridays Recheck in 3 wk Coumadin Clinic [phone]

## 2021-04-07 ENCOUNTER — Ambulatory Visit: Payer: Medicare Other | Admitting: *Deleted

## 2021-04-07 ENCOUNTER — Other Ambulatory Visit: Payer: Self-pay

## 2021-04-07 DIAGNOSIS — Z952 Presence of prosthetic heart valve: Secondary | ICD-10-CM | POA: Diagnosis not present

## 2021-04-07 DIAGNOSIS — I4819 Other persistent atrial fibrillation: Secondary | ICD-10-CM

## 2021-04-07 DIAGNOSIS — Z5181 Encounter for therapeutic drug level monitoring: Secondary | ICD-10-CM | POA: Diagnosis not present

## 2021-04-07 LAB — POCT INR: INR: 2.1 (ref 2.0–3.0)

## 2021-04-07 NOTE — Patient Instructions (Signed)
Continue warfarin 1 tablet daily except 1/2 tablet on Mondays, Wednesdays and Fridays Recheck in 4 wk Coumadin Clinic [phone]

## 2021-04-20 ENCOUNTER — Other Ambulatory Visit (HOSPITAL_COMMUNITY): Payer: Self-pay | Admitting: *Deleted

## 2021-04-20 DIAGNOSIS — Z952 Presence of prosthetic heart valve: Secondary | ICD-10-CM

## 2021-04-20 MED ORDER — WARFARIN SODIUM 5 MG PO TABS
ORAL_TABLET | ORAL | 3 refills | Status: DC
Start: 2021-04-20 — End: 2021-05-13

## 2021-05-05 ENCOUNTER — Other Ambulatory Visit: Payer: Self-pay

## 2021-05-05 ENCOUNTER — Ambulatory Visit (INDEPENDENT_AMBULATORY_CARE_PROVIDER_SITE_OTHER): Payer: Medicare Other | Admitting: *Deleted

## 2021-05-05 ENCOUNTER — Ambulatory Visit (INDEPENDENT_AMBULATORY_CARE_PROVIDER_SITE_OTHER): Payer: Medicare Other

## 2021-05-05 DIAGNOSIS — I255 Ischemic cardiomyopathy: Secondary | ICD-10-CM | POA: Diagnosis not present

## 2021-05-05 DIAGNOSIS — Z5181 Encounter for therapeutic drug level monitoring: Secondary | ICD-10-CM | POA: Diagnosis not present

## 2021-05-05 DIAGNOSIS — Z952 Presence of prosthetic heart valve: Secondary | ICD-10-CM | POA: Diagnosis not present

## 2021-05-05 LAB — CUP PACEART REMOTE DEVICE CHECK
Battery Remaining Longevity: 78 mo
Battery Remaining Percentage: 100 %
Brady Statistic RA Percent Paced: 1 %
Brady Statistic RV Percent Paced: 86 %
Date Time Interrogation Session: 20221208004100
HighPow Impedance: 72 Ohm
Implantable Lead Implant Date: 20180105
Implantable Lead Implant Date: 20180105
Implantable Lead Implant Date: 20180105
Implantable Lead Location: 753858
Implantable Lead Location: 753859
Implantable Lead Location: 753860
Implantable Lead Model: 292
Implantable Lead Model: 4672
Implantable Lead Model: 7741
Implantable Lead Serial Number: 428973
Implantable Lead Serial Number: 801088
Implantable Lead Serial Number: 849519
Implantable Pulse Generator Implant Date: 20180105
Lead Channel Impedance Value: 647 Ohm
Lead Channel Impedance Value: 698 Ohm
Lead Channel Impedance Value: 721 Ohm
Lead Channel Pacing Threshold Amplitude: 0.7 V
Lead Channel Pacing Threshold Amplitude: 0.8 V
Lead Channel Pacing Threshold Amplitude: 1 V
Lead Channel Pacing Threshold Pulse Width: 0.4 ms
Lead Channel Pacing Threshold Pulse Width: 0.4 ms
Lead Channel Pacing Threshold Pulse Width: 1 ms
Lead Channel Setting Pacing Amplitude: 2 V
Lead Channel Setting Pacing Amplitude: 2 V
Lead Channel Setting Pacing Amplitude: 2.2 V
Lead Channel Setting Pacing Pulse Width: 0.4 ms
Lead Channel Setting Pacing Pulse Width: 1 ms
Lead Channel Setting Sensing Sensitivity: 0.6 mV
Lead Channel Setting Sensing Sensitivity: 1 mV
Pulse Gen Serial Number: 180139

## 2021-05-05 LAB — POCT INR: INR: 3.6 — AB (ref 2.0–3.0)

## 2021-05-05 NOTE — Patient Instructions (Signed)
Hold warfarin tonight Continue warfarin 1 tablet daily except 1/2 tablet on Mondays, Wednesdays and Fridays Recheck in 3 wk Coumadin Clinic [phone]

## 2021-05-06 ENCOUNTER — Other Ambulatory Visit (HOSPITAL_COMMUNITY): Payer: Self-pay

## 2021-05-06 ENCOUNTER — Other Ambulatory Visit (HOSPITAL_COMMUNITY): Payer: Self-pay | Admitting: *Deleted

## 2021-05-06 ENCOUNTER — Telehealth (HOSPITAL_COMMUNITY): Payer: Self-pay | Admitting: Pharmacy Technician

## 2021-05-06 MED ORDER — ENTRESTO 24-26 MG PO TABS: 1.0000 | ORAL_TABLET | Freq: Every day | ORAL | 3 refills | Status: DC

## 2021-05-06 NOTE — Telephone Encounter (Signed)
Advanced Heart Failure Patient Advocate Encounter  Spoke to patient regarding re-enrollment of Entresto assistance with [employer]. Patient dropped application off.    Will fax in once all signatures are obtained.   Of note, new RX sent to [employer] for last refill.

## 2021-05-06 NOTE — Addendum Note (Signed)
Addended by: Scarlette Calico on: 05/06/2021 10:28 AM   Modules accepted: Orders

## 2021-05-09 ENCOUNTER — Other Ambulatory Visit (HOSPITAL_COMMUNITY): Payer: Self-pay | Admitting: Internal Medicine

## 2021-05-10 ENCOUNTER — Other Ambulatory Visit: Payer: Self-pay

## 2021-05-10 NOTE — Telephone Encounter (Signed)
This is a CHF pt 

## 2021-05-11 MED ORDER — HYDROXYUREA 500 MG PO CAPS: ORAL_CAPSULE | ORAL | 3 refills | Status: DC

## 2021-05-12 ENCOUNTER — Other Ambulatory Visit (HOSPITAL_COMMUNITY): Payer: Self-pay

## 2021-05-12 MED ORDER — ENTRESTO 24-26 MG PO TABS: 1.0000 | ORAL_TABLET | Freq: Every day | ORAL | 0 refills | Status: DC

## 2021-05-13 ENCOUNTER — Other Ambulatory Visit (HOSPITAL_COMMUNITY): Payer: Self-pay | Admitting: *Deleted

## 2021-05-13 DIAGNOSIS — Z952 Presence of prosthetic heart valve: Secondary | ICD-10-CM

## 2021-05-13 MED ORDER — WARFARIN SODIUM 5 MG PO TABS
ORAL_TABLET | ORAL | 3 refills | Status: DC
Start: 2021-05-13 — End: 2021-09-22

## 2021-05-13 NOTE — Progress Notes (Signed)
Remote ICD transmission.   

## 2021-05-17 NOTE — Telephone Encounter (Signed)
Sent in application via fax.  Will follow up.  

## 2021-05-26 ENCOUNTER — Other Ambulatory Visit: Payer: Self-pay

## 2021-05-26 ENCOUNTER — Ambulatory Visit: Payer: Medicare Other | Admitting: *Deleted

## 2021-05-26 DIAGNOSIS — Z5181 Encounter for therapeutic drug level monitoring: Secondary | ICD-10-CM | POA: Diagnosis not present

## 2021-05-26 DIAGNOSIS — Z952 Presence of prosthetic heart valve: Secondary | ICD-10-CM

## 2021-05-26 LAB — POCT INR: INR: 2 (ref 2.0–3.0)

## 2021-05-26 NOTE — Patient Instructions (Signed)
Continue warfarin 1 tablet daily except 1/2 tablet on Mondays, Wednesdays and Fridays Recheck in 4 wk Coumadin Clinic [phone]

## 2021-06-23 ENCOUNTER — Ambulatory Visit: Payer: Medicare Other | Admitting: *Deleted

## 2021-06-23 DIAGNOSIS — Z952 Presence of prosthetic heart valve: Secondary | ICD-10-CM | POA: Diagnosis not present

## 2021-06-23 DIAGNOSIS — Z5181 Encounter for therapeutic drug level monitoring: Secondary | ICD-10-CM

## 2021-06-23 LAB — POCT INR: INR: 1.6 — AB (ref 2.0–3.0)

## 2021-06-23 NOTE — Patient Instructions (Signed)
Take warfarin 1 1/2 tablets today, 1 tablet tomorrow then resume 1 tablet daily except 1/2 tablet on Mondays, Wednesdays and Fridays Recheck in 3 wks Coumadin Clinic [phone]

## 2021-07-06 ENCOUNTER — Other Ambulatory Visit (HOSPITAL_COMMUNITY): Payer: Self-pay

## 2021-07-06 MED ORDER — DAPAGLIFLOZIN PROPANEDIOL 10 MG PO TABS: 10.0000 mg | ORAL_TABLET | Freq: Every day | ORAL | 3 refills | Status: DC

## 2021-07-14 ENCOUNTER — Other Ambulatory Visit: Payer: Self-pay

## 2021-07-14 ENCOUNTER — Ambulatory Visit: Payer: Medicare Other | Admitting: *Deleted

## 2021-07-14 DIAGNOSIS — Z952 Presence of prosthetic heart valve: Secondary | ICD-10-CM

## 2021-07-14 DIAGNOSIS — Z5181 Encounter for therapeutic drug level monitoring: Secondary | ICD-10-CM | POA: Diagnosis not present

## 2021-07-14 LAB — POCT INR: INR: 2.3 (ref 2.0–3.0)

## 2021-07-14 NOTE — Patient Instructions (Signed)
Continue warfarin 1 tablet daily except 1/2 tablet on Mondays, Wednesdays and Fridays Recheck in 4 wks Coumadin Clinic [phone]

## 2021-07-21 ENCOUNTER — Other Ambulatory Visit (HOSPITAL_COMMUNITY): Payer: Self-pay | Admitting: Cardiology

## 2021-07-21 MED ORDER — ATORVASTATIN CALCIUM 40 MG PO TABS: ORAL_TABLET | ORAL | 0 refills | Status: DC

## 2021-07-22 NOTE — Telephone Encounter (Signed)
Called [employer] to check the status of the patient's application. Representative stated that they did not receive the patient's application, despite successful confirmation. In lieu of continuing to apply for assistance, he was approved for a [employer]. Total amount awarded, $10,000.  ID [id number] BIN [id number] PCN [id number] [id number] 06/22/21-06/21/22

## 2021-07-25 NOTE — Telephone Encounter (Signed)
Advanced Heart Failure Patient Advocate Encounter  Called and spoke with the patient. Sent copy of grant information. Sent 90 day RX request to [facility] to send to [facility] with the attached grant information. Advised him to call with any issues.  Charlann Boxer, CPhT

## 2021-07-26 ENCOUNTER — Other Ambulatory Visit (HOSPITAL_COMMUNITY): Payer: Self-pay | Admitting: *Deleted

## 2021-07-26 MED ORDER — ENTRESTO 24-26 MG PO TABS: 1.0000 | ORAL_TABLET | Freq: Every day | ORAL | 3 refills | Status: DC

## 2021-08-02 ENCOUNTER — Other Ambulatory Visit (HOSPITAL_COMMUNITY): Payer: Self-pay | Admitting: *Deleted

## 2021-08-02 MED ORDER — DAPAGLIFLOZIN PROPANEDIOL 10 MG PO TABS: 10.0000 mg | ORAL_TABLET | Freq: Every day | ORAL | 3 refills | Status: DC

## 2021-08-04 ENCOUNTER — Ambulatory Visit (INDEPENDENT_AMBULATORY_CARE_PROVIDER_SITE_OTHER): Payer: Medicare Other

## 2021-08-04 DIAGNOSIS — I255 Ischemic cardiomyopathy: Secondary | ICD-10-CM | POA: Diagnosis not present

## 2021-08-04 LAB — CUP PACEART REMOTE DEVICE CHECK
Battery Remaining Longevity: 72 mo
Battery Remaining Percentage: 96 %
Brady Statistic RA Percent Paced: 1 %
Brady Statistic RV Percent Paced: 87 %
Date Time Interrogation Session: 20230309004100
HighPow Impedance: 67 Ohm
Implantable Lead Implant Date: 20180105
Implantable Lead Implant Date: 20180105
Implantable Lead Implant Date: 20180105
Implantable Lead Location: 753858
Implantable Lead Location: 753859
Implantable Lead Location: 753860
Implantable Lead Model: 292
Implantable Lead Model: 4672
Implantable Lead Model: 7741
Implantable Lead Serial Number: 428973
Implantable Lead Serial Number: 801088
Implantable Lead Serial Number: 849519
Implantable Pulse Generator Implant Date: 20180105
Lead Channel Impedance Value: 626 Ohm
Lead Channel Impedance Value: 747 Ohm
Lead Channel Impedance Value: 784 Ohm
Lead Channel Pacing Threshold Amplitude: 0.7 V
Lead Channel Pacing Threshold Amplitude: 0.8 V
Lead Channel Pacing Threshold Amplitude: 1 V
Lead Channel Pacing Threshold Pulse Width: 0.4 ms
Lead Channel Pacing Threshold Pulse Width: 0.4 ms
Lead Channel Pacing Threshold Pulse Width: 1 ms
Lead Channel Setting Pacing Amplitude: 2 V
Lead Channel Setting Pacing Amplitude: 2 V
Lead Channel Setting Pacing Amplitude: 2.2 V
Lead Channel Setting Pacing Pulse Width: 0.4 ms
Lead Channel Setting Pacing Pulse Width: 1 ms
Lead Channel Setting Sensing Sensitivity: 0.6 mV
Lead Channel Setting Sensing Sensitivity: 1 mV
Pulse Gen Serial Number: 180139

## 2021-08-11 ENCOUNTER — Other Ambulatory Visit (HOSPITAL_COMMUNITY): Payer: Self-pay | Admitting: Cardiology

## 2021-08-11 ENCOUNTER — Ambulatory Visit: Payer: Medicare Other | Admitting: *Deleted

## 2021-08-11 DIAGNOSIS — Z5181 Encounter for therapeutic drug level monitoring: Secondary | ICD-10-CM | POA: Diagnosis not present

## 2021-08-11 DIAGNOSIS — Z952 Presence of prosthetic heart valve: Secondary | ICD-10-CM | POA: Diagnosis not present

## 2021-08-11 LAB — POCT INR: INR: 2.4 (ref 2.0–3.0)

## 2021-08-11 NOTE — Patient Instructions (Signed)
Continue warfarin 1 tablet daily except 1/2 tablet on Mondays, Wednesdays and Fridays ?Recheck in 4 wks ?Coumadin Clinic [phone] ?

## 2021-08-17 ENCOUNTER — Telehealth (HOSPITAL_COMMUNITY): Payer: Self-pay | Admitting: *Deleted

## 2021-08-17 MED ORDER — AMOXICILLIN 500 MG PO CAPS: ORAL_CAPSULE | ORAL | 0 refills | Status: DC

## 2021-08-17 NOTE — Telephone Encounter (Signed)
Pt asked if he needed an antibiotic before having dental procedures. Pt has a scheduled dental appointment next week.  ? ?Routed to [place] for advice ?

## 2021-08-17 NOTE — Progress Notes (Signed)
Remote ICD transmission.   

## 2021-08-18 NOTE — Telephone Encounter (Signed)
Antibiotic sent

## 2021-09-01 ENCOUNTER — Telehealth: Payer: Self-pay

## 2021-09-01 NOTE — Telephone Encounter (Signed)
Transmission reviewed. Patient remains in AFL. Successful telephone call to patient to advise scheduling would be calling to schedule appointment with Dr. Caryl Comes next week to disucss ongoing AFL. Denies symptoms. Patient appreciative of call.  ? ? ? ?

## 2021-09-01 NOTE — Telephone Encounter (Signed)
I spoke with the patient and he agreed to send a manual transmission with his monitor this afternoon. ?

## 2021-09-01 NOTE — Telephone Encounter (Signed)
-----   Message from Deboraha Sprang, MD sent at 08/31/2021  9:04 PM EDT ----- ?Remote reviewed. ?This remote is abnormal for persistent atrial flutter  please have him transmit again and if he is still in flutter schedule him to come see me next tues or thurs ?Thanks SK  ?  ? ?

## 2021-09-02 NOTE — Progress Notes (Signed)
Scheduled

## 2021-09-08 ENCOUNTER — Ambulatory Visit: Payer: Medicare Other | Admitting: *Deleted

## 2021-09-08 DIAGNOSIS — Z5181 Encounter for therapeutic drug level monitoring: Secondary | ICD-10-CM

## 2021-09-08 DIAGNOSIS — Z952 Presence of prosthetic heart valve: Secondary | ICD-10-CM | POA: Diagnosis not present

## 2021-09-08 LAB — POCT INR: INR: 2.3 (ref 2.0–3.0)

## 2021-09-08 NOTE — Patient Instructions (Signed)
Continue warfarin 1 tablet daily except 1/2 tablet on Mondays, Wednesdays and Fridays ?Recheck in 6 wks ?Coumadin Clinic [phone] ?

## 2021-09-09 ENCOUNTER — Encounter: Payer: Self-pay | Admitting: Internal Medicine

## 2021-09-09 ENCOUNTER — Ambulatory Visit: Payer: Medicare Other | Admitting: Internal Medicine

## 2021-09-09 VITALS — BP 104/60 | HR 72 | Ht 69.0 in | Wt 164.0 lb

## 2021-09-09 DIAGNOSIS — I4901 Ventricular fibrillation: Secondary | ICD-10-CM | POA: Diagnosis not present

## 2021-09-09 DIAGNOSIS — R718 Other abnormality of red blood cells: Secondary | ICD-10-CM | POA: Diagnosis not present

## 2021-09-09 DIAGNOSIS — I255 Ischemic cardiomyopathy: Secondary | ICD-10-CM | POA: Diagnosis not present

## 2021-09-09 DIAGNOSIS — Z95810 Presence of automatic (implantable) cardiac defibrillator: Secondary | ICD-10-CM | POA: Diagnosis not present

## 2021-09-09 DIAGNOSIS — E032 Hypothyroidism due to medicaments and other exogenous substances: Secondary | ICD-10-CM

## 2021-09-09 DIAGNOSIS — Z79899 Other long term (current) drug therapy: Secondary | ICD-10-CM

## 2021-09-09 DIAGNOSIS — I4892 Unspecified atrial flutter: Secondary | ICD-10-CM

## 2021-09-09 NOTE — Progress Notes (Signed)
? ? ? ? ?Patient Care Team: ?Michell Heinrich, DO as PCP - General (Family Medicine) ?Annia Belt, MD as Consulting Physician (Oncology) ?Bensimhon, Shaune Pascal, MD as Consulting Physician (Cardiology) ? ? ?HPI ? ?Taylor [name] is a 71 y.o. male seen in follow-up for VF arrest 12/17.  He has a history of a prior mechanical aortic valve replacement.  Following his arrest he underwent CABG and mechanical aortic valve replacement. ?Not unsurprisingly he ended up with postoperative CHB and underwent CRT-D implantation. ?  ?He has a history of congenital aortic malformation with aortic insufficiency.  History of ischemic cardiomyopathy with prior MI and prior PCI. ? ?Atrial fibrillation identified the onset of mid February 2023 ? ?Prior history of polysubstance abuse.  Still drinking 2 beers and 1 glass of wine a day.  Has gone on a cardiac vegan diet and has lost 25 pounds. ? ?Was in [place] notice of shortness of breath.  Device demonstrated atrial flutter onset ? ?On amiodarone for freq PVCs started at [facility]. ? ?Patient denies symptoms of respiratory, GI intolerance, sun sensitivity, neurological symptoms attributable to amiodarone.   ?  ?DATE TEST  EF    ?1/18    Echo  40 %    ? 12/18 Echo  25-30%     ? 9/22 Echo  20-25%     ?  ?  ?Date Cr K Hgb TSH LFTs  ?1/20 1.19 3.8 13.1 21.5 23  ?2/21  1.09 3.7   110(1/21)  ?1/22    5.002   ?9/22 0.99 1.9 15.6 ([id number])      ? ?  ? ?Records and Results Reviewed  ? ?Past Medical History:  ?Diagnosis Date  ? Atherosclerotic heart disease of native coronary artery without angina pectoris   ? Chronic anticoagulation 02/18/2018  ? Chronic systolic heart failure ([place]) 02/02/2017  ? Essential thrombocythemia ([place]) 02/15/2017  ? Hyperlipidemia   ? Hypertension   ? Ischemic cardiomyopathy   ? Leg pain, central, left 04/17/2017  ? Hx sciatica - pain similar; no red flag signs on hx or exam   ? Presence of other heart-valve replacement   ? Ventricular tachycardia ([place])   ? ? ?Past Surgical  History:  ?Procedure Laterality Date  ? BIV ICD INSERTION CRT-D    ? CARDIOVERSION N/A 10/21/2020  ? Procedure: CARDIOVERSION;  Surgeon: Lelon Perla, MD;  [facility] ENDOSCOPY;  Service: Cardiovascular;  Laterality: N/A;  ? CORONARY ARTERY BYPASS GRAFT  2017  ? MECHANICAL AORTIC VALVE REPLACEMENT    ? TEE WITHOUT CARDIOVERSION N/A 10/21/2020  ? Procedure: TRANSESOPHAGEAL ECHOCARDIOGRAM (TEE);  Surgeon: Lelon Perla, MD;  Location: [facility] ENDOSCOPY;  Service: Cardiovascular;  Laterality: N/A;  ? ? ?Current Meds  ?Medication Sig  ? amiodarone (PACERONE) 200 MG tablet TAKE 1/2 TABLET(100 MG) BY MOUTH DAILY  ? amoxicillin (AMOXIL) 500 MG capsule Take 4 capsules (2.'000mg'$ ) one hour before dental procedures.  ? aspirin 81 MG tablet Take 81 mg by mouth daily.  ? atorvastatin (LIPITOR) 40 MG tablet TAKE 1 TABLET(40 MG) BY MOUTH AT BEDTIME  ? carvedilol (COREG) 3.125 MG tablet TAKE 1 TABLET(3.125 MG) BY MOUTH TWICE DAILY WITH A MEAL  ? dapagliflozin propanediol (FARXIGA) 10 MG TABS tablet Take 1 tablet (10 mg total) by mouth daily.  ? hydroxyurea (HYDREA) 500 MG capsule TAKE 1 CAPSULE BY MOUTH ALTERNATING WITH 2 CAPSULES DAILY(500 MG ONE DAY AND 1000 MG NEXT DAY).  ? levothyroxine (SYNTHROID) 75 MCG tablet Take 75 mcg by mouth daily  before breakfast.  ? sacubitril-valsartan (ENTRESTO) 24-26 MG Take 1 tablet by mouth daily at 6 (six) AM.  ? sildenafil (REVATIO) 20 MG tablet Take 20 mg by mouth as needed.  ? spironolactone (ALDACTONE) 25 MG tablet TAKE 1/2 TABLET(12.5 MG) BY MOUTH DAILY  ? warfarin (COUMADIN) 5 MG tablet Take 1/2 tablet daily except 1 tablet on Mondays and Thursdays or as directed  ? ? ?Allergies  ?Allergen Reactions  ? Erythromycin Rash  ? Atorvastatin Calcium Other (See Comments)  ? Rosuvastatin Calcium   ?  Unknown  ? Simvastatin   ?  Unknown  ? Zithromax [Azithromycin] Rash  ? ? ? ? ?Review of Systems negative except from HPI and PMH ?BP 104/60   Pulse 72   Ht '5\' 9"'$  (1.753 m)   Wt 164 lb (74.4 kg)    SpO2 97%   BMI 24.22 kg/m?  ? ?Well developed and well nourished in no acute distress ?HENT normal ?Neck supple with JVP-flat ?Clear ?Device pocket well healed; without hematoma or erythema.  There is no tethering  ?Regular rate and rhythm, mechanical S2 and a 2/6 systolic murmur  ?abd-soft with active BS ?No Clubbing cyanosis 1+ edema ?Skin-warm and dry ?A & Oriented  Grossly normal sensory and motor function ? ?ECG atrial flutter with a cycle length of 240 ms ? ?Efforts to pace terminate resulted in acceleration of the atrial flutter cycle length to about 180 ms. ? ?Assessment and  Plan ?Ischemic cardiomyopathy status post CABG ? ?Aborted cardiac arrest ? ?Valvular heart disease status post mechanical aortic/mitral valve replacement ? ?ICD-[employer]-CRT-D   ? ?PVCs ? ?Complete Heart Block ? ?Amiodarone therapy ? ?Hypothyroidism ? ?Hypertransaminasemia  ? ?Macrocytosis ? ?Alcohol abuse ?  ? ?The patient presents with atrial flutter.  He is anticoagulated with warfarin.  Efforts to pace terminate were unsuccessful.  It is associated with symptoms notable for shortness of breath with exertion.  We will undertake cardioversion. ? ?Again discussed the impact of alcohol as a cardiac poison.  He has significant macrocytosis suspect he will be either folate or B12 deficient. ? ?Remains on amiodarone, tolerating.  We will check amiodarone surveillance laboratories.  His elevated transaminases in the past are more likely related to alcohol ? ? ? ?Current medicines are reviewed at length with the patient today .  The patient does not  have concerns regarding medicines. ? ?

## 2021-09-09 NOTE — H&P (View-Only) (Signed)
? ? ? ? ?Patient Care Team: ?Michell Heinrich, DO as PCP - General (Family Medicine) ?Annia Belt, MD as Consulting Physician (Oncology) ?Bensimhon, Shaune Pascal, MD as Consulting Physician (Cardiology) ? ? ?HPI ? ?Taylor [name] is a 71 y.o. male seen in follow-up for VF arrest 12/17.  He has a history of a prior mechanical aortic valve replacement.  Following his arrest he underwent CABG and mechanical aortic valve replacement. ?Not unsurprisingly he ended up with postoperative CHB and underwent CRT-D implantation. ?  ?He has a history of congenital aortic malformation with aortic insufficiency.  History of ischemic cardiomyopathy with prior MI and prior PCI. ? ?Atrial fibrillation identified the onset of mid February 2023 ? ?Prior history of polysubstance abuse.  Still drinking 2 beers and 1 glass of wine a day.  Has gone on a cardiac vegan diet and has lost 25 pounds. ? ?Was in [place] notice of shortness of breath.  Device demonstrated atrial flutter onset ? ?On amiodarone for freq PVCs started at [facility]. ? ?Patient denies symptoms of respiratory, GI intolerance, sun sensitivity, neurological symptoms attributable to amiodarone.   ?  ?DATE TEST  EF    ?1/18    Echo  40 %    ? 12/18 Echo  25-30%     ? 9/22 Echo  20-25%     ?  ?  ?Date Cr K Hgb TSH LFTs  ?1/20 1.19 3.8 13.1 21.5 23  ?2/21  1.09 3.7   110(1/21)  ?1/22    5.002   ?9/22 0.99 1.9 15.6 (MCV115)      ? ?  ? ?Records and Results Reviewed  ? ?Past Medical History:  ?Diagnosis Date  ? Atherosclerotic heart disease of native coronary artery without angina pectoris   ? Chronic anticoagulation 02/18/2018  ? Chronic systolic heart failure ([place]) 02/02/2017  ? Essential thrombocythemia ([place]) 02/15/2017  ? Hyperlipidemia   ? Hypertension   ? Ischemic cardiomyopathy   ? Leg pain, central, left 04/17/2017  ? Hx sciatica - pain similar; no red flag signs on hx or exam   ? Presence of other heart-valve replacement   ? Ventricular tachycardia ([place])   ? ? ?Past Surgical  History:  ?Procedure Laterality Date  ? BIV ICD INSERTION CRT-D    ? CARDIOVERSION N/A 10/21/2020  ? Procedure: CARDIOVERSION;  Surgeon: Lelon Perla, MD;  Location: [facility] ENDOSCOPY;  Service: Cardiovascular;  Laterality: N/A;  ? CORONARY ARTERY BYPASS GRAFT  2017  ? MECHANICAL AORTIC VALVE REPLACEMENT    ? TEE WITHOUT CARDIOVERSION N/A 10/21/2020  ? Procedure: TRANSESOPHAGEAL ECHOCARDIOGRAM (TEE);  Surgeon: Lelon Perla, MD;  Location: [facility] ENDOSCOPY;  Service: Cardiovascular;  Laterality: N/A;  ? ? ?Current Meds  ?Medication Sig  ? amiodarone (PACERONE) 200 MG tablet TAKE 1/2 TABLET(100 MG) BY MOUTH DAILY  ? amoxicillin (AMOXIL) 500 MG capsule Take 4 capsules (2.'000mg'$ ) one hour before dental procedures.  ? aspirin 81 MG tablet Take 81 mg by mouth daily.  ? atorvastatin (LIPITOR) 40 MG tablet TAKE 1 TABLET(40 MG) BY MOUTH AT BEDTIME  ? carvedilol (COREG) 3.125 MG tablet TAKE 1 TABLET(3.125 MG) BY MOUTH TWICE DAILY WITH A MEAL  ? dapagliflozin propanediol (FARXIGA) 10 MG TABS tablet Take 1 tablet (10 mg total) by mouth daily.  ? hydroxyurea (HYDREA) 500 MG capsule TAKE 1 CAPSULE BY MOUTH ALTERNATING WITH 2 CAPSULES DAILY(500 MG ONE DAY AND 1000 MG NEXT DAY).  ? levothyroxine (SYNTHROID) 75 MCG tablet Take 75 mcg by mouth daily  before breakfast.  ? sacubitril-valsartan (ENTRESTO) 24-26 MG Take 1 tablet by mouth daily at 6 (six) AM.  ? sildenafil (REVATIO) 20 MG tablet Take 20 mg by mouth as needed.  ? spironolactone (ALDACTONE) 25 MG tablet TAKE 1/2 TABLET(12.5 MG) BY MOUTH DAILY  ? warfarin (COUMADIN) 5 MG tablet Take 1/2 tablet daily except 1 tablet on Mondays and Thursdays or as directed  ? ? ?Allergies  ?Allergen Reactions  ? Erythromycin Rash  ? Atorvastatin Calcium Other (See Comments)  ? Rosuvastatin Calcium   ?  Unknown  ? Simvastatin   ?  Unknown  ? Zithromax [Azithromycin] Rash  ? ? ? ? ?Review of Systems negative except from HPI and PMH ?BP 104/60   Pulse 72   Ht '5\' 9"'$  (1.753 m)   Wt 164 lb (74.4 kg)    SpO2 97%   BMI 24.22 kg/m?  ? ?Well developed and well nourished in no acute distress ?HENT normal ?Neck supple with JVP-flat ?Clear ?Device pocket well healed; without hematoma or erythema.  There is no tethering  ?Regular rate and rhythm, mechanical S2 and a 2/6 systolic murmur  ?abd-soft with active BS ?No Clubbing cyanosis 1+ edema ?Skin-warm and dry ?A & Oriented  Grossly normal sensory and motor function ? ?ECG atrial flutter with a cycle length of 240 ms ? ?Efforts to pace terminate resulted in acceleration of the atrial flutter cycle length to about 180 ms. ? ?Assessment and  Plan ?Ischemic cardiomyopathy status post CABG ? ?Aborted cardiac arrest ? ?Valvular heart disease status post mechanical aortic/mitral valve replacement ? ?ICD-[employer]-CRT-D   ? ?PVCs ? ?Complete Heart Block ? ?Amiodarone therapy ? ?Hypothyroidism ? ?Hypertransaminasemia  ? ?Macrocytosis ? ?Alcohol abuse ?  ? ?The patient presents with atrial flutter.  He is anticoagulated with warfarin.  Efforts to pace terminate were unsuccessful.  It is associated with symptoms notable for shortness of breath with exertion.  We will undertake cardioversion. ? ?Again discussed the impact of alcohol as a cardiac poison.  He has significant macrocytosis suspect he will be either folate or B12 deficient. ? ?Remains on amiodarone, tolerating.  We will check amiodarone surveillance laboratories.  His elevated transaminases in the past are more likely related to alcohol ? ? ? ?Current medicines are reviewed at length with the patient today .  The patient does not  have concerns regarding medicines. ? ?

## 2021-09-09 NOTE — Patient Instructions (Addendum)
Medication Instructions: . ?Your physician recommends that you continue on your current medications as directed. Please refer to the Current Medication list given to you today. ? ?*If you need a refill on your cardiac medications before your next appointment, please call your pharmacy* ? ? ?Lab Work: ?B12 and Folate panel, TSH and Liver panel by your PCP ? ?If you have labs (blood work) drawn today and your tests are completely normal, you will receive your results only by: ?MyChart Message (if you have MyChart) OR ?A paper copy in the mail ?If you have any lab test that is abnormal or we need to change your treatment, we will call you to review the results. ? ? ?Testing/Procedures: ?Your physician has recommended that you have a Cardioversion (DCCV). Electrical Cardioversion uses a jolt of electricity to your heart either through paddles or wired patches attached to your chest. This is a controlled, usually prescheduled, procedure. Defibrillation is done under light anesthesia in the hospital, and you usually go home the day of the procedure. This is done to get your heart back into a normal rhythm. You are not awake for the procedure. Please see the instruction sheet given to you today. ? ? ? ?Follow-Up: ?At [facility], you and your health needs are our priority.  As part of our continuing mission to provide you with exceptional heart care, we have created designated Provider Care Teams.  These Care Teams include your primary Cardiologist (physician) and Advanced Practice Providers (APPs -  Physician Assistants and Nurse Practitioners) who all work together to provide you with the care you need, when you need it. ? ?We recommend signing up for the patient portal called "MyChart".  Sign up information is provided on this After Visit Summary.  MyChart is used to connect with patients for Virtual Visits (Telemedicine).  Patients are able to view lab/test results, encounter notes, upcoming appointments, etc.   Non-urgent messages can be sent to your provider as well.   ?To learn more about what you can do with MyChart, go to [email or web address].   ? ?Your next appointment:   ?Follow up will be scheduled with Afib clinic.:1}  ? ? ?Other Instructions ? ?You are scheduled for a Cardioversion on Thursday 09/22/2021 with Dr. Radford Pax.  Please arrive at the [facility] (Main Entrance A) at [facility]: [address] [place], [place] [address] at [place].  ? ?DIET: Nothing to eat or drink after midnight except a sip of water with medications (see medication instructions below) ? ?FYI: For your safety, and to allow [place] to monitor your vital signs accurately during the surgery/procedure we request that   ?if you have artificial nails, gel coating, SNS etc. Please have those removed prior to your surgery/procedure. Not having the nail coverings /polish removed may result in cancellation or delay of your surgery/procedure. ? ? ?Medication Instructions: ?Hold Spironolactone the morning of your procedure.  You may take your other medications with enough water to get them down safely ? ?Continue your anticoagulant: Warfarin ?You will need to continue your anticoagulant after your procedure until you  are told by your Provider that it is safe to stop ? ? ?Labs: Your lab work will be done at the hospital prior to your procedure - you will need to arrive at [place]. ? ?You must have a responsible person to drive you home and stay in the waiting area during your procedure. Failure to do so could result in cancellation. ? ?Interior and spatial designer cards. ? ?*Special  Note: Every effort is made to have your procedure done on time. Occasionally there are emergencies that occur at the hospital that may cause delays. Please be patient if a delay does occur.   ? ?Important Information About Sugar ? ? ? ? ?  ?

## 2021-09-15 ENCOUNTER — Encounter (HOSPITAL_COMMUNITY): Payer: Self-pay | Admitting: Cardiology

## 2021-09-15 NOTE — Progress Notes (Signed)
Attempted to obtain medical history via telephone, unable to reach at this time. I left a voicemail to return pre surgical testing department's phone call.  

## 2021-09-22 ENCOUNTER — Encounter (HOSPITAL_COMMUNITY)
Admission: RE | Disposition: A | Discharge status: Home / Self Care | Payer: Self-pay | Source: Home / Self Care | Attending: Cardiology

## 2021-09-22 ENCOUNTER — Ambulatory Visit (HOSPITAL_COMMUNITY): Payer: Medicare Other | Admitting: Certified Registered Nurse Anesthetist

## 2021-09-22 ENCOUNTER — Ambulatory Visit (HOSPITAL_COMMUNITY)
Admission: RE | Admit: 2021-09-22 | Discharge: 2021-09-22 | Disposition: A | Discharge status: Home / Self Care | Payer: Medicare Other | Attending: Cardiology | Admitting: Cardiology

## 2021-09-22 ENCOUNTER — Ambulatory Visit (HOSPITAL_BASED_OUTPATIENT_CLINIC_OR_DEPARTMENT_OTHER): Payer: Medicare Other | Admitting: Certified Registered Nurse Anesthetist

## 2021-09-22 ENCOUNTER — Other Ambulatory Visit: Payer: Self-pay

## 2021-09-22 DIAGNOSIS — I255 Ischemic cardiomyopathy: Secondary | ICD-10-CM | POA: Insufficient documentation

## 2021-09-22 DIAGNOSIS — I110 Hypertensive heart disease with heart failure: Secondary | ICD-10-CM

## 2021-09-22 DIAGNOSIS — I4892 Unspecified atrial flutter: Secondary | ICD-10-CM

## 2021-09-22 DIAGNOSIS — I252 Old myocardial infarction: Secondary | ICD-10-CM | POA: Insufficient documentation

## 2021-09-22 DIAGNOSIS — Z7901 Long term (current) use of anticoagulants: Secondary | ICD-10-CM | POA: Insufficient documentation

## 2021-09-22 DIAGNOSIS — I493 Ventricular premature depolarization: Secondary | ICD-10-CM | POA: Insufficient documentation

## 2021-09-22 DIAGNOSIS — I442 Atrioventricular block, complete: Secondary | ICD-10-CM | POA: Insufficient documentation

## 2021-09-22 DIAGNOSIS — D7589 Other specified diseases of blood and blood-forming organs: Secondary | ICD-10-CM | POA: Diagnosis not present

## 2021-09-22 DIAGNOSIS — I5022 Chronic systolic (congestive) heart failure: Secondary | ICD-10-CM | POA: Diagnosis not present

## 2021-09-22 DIAGNOSIS — I2510 Atherosclerotic heart disease of native coronary artery without angina pectoris: Secondary | ICD-10-CM | POA: Insufficient documentation

## 2021-09-22 DIAGNOSIS — Z951 Presence of aortocoronary bypass graft: Secondary | ICD-10-CM | POA: Diagnosis not present

## 2021-09-22 DIAGNOSIS — Z9861 Coronary angioplasty status: Secondary | ICD-10-CM | POA: Insufficient documentation

## 2021-09-22 DIAGNOSIS — I509 Heart failure, unspecified: Secondary | ICD-10-CM | POA: Diagnosis not present

## 2021-09-22 DIAGNOSIS — E039 Hypothyroidism, unspecified: Secondary | ICD-10-CM | POA: Insufficient documentation

## 2021-09-22 DIAGNOSIS — R7401 Elevation of levels of liver transaminase levels: Secondary | ICD-10-CM | POA: Diagnosis not present

## 2021-09-22 DIAGNOSIS — Z8674 Personal history of sudden cardiac arrest: Secondary | ICD-10-CM | POA: Insufficient documentation

## 2021-09-22 DIAGNOSIS — Z952 Presence of prosthetic heart valve: Secondary | ICD-10-CM | POA: Insufficient documentation

## 2021-09-22 DIAGNOSIS — Z87891 Personal history of nicotine dependence: Secondary | ICD-10-CM

## 2021-09-22 DIAGNOSIS — Z79899 Other long term (current) drug therapy: Secondary | ICD-10-CM | POA: Insufficient documentation

## 2021-09-22 DIAGNOSIS — F1010 Alcohol abuse, uncomplicated: Secondary | ICD-10-CM | POA: Diagnosis not present

## 2021-09-22 HISTORY — PX: CARDIOVERSION: SHX1299

## 2021-09-22 LAB — POCT I-STAT, CHEM 8
BUN: 28 mg/dL — ABNORMAL HIGH (ref 8–23)
Calcium, Ion: 1.18 mmol/L (ref 1.15–1.40)
Chloride: 101 mmol/L (ref 98–111)
Creatinine, Ser: 2.9 mg/dL — ABNORMAL HIGH (ref 0.61–1.24)
Glucose, Bld: 102 mg/dL — ABNORMAL HIGH (ref 70–99)
HCT: 49 % (ref 39.0–52.0)
Hemoglobin: 16.7 g/dL (ref 13.0–17.0)
Potassium: 4.4 mmol/L (ref 3.5–5.1)
Sodium: 137 mmol/L (ref 135–145)
TCO2: 28 mmol/L (ref 22–32)

## 2021-09-22 LAB — COMPREHENSIVE METABOLIC PANEL
ALT: 18 U/L (ref 0–44)
AST: 27 U/L (ref 15–41)
Albumin: 4.1 g/dL (ref 3.5–5.0)
Alkaline Phosphatase: 47 U/L (ref 38–126)
Anion gap: 7 (ref 5–15)
BUN: 24 mg/dL — ABNORMAL HIGH (ref 8–23)
CO2: 28 mmol/L (ref 22–32)
Calcium: 9.4 mg/dL (ref 8.9–10.3)
Chloride: 103 mmol/L (ref 98–111)
Creatinine, Ser: 1.23 mg/dL (ref 0.61–1.24)
GFR, Estimated: >60 mL/min (ref >60)
Glucose, Bld: 92 mg/dL (ref 70–99)
Potassium: 4.8 mmol/L (ref 3.5–5.1)
Sodium: 138 mmol/L (ref 135–145)
Total Bilirubin: 1.1 mg/dL (ref 0.3–1.2)
Total Protein: 7.4 g/dL (ref 6.5–8.1)

## 2021-09-22 LAB — CBC
HCT: 46.9 % (ref 39.0–52.0)
Hemoglobin: 15.8 g/dL (ref 13.0–17.0)
MCH: 39.1 pg — ABNORMAL HIGH (ref 26.0–34.0)
MCHC: 33.7 g/dL (ref 30.0–36.0)
MCV: 116.1 fL — ABNORMAL HIGH (ref 80.0–100.0)
Platelets: 505 10*3/uL — ABNORMAL HIGH (ref 150–400)
RBC: 4.04 MIL/uL — ABNORMAL LOW (ref 4.22–5.81)
RDW: 14.3 % (ref 11.5–15.5)
WBC: 5.2 10*3/uL (ref 4.0–10.5)
nRBC: 0 % (ref 0.0–0.2)

## 2021-09-22 LAB — PROTIME-INR
INR: 2.1 — ABNORMAL HIGH (ref 0.8–1.2)
Prothrombin Time: 23.5 seconds — ABNORMAL HIGH (ref 11.4–15.2)

## 2021-09-22 SURGERY — CARDIOVERSION
Anesthesia: General

## 2021-09-22 MED ORDER — SODIUM CHLORIDE 0.9 % IV SOLN
INTRAVENOUS | Status: AC | PRN
  Administered 2021-09-22: 500 mL via INTRAVENOUS

## 2021-09-22 MED ORDER — SODIUM CHLORIDE 0.9 % IV SOLN: INTRAVENOUS | Status: DC

## 2021-09-22 MED ORDER — SPIRONOLACTONE 25 MG PO TABS: 25.0000 mg | ORAL_TABLET | ORAL | Status: DC

## 2021-09-22 MED ORDER — PROPOFOL 10 MG/ML IV BOLUS
INTRAVENOUS | Status: DC | PRN
  Administered 2021-09-22: 80 mg via INTRAVENOUS

## 2021-09-22 MED ORDER — LIDOCAINE 2% (20 MG/ML) 5 ML SYRINGE
INTRAMUSCULAR | Status: DC | PRN
  Administered 2021-09-22: 80 mg via INTRAVENOUS

## 2021-09-22 MED ORDER — WARFARIN SODIUM 5 MG PO TABS: 2.5000 mg | ORAL_TABLET | ORAL | Status: DC

## 2021-09-22 NOTE — CV Procedure (Signed)
? ?  Electrical Cardioversion Procedure Note ?Taylor [name] ?[id number] ?10-Mar-1951 ? ?Procedure: Electrical Cardioversion ?Indications:  Atrial Flutter ? ?Time Out: Verified patient identification, verified procedure,medications/allergies/relevent history reviewed, required imaging and test results available.  Performed ? ?Procedure Details ? ?The patient was NPO after midnight. Anesthesia was administered at the beside  by Dr.Miller with '80mg'$  of Lidocaine and '80mg'$  of propofol.  Cardioversion was done with synchronized biphasic defibrillation with AP pads with 150watts.  The patient converted to normal sinus rhythm. The patient tolerated the procedure well  ? ?IMPRESSION: ? ?Successful cardioversion of atrial flutter ? ? ? ?Taylor [name] ?09/22/2021, 9:19 AM ?  ?

## 2021-09-22 NOTE — Transfer of Care (Signed)
Immediate Anesthesia Transfer of Care Note ? ?Patient: Taylor [name] ? ?Procedure(s) Performed: CARDIOVERSION ? ?Patient Location: Endoscopy Unit ? ?Anesthesia Type:General ? ?Level of Consciousness: drowsy ? ?Airway & Oxygen Therapy: Patient Spontanous Breathing ? ?Post-op Assessment: Report given to RN and Post -op Vital signs reviewed and stable ? ?Post vital signs: Reviewed and stable ? ?Last Vitals:  ?Vitals Value Taken Time  ?BP 120/63   ?Temp    ?Pulse 65 09/22/21 1431  ?Resp 17 09/22/21 1431  ?SpO2 94 % 09/22/21 1431  ?Vitals shown include unvalidated device data. ? ?Last Pain:  ?Vitals:  ? 09/22/21 0859  ?TempSrc: Temporal  ?PainSc: 0-No pain  ?   ? ?  ? ?Complications: No notable events documented. ?

## 2021-09-22 NOTE — Anesthesia Preprocedure Evaluation (Signed)
Anesthesia Evaluation  ?Patient identified by MRN, date of birth, ID band ?Patient awake ? ? ? ?Reviewed: ?Allergy & Precautions, NPO status , Patient's Chart, lab work & pertinent test results ? ?Airway ?Mallampati: II ? ?TM Distance: >3 FB ?Neck ROM: Full ? ? ? Dental ? ?(+) Teeth Intact ?  ?Pulmonary ?neg pulmonary ROS, former smoker,  ?  ?Pulmonary exam normal ? ? ? ? ? ? ? Cardiovascular ?hypertension, Pt. on medications and Pt. on home beta blockers ?+ CAD, + CABG and +CHF  ?+ Cardiac Defibrillator ? ?Rhythm:Irregular Rate:Normal ? ? ?  ?Neuro/Psych ?negative neurological ROS ? negative psych ROS  ? GI/Hepatic ?negative GI ROS, Neg liver ROS,   ?Endo/Other  ?negative endocrine ROS ? Renal/GU ?negative Renal ROS  ?negative genitourinary ?  ?Musculoskeletal ?negative musculoskeletal ROS ?(+)  ? Abdominal ?(+)  ?Abdomen: soft. ? ?  ?Peds ? Hematology ? ?(+) Blood dyscrasia, anemia ,   ?Anesthesia Other Findings ? ? Reproductive/Obstetrics ? ?  ? ? ? ? ? ? ? ? ? ? ? ? ? ?  ?  ? ? ? ? ? ? ? ? ?Anesthesia Physical ? ?Anesthesia Plan ? ?ASA: III ? ?Anesthesia Plan: General  ? ?Post-op Pain Management: Minimal or no pain anticipated  ? ?Induction: Intravenous ? ?PONV Risk Score and Plan: 2 and Propofol infusion and Treatment may vary due to age or medical condition ? ?Airway Management Planned: Natural Airway and Simple Face Mask ? ?Additional Equipment: None ? ?Intra-op Plan:  ? ?Post-operative Plan:  ? ?Informed Consent: I have reviewed the patients History and Physical, chart, labs and discussed the procedure including the risks, benefits and alternatives for the proposed anesthesia with the patient or authorized representative who has indicated his/her understanding and acceptance.  ? ? ? ?Dental advisory given ? ?Plan Discussed with: CRNA ? ?Anesthesia Plan Comments:   ? ? ? ? ? ? ?Anesthesia Quick Evaluation ? ?

## 2021-09-22 NOTE — Discharge Instructions (Signed)

## 2021-09-22 NOTE — Anesthesia Postprocedure Evaluation (Signed)
Anesthesia Post Note ? ?Patient: Taylor [name] ? ?Procedure(s) Performed: CARDIOVERSION ? ?  ? ?Patient location during evaluation: PACU ?Anesthesia Type: General ?Level of consciousness: awake and alert ?Pain management: pain level controlled ?Vital Signs Assessment: post-procedure vital signs reviewed and stable ?Respiratory status: spontaneous breathing, nonlabored ventilation and respiratory function stable ?Cardiovascular status: blood pressure returned to baseline and stable ?Postop Assessment: no apparent nausea or vomiting ?Anesthetic complications: no ? ? ?No notable events documented. ? ?Last Vitals:  ?Vitals:  ? 09/22/21 1442 09/22/21 1452  ?BP: (!) 105/58 (!) 113/46  ?Pulse: 62 64  ?Resp: 17 16  ?Temp:    ?SpO2: 97% 99%  ?  ?Last Pain:  ?Vitals:  ? 09/22/21 1452  ?TempSrc:   ?PainSc: 0-No pain  ? ? ?  ?  ?  ?  ?  ?  ? ?Lynda Rainwater ? ? ? ? ?

## 2021-09-22 NOTE — Interval H&P Note (Signed)
History and Physical Interval Note: ? ?09/22/2021 ?12:32 PM ? ?[facility]  has presented today for surgery, with the diagnosis of AFLUTTER.  The various methods of treatment have been discussed with the patient and family. After consideration of risks, benefits and other options for treatment, the patient has consented to  Procedure(s): ?CARDIOVERSION (N/A) as a surgical intervention.  The patient's history has been reviewed, patient examined, no change in status, stable for surgery.  I have reviewed the patient's chart and labs.  Questions were answered to the patient's satisfaction.   ? ? ?Rivka Baune ? ? ?

## 2021-09-23 ENCOUNTER — Encounter (HOSPITAL_COMMUNITY): Payer: Self-pay | Admitting: Cardiology

## 2021-09-30 ENCOUNTER — Telehealth: Payer: Self-pay

## 2021-09-30 NOTE — Telephone Encounter (Signed)
Patient calling back. States he is unaware he has gone back into AF/FL. Asymptomatic. Confirmed patient continues to take Amiodarone, Coreg, and Coumadin as prescribed. Will route to Dr. Caryl Comes as Juluis Rainier.  ?

## 2021-09-30 NOTE — Telephone Encounter (Signed)
BSC alert Atrial Arrhythmia Burden of at least 6.0 hours in a 24 hour period. ?Presenting rhythm AF/Flutter with controlled rates ongoing from 5/3 @ 02:57 ?Amiodaron, Coreg, Warfarin ?DCCV 4/27, route to triage ?LA ? ?Unsuccessful telephone encounter to patient to assess for recurring AF/FL post DCCV 4/27. Hipaa compliant VM message left requesting call back to [phone]. ? ? ? ? ? ? ? ? ? ?

## 2021-10-03 ENCOUNTER — Telehealth: Payer: Self-pay | Admitting: Internal Medicine

## 2021-10-03 NOTE — Telephone Encounter (Signed)
Called patient back, patient stated that his pacemaker has moved down to the center of his chest, it was up near his shoulder offered patient apt today with device clinic patient stated that it was too late in the day, patient also asked what the clinic would do if the pacemaker had moved informed him that he would likely be sent over to the hospital. Advised patient  that if it had moved that much it was important to get it looked at patient stated it has been this way for a week. Patient agreeable to appointment tomorrow at 10:00AM with device clinic.  ?

## 2021-10-03 NOTE — Telephone Encounter (Signed)
Calling to say that he thinks he pacemaker has moved. Please advise  ?

## 2021-10-04 ENCOUNTER — Ambulatory Visit (INDEPENDENT_AMBULATORY_CARE_PROVIDER_SITE_OTHER): Payer: Medicare Other

## 2021-10-04 ENCOUNTER — Telehealth: Payer: Self-pay

## 2021-10-04 NOTE — Telephone Encounter (Signed)
Patient would like for me to send message to Dr. Caryl Comes to ask if he is candidate for ablation? ?

## 2021-10-04 NOTE — Progress Notes (Signed)
Patient seen in device clinic to check placement in ICD. Patient voices concern that his device has moved lower in his chest since implant. Dr. Curt Bears in to assess patient and device appears to be in place well and no further actions required. Remote transmission reviewed 10/02/21 and shows normal device function.  ?

## 2021-10-10 ENCOUNTER — Telehealth: Payer: Self-pay | Admitting: Internal Medicine

## 2021-10-10 DIAGNOSIS — I4819 Other persistent atrial fibrillation: Secondary | ICD-10-CM

## 2021-10-10 DIAGNOSIS — I255 Ischemic cardiomyopathy: Secondary | ICD-10-CM

## 2021-10-10 NOTE — Telephone Encounter (Signed)
Patient calling to schedule ablation. ?

## 2021-10-10 NOTE — Telephone Encounter (Signed)
Spoke with Dr Quentin Ore and asked that he review pt's chart.  He has reviewed and recommends pt discuss referral for ablation with Dr Caryl Comes.  Appointment scheduled for telephone visit on 11/07/2021 at 345pm.  Attempted to contact pt by phone and left a voicemail message.  MyChart message also sent to pt re: appointment. ?

## 2021-10-10 NOTE — Telephone Encounter (Signed)
Spoke with pt and advised Dr Caryl Comes will need to determine if he feels pt is a candidate for ablation and that Dr Caryl Comes is [place] of [place] country until next week.  Pt states he is willing to change providers if this will move the process along.  Pt advised to continue current medications as prescribed and will discuss with Dr Quentin Ore due to Dr Caryl Comes being out of the office. ?

## 2021-10-18 ENCOUNTER — Ambulatory Visit (HOSPITAL_COMMUNITY): Payer: Medicare Other | Attending: Internal Medicine

## 2021-10-18 DIAGNOSIS — I255 Ischemic cardiomyopathy: Secondary | ICD-10-CM | POA: Diagnosis present

## 2021-10-18 DIAGNOSIS — I4819 Other persistent atrial fibrillation: Secondary | ICD-10-CM | POA: Diagnosis present

## 2021-10-18 LAB — ECHOCARDIOGRAM COMPLETE
AV Mean grad: 5 mmHg
AV Peak grad: 8.6 mmHg
Ao pk vel: 1.47 m/s
Area-P 1/2: 4.39 cm2
P 1/2 time: 454 msec
S' Lateral: 5.3 cm

## 2021-10-18 NOTE — Telephone Encounter (Signed)
Order placed for echo per Dr Caryl Comes and pt notified he will be contacted for scheduling.  Pt verbalizes understanding and agrees with current plan.

## 2021-10-18 NOTE — Addendum Note (Signed)
Addended by: Thora Lance on: 10/18/2021 09:04 AM   Modules accepted: Orders

## 2021-10-20 ENCOUNTER — Ambulatory Visit (HOSPITAL_COMMUNITY): Payer: Medicare Other | Admitting: Physician Assistant

## 2021-10-20 ENCOUNTER — Ambulatory Visit (INDEPENDENT_AMBULATORY_CARE_PROVIDER_SITE_OTHER): Payer: Medicare Other | Admitting: *Deleted

## 2021-10-20 DIAGNOSIS — Z5181 Encounter for therapeutic drug level monitoring: Secondary | ICD-10-CM | POA: Diagnosis not present

## 2021-10-20 DIAGNOSIS — Z952 Presence of prosthetic heart valve: Secondary | ICD-10-CM | POA: Diagnosis not present

## 2021-10-20 LAB — POCT INR: INR: 2.9 (ref 2.0–3.0)

## 2021-10-20 NOTE — Patient Instructions (Signed)
Description   Continue warfarin 1 tablet daily except 1/2 tablet on Mondays, Wednesdays and Fridays Recheck in 6 wks Coumadin Clinic [phone]

## 2021-10-21 ENCOUNTER — Encounter: Payer: Self-pay | Admitting: Internal Medicine

## 2021-10-21 ENCOUNTER — Telehealth: Payer: Self-pay | Admitting: Internal Medicine

## 2021-10-21 NOTE — Telephone Encounter (Signed)
Spoke with pt and advised to continue current medications and to plan to keep current telephone visit with Dr Caryl Comes scheduled for 11/07/2021.  Pt is also aware he is on cancellation list for an earlier appointment.  Pt verbalizes understanding and agrees with current plan.

## 2021-10-21 NOTE — Telephone Encounter (Signed)
Pt is calling back stating they'd like a call back to discuss this sooner due to him being in afib. He says he does not want to wait until 11/07/21. Also, has questions about when he will be able to schedule ablation. Please advise.  See Pt message in chart review.

## 2021-10-21 NOTE — Telephone Encounter (Signed)
Pt is calling back stating they'd like a call back to discuss this sooner due to him being in afib. He says he does not want to wait until 11/07/21. Also, has questions about when he will be able to schedule ablation. Please advise.

## 2021-10-31 DIAGNOSIS — I442 Atrioventricular block, complete: Secondary | ICD-10-CM | POA: Insufficient documentation

## 2021-10-31 DIAGNOSIS — I493 Ventricular premature depolarization: Secondary | ICD-10-CM | POA: Insufficient documentation

## 2021-11-03 ENCOUNTER — Ambulatory Visit (INDEPENDENT_AMBULATORY_CARE_PROVIDER_SITE_OTHER): Payer: Medicare Other

## 2021-11-03 DIAGNOSIS — I255 Ischemic cardiomyopathy: Secondary | ICD-10-CM | POA: Diagnosis not present

## 2021-11-03 LAB — CUP PACEART REMOTE DEVICE CHECK
Battery Remaining Longevity: 66 mo
Battery Remaining Percentage: 87 %
Brady Statistic RA Percent Paced: 0 %
Brady Statistic RV Percent Paced: 90 %
Date Time Interrogation Session: 20230608005400
HighPow Impedance: 68 Ohm
Implantable Lead Implant Date: 20180105
Implantable Lead Implant Date: 20180105
Implantable Lead Implant Date: 20180105
Implantable Lead Location: 753858
Implantable Lead Location: 753859
Implantable Lead Location: 753860
Implantable Lead Model: 292
Implantable Lead Model: 4672
Implantable Lead Model: 7741
Implantable Lead Serial Number: 428973
Implantable Lead Serial Number: 801088
Implantable Lead Serial Number: 849519
Implantable Pulse Generator Implant Date: 20180105
Lead Channel Impedance Value: 658 Ohm
Lead Channel Impedance Value: 688 Ohm
Lead Channel Impedance Value: 747 Ohm
Lead Channel Pacing Threshold Amplitude: 0.7 V
Lead Channel Pacing Threshold Amplitude: 1 V
Lead Channel Pacing Threshold Amplitude: 1 V
Lead Channel Pacing Threshold Pulse Width: 0.4 ms
Lead Channel Pacing Threshold Pulse Width: 0.4 ms
Lead Channel Pacing Threshold Pulse Width: 1 ms
Lead Channel Setting Pacing Amplitude: 2 V
Lead Channel Setting Pacing Amplitude: 2 V
Lead Channel Setting Pacing Amplitude: 2.2 V
Lead Channel Setting Pacing Pulse Width: 0.4 ms
Lead Channel Setting Pacing Pulse Width: 1 ms
Lead Channel Setting Sensing Sensitivity: 0.6 mV
Lead Channel Setting Sensing Sensitivity: 1 mV
Pulse Gen Serial Number: 180139

## 2021-11-07 ENCOUNTER — Encounter: Payer: Self-pay | Admitting: Internal Medicine

## 2021-11-07 ENCOUNTER — Ambulatory Visit (INDEPENDENT_AMBULATORY_CARE_PROVIDER_SITE_OTHER): Payer: Medicare Other | Admitting: Internal Medicine

## 2021-11-07 DIAGNOSIS — I442 Atrioventricular block, complete: Secondary | ICD-10-CM | POA: Diagnosis not present

## 2021-11-07 DIAGNOSIS — I4819 Other persistent atrial fibrillation: Secondary | ICD-10-CM

## 2021-11-07 DIAGNOSIS — I255 Ischemic cardiomyopathy: Secondary | ICD-10-CM | POA: Diagnosis not present

## 2021-11-07 DIAGNOSIS — I493 Ventricular premature depolarization: Secondary | ICD-10-CM

## 2021-11-07 DIAGNOSIS — Z95810 Presence of automatic (implantable) cardiac defibrillator: Secondary | ICD-10-CM

## 2021-11-07 NOTE — Patient Instructions (Addendum)
Medication Instructions:  Your physician recommends that you continue on your current medications as directed. Please refer to the Current Medication list given to you today.  *If you need a refill on your cardiac medications before your next appointment, please call your pharmacy*   Lab Work: None ordered.  If you have labs (blood work) drawn today and your tests are completely normal, you will receive your results only by: [place] (if you have MyChart) OR A paper copy in the mail If you have any lab test that is abnormal or we need to change your treatment, we will call you to review the results.   Testing/Procedures: None ordered.    Follow-Up: At [facility], you and your health needs are our priority.  As part of our continuing mission to provide you with exceptional heart care, we have created designated Provider Care Teams.  These Care Teams include your primary Cardiologist (physician) and Advanced Practice Providers (APPs -  Physician Assistants and Nurse Practitioners) who all work together to provide you with the care you need, when you need it.  We recommend signing up for the patient portal called "MyChart".  Sign up information is provided on this After Visit Summary.  MyChart is used to connect with patients for Virtual Visits (Telemedicine).  Patients are able to view lab/test results, encounter notes, upcoming appointments, etc.  Non-urgent messages can be sent to your provider as well.   To learn more about what you can do with MyChart, go to [email or web address].    Your next appointment:   Referral to Dr Norm Salt with [facility] placed  Important Information About Sugar

## 2021-11-07 NOTE — Progress Notes (Signed)
Electrophysiology TeleHealth Note   Due to national recommendations of social distancing due to COVID 19, an audio/video telehealth visit is felt to be most appropriate for this patient at this time.  See MyChart message from today for the patient's consent to telehealth for [name].   Date:  11/07/2021   ID:  Taylor [name], DOB 1951/03/15, MRN [id number]  Location: patient's home  Provider location: [address] [place]  Evaluation Performed: Follow-up visit  PCP:  Michell Heinrich, DO  Cardiologist:    DB Electrophysiologist:  SK   Chief Complaint:     History of Present Illness:    Taylor [name] is a 71 y.o. male who presents via audio/video conferencing for a telehealth visit today.  Since last being seen in our clinic for VF arrest 12/17 w hx of antecedent  mechanical aortic valve for congenital aortic malformation with aortic insufficiency.   Following his arrest he underwent CABG and mechanical mitral valve replacement. Ended up with postoperative CHB and underwent CRT-D implantation for ischemic cardiomyopathy with prior MI and prior PCI  the patient reports having gone to [employer] and then had recurrent palpitations  History of PVCs started on amiodarone at [facility].  (100 mg a day)  Anticoagulated for the valves with warfarin and aspirin.  Eddie [facility] identified onset 2/23 underwent cardioversion 4/23 and reverted within a week or so to atrial fibrillation.  Hx of polysubstance abuse / particularly alcohol   no more alcohol and now no more dizziness--  fatigued once a day- not assoc with effort   No sleep disordered breathing  DATE TEST  EF    1/18 Echo  40 %     12/18 Echo  25-30%      10/20 Echo  20-25%   BAE  5/23 Echo   25-30%  LAE mod      Date Cr K Hgb TSH LFTs  1/20 1.19 3.8 13.1 21.5 23  2/21  1.09 3.7     110(1/21)  5/21 1.13 4.2 15.1 1.57 34  5/23 1.23<<2.9 4.8 15.8 (MCV  116)  18      Efforts to follow laboratories for amiodarone  and for his macrocytosis have resulted in no blood work being drawn     Past Medical History:  Diagnosis Date   Atherosclerotic heart disease of native coronary artery without angina pectoris    Chronic anticoagulation 8/58/8502   Chronic systolic heart failure ([place]) 02/02/2017   Essential thrombocythemia ([place]) 02/15/2017   Hyperlipidemia    Hypertension    Ischemic cardiomyopathy    Leg pain, central, left 04/17/2017   Hx sciatica - pain similar; no red flag signs on hx or exam    Presence of other heart-valve replacement    Ventricular tachycardia ([place])     Past Surgical History:  Procedure Laterality Date   BIV ICD INSERTION CRT-D     CARDIOVERSION N/A 10/21/2020   Procedure: CARDIOVERSION;  Surgeon: Lelon Perla, MD;  Location: [place];  Service: Cardiovascular;  Laterality: N/A;   CARDIOVERSION N/A 09/22/2021   Procedure: CARDIOVERSION;  Surgeon: Sueanne Margarita, MD;  Location: [place];  Service: Cardiovascular;  Laterality: N/A;   CORONARY ARTERY BYPASS GRAFT  2017   MECHANICAL AORTIC VALVE REPLACEMENT     TEE WITHOUT CARDIOVERSION N/A 10/21/2020   Procedure: TRANSESOPHAGEAL ECHOCARDIOGRAM (TEE);  Surgeon: Lelon Perla, MD;  Location: [facility] ENDOSCOPY;  Service: Cardiovascular;  Laterality: N/A;    Current Outpatient Medications  Medication  Sig Dispense Refill   amiodarone (PACERONE) 200 MG tablet TAKE 1/2 TABLET(100 MG) BY MOUTH DAILY 45 tablet 3   amoxicillin (AMOXIL) 500 MG capsule Take 4 capsules (2.'000mg'$ ) one hour before dental procedures. 4 capsule 0   Ascorbic Acid (CVS VITAMIN C PO) Take 1 tablet by mouth daily. 60 mg Vit C and 15 mg Zinc ( combo)     aspirin 81 MG tablet Take 81 mg by mouth daily.     carvedilol (COREG) 3.125 MG tablet TAKE 1 TABLET(3.125 MG) BY MOUTH TWICE DAILY WITH A MEAL 180 tablet 3   cholecalciferol (VITAMIN D3) 25 MCG (1000 UNIT) tablet Take 1,000 Units by mouth daily.     dapagliflozin propanediol (FARXIGA) 10 MG TABS tablet  Take 1 tablet (10 mg total) by mouth daily. 90 tablet 3   hydroxyurea (HYDREA) 500 MG capsule TAKE 1 CAPSULE BY MOUTH ALTERNATING WITH 2 CAPSULES DAILY(500 MG ONE DAY AND 1000 MG NEXT DAY). (Patient taking differently: Take 500-1,000 mg by mouth See admin instructions. Taking '500mg'$  one day then 2 capsules ( '1000mg'$ ) the next day) 135 capsule 3   levothyroxine (SYNTHROID) 75 MCG tablet Take 75 mcg by mouth daily before breakfast.     sacubitril-valsartan (ENTRESTO) 24-26 MG Take 1 tablet by mouth daily at 6 (six) AM. 180 tablet 3   sildenafil (REVATIO) 20 MG tablet Take 20 mg by mouth daily as needed (ED).     spironolactone (ALDACTONE) 25 MG tablet Take 1 tablet (25 mg total) by mouth every other day.     vitamin B-12 (CYANOCOBALAMIN) 1000 MCG tablet Take 1,000 mcg by mouth daily.     warfarin (COUMADIN) 5 MG tablet Take 0.5-1 tablets (2.5-5 mg total) by mouth See admin instructions. Take 5 mg on Sunday, Tues,Thur, Saturday and 2.5 mg on Mon, Wed, Friday     benzonatate (TESSALON) 100 MG capsule Take 100 mg by mouth 3 (three) times daily as needed for cough. (Patient not taking: Reported on 11/07/2021)     No current facility-administered medications for this visit.    Allergies:   Erythromycin, Rosuvastatin calcium, Simvastatin, and Zithromax [azithromycin]   Social History:  The patient  reports that he quit smoking about 5 years ago. His smoking use included cigarettes. He started smoking about 42 years ago. He has a 30.00 pack-year smoking history. He has never used smokeless tobacco. He reports current alcohol use of about 10.0 standard drinks of alcohol per week. He reports that he does not use drugs.   Family History:  The patient's   family history includes Hypertension in his father.   ROS:  Please see the history of present illness.   All other systems are personally reviewed and negative.    Exam:    Vital Signs:  There were no vitals taken for this visit.        Labs/Other  Tests and Data Reviewed:    Recent Labs: 02/03/2021: B Natriuretic Peptide 303.9 09/22/2021: ALT 18; BUN 24; Creatinine, Ser 1.23; Hemoglobin 15.8; Platelets 505; Potassium 4.8; Sodium 138   Wt Readings from Last 3 Encounters:  09/22/21 164 lb 0.4 oz (74.4 kg)  09/09/21 164 lb (74.4 kg)  02/03/21 158 lb 9.6 oz (71.9 kg)     Other studies personally reviewed: Additional studies/ records that were reviewed today include:     ASSESSMENT & PLAN:    Ischemic cardiomyopathy status post CABG   Aborted cardiac arrest   Valvular heart disease status post mechanical aortic/mitral valve replacement  ICD-[employer]-CRT-D     PVCs   Complete Heart Block   High Risk Medication Surveillance Amiodarone therapy   Hypothyroidism   Hypertransaminasemia    Macrocytosis   Alcohol abuse   He would like to pursue ablation.  He is disinclined towards repeat cardioversion.  It did not work very well.  He is not inclined to increase his amiodarone at this point.  Furthermore, he would like to pursue cryoablation and would like to have a referral to [facility].  I will try and arrange for him to see Dr. Norm Salt.  His MCV is very high, I have encouraged him to follow-up with his PCP regarding folate and B12 levels and his last TSH had changed markedly from hypothyroid to euthyroid and need to make sure that he is now not hyperthyroid.  We will do these labs with his PCP.   COVID 19 screen The patient denies symptoms of COVID 19 at this time.  The importance of social distancing was discussed today.  Follow-up:  12 m     Current medicines are reviewed at length with the patient today.   The patient  concerns regarding his medicines.  The following changes were made today:    Labs/ tests ordered today include: labs with his PCP B12 , folate and TSH No orders of the defined types were placed in this encounter.      Patient Risk:  after full review of this patients clinical status,  I feel that they are at moderate risk at this time.  Today, I have spent 21 minutes with the patient with telehealth technology discussing the above.  Signed, Virl Axe, MD  11/07/2021 5:48 PM     [place] [address] [place] [address] [phone] (office) [phone] (fax)

## 2021-11-07 NOTE — Addendum Note (Signed)
Addended by: Thora Lance on: 11/07/2021 06:24 PM   Modules accepted: Orders

## 2021-11-09 ENCOUNTER — Telehealth: Payer: Self-pay | Admitting: Internal Medicine

## 2021-11-09 NOTE — Telephone Encounter (Signed)
New Message:   Patient says he would like an order sent to his lab order to his lab in [place], [place]. Please fax to [place] [phone].

## 2021-11-11 NOTE — Progress Notes (Signed)
Remote ICD transmission.   

## 2021-11-19 ENCOUNTER — Other Ambulatory Visit: Payer: Self-pay

## 2021-11-19 DIAGNOSIS — I714 Abdominal aortic aneurysm, without rupture, unspecified: Secondary | ICD-10-CM

## 2021-12-01 ENCOUNTER — Ambulatory Visit: Payer: Medicare Other | Admitting: *Deleted

## 2021-12-01 DIAGNOSIS — Z5181 Encounter for therapeutic drug level monitoring: Secondary | ICD-10-CM

## 2021-12-01 DIAGNOSIS — Z952 Presence of prosthetic heart valve: Secondary | ICD-10-CM | POA: Diagnosis not present

## 2021-12-01 LAB — POCT INR: INR: 2.3 (ref 2.0–3.0)

## 2021-12-01 NOTE — Patient Instructions (Signed)
Pending AF ablation at [facility] on 12/20/21 Continue warfarin 1 tablet daily except 1/2 tablet on Mondays, Wednesdays and Fridays Recheck in 5 wks Coumadin Clinic [phone]

## 2021-12-15 ENCOUNTER — Ambulatory Visit: Payer: Medicare Other

## 2021-12-16 ENCOUNTER — Ambulatory Visit: Payer: Medicare Other

## 2021-12-16 ENCOUNTER — Other Ambulatory Visit (HOSPITAL_COMMUNITY): Payer: Medicare Other

## 2021-12-19 ENCOUNTER — Ambulatory Visit: Payer: Medicare Other | Admitting: *Deleted

## 2021-12-19 DIAGNOSIS — Z952 Presence of prosthetic heart valve: Secondary | ICD-10-CM | POA: Diagnosis not present

## 2021-12-19 DIAGNOSIS — I4892 Unspecified atrial flutter: Secondary | ICD-10-CM | POA: Diagnosis not present

## 2021-12-19 DIAGNOSIS — Z5181 Encounter for therapeutic drug level monitoring: Secondary | ICD-10-CM | POA: Diagnosis not present

## 2021-12-19 LAB — POCT INR: INR: 2.2 (ref 2.0–3.0)

## 2021-12-19 NOTE — Patient Instructions (Signed)
Pending AF ablation at [facility] on 12/20/21 Continue warfarin 1 tablet daily except 1/2 tablet on Mondays, Wednesdays and Fridays Recheck in 2 wks Coumadin Clinic [phone]

## 2022-01-05 ENCOUNTER — Ambulatory Visit: Payer: Medicare Other | Admitting: *Deleted

## 2022-01-05 DIAGNOSIS — Z5181 Encounter for therapeutic drug level monitoring: Secondary | ICD-10-CM

## 2022-01-05 DIAGNOSIS — Z952 Presence of prosthetic heart valve: Secondary | ICD-10-CM

## 2022-01-05 LAB — POCT INR: INR: 2.2 (ref 2.0–3.0)

## 2022-01-05 NOTE — Patient Instructions (Signed)
Wants to increase Kale in his diet. Increase warfarin to 1 tablet daily except 1/2 tablet on Mondays and Fridays Recheck in 3 wks Coumadin Clinic [phone]

## 2022-01-06 ENCOUNTER — Ambulatory Visit: Payer: Medicare Other | Admitting: Physician Assistant

## 2022-01-06 ENCOUNTER — Ambulatory Visit (HOSPITAL_COMMUNITY)
Admission: RE | Admit: 2022-01-06 | Discharge: 2022-01-06 | Disposition: A | Discharge status: Home / Self Care | Payer: Medicare Other | Source: Ambulatory Visit | Attending: Vascular Surgery | Admitting: Vascular Surgery

## 2022-01-06 ENCOUNTER — Other Ambulatory Visit (HOSPITAL_COMMUNITY): Payer: Self-pay | Admitting: Physician Assistant

## 2022-01-06 ENCOUNTER — Other Ambulatory Visit: Payer: Self-pay

## 2022-01-06 VITALS — BP 115/69 | HR 59 | Temp 93.7°F | Resp 16 | Ht 69.0 in | Wt 155.0 lb

## 2022-01-06 DIAGNOSIS — I714 Abdominal aortic aneurysm, without rupture, unspecified: Secondary | ICD-10-CM

## 2022-01-06 NOTE — Progress Notes (Signed)
[facility] [place] HISTORY AND PHYSICAL   History of Present Illness:  Patient is a 71 y.o. year old male who presents for evaluation of abdominal aortic aneurysm.  The asymptomatic abdominal aortic aneurysms discovered on lumbar spine CT in 2019.    He was last seen in our office 12/22/20 measuring 4.6 cm at largest diameter.  This was unchanged from previous duplex.  He remains asymptomatic for sever abdominal or lumbar pain.     He is medically managed on ASA and Statin daily.  Past Medical History:  Diagnosis Date   Atherosclerotic heart disease of native coronary artery without angina pectoris    Chronic anticoagulation 5/57/3220   Chronic systolic heart failure ([place]) 02/02/2017   Essential thrombocythemia ([place]) 02/15/2017   Hyperlipidemia    Hypertension    Ischemic cardiomyopathy    Leg pain, central, left 04/17/2017   Hx sciatica - pain similar; no red flag signs on hx or exam    Presence of other heart-valve replacement    Ventricular tachycardia ([facility])     Past Surgical History:  Procedure Laterality Date   BIV ICD INSERTION CRT-D     CARDIOVERSION N/A 10/21/2020   Procedure: CARDIOVERSION;  Surgeon: Lelon Perla, MD;  Location: [place];  Service: Cardiovascular;  Laterality: N/A;   CARDIOVERSION N/A 09/22/2021   Procedure: CARDIOVERSION;  Surgeon: Sueanne Margarita, MD;  Location: [place];  Service: Cardiovascular;  Laterality: N/A;   CORONARY ARTERY BYPASS GRAFT  2017   MECHANICAL AORTIC VALVE REPLACEMENT     TEE WITHOUT CARDIOVERSION N/A 10/21/2020   Procedure: TRANSESOPHAGEAL ECHOCARDIOGRAM (TEE);  Surgeon: Lelon Perla, MD;  Location: [facility] ENDOSCOPY;  Service: Cardiovascular;  Laterality: N/A;     Social History Social History   Tobacco Use   Smoking status: Former    Packs/day: 1.00    Years: 30.00    Total pack years: 30.00    Types: Cigarettes    Start date: 05/26/1979    Quit date: 05/18/2016    Years since quitting:  5.6   Smokeless tobacco: Never  Vaping Use   Vaping Use: Never used  Substance Use Topics   Alcohol use: Yes    Alcohol/week: 10.0 standard drinks of alcohol    Types: 10 Cans of beer per week    Comment: 1 can a day..   Drug use: No    Family History Family History  Problem Relation Age of Onset   Hypertension Father     Allergies  Allergies  Allergen Reactions   Erythromycin Rash   Rosuvastatin Calcium     Muscle cramps   Simvastatin     Muscle cramps   Zithromax [Azithromycin] Rash     Current Outpatient Medications  Medication Sig Dispense Refill   amiodarone (PACERONE) 200 MG tablet TAKE 1/2 TABLET(100 MG) BY MOUTH DAILY 45 tablet 3   amoxicillin (AMOXIL) 500 MG capsule Take 4 capsules (2.'000mg'$ ) one hour before dental procedures. 4 capsule 0   Ascorbic Acid (CVS VITAMIN C PO) Take 1 tablet by mouth daily. 60 mg Vit C and 15 mg Zinc ( combo)     aspirin 81 MG tablet Take 81 mg by mouth daily.     carvedilol (COREG) 3.125 MG tablet TAKE 1 TABLET(3.125 MG) BY MOUTH TWICE DAILY WITH A MEAL 180 tablet 3   cholecalciferol (VITAMIN D3) 25 MCG (1000 UNIT) tablet Take 1,000 Units by mouth daily.     dapagliflozin propanediol (FARXIGA) 10 MG TABS tablet Take  1 tablet (10 mg total) by mouth daily. 90 tablet 3   hydroxyurea (HYDREA) 500 MG capsule TAKE 1 CAPSULE BY MOUTH ALTERNATING WITH 2 CAPSULES DAILY(500 MG ONE DAY AND 1000 MG NEXT DAY). (Patient taking differently: Take 500-1,000 mg by mouth See admin instructions. Taking '500mg'$  one day then 2 capsules ( '1000mg'$ ) the next day) 135 capsule 3   levothyroxine (SYNTHROID) 75 MCG tablet Take 75 mcg by mouth daily before breakfast.     sacubitril-valsartan (ENTRESTO) 24-26 MG Take 1 tablet by mouth daily at 6 (six) AM. 180 tablet 3   sildenafil (REVATIO) 20 MG tablet Take 20 mg by mouth daily as needed (ED).     spironolactone (ALDACTONE) 25 MG tablet Take 1 tablet (25 mg total) by mouth every other day.     vitamin B-12  (CYANOCOBALAMIN) 1000 MCG tablet Take 1,000 mcg by mouth daily.     warfarin (COUMADIN) 5 MG tablet Take 0.5-1 tablets (2.5-5 mg total) by mouth See admin instructions. Take 5 mg on Sunday, Tues,Thur, Saturday and 2.5 mg on Mon, Wed, Friday     benzonatate (TESSALON) 100 MG capsule Take 100 mg by mouth 3 (three) times daily as needed for cough. (Patient not taking: Reported on 11/07/2021)     No current facility-administered medications for this visit.    ROS:   General:  No weight loss, Fever, chills  HEENT: No recent headaches, no nasal bleeding, no visual changes, no sore throat  Neurologic: No dizziness, blackouts, seizures. No recent symptoms of stroke or mini- stroke. No recent episodes of slurred speech, or temporary blindness.  Cardiac: No recent episodes of chest pain/pressure, no shortness of breath at rest.  No shortness of breath with exertion.  Denies history of atrial fibrillation or irregular heartbeat  Vascular: No history of rest pain in feet.  No history of claudication.  No history of non-healing ulcer, No history of DVT   Pulmonary: No home oxygen, no productive cough, no hemoptysis,  No asthma or wheezing  Musculoskeletal:  '[ ]'$  Arthritis, '[ ]'$  Low back pain,  [x ] Joint pain  Hematologic:No history of hypercoagulable state.  No history of easy bleeding.  No history of anemia  Gastrointestinal: No hematochezia or melena,  No gastroesophageal reflux, no trouble swallowing  Urinary: '[ ]'$  chronic Kidney disease, '[ ]'$  on HD - '[ ]'$  MWF or '[ ]'$  TTHS, '[ ]'$  Burning with urination, '[ ]'$  Frequent urination, '[ ]'$  Difficulty urinating;   Skin: No rashes  Psychological: No history of anxiety,  No history of depression   Physical Examination  Vitals:   01/06/22 0939  BP: 115/69  Pulse: (!) 59  Resp: 16  Temp: (!) 93.7 F (34.3 C)  TempSrc: Temporal  SpO2: 99%  Weight: 155 lb (70.3 kg)  Height: '5\' 9"'$  (1.753 m)    Body mass index is 22.89 kg/m.  General:  Alert and  oriented, no acute distress HEENT: Normal Neck: No bruit or JVD Pulmonary: Clear to auscultation bilaterally Cardiac: Regular Rate and Rhythm without murmur Gastrointestinal: Soft, non-tender, non-distended, no mass, no scars Skin: No rash Extremity Pulses:  2+ radial, brachial, femoral,tibial pulses bilaterally Musculoskeletal: No deformity or edema, no ischemic changes  Neurologic: Upper and lower extremity motor 5/5 and symmetric  DATA:       Abdominal Aorta Findings:  +-------------+-------+----------+----------+----------+--------+--------+  Location     AP (cm)Trans (cm)PSV (cm/s)Waveform  ThrombusComments  +-------------+-------+----------+----------+----------+--------+--------+  Proximal     2.20   1.99  67                                    +-------------+-------+----------+----------+----------+--------+--------+  Mid          2.26   2.52      46                                    +-------------+-------+----------+----------+----------+--------+--------+  Distal       4.89   4.94      19                                    +-------------+-------+----------+----------+----------+--------+--------+  RT CIA Prox  1.3    1.0       93        monophasic                  +-------------+-------+----------+----------+----------+--------+--------+  RT CIA Mid   1.8    1.7       81        monophasic                  +-------------+-------+----------+----------+----------+--------+--------+  RT EIA Distal                 126       biphasic                    +-------------+-------+----------+----------+----------+--------+--------+  LT CIA Prox  1.4    1.2       96        monophasic                  +-------------+-------+----------+----------+----------+--------+--------+  LT CIA Mid   1.9    1.8       60                                     +-------------+-------+----------+----------+----------+--------+--------+  LT EIA Distal                 71        monophasic                  +-------------+-------+----------+----------+----------+--------+--------+    Summary:  Abdominal Aorta: There is evidence of abnormal dilatation of the distal  Abdominal aorta. The largest aortic diameter has increased compared to  prior exam. Bilateral mid CIA measurements have no significant change. See  above for comparison measurements.     ASSESSMENT:  Asymptomatic AAA On duplex the diameter increased from previous study 4.6 cm to 4.9.  He denies Abdominal/lumbar pain issues.  He is ambulatory without claudication, no rest pain or ischemic changes to B LE.   PLAN:I will schedule him to come back in 6 months for repeat duplex.  If the diameter increases or is larger than 5.5 cm we will get a CTA of the Abd/pelvis for a more detailed study of the AAA.  If develops symptoms of abd/lumbar pain he should call 911.     Roxy Horseman PA-C [facility] [place] Office: [phone]   MD in clinic [place]

## 2022-01-09 ENCOUNTER — Telehealth: Payer: Self-pay | Admitting: Internal Medicine

## 2022-01-09 ENCOUNTER — Telehealth: Payer: Self-pay

## 2022-01-09 NOTE — Telephone Encounter (Signed)
Pt called stating he is returning a call from [place] and someone else today. Did not see a phone note, please advise.

## 2022-01-09 NOTE — Telephone Encounter (Signed)
Alert [employer]   Device alert - Antitachycardia pacing (ATP) therapy delivered to convert arrhythmia Event occurred 8/12 @ 03:38, EGM shows sustained VT, rate 183, ATPx2 converting to regular AS/BiV pace  Spoke with patient, patient reported compliance with medications  informed patient of DMV restrictions x 6 months, patient concerned due to him leaving for [place], patients travel plans reviewed with Dr. Caryl Comes. Informed patient that Dr. Caryl Comes recommends patient continue to take medications as prescribed, stay hydrated and that Dr. Caryl Comes stated that"there are wonderful EP doctors in [place]"

## 2022-01-10 ENCOUNTER — Other Ambulatory Visit: Payer: Self-pay

## 2022-01-10 ENCOUNTER — Telehealth: Payer: Self-pay

## 2022-01-10 MED ORDER — AMIODARONE HCL 200 MG PO TABS: 200.0000 mg | ORAL_TABLET | Freq: Every day | ORAL | 3 refills | Status: DC

## 2022-01-10 MED ORDER — AMIODARONE HCL 200 MG PO TABS: ORAL_TABLET | ORAL | 0 refills | Status: DC

## 2022-01-10 NOTE — Telephone Encounter (Signed)
[employer] Alert  Latitude alert for VT/therapies. Episode occurring 01/09/22 @ 0201, VT with rate avg 185--211bpm, ATP x 3 converted rhythm. Duration per episode log 42mn15sec. Routing for further review per protocol.   Reviewed with Dr. KCaryl Comes Dr. KCaryl Comescalled and spoke to patient himself. Please see note to discuss further. Patient is aware of driving restrictions per [place] DMV x6 months with a new start date of 01/09/22.

## 2022-01-10 NOTE — Progress Notes (Signed)
Dr Caryl Comes has spoken with the pt and advised:   Amiodarone '200mg'$  - 2 tablets by mouth twice daily x 2 weeks then reduce to Amiodarone '200mg'$  - 2 tablets once daily then '200mg'$  - 1 tablet by mouth daily.   Rx sent to pharmacy as requested by pt.  Pt verbalizes understanding of all instructions.

## 2022-01-10 NOTE — Telephone Encounter (Signed)
Spoke with pt who states he had his ablation completed at [facility] but has not yet been scheduled for the cardiac PET scan.  Pt states he must call [facility] tomorrow and will further inquire about the testing and let us know what he finds out.

## 2022-01-11 ENCOUNTER — Other Ambulatory Visit: Payer: Self-pay

## 2022-01-11 ENCOUNTER — Other Ambulatory Visit: Payer: Self-pay | Admitting: *Deleted

## 2022-01-11 ENCOUNTER — Telehealth: Payer: Self-pay | Admitting: Internal Medicine

## 2022-01-11 ENCOUNTER — Encounter: Payer: Self-pay | Admitting: Internal Medicine

## 2022-01-11 DIAGNOSIS — Z952 Presence of prosthetic heart valve: Secondary | ICD-10-CM

## 2022-01-11 MED ORDER — WARFARIN SODIUM 5 MG PO TABS: 2.5000 mg | ORAL_TABLET | ORAL | 1 refills | Status: DC

## 2022-01-11 MED ORDER — AMIODARONE HCL 200 MG PO TABS: 200.0000 mg | ORAL_TABLET | Freq: Every day | ORAL | 3 refills | Status: DC

## 2022-01-11 MED ORDER — AMIODARONE HCL 200 MG PO TABS: ORAL_TABLET | ORAL | 0 refills | Status: DC

## 2022-01-11 NOTE — Telephone Encounter (Signed)
Pt's medication was resent to pt's pharmacy as requested. Confirmation received.  °

## 2022-01-11 NOTE — Addendum Note (Signed)
Addended by: Malen Gauze on: 01/11/2022 09:31 AM   Modules accepted: Orders

## 2022-01-11 NOTE — Telephone Encounter (Signed)
Refill request for warfarin:  Last INR was 2.2 on 01/05/22 Next INR due on 01/26/22 LOV was 11/07/21  Olin Pia MD  Refill approved.

## 2022-01-11 NOTE — Telephone Encounter (Signed)
*  STAT* If patient is at the pharmacy, call can be transferred to refill team.   1. Which medications need to be refilled? (please list name of each medication and dose if known) amiodarone (PACERONE) 200 MG tablet  2. Which pharmacy/location (including street and city if local pharmacy) is medication to be sent to? [id number] - [place], [place] - [address] AT [place]  3. Do they need a 30 day or 90 day supply? It was not transmitted, when it was submitted yesterday.

## 2022-01-11 NOTE — Telephone Encounter (Signed)
Patient is following up, requesting a response ASAP regarding trip to [place]. Advised patient Dr. Caryl Comes and Rosann Auerbach will return to the office tomorrow, likely will not hear back before then. Patient understands, but still stresses he needs documentation on why he cannot travel to [place]. States his friend booked his flight and will not be refunded unless they have medical proof that he is unable to travel, per Dr. Olin Pia request. Please assist when able.

## 2022-01-12 ENCOUNTER — Other Ambulatory Visit: Payer: Medicare Other

## 2022-01-12 ENCOUNTER — Telehealth: Payer: Self-pay | Admitting: *Deleted

## 2022-01-12 DIAGNOSIS — R718 Other abnormality of red blood cells: Secondary | ICD-10-CM

## 2022-01-12 DIAGNOSIS — Z5181 Encounter for therapeutic drug level monitoring: Secondary | ICD-10-CM

## 2022-01-12 DIAGNOSIS — I255 Ischemic cardiomyopathy: Secondary | ICD-10-CM

## 2022-01-12 DIAGNOSIS — I4901 Ventricular fibrillation: Secondary | ICD-10-CM

## 2022-01-12 DIAGNOSIS — Z95810 Presence of automatic (implantable) cardiac defibrillator: Secondary | ICD-10-CM

## 2022-01-12 NOTE — Telephone Encounter (Signed)
The patient walked in to office today stating Dr. Caryl Comes wants him to have lab work.  There are no current notes or lab orders.  I reviewed last ov note from April 2023.  Orders were placed at that time for TSH, folate, b12 and liver function.  These have not been collected yet so I re entered them and scheduled an appointment for today for the labs per Dr. Caryl Comes.

## 2022-01-13 LAB — HEPATIC FUNCTION PANEL
ALT: 14 IU/L (ref 0–44)
AST: 29 IU/L (ref 0–40)
Albumin: 4.5 g/dL (ref 3.8–4.8)
Alkaline Phosphatase: 67 IU/L (ref 44–121)
Bilirubin Total: 0.5 mg/dL (ref 0.0–1.2)
Bilirubin, Direct: 0.17 mg/dL (ref 0.00–0.40)
Total Protein: 7 g/dL (ref 6.0–8.5)

## 2022-01-13 LAB — TSH: TSH: 3.94 u[IU]/mL (ref 0.450–4.500)

## 2022-01-13 LAB — B12 AND FOLATE PANEL
Folate: >20 ng/mL (ref >3.0)
Vitamin B-12: 700 pg/mL (ref 232–1245)

## 2022-01-18 ENCOUNTER — Ambulatory Visit (INDEPENDENT_AMBULATORY_CARE_PROVIDER_SITE_OTHER): Payer: Medicare Other | Admitting: *Deleted

## 2022-01-18 DIAGNOSIS — Z952 Presence of prosthetic heart valve: Secondary | ICD-10-CM

## 2022-01-18 DIAGNOSIS — I4892 Unspecified atrial flutter: Secondary | ICD-10-CM | POA: Diagnosis not present

## 2022-01-18 DIAGNOSIS — Z5181 Encounter for therapeutic drug level monitoring: Secondary | ICD-10-CM

## 2022-01-18 LAB — POCT INR: INR: 2.4 (ref 2.0–3.0)

## 2022-01-18 NOTE — Patient Instructions (Signed)
Increased Kale in his diet. Been taking warfarin 1/2 tablet on M,W,F while on Amiodarone '400mg'$  twice daily Continue warfarin 1 tablet daily except 1/2 tablet on Mondays, Wednesdays and Fridays Will decrease amiodarone to '200mg'$  twice daily tomorrow Recheck in 2 wks Coumadin Clinic [phone]

## 2022-02-01 ENCOUNTER — Ambulatory Visit: Payer: Medicare Other | Attending: Internal Medicine | Admitting: *Deleted

## 2022-02-01 DIAGNOSIS — Z952 Presence of prosthetic heart valve: Secondary | ICD-10-CM | POA: Diagnosis not present

## 2022-02-01 DIAGNOSIS — I4819 Other persistent atrial fibrillation: Secondary | ICD-10-CM | POA: Diagnosis not present

## 2022-02-01 DIAGNOSIS — Z5181 Encounter for therapeutic drug level monitoring: Secondary | ICD-10-CM

## 2022-02-01 LAB — POCT INR: INR: 2.4 (ref 2.0–3.0)

## 2022-02-01 NOTE — Patient Instructions (Signed)
Continue warfarin 1 tablet daily except 1/2 tablet on Mondays, Wednesdays and Fridays Will decrease amiodarone to '100mg'$  twice daily tomorrow Recheck in 2 wks Coumadin Clinic [phone]

## 2022-02-02 ENCOUNTER — Ambulatory Visit (INDEPENDENT_AMBULATORY_CARE_PROVIDER_SITE_OTHER): Payer: Medicare Other

## 2022-02-02 DIAGNOSIS — I255 Ischemic cardiomyopathy: Secondary | ICD-10-CM

## 2022-02-02 LAB — CUP PACEART REMOTE DEVICE CHECK
Battery Remaining Longevity: 66 mo
Battery Remaining Percentage: 94 %
Brady Statistic RA Percent Paced: 0 %
Brady Statistic RV Percent Paced: 94 %
Date Time Interrogation Session: 20230907012000
HighPow Impedance: 62 Ohm
Implantable Lead Implant Date: 20180105
Implantable Lead Implant Date: 20180105
Implantable Lead Implant Date: 20180105
Implantable Lead Location: 753858
Implantable Lead Location: 753859
Implantable Lead Location: 753860
Implantable Lead Model: 292
Implantable Lead Model: 4672
Implantable Lead Model: 7741
Implantable Lead Serial Number: 428973
Implantable Lead Serial Number: 801088
Implantable Lead Serial Number: 849519
Implantable Pulse Generator Implant Date: 20180105
Lead Channel Impedance Value: 652 Ohm
Lead Channel Impedance Value: 722 Ohm
Lead Channel Impedance Value: 745 Ohm
Lead Channel Pacing Threshold Amplitude: 0.7 V
Lead Channel Pacing Threshold Amplitude: 1 V
Lead Channel Pacing Threshold Amplitude: 1.3 V
Lead Channel Pacing Threshold Pulse Width: 0.4 ms
Lead Channel Pacing Threshold Pulse Width: 0.4 ms
Lead Channel Pacing Threshold Pulse Width: 1 ms
Lead Channel Setting Pacing Amplitude: 2 V
Lead Channel Setting Pacing Amplitude: 2 V
Lead Channel Setting Pacing Amplitude: 2.2 V
Lead Channel Setting Pacing Pulse Width: 0.4 ms
Lead Channel Setting Pacing Pulse Width: 1 ms
Lead Channel Setting Sensing Sensitivity: 0.6 mV
Lead Channel Setting Sensing Sensitivity: 1 mV
Pulse Gen Serial Number: 180139

## 2022-02-07 ENCOUNTER — Encounter: Payer: Self-pay | Admitting: Internal Medicine

## 2022-02-07 NOTE — Telephone Encounter (Signed)
Called patient back to let him know, Oda Kilts, PA's advisement. Patient will continue with Amiodarone 200 mg by mouth daily. Patient does not know how to send transmission. Will send message to device clinic to call patient to help.

## 2022-02-14 ENCOUNTER — Ambulatory Visit: Payer: Medicare Other | Attending: Internal Medicine | Admitting: *Deleted

## 2022-02-14 DIAGNOSIS — I4892 Unspecified atrial flutter: Secondary | ICD-10-CM | POA: Diagnosis not present

## 2022-02-14 DIAGNOSIS — Z952 Presence of prosthetic heart valve: Secondary | ICD-10-CM | POA: Diagnosis not present

## 2022-02-14 DIAGNOSIS — Z5181 Encounter for therapeutic drug level monitoring: Secondary | ICD-10-CM | POA: Diagnosis not present

## 2022-02-14 LAB — POCT INR: INR: 2.7 (ref 2.0–3.0)

## 2022-02-14 NOTE — Patient Instructions (Signed)
Continue warfarin 1 tablet daily except 1/2 tablet on Mondays, Wednesdays and Fridays Taking amiodarone '100mg'$  twice daily  Recheck in 2 wks Coumadin Clinic [phone]

## 2022-02-18 NOTE — Progress Notes (Signed)
Remote ICD transmission.   

## 2022-03-15 ENCOUNTER — Other Ambulatory Visit (HOSPITAL_COMMUNITY): Payer: Self-pay | Admitting: Cardiology

## 2022-03-15 ENCOUNTER — Ambulatory Visit: Payer: Medicare Other | Attending: Internal Medicine | Admitting: *Deleted

## 2022-03-15 DIAGNOSIS — Z5181 Encounter for therapeutic drug level monitoring: Secondary | ICD-10-CM

## 2022-03-15 DIAGNOSIS — Z952 Presence of prosthetic heart valve: Secondary | ICD-10-CM

## 2022-03-15 LAB — POCT INR: INR: 2.3 (ref 2.0–3.0)

## 2022-03-15 NOTE — Patient Instructions (Signed)
Continue warfarin 1 tablet daily except 1/2 tablet on Mondays, Wednesdays and Fridays Taking amiodarone '100mg'$  twice daily  Recheck in 4 wks Coumadin Clinic [phone]

## 2022-03-29 ENCOUNTER — Ambulatory Visit: Payer: Medicare Other | Attending: Internal Medicine | Admitting: *Deleted

## 2022-03-29 DIAGNOSIS — Z952 Presence of prosthetic heart valve: Secondary | ICD-10-CM

## 2022-03-29 DIAGNOSIS — Z5181 Encounter for therapeutic drug level monitoring: Secondary | ICD-10-CM | POA: Diagnosis not present

## 2022-03-29 LAB — POCT INR: INR: 4.8 — AB (ref 2.0–3.0)

## 2022-03-29 NOTE — Patient Instructions (Signed)
Hold warfarin tonight and tomorrow night then resume 1 tablet daily except 1/2 tablet on Mondays, Wednesdays and Fridays Taking amiodarone '100mg'$  once daily  Recheck in 2 wks Coumadin Clinic [phone]

## 2022-04-01 NOTE — Progress Notes (Unsigned)
[name] [address] [place] [address]   CLINIC:  Medical Oncology/Hematology  CONSULT NOTE  Patient Care Team: Michell Heinrich, DO as PCP - General (Family Medicine) Annia Belt, MD as Consulting Physician (Oncology) Bensimhon, Shaune Pascal, MD as Consulting Physician (Cardiology)  CHIEF COMPLAINTS/PURPOSE OF CONSULTATION:  Thrombocytosis  HISTORY OF PRESENTING ILLNESS:  Taylor [name] 71 y.o. male is here at the request of his PCP (Dr. Marveen Reeks) for evaluation of elevated platelets.    Patient was previously seen at the [facility] by Dr. Beryle Beams, was lost to follow-up after September 2019.  Patient was diagnosed with essential thrombocytosis in January 2009 when he presented with recurrent episodes of memory loss and dizziness.  MRI at that time showed probable lacunar infarcts in the right thalamus and left caudate body, and elevated platelet count was noted at that time.  He was referred to hematologist and started on aspirin and hydroxyurea, with resolution of his neurologic symptoms.  JAK2 mutation status not known to the patient, but he does report having a bone marrow biopsy at the time of diagnosis, performed at [facility] (Dr. Sadie Haber).  He has been taking Hydrea since diagnosis in 2009.  Labs sent by PCP (03/15/2022) significant for platelets 668, normal Hgb 14.9 but with macrocytosis (MCV 111.5).   He is tolerating Hydrea well, currently taking alternating doses 1000 mg / 500 mg daily.  CBC shows expected macrocytosis.  Patient denies cutaneous ulcers, mouth sores, or GI side effects.  His energy levels have been stable at baseline.   Patient has history of heart attack x2 (1994 and 2017).  He denies any history of stroke, DVT, or PE.  No aquagenic pruritus, Raynaud's phenomenon, or erythromelalgia.  He does report that he has had intermittent episodes of dizziness that were only occurring while driving - he later found out that  his antifreeze had been leaking, and after having this fixed he no longer experienced dizziness.  He denies any tinnitus, blurry vision, strokelike symptoms.  He denies any peripheral neuropathy, but does sometimes notice numbness in unilateral arm while sleeping.  No fever, chills, night sweats, or unintentional weight loss.  Denies any abdominal pain, nausea, early satiety.  He reports 85% energy and 100% appetite.  His past medical history is significant for significant heart disease, including atrial fibrillation/flutter (chronic anticoagulation with warfarin), mechanical aortic valve (Coumadin), hyperlipidemia, systolic CHF, hypothyroidism, coronary artery disease with heart attacks in 1994 in 2017.  Most recent colonoscopy in 2019.  Patient is retired from a job building race cars.  He is a former smoker, smoked 1 PPD x33 years, quit smoking in 2017.  He reports daily alcohol consumption (2 beers +1 glass of wine).  He has prior history of substance abuse with cocaine and benzodiazepine, which led to nonischemic cardiomyopathy and aortic valve failure, therefore quit in 1994.  He reports family history positive for maternal uncle with colon cancer as well as paternal aunt with colon cancer.   MEDICAL HISTORY:  Past Medical History:  Diagnosis Date   Atherosclerotic heart disease of native coronary artery without angina pectoris    Chronic anticoagulation 8/56/3149   Chronic systolic heart failure ([place]) 02/02/2017   Essential thrombocythemia ([place]) 02/15/2017   Hyperlipidemia    Hypertension    Ischemic cardiomyopathy    Leg pain, central, left 04/17/2017   Hx sciatica - pain similar; no red flag signs on hx or exam    Presence of other  heart-valve replacement    Ventricular tachycardia ([place])     SURGICAL HISTORY: Past Surgical History:  Procedure Laterality Date   BIV ICD INSERTION CRT-D     CARDIOVERSION N/A 10/21/2020   Procedure: CARDIOVERSION;  Surgeon: Lelon Perla, MD;   Location: [facility] ENDOSCOPY;  Service: Cardiovascular;  Laterality: N/A;   CARDIOVERSION N/A 09/22/2021   Procedure: CARDIOVERSION;  Surgeon: Sueanne Margarita, MD;  Location: [facility] ENDOSCOPY;  Service: Cardiovascular;  Laterality: N/A;   CORONARY ARTERY BYPASS GRAFT  2017   MECHANICAL AORTIC VALVE REPLACEMENT     TEE WITHOUT CARDIOVERSION N/A 10/21/2020   Procedure: TRANSESOPHAGEAL ECHOCARDIOGRAM (TEE);  Surgeon: Lelon Perla, MD;  Location: [facility] ENDOSCOPY;  Service: Cardiovascular;  Laterality: N/A;    SOCIAL HISTORY: Social History   Socioeconomic History   Marital status: Divorced    Spouse name: Not on file   Number of children: Not on file   Years of education: Not on file   Highest education level: Not on file  Occupational History   Not on file  Tobacco Use   Smoking status: Former    Packs/day: 1.00    Years: 30.00    Total pack years: 30.00    Types: Cigarettes    Start date: 05/26/1979    Quit date: 05/18/2016    Years since quitting: 5.8   Smokeless tobacco: Never  Vaping Use   Vaping Use: Never used  Substance and Sexual Activity   Alcohol use: Yes    Alcohol/week: 10.0 standard drinks of alcohol    Types: 10 Cans of beer per week    Comment: 1 can a day..   Drug use: No   Sexual activity: Not on file  Other Topics Concern   Not on file  Social History Narrative   Not on file   Social Determinants of Health   Financial Resource Strain: Not on file  Food Insecurity: Not on file  Transportation Needs: Not on file  Physical Activity: Not on file  Stress: Not on file  Social Connections: Not on file  Intimate Partner Violence: Not on file    FAMILY HISTORY: Family History  Problem Relation Age of Onset   Hypertension Father     ALLERGIES:  is allergic to erythromycin, rosuvastatin calcium, simvastatin, and zithromax [azithromycin].  MEDICATIONS:  Current Outpatient Medications  Medication Sig Dispense Refill   amiodarone (PACERONE) 200 MG tablet Take  413m - 2 tablets by mouth twice daily x 2 weeks then reduce to 4086m- 2 tablets once daily x 2 weeks (Patient taking differently: Take 100 mg by mouth 2 (two) times daily. Take 100102mwice daily) 84 tablet 0   amiodarone (PACERONE) 200 MG tablet Take 1 tablet (200 mg total) by mouth daily. 90 tablet 3   amoxicillin (AMOXIL) 500 MG capsule Take 4 capsules (2.000m74mne hour before dental procedures. 4 capsule 0   Ascorbic Acid ([facility] VITAMIN C PO) Take 1 tablet by mouth daily. 60 mg Vit C and 15 mg Zinc ( combo)     aspirin 81 MG tablet Take 81 mg by mouth daily.     benzonatate (TESSALON) 100 MG capsule Take 100 mg by mouth 3 (three) times daily as needed for cough. (Patient not taking: Reported on 11/07/2021)     carvedilol (COREG) 3.125 MG tablet TAKE 1 TABLET(3.125 MG) BY MOUTH TWICE DAILY WITH A MEAL 180 tablet 3   cholecalciferol (VITAMIN D3) 25 MCG (1000 UNIT) tablet Take 1,000 Units by mouth daily.  dapagliflozin propanediol (FARXIGA) 10 MG TABS tablet Take 1 tablet (10 mg total) by mouth daily. 90 tablet 3   hydroxyurea (HYDREA) 500 MG capsule TAKE 1 CAPSULE BY MOUTH ALTERNATING WITH 2 CAPSULES DAILY(500 MG ONE DAY AND 1000 MG NEXT DAY). (Patient taking differently: Take 500-1,000 mg by mouth See admin instructions. Taking 520m one day then 2 capsules ( 10042m the next day) 135 capsule 3   levothyroxine (SYNTHROID) 75 MCG tablet Take 75 mcg by mouth daily before breakfast.     sacubitril-valsartan (ENTRESTO) 24-26 MG Take 1 tablet by mouth daily at 6 (six) AM. 180 tablet 3   sildenafil (REVATIO) 20 MG tablet Take 20 mg by mouth daily as needed (ED).     spironolactone (ALDACTONE) 25 MG tablet TAKE 1/2 TABLET(12.5 MG) BY MOUTH DAILY 45 tablet 1   vitamin B-12 (CYANOCOBALAMIN) 1000 MCG tablet Take 1,000 mcg by mouth daily.     warfarin (COUMADIN) 5 MG tablet Take 0.5-1 tablets (2.5-5 mg total) by mouth See admin instructions. Take 1 tablet daily except 1/2 tablet on Mondays and Fridays 90  tablet 1   No current facility-administered medications for this visit.    REVIEW OF SYSTEMS: Review of Systems  Constitutional:  Negative for appetite change, chills, diaphoresis, fatigue, fever and unexpected weight change.  HENT:   Negative for lump/mass and nosebleeds.   Eyes:  Negative for eye problems.  Respiratory:  Negative for cough, hemoptysis and shortness of breath.   Cardiovascular:  Negative for chest pain, leg swelling and palpitations.  Gastrointestinal:  Negative for abdominal pain, blood in stool, constipation, diarrhea, nausea and vomiting.  Genitourinary:  Negative for hematuria.   Skin: Negative.   Neurological:  Positive for dizziness and headaches. Negative for light-headedness.  Hematological:  Does not bruise/bleed easily.  Psychiatric/Behavioral:  Positive for sleep disturbance.       PHYSICAL EXAMINATION: ECOG PERFORMANCE STATUS: 0 - Asymptomatic  There were no vitals filed for this visit. There were no vitals filed for this visit.  Physical Exam Constitutional:      Appearance: Normal appearance.  HENT:     Head: Normocephalic and atraumatic.     Mouth/Throat:     Mouth: Mucous membranes are moist.  Eyes:     Extraocular Movements: Extraocular movements intact.     Pupils: Pupils are equal, round, and reactive to light.  Cardiovascular:     Rate and Rhythm: Normal rate and regular rhythm.     Pulses: Normal pulses.     Heart sounds: Normal heart sounds.     Comments: Auscultation of mechanical click over aortic valve Pulmonary:     Effort: Pulmonary effort is normal.     Breath sounds: Normal breath sounds.  Abdominal:     General: Bowel sounds are normal.     Palpations: Abdomen is soft.     Tenderness: There is no abdominal tenderness.  Musculoskeletal:        General: No swelling.     Right lower leg: No edema.     Left lower leg: No edema.  Lymphadenopathy:     Cervical: No cervical adenopathy.  Skin:    General: Skin is warm and  dry.  Neurological:     General: No focal deficit present.     Mental Status: He is alert and oriented to person, place, and time.  Psychiatric:        Mood and Affect: Mood normal.        Behavior: Behavior normal.  LABORATORY DATA:  I have reviewed the data as listed Recent Results (from the past 2160 hour(s))  POCT INR     Status: Normal   Collection Time: 01/05/22  2:47 PM  Result Value Ref Range   INR 2.2 2.0 - 3.0  TSH     Status: None   Collection Time: 01/12/22 10:43 AM  Result Value Ref Range   TSH 3.940 0.450 - 4.500 uIU/mL  Hepatic function panel     Status: None   Collection Time: 01/12/22 10:43 AM  Result Value Ref Range   Total Protein 7.0 6.0 - 8.5 g/dL   Albumin 4.5 3.8 - 4.8 g/dL   Bilirubin Total 0.5 0.0 - 1.2 mg/dL   Bilirubin, Direct 0.17 0.00 - 0.40 mg/dL   Alkaline Phosphatase 67 44 - 121 IU/L   AST 29 0 - 40 IU/L   ALT 14 0 - 44 IU/L  B12 and Folate Panel     Status: None   Collection Time: 01/12/22 10:43 AM  Result Value Ref Range   Vitamin B-12 700 232 - 1,245 pg/mL   Folate >20.0 >3.0 ng/mL    Comment: A serum folate concentration of less than 3.1 ng/mL is considered to represent clinical deficiency.   POCT INR     Status: Normal   Collection Time: 01/18/22  2:49 PM  Result Value Ref Range   INR 2.4 2.0 - 3.0  POCT INR     Status: Normal   Collection Time: 02/01/22  3:12 PM  Result Value Ref Range   INR 2.4 2.0 - 3.0  CUP PACEART REMOTE DEVICE CHECK     Status: None   Collection Time: 02/02/22  1:20 AM  Result Value Ref Range   Date Time Interrogation Session [id number]    Pulse Generator Manufacturer BOST    Pulse Gen Model G158 DYNAGEN X4 CRT-D    Pulse Gen Serial Number [id number]    Clinic Name [facility]    Implantable Pulse Generator Type Cardiac Resynch Therapy Defibulator    Implantable Pulse Generator Implant Date [id number]    Implantable Lead Manufacturer BOST    Implantable Lead Model 0292 [place] Reliance 4-Site  SG    Implantable Lead Serial Number [id number]    Implantable Lead Implant Date 09381829    Implantable Lead Location Detail 1 UNKNOWN    Implantable Lead Location [id number]    Implantable Lead Manufacturer BOST    Implantable Lead Model [id number] Acuity X4 Straight    Implantable Lead Serial Number [id number]    Implantable Lead Implant Date 93716967    Implantable Lead Location Detail 1 UNKNOWN    Implantable Lead Location [id number]    Implantable Lead Manufacturer BOST    Implantable Lead Model 7741 Ingevity MRI    Implantable Lead Serial Number [id number]    Implantable Lead Implant Date 89381017    Implantable Lead Location Detail 1 UNKNOWN    Implantable Lead Location [phone]    Lead Channel Setting Sensing Sensitivity 0.6 mV   Lead Channel Setting Sensing Adaptation Mode Adaptive Sensing    Lead Channel Setting Sensing Sensitivity 1.0 mV   Lead Channel Setting Sensing Adaptation Mode Adaptive Sensing    Lead Channel Setting Pacing Amplitude 2.0 V   Lead Channel Setting Pacing Pulse Width 0.4 ms   Lead Channel Setting Pacing Amplitude 2.0 V   Lead Channel Setting Pacing Pulse Width 1.0 ms   Lead Channel Setting Pacing Amplitude 2.2 V   Lead Channel Impedance Value 652 ohm  Lead Channel Pacing Threshold Amplitude 1.3 V   Lead Channel Pacing Threshold Pulse Width 1.0 ms   Lead Channel Impedance Value 745 ohm   Lead Channel Pacing Threshold Amplitude 1.0 V   Lead Channel Pacing Threshold Pulse Width 0.4 ms   Lead Channel Impedance Value 722 ohm   Lead Channel Pacing Threshold Amplitude 0.7 V   Lead Channel Pacing Threshold Pulse Width 0.4 ms   HighPow Impedance 62 ohm   Battery Status BOS    Battery Remaining Longevity 66 mo   Battery Remaining Percentage 94 %   Brady Statistic RA Percent Paced 0 %   Brady Statistic RV Percent Paced 94 %  POCT INR     Status: Normal   Collection Time: 02/14/22  3:23 PM  Result Value Ref Range   INR 2.7 2.0 - 3.0  POCT INR     Status: Normal   Collection  Time: 03/15/22  2:39 PM  Result Value Ref Range   INR 2.3 2.0 - 3.0   POC INR    POCT INR     Status: Abnormal   Collection Time: 03/29/22  3:56 PM  Result Value Ref Range   INR 4.8 (A) 2.0 - 3.0   POC INR      RADIOGRAPHIC STUDIES: I have personally reviewed the radiological images as listed and agreed with the findings in the report. No results found.  ASSESSMENT & PLAN: 1.  Essential thrombocytosis - Patient referred by PCP (Dr. Marveen Reeks) to reestablish care for treatment of essential thrombocytosis - Patient was previously seen at the [facility] by Dr. Beryle Beams, was lost to follow-up after September 2019. - Diagnosed with essential thrombocytosis in January 2009.  Presented with neurologic symptoms and MRI showed probable lacunar infarcts.  Elevated platelets were noted at that time, patient was referred to hematologist who performed bone marrow biopsy and started patient on hydroxyurea.   - JAK2 status unknown to patient - Currently taking hydroxyurea 500 mg daily alternating with 1000 mg daily. - Patient takes Coumadin (mechanical aortic valve) and aspirin 81 mg daily - She has history of heart attack x2.  No history of DVT, PE, or CVA. - No aquagenic pruritus, erythromelalgia, or vasomotor symptoms.  No B symptoms. - Most recent labs: Platelets 668 (labs by PCP on 03/15/2022), platelets 589 (labs at [facility] 12/20/2021) - PLAN: We will check mutational status with JAK2 with reflex to CALR/MPL.   - CBC/D, CMP, LDH today - Recommend increased dose of Hydrea to 1000 MG DAILY - Labs (CBC/D, LDH, CMP) with RTC in 6 weeks   2.  OTHER HISTORY - PMH: Atrial fibrillation/flutter (chronic anticoagulation with warfarin), mechanical aortic valve (Coumadin), hyperlipidemia, systolic CHF, hypothyroidism, coronary artery disease with heart attacks in 1994 in 2017.  Most recent colonoscopy in 2019.  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  He is a former smoker, smoked 1 PPD  x33 years, quit smoking in 2017.  He reports daily alcohol consumption (2 beers +1 glass of wine).  He has prior history of substance abuse with cocaine and benzodiazepine, which led to nonischemic cardiomyopathy and aortic valve failure, therefore quit in 1994.  - FAMILY: Maternal uncle with colon cancer.  Paternal aunt with colon cancer.   All questions were answered. The patient knows to call the clinic with any problems, questions or concerns.   Medical decision making: Moderate  Time spent on visit: I spent 40 minutes counseling the patient face to face. The  total time spent in the appointment was 55 minutes and more than 50% was on counseling.  I, Tarri Abernethy PA-C, have seen this patient in conjunction with Dr. Derek Jack.  Greater than 50% of visit was performed by Dr. Delton Coombes.   Harriett Rush, PA-C 04/03/2022 10:54 AM  DR. Rainbow Salman: I have independently evaluated this patient and formulated my assessment and plan.  I agree with HPI, assessment and plan written by Casey Burkitt, PA-C.  Patient was reportedly diagnosed with essential thrombocytosis in 2009 in [facility] and underwent a bone marrow biopsy.  He was started on hydroxyurea since then.  He was last evaluated by Dr. Beryle Beams 01/2018.  He was receiving hydroxyurea through his primary doctor and was taking it at 500 mg alternating with 1000 mg daily.  He does not have any vasomotor symptoms or aquagenic pruritus.  Most recently his platelet count was ranging between 500-686 since January 2022.  No prior history of thrombosis.  He had MI but no CVA.  Recommend checking his JAK2 V617F mutation status.  Recommend increasing hydroxyurea to 1000 mg daily.  RTC 6 weeks with repeat labs.

## 2022-04-03 ENCOUNTER — Inpatient Hospital Stay: Payer: Medicare Other

## 2022-04-03 ENCOUNTER — Inpatient Hospital Stay: Payer: Medicare Other | Attending: Hematology | Admitting: Hematology

## 2022-04-03 VITALS — BP 113/65 | HR 70 | Temp 97.4°F | Resp 18 | Ht 69.0 in | Wt 165.0 lb

## 2022-04-03 DIAGNOSIS — Z952 Presence of prosthetic heart valve: Secondary | ICD-10-CM | POA: Diagnosis not present

## 2022-04-03 DIAGNOSIS — Z7901 Long term (current) use of anticoagulants: Secondary | ICD-10-CM | POA: Insufficient documentation

## 2022-04-03 DIAGNOSIS — D473 Essential (hemorrhagic) thrombocythemia: Secondary | ICD-10-CM | POA: Diagnosis present

## 2022-04-03 DIAGNOSIS — Z87891 Personal history of nicotine dependence: Secondary | ICD-10-CM | POA: Insufficient documentation

## 2022-04-03 DIAGNOSIS — D75839 Thrombocytosis, unspecified: Secondary | ICD-10-CM | POA: Diagnosis present

## 2022-04-03 LAB — CBC WITH DIFFERENTIAL/PLATELET
Abs Immature Granulocytes: 0.03 10*3/uL (ref 0.00–0.07)
Basophils Absolute: 0.1 10*3/uL (ref 0.0–0.1)
Basophils Relative: 2 %
Eosinophils Absolute: 0.1 10*3/uL (ref 0.0–0.5)
Eosinophils Relative: 2 %
HCT: 44.3 % (ref 39.0–52.0)
Hemoglobin: 15 g/dL (ref 13.0–17.0)
Immature Granulocytes: 1 %
Lymphocytes Relative: 9 %
Lymphs Abs: 0.5 10*3/uL — ABNORMAL LOW (ref 0.7–4.0)
MCH: 39.6 pg — ABNORMAL HIGH (ref 26.0–34.0)
MCHC: 33.9 g/dL (ref 30.0–36.0)
MCV: 116.9 fL — ABNORMAL HIGH (ref 80.0–100.0)
Monocytes Absolute: 0.3 10*3/uL (ref 0.1–1.0)
Monocytes Relative: 6 %
Neutro Abs: 4 10*3/uL (ref 1.7–7.7)
Neutrophils Relative %: 80 %
Platelets: 563 10*3/uL — ABNORMAL HIGH (ref 150–400)
RBC: 3.79 MIL/uL — ABNORMAL LOW (ref 4.22–5.81)
RDW: 14 % (ref 11.5–15.5)
WBC: 5 10*3/uL (ref 4.0–10.5)
nRBC: 0 % (ref 0.0–0.2)

## 2022-04-03 LAB — COMPREHENSIVE METABOLIC PANEL
ALT: 16 U/L (ref 0–44)
AST: 27 U/L (ref 15–41)
Albumin: 3.9 g/dL (ref 3.5–5.0)
Alkaline Phosphatase: 60 U/L (ref 38–126)
Anion gap: 6 (ref 5–15)
BUN: 21 mg/dL (ref 8–23)
CO2: 31 mmol/L (ref 22–32)
Calcium: 9.2 mg/dL (ref 8.9–10.3)
Chloride: 102 mmol/L (ref 98–111)
Creatinine, Ser: 1.22 mg/dL (ref 0.61–1.24)
GFR, Estimated: >60 mL/min (ref >60)
Glucose, Bld: 96 mg/dL (ref 70–99)
Potassium: 4.9 mmol/L (ref 3.5–5.1)
Sodium: 139 mmol/L (ref 135–145)
Total Bilirubin: 0.8 mg/dL (ref 0.3–1.2)
Total Protein: 7.2 g/dL (ref 6.5–8.1)

## 2022-04-03 LAB — HEMOGLOBIN A1C
Hgb A1c MFr Bld: 5.1 % (ref 4.8–5.6)
Mean Plasma Glucose: 99.67 mg/dL

## 2022-04-03 LAB — LACTATE DEHYDROGENASE: LDH: 257 U/L — ABNORMAL HIGH (ref 98–192)

## 2022-04-03 LAB — CORTISOL: Cortisol, Plasma: 9.9 ug/dL

## 2022-04-03 MED ORDER — HYDROXYUREA 500 MG PO CAPS: 1000.0000 mg | ORAL_CAPSULE | Freq: Every day | ORAL | 2 refills | Status: DC

## 2022-04-03 NOTE — Patient Instructions (Signed)
[facility] [place] [place] **   You were seen today by Dr. Delton Coombes and Tarri Abernethy PA-C for your essential thrombocytosis.   We will INCREASE your Hydrea to 1000 mg (2 tablets) daily. We will check labs today. We will check additional labs and see you for office visit in 6 weeks.  MEDICATIONS: Hydrea 1000 mg daily  FOLLOW-UP APPOINTMENT: Same-day labs and office visit in 6 weeks  ** Thank you for trusting me with your healthcare!  I strive to provide all of my patients with quality care at each visit.  If you receive a survey for this visit, I would be so grateful to you for taking the time to provide feedback.  Thank you in advance!  ~ Mahrosh Donnell                   Dr. Derek Jack   &   Tarri Abernethy, PA-C   - - - - - - - - - - - - - - - - - -    Thank you for choosing [facility] to provide your oncology and hematology care.  To afford each patient quality time with our provider, please arrive at least 15 minutes before your scheduled appointment time.   If you have a lab appointment with the [facility] please come in thru the Main Entrance and check in at the main information desk.  You need to re-schedule your appointment should you arrive 10 or more minutes late.  We strive to give you quality time with our providers, and arriving late affects you and other patients whose appointments are after yours.  Also, if you no show three or more times for appointments you may be dismissed from the clinic at the providers discretion.     Again, thank you for choosing [facility].  Our hope is that these requests will decrease the amount of time that you wait before being seen by our physicians.       _____________________________________________________________  Should you have questions after your visit to [facility], please contact our office at  [phone] and follow the prompts.  Our office hours are 8:00 a.m. and 4:30 p.m. Monday - Friday.  Please note that voicemails left after 4:00 p.m. may not be returned until the following business day.  We are closed weekends and major holidays.  You do have access to a nurse 24-7, just call the main number to the clinic [phone] and do not press any options, hold on the line and a nurse will answer the phone.    For prescription refill requests, have your pharmacy contact our office and allow 72 hours.

## 2022-04-11 LAB — JAK2 V617F RFX CALR/MPL/E12-15: JAK2 V617F %: 41.73 %

## 2022-04-12 ENCOUNTER — Ambulatory Visit: Payer: Medicare Other | Attending: Internal Medicine | Admitting: *Deleted

## 2022-04-12 DIAGNOSIS — Z952 Presence of prosthetic heart valve: Secondary | ICD-10-CM | POA: Diagnosis not present

## 2022-04-12 DIAGNOSIS — Z5181 Encounter for therapeutic drug level monitoring: Secondary | ICD-10-CM

## 2022-04-12 LAB — POCT INR: INR: 2.9 (ref 2.0–3.0)

## 2022-04-12 NOTE — Patient Instructions (Signed)
Continue warfarin 1 tablet daily except 1/2 tablet on Mondays, Wednesdays and Fridays Taking amiodarone '100mg'$  once daily  Recheck in 3 wks Coumadin Clinic [phone]

## 2022-04-13 ENCOUNTER — Telehealth: Payer: Self-pay

## 2022-04-13 NOTE — Telephone Encounter (Signed)
CV Remote Solution Alert Received.   Alert remote reviewed. Normal device function.   There were 8 NSVT arrhythmias detected.  There was one VT arrhythmia that was successfully converted with one burst of ATP, sent to triage. Next remote 05/04/2022 and in clinic 04/14/2022.   Attempted to contact patient to assess s/s and medication compliance. No answer, LMTCB. Patient has upcoming apt with [facility] 04/14/22.

## 2022-04-14 ENCOUNTER — Ambulatory Visit: Payer: Medicare Other | Attending: Physician Assistant | Admitting: Physician Assistant

## 2022-04-14 ENCOUNTER — Encounter: Payer: Self-pay | Admitting: Physician Assistant

## 2022-04-14 VITALS — BP 108/50 | HR 64 | Ht 69.0 in | Wt 160.6 lb

## 2022-04-14 DIAGNOSIS — Z95810 Presence of automatic (implantable) cardiac defibrillator: Secondary | ICD-10-CM

## 2022-04-14 DIAGNOSIS — I5022 Chronic systolic (congestive) heart failure: Secondary | ICD-10-CM

## 2022-04-14 DIAGNOSIS — I472 Ventricular tachycardia, unspecified: Secondary | ICD-10-CM | POA: Diagnosis not present

## 2022-04-14 DIAGNOSIS — I2510 Atherosclerotic heart disease of native coronary artery without angina pectoris: Secondary | ICD-10-CM | POA: Diagnosis not present

## 2022-04-14 DIAGNOSIS — I255 Ischemic cardiomyopathy: Secondary | ICD-10-CM

## 2022-04-14 DIAGNOSIS — Z952 Presence of prosthetic heart valve: Secondary | ICD-10-CM | POA: Diagnosis not present

## 2022-04-14 DIAGNOSIS — I483 Typical atrial flutter: Secondary | ICD-10-CM

## 2022-04-14 LAB — CUP PACEART INCLINIC DEVICE CHECK
Date Time Interrogation Session: 20231117171951
Implantable Lead Connection Status: 753985
Implantable Lead Connection Status: 753985
Implantable Lead Connection Status: 753985
Implantable Lead Implant Date: 20180105
Implantable Lead Implant Date: 20180105
Implantable Lead Implant Date: 20180105
Implantable Lead Location: 753858
Implantable Lead Location: 753859
Implantable Lead Location: 753860
Implantable Lead Model: 292
Implantable Lead Model: 4672
Implantable Lead Model: 7741
Implantable Lead Serial Number: 428973
Implantable Lead Serial Number: 801088
Implantable Lead Serial Number: 849519
Implantable Pulse Generator Implant Date: 20180105
Lead Channel Pacing Threshold Amplitude: 0.6 V
Lead Channel Pacing Threshold Amplitude: 0.9 V
Lead Channel Pacing Threshold Amplitude: 1.4 V
Lead Channel Pacing Threshold Pulse Width: 0.4 ms
Lead Channel Pacing Threshold Pulse Width: 0.4 ms
Lead Channel Pacing Threshold Pulse Width: 1 ms
Lead Channel Sensing Intrinsic Amplitude: 3.5 mV
Pulse Gen Serial Number: 180139

## 2022-04-14 MED ORDER — WARFARIN SODIUM 5 MG PO TABS: 2.5000 mg | ORAL_TABLET | ORAL | 1 refills | Status: DC

## 2022-04-14 MED ORDER — AMIODARONE HCL 200 MG PO TABS: 200.0000 mg | ORAL_TABLET | Freq: Every day | ORAL | 2 refills | Status: DC

## 2022-04-14 NOTE — Telephone Encounter (Signed)
Patient has apt with RCharlcie Cradle today 04/14/22

## 2022-04-14 NOTE — Patient Instructions (Signed)
Medication Instructions:   FOR TWO WEEKS ONLY:  AMIODARONE 200 MG TWICE  A DAY   THE RESUME  AMIODARONE 200 MG ONCE A DAY    *If you need a refill on your cardiac medications before your next appointment, please call your pharmacy*   Lab Work: NONE ORDERED  TODAY   If you have labs (blood work) drawn today and your tests are completely normal, you will receive your results only by: MyChart Message (if you have MyChart) OR A paper copy in the mail If you have any lab test that is abnormal or we need to change your treatment, we will call you to review the results.   Testing/Procedures: NONE ORDERED  TODAY    Follow-Up: At [facility], you and your health needs are our priority.  As part of our continuing mission to provide you with exceptional heart care, we have created designated Provider Care Teams.  These Care Teams include your primary Cardiologist (physician) and Advanced Practice Providers (APPs -  Physician Assistants and Nurse Practitioners) who all work together to provide you with the care you need, when you need it.  We recommend signing up for the patient portal called "MyChart".  Sign up information is provided on this After Visit Summary.  MyChart is used to connect with patients for Virtual Visits (Telemedicine).  Patients are able to view lab/test results, encounter notes, upcoming appointments, etc.  Non-urgent messages can be sent to your provider as well.   To learn more about what you can do with MyChart, go to [email or web address].    Your next appointment:  DR Haroldine Laws /APP NEXT AVAILABLE                          IN 6 WEEKS   The format for your next appointment:   In Person  Provider:   You may see Dr. Lequita Halt or one of the following Advanced Practice Providers on your designated Care Team:   Tommye Standard, [place] Legrand Como "Jonni Sanger" Tillery, PA-C                                                       NO DRIVING FOR  6 MONTHS   Important  Information About Sugar

## 2022-04-14 NOTE — Progress Notes (Signed)
Cardiology Office Note Date:  04/14/2022  Patient ID:  Taylor [name], DOB May 09, 1951, MRN [id number] PCP:  Michell Heinrich, DO  Cardiologist:  Dr. Haroldine Laws Electrophysiologist: Dr. Caryl Comes Dr. Glennon Mac ([facility])    Chief Complaint:  6 mo  History of Present Illness: Taylor [name] is a 71 y.o. male with history of HLD, HTN, hypothyroidism, CAD (CABG and prior PCIs), AAA, HTN, HLD, VHD w/mechanical AVR, ICM, VT/VF arrest (2017), AFlutter (ablated 12/20/21 at [facility]), CHB, h/o polysubstance abuse with cocaine (none further), ETOH and tobacco abuse  Hx of post-op AFib (2017)  He has seen Afib clinic, last 10/28/20, reported known persistent Afib  Hx of: Redo median sternotomy, s/p avr '94;  CORONARY ARTERY BYPASS W/SINGLE ARTERY GRAFT N/A 05/24/2016  CORONARY ARTERY BYPASS, USING ARTERIAL GRAFT(S); SINGLE ARTERIAL GRAFT, Left internal mammary artery harvest; Surgeon: Sande Brothers, MD;   REPLACEMENT AORTIC VALVE N/A 05/24/2016  Redo REPLACEMENT, AORTIC VALVE, OPEN, WITH CARDIOPULMONARY BYPASS, STERNOTOMY; WITH PROSTHETIC VALVE OTHER THAN HOMOGRAFT OR STENTLESS VALVE  Saw Dr. Haroldine Laws 02/03/21, doing well, much improved functional class, discussed GDMT titration limited by dizziness.   He saw Dr. Caryl Comes 11/07/21 via tele-health visit, he discussed known Hx of AFib, on amiodarone for PVCs. He had recurrent AFib by his note and pt requested referral to [facility] for cryoablation specifically, planned to refer to [place].  Labs abnormal,  regarding folate and B12 levels and his last TSH had changed markedly from hypothyroid to euthyroid and need to make sure that he is now not hyperthyroid   Underwent CTI ablation by Dr. Glennon Mac in July 2023  Pt call not to [place] team 01/11/22 with reports of 3 ICD therapies being delivered.   Pt reported he was told by Dr. Olin Pia office, who follows his device, was VT at rate of 180 bpm. Dr. Caryl Comes has ordered bloodwork and increased his amiodarone to 800 mg daily  for one week and then 400 mg daily for one week and then back down to the maintenance dose  Dr. Glennon Mac noted several episodes of NSVT recorded, no ICD shocks in report, just rounds of ATP delivered that converted the arrhythmia. Per Dr. Glennon Mac, he would resume the amiodarone as Dr. Caryl Comes recommended.  September 12/23, pt messages with dizzy spells, tapering his amiodarone >> recommended device transmission  Saw [facility] team 03/17/22, reported dizzy spells as associated with sxposure to fumes, device check and functioning normally.  Arrhythmias:  AHR: 0 VHR: 2 VT episodes- 01/07/22 with ATP x2, 01/09/22 with ATP x3 1 NSVT 01/11/22 lasting 9 beats  They discuss Aflutter ONLY, doing well post CTI ablation No changes were made, recommended continue amio. Planned for [id number]  TODAY He is doing well for the most part. Since fixing his antifreeze leak, his predictable dizziness while in his car has resolved. Though he is still getting some weak spells, a couple started to feel like he may faint. These are more random, not positional He has not had syncope. No CP, no sense of palpitations or cardiac awareness with these or otherwise. No SOB He remains very active, goes to [employer] has to pace himself, but feels like he has good/stable exertional capacity.  He has self reduced his amiodarone dose back to '100mg'$  daily  No bleeding, signs of bleeding, manages warfarin with our [facility]   Device information BSci CRT-D implanted 06/02/2016 + appropriate therapies  AAD hx Amiodarone started 2018, doing has been pulsed at times for VT  Past Medical History:  Diagnosis Date   Atherosclerotic heart disease of native coronary artery without angina pectoris    Chronic anticoagulation 8/33/8250   Chronic systolic heart failure ([place]) 02/02/2017   Essential thrombocythemia ([place]) 02/15/2017   Hyperlipidemia    Hypertension    Ischemic cardiomyopathy    Leg pain, central, left 04/17/2017   Hx  sciatica - pain similar; no red flag signs on hx or exam    Presence of other heart-valve replacement    Ventricular tachycardia (HCC)     Past Surgical History:  Procedure Laterality Date   BIV ICD INSERTION CRT-D     CARDIOVERSION N/A 10/21/2020   Procedure: CARDIOVERSION;  Surgeon: Lelon Perla, MD;  Location: [place];  Service: Cardiovascular;  Laterality: N/A;   CARDIOVERSION N/A 09/22/2021   Procedure: CARDIOVERSION;  Surgeon: Sueanne Margarita, MD;  Location: [place];  Service: Cardiovascular;  Laterality: N/A;   CORONARY ARTERY BYPASS GRAFT  2017   MECHANICAL AORTIC VALVE REPLACEMENT     TEE WITHOUT CARDIOVERSION N/A 10/21/2020   Procedure: TRANSESOPHAGEAL ECHOCARDIOGRAM (TEE);  Surgeon: Lelon Perla, MD;  Location[facility] ENDOSCOPY;  Service: Cardiovascular;  Laterality: N/A;    Current Outpatient Medications  Medication Sig Dispense Refill   amoxicillin (AMOXIL) 500 MG capsule Take 4 capsules (2.'000mg'$ ) one hour before dental procedures. 4 capsule 0   Ascorbic Acid (CVS VITAMIN C PO) Take 1 tablet by mouth daily. 60 mg Vit C and 15 mg Zinc ( combo)     aspirin 81 MG tablet Take 81 mg by mouth daily.     benzonatate (TESSALON) 100 MG capsule Take 100 mg by mouth 3 (three) times daily as needed for cough.     carvedilol (COREG) 3.125 MG tablet TAKE 1 TABLET(3.125 MG) BY MOUTH TWICE DAILY WITH A MEAL 180 tablet 3   cholecalciferol (VITAMIN D3) 25 MCG (1000 UNIT) tablet Take 1,000 Units by mouth daily.     dapagliflozin propanediol (FARXIGA) 10 MG TABS tablet Take 1 tablet (10 mg total) by mouth daily. 90 tablet 3   hydroxyurea (HYDREA) 500 MG capsule Take 2 capsules (1,000 mg total) by mouth daily. May take with food to minimize GI side effects. 180 capsule 2   levocetirizine (XYZAL) 5 MG tablet Take 5 mg by mouth daily.     levothyroxine (SYNTHROID) 75 MCG tablet Take 75 mcg by mouth daily before breakfast.     sacubitril-valsartan (ENTRESTO) 24-26 MG Take 1 tablet by  mouth daily at 6 (six) AM. 180 tablet 3   spironolactone (ALDACTONE) 25 MG tablet TAKE 1/2 TABLET(12.5 MG) BY MOUTH DAILY 45 tablet 1   traZODone (DESYREL) 50 MG tablet Take by mouth.     vitamin B-12 (CYANOCOBALAMIN) 1000 MCG tablet Take 1,000 mcg by mouth daily.     warfarin (COUMADIN) 5 MG tablet Take 0.5-1 tablets (2.5-5 mg total) by mouth See admin instructions. Take 1 tablet daily except 1/2 tablet on Mondays and Fridays 90 tablet 1   amiodarone (PACERONE) 200 MG tablet Take 1 tablet (200 mg total) by mouth daily. 90 tablet 2   No current facility-administered medications for this visit.    Allergies:   Erythromycin, Rosuvastatin calcium, Simvastatin, and Zithromax [azithromycin]   Social History:  The patient  reports that he quit smoking about 5 years ago. His smoking use included cigarettes. He started smoking about 42 years ago. He has a 30.00 pack-year smoking history. He has never used smokeless tobacco. He reports current alcohol use of about 10.0 standard drinks  of alcohol per week. He reports that he does not use drugs.   Family History:  The patient's family history includes Hypertension in his father.  ROS:  Please see the history of present illness.    All other systems are reviewed and otherwise negative.   PHYSICAL EXAM:  VS:  BP (!) 108/50   Pulse 64   Ht '5\' 9"'$  (1.753 m)   Wt 160 lb 9.6 oz (72.8 kg)   SpO2 95%   BMI 23.72 kg/m  BMI: Body mass index is 23.72 kg/m. Well nourished, well developed, in no acute distress HEENT: normocephalic, atraumatic Neck: no JVD, carotid bruits or masses Cardiac:  RRR; valve is appreciated, no rubs, or gallops Lungs:  CTA b/l, no wheezing, rhonchi or rales Abd: soft, nontender MS: no deformity or atrophy Ext: no edema Skin: warm and dry, no rash Neuro:  No gross deficits appreciated Psych: euthymic mood, full affect  ICD site is stable, no tethering or discomfort   EKG:  done today reviewed by myself SR/V paced,  PVC  Device interrogation done today and reviewed by myself:  Battery and lead measurements are stable  03/12/22 he had MMVT treated successfully by one spin of ATP Nov 2 and 5, a few NSVTs   Echo: 10/18/21 1. Akinesis of the inferior and inferolateral walls and remaining walls hypokinetic; overall severe LV dysfunction; S/P AVR with mild AI and normal mean gradient.  2. Left ventricular ejection fraction, by estimation, is 25 to 30%. The left ventricle has severely decreased function. The left ventricle demonstrates regional wall motion abnormalities (see scoring diagram/findings for description). The left  ventricular internal cavity size was moderately dilated. Left ventricular diastolic parameters are consistent with Grade II diastolic dysfunction (pseudonormalization). Elevated left atrial pressure.  3. Right ventricular systolic function is normal. The right ventricular size is normal.  4. Left atrial size was mildly dilated.  5. The mitral valve is normal in structure. Mild mitral valve regurgitation. No evidence of mitral stenosis.  6. The aortic valve has been repaired/replaced. Aortic valve regurgitation is mild. No aortic stenosis is present. There is a St. Jude valve present in the aortic position.  7. Aortic dilatation noted. There is mild dilatation of the aortic root, measuring 39 mm.  8. The inferior vena cava is normal in size with greater than 50% respiratory variability, suggesting right atrial pressure of 3 mmHg.   EPS: 12/20/21 CONCLUSIONS: -- Typical (cavotricuspid isthmus-dependent) flutter s/p successful ablation -- Bidirectional block across CTI-line post-ablation -- Normal CRT-D function post-ablation, reprogrammed to baseline settings   Recent Labs: 01/12/2022: TSH 3.940 04/03/2022: ALT 16; BUN 21; Creatinine, Ser 1.22; Hemoglobin 15.0; Platelets 563; Potassium 4.9; Sodium 139  No results found for requested labs within last 365 days.   Estimated Creatinine  Clearance: 54.7 mL/min (by C-G formula based on SCr of 1.22 mg/dL).   Wt Readings from Last 3 Encounters:  04/14/22 160 lb 9.6 oz (72.8 kg)  04/03/22 165 lb (74.8 kg)  01/06/22 155 lb (70.3 kg)     Other studies reviewed: Additional studies/records reviewed today include: summarized above  ASSESSMENT AND PLAN:  CRT-D Intact function No programming changes made  VF arrest VT Chronic amiodarone Recurrent MMVT Increase amiodarone to '200mg'$  BID x 2 weeks then once daily, advised not to reduce any further then that  Advised no driving 6 mo  CAD No anginal symptoms Suspect his VT is scar mediate He is over due to see Dr. Haroldine Laws  ICM Chronic CHF (  systolic) No symptoms or exam findings of volume OL Foot print looks good Over due for HF team  7. PAfib (?), Aflutter CHA2DS2Vasc is 4, on warfarin He reports that in review and d/w Dr. Glennon Mac, AFlutter only Ablated July 2023 He will call [facility] office to move up his INR check given increase in amiodarone dose  8. Mechanical AVR (redo) Echo looked OK    Disposition: F/u with he is sue to see Dr. Haroldine Laws, we will see him back in 3 mo, sooner if needed.  Current medicines are reviewed at length with the patient today.  The patient did not have any concerns regarding medicines.  Venetia Night, PA-C 04/14/2022 4:51 PM     [address] [place] [address] [phone] (office)  [phone] (fax)

## 2022-04-24 ENCOUNTER — Encounter: Payer: Self-pay | Admitting: Internal Medicine

## 2022-04-26 ENCOUNTER — Ambulatory Visit: Payer: Medicare Other | Attending: Internal Medicine | Admitting: *Deleted

## 2022-04-26 DIAGNOSIS — Z5181 Encounter for therapeutic drug level monitoring: Secondary | ICD-10-CM

## 2022-04-26 DIAGNOSIS — Z952 Presence of prosthetic heart valve: Secondary | ICD-10-CM

## 2022-04-26 LAB — POCT INR: INR: 1.8 — AB (ref 2.0–3.0)

## 2022-04-26 NOTE — Patient Instructions (Signed)
Take warfarin 1 tablet tonight then resume 1 tablet daily except 1/2 tablet on Mondays, Wednesdays and Fridays Started amiodarone '200mg'$  twice daily x 2 weeks.  On Dec 1st decrease to '200mg'$  daily Recheck in 2 wks Coumadin Clinic [phone]

## 2022-05-02 NOTE — Addendum Note (Signed)
Addended by: Janan Halter F on: 05/02/2022 05:03 PM   Modules accepted: Orders

## 2022-05-04 ENCOUNTER — Ambulatory Visit (INDEPENDENT_AMBULATORY_CARE_PROVIDER_SITE_OTHER): Payer: Medicare Other

## 2022-05-04 DIAGNOSIS — I255 Ischemic cardiomyopathy: Secondary | ICD-10-CM | POA: Diagnosis not present

## 2022-05-04 LAB — CUP PACEART REMOTE DEVICE CHECK
Battery Remaining Longevity: 66 mo
Battery Remaining Percentage: 90 %
Brady Statistic RA Percent Paced: 5 %
Brady Statistic RV Percent Paced: 93 %
Date Time Interrogation Session: 20231207004100
HighPow Impedance: 68 Ohm
Implantable Lead Connection Status: 753985
Implantable Lead Connection Status: 753985
Implantable Lead Connection Status: 753985
Implantable Lead Implant Date: 20180105
Implantable Lead Implant Date: 20180105
Implantable Lead Implant Date: 20180105
Implantable Lead Location: 753858
Implantable Lead Location: 753859
Implantable Lead Location: 753860
Implantable Lead Model: 292
Implantable Lead Model: 4672
Implantable Lead Model: 7741
Implantable Lead Serial Number: 428973
Implantable Lead Serial Number: 801088
Implantable Lead Serial Number: 849519
Implantable Pulse Generator Implant Date: 20180105
Lead Channel Impedance Value: 644 Ohm
Lead Channel Impedance Value: 688 Ohm
Lead Channel Impedance Value: 728 Ohm
Lead Channel Pacing Threshold Amplitude: 0.6 V
Lead Channel Pacing Threshold Amplitude: 0.9 V
Lead Channel Pacing Threshold Amplitude: 1.4 V
Lead Channel Pacing Threshold Pulse Width: 0.4 ms
Lead Channel Pacing Threshold Pulse Width: 0.4 ms
Lead Channel Pacing Threshold Pulse Width: 1 ms
Lead Channel Setting Pacing Amplitude: 2 V
Lead Channel Setting Pacing Amplitude: 2 V
Lead Channel Setting Pacing Amplitude: 2.2 V
Lead Channel Setting Pacing Pulse Width: 0.4 ms
Lead Channel Setting Pacing Pulse Width: 1 ms
Lead Channel Setting Sensing Sensitivity: 0.6 mV
Lead Channel Setting Sensing Sensitivity: 1 mV
Pulse Gen Serial Number: 180139

## 2022-05-08 MED ORDER — AMIODARONE HCL 200 MG PO TABS: ORAL_TABLET | ORAL | 2 refills | Status: DC

## 2022-05-08 NOTE — Progress Notes (Signed)
Advanced Heart Failure Clinic Note    PCP: Michell Heinrich, DO HF Cardiologist: Dr. Haroldine Laws   HPI: Taylor [name] is a 71 y.o. male with mechnical AVR on  chronic coumadin, h/o VF arrest s/p ICD, CAD s/p CABG, chronic systolic HF (EF 34-74%), AAA and h/o essential thrombosis.    Has h/o congenital AoV abnormality with AI. In 1994 had MI and underwent PCI (unclear artery) and mechanical AVR at [facility].    Has h/o polysubstance abuse with cocaine, ETOH and tobacco abuse. No longer doing cocaine or smoking.    Did well from heart perspective until 2017, when he had VF arrest on 05/18/16. Found to have 3v CAD and MV disease and underwent CABG x 3 with re-do mechanical AVR. Post-operatively had AF (treated with amio) and CHB requiring implantation of a BosSci CRT-D device. This is followed by Dr. Caryl Comes. Coumadin Clinic at [facility] street follows INR. INR goal 2.0-3.0.    Echo on 12/18 showed LVEF of 25-30% stable AVR   Had CT on 12/18 for his back. Showed 3.7cm AAA. Had f/u US 05/2018 that showed mild increase in size of the known distal fusiform abdominal aortic aneurysm, measuring 4.2 cm in maximum diameter. He was referred to [facility] and he is now followed by Dr. Oneida Alar.     Most recent echo 02/2019 showed EF 20-25%. RV normal. A  He was admitted to [facility] on 1/21 w/ acute cholecystitis and sepsis. Hypotensive on admit w/ AKI and HF meds held. SCr bumped to 1.7 (baseline 1.1). Treated w/ IVFs and abx. Blood cultures negative. General surgery consulted and plans were for percutaneous cholecystostomy w/ drain, however HIDA scan was negative and he improved symptomatically w/ conservative therapy.   Echo 02/03/21: EF 25-30%, AVR looks good.   In AFL 2/23, EP unsuccessful with pace termination. Underwent DCCV to NSR (4/23) but had ERAF about a week later.  Echo (5/23) showed EF 25-30%, grade II DD, RV ok, s/p AVR with mild AI  S/p CTI ablation 7/23 at [place] with Dr. Glennon Mac. Patient called [facility] 8/23 with 3  ICD therapies. He notified Dr. Caryl Comes who increased amio.  Saw EP 11/23, dizzy spells improved since fixing antifreeze leak in his car. Device interrogation showed recurrent MMVT and amio increased to 200 bid x 2 weeks then 200 daily.   Today he returns for HF follow up. Overall feeling fine. He has SOB walking up an incline. No longer going to the gym due to dizziness. He has 1 episode of dizziness a day, was told these episodes likely mean he is out of rhythm. He attributes these episodes to when he tries to reduce his amiodarone dose. Denies palpitations, CP, edema, or PND/Orthopnea. Appetite ok. No fever or chills. Weight at home 160-165 pounds. Taking all medications. Has inadvertently inhaled anti-freeze fumes in his truck, interestingly his dizzy spells mostly happen in his vehicle. Drinks 2 12 oz/beers a day and 1 glass of wine. No drug/tobacco use.  Past Medical History:  Diagnosis Date   Atherosclerotic heart disease of native coronary artery without angina pectoris    Chronic anticoagulation 2/59/5638   Chronic systolic heart failure ([place]) 02/02/2017   Essential thrombocythemia ([place]) 02/15/2017   Hyperlipidemia    Hypertension    Ischemic cardiomyopathy    Leg pain, central, left 04/17/2017   Hx sciatica - pain similar; no red flag signs on hx or exam    Presence of other heart-valve replacement    Ventricular tachycardia (HCC)    Current  Outpatient Medications  Medication Sig Dispense Refill   amiodarone (PACERONE) 100 MG tablet Take 100 mg by mouth in the morning.     amiodarone (PACERONE) 200 MG tablet Take 200 mg by mouth every evening.     amoxicillin (AMOXIL) 500 MG capsule Take 4 capsules (2.'000mg'$ ) one hour before dental procedures. 4 capsule 0   Ascorbic Acid (CVS VITAMIN C PO) Take 1 tablet by mouth daily. 60 mg Vit C and 15 mg Zinc ( combo)     ASHWAGANDHA PO Take 600 mg by mouth.     aspirin 81 MG tablet Take 81 mg by mouth daily.     benzonatate (TESSALON) 100 MG  capsule Take 100 mg by mouth 3 (three) times daily as needed for cough.     carvedilol (COREG) 3.125 MG tablet TAKE 1 TABLET(3.125 MG) BY MOUTH TWICE DAILY WITH A MEAL 180 tablet 3   cholecalciferol (VITAMIN D3) 25 MCG (1000 UNIT) tablet Take 1,000 Units by mouth daily.     dapagliflozin propanediol (FARXIGA) 10 MG TABS tablet Take 1 tablet (10 mg total) by mouth daily. 90 tablet 3   hydroxyurea (HYDREA) 500 MG capsule Take 2 capsules (1,000 mg total) by mouth daily. May take with food to minimize GI side effects. 180 capsule 2   levocetirizine (XYZAL) 5 MG tablet Take 5 mg by mouth daily.     levothyroxine (SYNTHROID) 75 MCG tablet Take 75 mcg by mouth daily before breakfast.     sacubitril-valsartan (ENTRESTO) 24-26 MG Take 1 tablet by mouth daily at 6 (six) AM. 180 tablet 3   spironolactone (ALDACTONE) 25 MG tablet TAKE 1/2 TABLET(12.5 MG) BY MOUTH DAILY 45 tablet 1   traZODone (DESYREL) 50 MG tablet Take by mouth.     vitamin B-12 (CYANOCOBALAMIN) 1000 MCG tablet Take 1,000 mcg by mouth daily.     warfarin (COUMADIN) 5 MG tablet Take 0.5-1 tablets (2.5-5 mg total) by mouth See admin instructions. Take 1 tablet daily except 1/2 tablet on Mondays and Fridays 90 tablet 1   No current facility-administered medications for this encounter.   Allergies  Allergen Reactions   Erythromycin Rash   Rosuvastatin Calcium     Muscle cramps   Simvastatin     Muscle cramps   Zithromax [Azithromycin] Rash   Social History   Socioeconomic History   Marital status: Divorced    Spouse name: Not on file   Number of children: Not on file   Years of education: Not on file   Highest education level: Not on file  Occupational History   Not on file  Tobacco Use   Smoking status: Former    Packs/day: 1.00    Years: 30.00    Total pack years: 30.00    Types: Cigarettes    Start date: 05/26/1979    Quit date: 05/18/2016    Years since quitting: 5.9   Smokeless tobacco: Never  Vaping Use   Vaping  Use: Never used  Substance and Sexual Activity   Alcohol use: Yes    Alcohol/week: 10.0 standard drinks of alcohol    Types: 10 Cans of beer per week    Comment: 1 can a day..   Drug use: No   Sexual activity: Not on file  Other Topics Concern   Not on file  Social History Narrative   Not on file   Social Determinants of Health   Financial Resource Strain: Not on file  Food Insecurity: Food Insecurity Present (04/03/2022)   Hunger  Vital Sign    Worried About Charity fundraiser in the Last Year: Sometimes true    Ran Out of Food in the Last Year: Sometimes true  Transportation Needs: No Transportation Needs (04/03/2022)   PRAPARE - Hydrologist (Medical): No    Lack of Transportation (Non-Medical): No  Physical Activity: Not on file  Stress: Not on file  Social Connections: Not on file  Intimate Partner Violence: Not At Risk (04/03/2022)   Humiliation, Afraid, Rape, and Kick questionnaire    Fear of Current or Ex-Partner: No    Emotionally Abused: No    Physically Abused: No    Sexually Abused: No   Family History  Problem Relation Age of Onset   Hypertension Father    BP 118/62   Pulse (!) 59   Wt 73.5 kg (162 lb)   SpO2 96%   BMI 23.92 kg/m   Wt Readings from Last 3 Encounters:  05/12/22 73.5 kg (162 lb)  04/14/22 72.8 kg (160 lb 9.6 oz)  04/03/22 74.8 kg (165 lb)   PHYSICAL EXAM: General:  NAD. No resp difficulty, walked into clinic HEENT: Normal Neck: Supple. No JVD. Carotids 2+ bilat; no bruits. No lymphadenopathy or thryomegaly appreciated. Cor: PMI nondisplaced. Regular rate & rhythm. No rubs, gallops or murmurs. Crisp mechanical S2 Lungs: Clear Abdomen: Soft, nontender, nondistended. No hepatosplenomegaly. No bruits or masses. Good bowel sounds. Extremities: No cyanosis, clubbing, rash, edema Neuro: Alert & oriented x 3, cranial nerves grossly intact. Moves all 4 extremities w/o difficulty. Affect pleasant..  ECG (personally  reviewed): SB, BiV paced with PVCs, 54 bpm  Device interrogation (personally reviewed): no recent AF/AT, average HR 71 bpm, 1.8 hr/day, VT with ATP x 1 on 04/12/22, PMT on 04/30/22  ASSESSMENT & PLAN: 1. Chronic Systolic Heart Failure - Echo (05/2016 @ [facility]): EF 40% - Echo (12/18) EF 25-30% - Echo (12/20): EF 20-25%. Diffuse hypokinesis, No LVH. RV normal. Mild AI, trace MR - s/p CRT-D.  - Echo 5/23 EF 25-30% - NYHA II, limited by dizziness. He is not volume overloaded on exam. - With dizziness, stop Entresto and start losartan 12.5 mg qhs. - Continue Farxiga 10 mg daily. - Continue Coreg 3.125 mg bid.  - Continue spiro 12.5 mg daily.  - Labs today.  2. CAD - h/o remote PCI in 1994 at [place] - s/p LHC in 2017 in the setting of Vfib Arrest w/ 3V CAD treated w/ CABG x 3 - No chest pain. - Continue ASA, statin and ? blocker. - Goal LDL < 70   4. Mechanical Aortic Valve - Stable on recent echo. - On coumadin. INRs [facility] by [facility] - No bleeding. - Reminded of SBE prophylaxis (has dentures)  5. H/o VF Arrest and H/o CHB - s/p BosSci CRT-D device, followed by EP (Dr. Caryl Comes) - Continue amiodarone 100 qam/200qpm, per EP. I don't think his dizziness is related to his amiodarone as he just self-adjusted and dizziness remains. - VT on 04/12/22 with ATP x 1, by device interrogation.  6. AAA  - CT scan 1/21, now measuring at 4.6 x 4.5 cm (nonruptured) - Followed by [facility], AAA increased from 4.6 cm --> 4.9 - Vascular planning repeat duplex in 6 months. - BP and HR controlled w/ ? blocke.r - Continue statin + ASA  7. Cholelithiasis  - Previous admission for acute cholecystitis/ sepsis. HIDA scan negative. Improved w/ conservative therapy w/ abx and bowel rest.  - Has  seen [facility]. Conservative therapy for now - No change for now  8. H/o substance abuse - Advised he cut back on ETOH  Follow up in 4 months with Dr. Haroldine Laws.  [place], FNP 05/12/22

## 2022-05-09 ENCOUNTER — Other Ambulatory Visit: Payer: Self-pay | Admitting: *Deleted

## 2022-05-09 MED ORDER — AMIODARONE HCL 100 MG PO TABS: 300.0000 mg | ORAL_TABLET | Freq: Every day | ORAL | 1 refills | Status: DC

## 2022-05-10 ENCOUNTER — Ambulatory Visit: Payer: Medicare Other | Attending: Internal Medicine | Admitting: *Deleted

## 2022-05-10 DIAGNOSIS — Z952 Presence of prosthetic heart valve: Secondary | ICD-10-CM

## 2022-05-10 DIAGNOSIS — Z5181 Encounter for therapeutic drug level monitoring: Secondary | ICD-10-CM | POA: Diagnosis not present

## 2022-05-10 LAB — POCT INR: INR: 3.4 — AB (ref 2.0–3.0)

## 2022-05-10 NOTE — Patient Instructions (Signed)
Hold warfarin tonight then resume 1 tablet daily except 1/2 tablet on Mondays, Wednesdays and Fridays Back up to Amiodarone '300mg'$  daily Increase Kale Recheck in 2 wks Coumadin Clinic [phone]

## 2022-05-11 ENCOUNTER — Inpatient Hospital Stay: Payer: Medicare Other | Admitting: Physician Assistant

## 2022-05-11 ENCOUNTER — Inpatient Hospital Stay: Payer: Medicare Other

## 2022-05-12 ENCOUNTER — Ambulatory Visit (HOSPITAL_COMMUNITY)
Admission: RE | Admit: 2022-05-12 | Discharge: 2022-05-12 | Disposition: A | Discharge status: Home / Self Care | Payer: Medicare Other | Source: Ambulatory Visit | Attending: Family Medicine | Admitting: Family Medicine

## 2022-05-12 ENCOUNTER — Encounter (HOSPITAL_COMMUNITY): Payer: Self-pay

## 2022-05-12 VITALS — BP 118/62 | HR 59 | Wt 162.0 lb

## 2022-05-12 DIAGNOSIS — I252 Old myocardial infarction: Secondary | ICD-10-CM | POA: Diagnosis not present

## 2022-05-12 DIAGNOSIS — Z87891 Personal history of nicotine dependence: Secondary | ICD-10-CM | POA: Diagnosis not present

## 2022-05-12 DIAGNOSIS — Z952 Presence of prosthetic heart valve: Secondary | ICD-10-CM | POA: Diagnosis not present

## 2022-05-12 DIAGNOSIS — I5022 Chronic systolic (congestive) heart failure: Secondary | ICD-10-CM | POA: Insufficient documentation

## 2022-05-12 DIAGNOSIS — I472 Ventricular tachycardia, unspecified: Secondary | ICD-10-CM | POA: Diagnosis not present

## 2022-05-12 DIAGNOSIS — Z8674 Personal history of sudden cardiac arrest: Secondary | ICD-10-CM | POA: Diagnosis not present

## 2022-05-12 DIAGNOSIS — I4901 Ventricular fibrillation: Secondary | ICD-10-CM

## 2022-05-12 DIAGNOSIS — Z7982 Long term (current) use of aspirin: Secondary | ICD-10-CM | POA: Diagnosis not present

## 2022-05-12 DIAGNOSIS — I110 Hypertensive heart disease with heart failure: Secondary | ICD-10-CM | POA: Insufficient documentation

## 2022-05-12 DIAGNOSIS — Z7901 Long term (current) use of anticoagulants: Secondary | ICD-10-CM | POA: Diagnosis not present

## 2022-05-12 DIAGNOSIS — Z951 Presence of aortocoronary bypass graft: Secondary | ICD-10-CM | POA: Diagnosis not present

## 2022-05-12 DIAGNOSIS — I2510 Atherosclerotic heart disease of native coronary artery without angina pectoris: Secondary | ICD-10-CM | POA: Insufficient documentation

## 2022-05-12 DIAGNOSIS — Z95810 Presence of automatic (implantable) cardiac defibrillator: Secondary | ICD-10-CM | POA: Insufficient documentation

## 2022-05-12 DIAGNOSIS — I714 Abdominal aortic aneurysm, without rupture, unspecified: Secondary | ICD-10-CM | POA: Diagnosis not present

## 2022-05-12 DIAGNOSIS — K8020 Calculus of gallbladder without cholecystitis without obstruction: Secondary | ICD-10-CM | POA: Insufficient documentation

## 2022-05-12 DIAGNOSIS — R9431 Abnormal electrocardiogram [ECG] [EKG]: Secondary | ICD-10-CM | POA: Diagnosis not present

## 2022-05-12 DIAGNOSIS — Z79899 Other long term (current) drug therapy: Secondary | ICD-10-CM | POA: Insufficient documentation

## 2022-05-12 DIAGNOSIS — F1911 Other psychoactive substance abuse, in remission: Secondary | ICD-10-CM

## 2022-05-12 DIAGNOSIS — Z8719 Personal history of other diseases of the digestive system: Secondary | ICD-10-CM

## 2022-05-12 LAB — BRAIN NATRIURETIC PEPTIDE: B Natriuretic Peptide: 563.9 pg/mL — ABNORMAL HIGH (ref 0.0–100.0)

## 2022-05-12 LAB — BASIC METABOLIC PANEL
Anion gap: 9 (ref 5–15)
BUN: 21 mg/dL (ref 8–23)
CO2: 26 mmol/L (ref 22–32)
Calcium: 9.1 mg/dL (ref 8.9–10.3)
Chloride: 100 mmol/L (ref 98–111)
Creatinine, Ser: 1.16 mg/dL (ref 0.61–1.24)
GFR, Estimated: >60 mL/min (ref >60)
Glucose, Bld: 85 mg/dL (ref 70–99)
Potassium: 4.8 mmol/L (ref 3.5–5.1)
Sodium: 135 mmol/L (ref 135–145)

## 2022-05-12 LAB — MAGNESIUM: Magnesium: 2.2 mg/dL (ref 1.7–2.4)

## 2022-05-12 MED ORDER — LOSARTAN POTASSIUM 25 MG PO TABS: 12.5000 mg | ORAL_TABLET | Freq: Every day | ORAL | 8 refills | Status: DC

## 2022-05-12 NOTE — Patient Instructions (Addendum)
Thank you for coming in today  Labs were done today, if any labs are abnormal the clinic will call you No news is good news  STOP Entresto START Losartan 12.5 mg 1/2 tablet daily   Your physician recommends that you schedule a follow-up appointment in: 4 months with Dr. Haroldine Laws   Do the following things EVERYDAY: Weigh yourself in the morning before breakfast. Write it down and keep it in a log. Take your medicines as prescribed Eat low salt foods--Limit salt (sodium) to 2000 mg per day.  Stay as active as you can everyday Limit all fluids for the day to less than 2 liters  At the [facility], you and your health needs are our priority. As part of our continuing mission to provide you with exceptional heart care, we have created designated Provider Care Teams. These Care Teams include your primary Cardiologist (physician) and Advanced Practice Providers (APPs- Physician Assistants and Nurse Practitioners) who all work together to provide you with the care you need, when you need it.   You may see any of the following providers on your designated Care Team at your next follow up: Dr Glori Bickers Dr Loralie Champagne Dr. Roxana Hires, NP Lyda Jester, [place] [facility] [place], [place] Forestine Na, NP Audry Riles, PharmD   Please be sure to bring in all your medications bottles to every appointment.   If you have any questions or concerns before your next appointment please send [place] a message through [place] or call our office at [phone].    TO LEAVE A MESSAGE FOR THE NURSE SELECT OPTION 2, PLEASE LEAVE A MESSAGE INCLUDING: YOUR NAME DATE OF BIRTH CALL BACK NUMBER REASON FOR CALL**this is important as we prioritize the call backs  YOU WILL RECEIVE A CALL BACK THE SAME DAY AS LONG AS YOU CALL BEFORE 4:00 PM

## 2022-05-15 ENCOUNTER — Telehealth (HOSPITAL_COMMUNITY): Payer: Self-pay | Admitting: Cardiology

## 2022-05-15 DIAGNOSIS — I5022 Chronic systolic (congestive) heart failure: Secondary | ICD-10-CM

## 2022-05-15 MED ORDER — POTASSIUM CHLORIDE CRYS ER 10 MEQ PO TBCR: 10.0000 meq | EXTENDED_RELEASE_TABLET | Freq: Every day | ORAL | 6 refills | Status: DC

## 2022-05-15 MED ORDER — FUROSEMIDE 20 MG PO TABS: 20.0000 mg | ORAL_TABLET | Freq: Every day | ORAL | 6 refills | Status: DC

## 2022-05-15 NOTE — Telephone Encounter (Signed)
-----   Message from Rafael Bihari, [place] sent at 05/12/2022  4:44 PM EST ----- BNP elevated.  Start Lasix 20 mg daily with 10 KCL daily.  Repeat BMET and BNP in 10-14 days

## 2022-05-15 NOTE — Telephone Encounter (Signed)
Patient called.  Patient aware.  

## 2022-05-17 NOTE — Telephone Encounter (Signed)
Patient has no episodes since he seen HF clinic on 04/12/22, patient has appointment with Dr. Caryl Comes on 05/26/22.

## 2022-05-18 ENCOUNTER — Telehealth (HOSPITAL_COMMUNITY): Payer: Self-pay

## 2022-05-18 NOTE — Telephone Encounter (Signed)
Patient returned call to triage with concerns of medication interaction that was reported by pharmacy Arlyce Harman + lasix. Pt also concerns with starting Losartan if entresto caused so much dizziness  Advised spiro and lasix are common hf medication combinations, reviewed the need for lasix given recent elevated BNP. Ironed out why potassium supplementation was needed with the start of lasix.  Advised why losartan was replaced for entresto, advised to make sure to take at bedtime, notify office if dizziness returns. Be sure to monitor weight and b/p.  Offered HF pharmacy appt to recheck b/p, and medication education for CHF GDMT, pt appreciative of call and above advise.

## 2022-05-18 NOTE — Telephone Encounter (Signed)
Patient called concerned about his medications. He does not know why he is on Losartan when he does not have high BP. He is also concerned about the interaction of that with his Lasix. I tried to call him back to get more information, but no answer. Can you take a look?

## 2022-05-24 ENCOUNTER — Ambulatory Visit: Payer: Medicare Other | Attending: Internal Medicine | Admitting: *Deleted

## 2022-05-24 DIAGNOSIS — Z5181 Encounter for therapeutic drug level monitoring: Secondary | ICD-10-CM

## 2022-05-24 DIAGNOSIS — Z952 Presence of prosthetic heart valve: Secondary | ICD-10-CM

## 2022-05-24 LAB — POCT INR: INR: 4.3 — AB (ref 2.0–3.0)

## 2022-05-24 NOTE — Patient Instructions (Signed)
Hold warfarin tonight then decrease dose to 1/2 tablet daily except 1 tablets on Sundays, Tuesdays and Thursdays Back up to Amiodarone '300mg'$  daily Continue Kale Recheck in 2 wks Coumadin Clinic [phone]

## 2022-05-26 ENCOUNTER — Encounter: Payer: Self-pay | Admitting: Internal Medicine

## 2022-05-26 ENCOUNTER — Ambulatory Visit: Payer: Medicare Other | Attending: Internal Medicine | Admitting: Internal Medicine

## 2022-05-26 VITALS — BP 118/68 | HR 57 | Ht 69.0 in | Wt 160.0 lb

## 2022-05-26 DIAGNOSIS — I4901 Ventricular fibrillation: Secondary | ICD-10-CM | POA: Diagnosis not present

## 2022-05-26 DIAGNOSIS — Z95810 Presence of automatic (implantable) cardiac defibrillator: Secondary | ICD-10-CM

## 2022-05-26 DIAGNOSIS — I472 Ventricular tachycardia, unspecified: Secondary | ICD-10-CM

## 2022-05-26 DIAGNOSIS — I255 Ischemic cardiomyopathy: Secondary | ICD-10-CM | POA: Diagnosis not present

## 2022-05-26 DIAGNOSIS — I4892 Unspecified atrial flutter: Secondary | ICD-10-CM

## 2022-05-26 MED ORDER — RANOLAZINE ER 500 MG PO TB12: 500.0000 mg | ORAL_TABLET | Freq: Two times a day (BID) | ORAL | 3 refills | Status: DC

## 2022-05-26 NOTE — Patient Instructions (Addendum)
Medication Instructions:  Your physician has recommended you make the following change in your medication:  1) START taking ranolazine 500 BID  *If you need a refill on your cardiac medications before your next appointment, please call your pharmacy*  Lab Work: TODAY: TSH If you have labs (blood work) drawn today and your tests are completely normal, you will receive your results only by: [place] (if you have MyChart) OR A paper copy in the mail If you have any lab test that is abnormal or we need to change your treatment, we will call you to review the results.  Follow-Up: At [facility], you and your health needs are our priority.  As part of our continuing mission to provide you with exceptional heart care, we have created designated Provider Care Teams.  These Care Teams include your primary Cardiologist (physician) and Advanced Practice Providers (APPs -  Physician Assistants and Nurse Practitioners) who all work together to provide you with the care you need, when you need it.   Your next appointment:   6 months  The format for your next appointment:   In Person  Provider:   You may see Dr. Virl Axe or one of the following Advanced Practice Providers on your designated Care Team:   Tommye Standard, [place] Legrand Como "Jonni Sanger" Chalmers Cater, [place]    Important Information About Sugar

## 2022-05-26 NOTE — Progress Notes (Signed)
Patient Care Team: Michell Heinrich, DO as PCP - General (Family Medicine) Annia Belt, MD as Consulting Physician (Oncology) Bensimhon, Shaune Pascal, MD as Consulting Physician (Cardiology)   HPI  Taylor [name] is a 71 y.o. male seen in follow-up for VF arrest 12/17.  He has a history of a prior mechanical aortic valve replacement.  Following his arrest he underwent CABG and mechanical aortic valve replacement. Not unsurprisingly he ended up with postoperative CHB and underwent CRT-D implantation.   He has a history of congenital aortic malformation with aortic insufficiency.  History of ischemic cardiomyopathy with prior MI and prior PCI.  Atrial fibrillation identified the onset of mid February 2023  Prior history of polysubstance abuse.     On amiodarone for freq PVCs started at [facility].  Underwent catheter ablation at [facility] 7/23 for atrial flutter.  Intercurrent ICD therapies, VT treated with ATP.  Amiodarone was increased but then decreased again because of dizziness thought perhaps neurotoxicity seen by [facility] 8/13 with interval ATP again 8/12 and 8/14.  Recurrent VT 11/23 with ATP   Patient denies symptoms of respiratory, GI intolerance, sun sensitivity, neurological symptoms attributable to amiodarone.       No chest pain.  Chronic stable shortness of breath.  No edema.  No nocturnal dyspnea orthopnea.  Does have orthostatic lightheadedness   DATE TEST  EF    1/18 Echo  40 %     12/18 Echo  25-30%      9/22 Echo  20-25%     5/23 Echo  25-30%       Date Cr K Hgb TSH LFTs  1/20 1.19 3.8 13.1 21.5 23  2/21  1.09 3.7   110(1/21)  1/22    5.002   9/22 0.99   15.6 (MCV115)      7/23 1.0 4.2 15.2 5.002 27      Records and Results Reviewed   Past Medical History:  Diagnosis Date   Atherosclerotic heart disease of native coronary artery without angina pectoris    Chronic anticoagulation 9/79/8921   Chronic systolic heart failure ([place]) 02/02/2017   Essential  thrombocythemia ([place]) 02/15/2017   Hyperlipidemia    Hypertension    Ischemic cardiomyopathy    Leg pain, central, left 04/17/2017   Hx sciatica - pain similar; no red flag signs on hx or exam    Presence of other heart-valve replacement    Ventricular tachycardia ([place])     Past Surgical History:  Procedure Laterality Date   BIV ICD INSERTION CRT-D     CARDIOVERSION N/A 10/21/2020   Procedure: CARDIOVERSION;  Surgeon: Lelon Perla, MD;  Location: [place];  Service: Cardiovascular;  Laterality: N/A;   CARDIOVERSION N/A 09/22/2021   Procedure: CARDIOVERSION;  Surgeon: Sueanne Margarita, MD;  Location: [place];  Service: Cardiovascular;  Laterality: N/A;   CORONARY ARTERY BYPASS GRAFT  2017   MECHANICAL AORTIC VALVE REPLACEMENT     TEE WITHOUT CARDIOVERSION N/A 10/21/2020   Procedure: TRANSESOPHAGEAL ECHOCARDIOGRAM (TEE);  Surgeon: Lelon Perla, MD;  Location[facility] ENDOSCOPY;  Service: Cardiovascular;  Laterality: N/A;    Current Meds  Medication Sig   amiodarone (PACERONE) 100 MG tablet Take 100 mg by mouth in the morning.   amiodarone (PACERONE) 200 MG tablet Take 200 mg by mouth every evening.   amoxicillin (AMOXIL) 500 MG capsule Take 4 capsules (2.'000mg'$ ) one hour before dental procedures.   Ascorbic Acid (CVS VITAMIN C PO) Take 1 tablet by  mouth daily. 60 mg Vit C and 15 mg Zinc ( combo)   aspirin 81 MG tablet Take 81 mg by mouth daily.   benzonatate (TESSALON) 100 MG capsule Take 100 mg by mouth 3 (three) times daily as needed for cough.   carvedilol (COREG) 3.125 MG tablet TAKE 1 TABLET(3.125 MG) BY MOUTH TWICE DAILY WITH A MEAL   dapagliflozin propanediol (FARXIGA) 10 MG TABS tablet Take 1 tablet (10 mg total) by mouth daily.   furosemide (LASIX) 20 MG tablet Take 1 tablet (20 mg total) by mouth daily.   hydroxyurea (HYDREA) 500 MG capsule Take 2 capsules (1,000 mg total) by mouth daily. May take with food to minimize GI side effects.   levothyroxine (SYNTHROID) 75  MCG tablet Take 75 mcg by mouth daily before breakfast.   losartan (COZAAR) 25 MG tablet Take 0.5 tablets (12.5 mg total) by mouth daily.   potassium chloride SA (KLOR-CON M) 10 MEQ tablet Take 1 tablet (10 mEq total) by mouth daily.   spironolactone (ALDACTONE) 25 MG tablet TAKE 1/2 TABLET(12.5 MG) BY MOUTH DAILY   traZODone (DESYREL) 50 MG tablet Take by mouth.   vitamin B-12 (CYANOCOBALAMIN) 1000 MCG tablet Take 1,000 mcg by mouth daily.   Vitamin D, Ergocalciferol, (DRISDOL) 1.25 MG (50000 UNIT) CAPS capsule Take 50,000 Units by mouth every 7 (seven) days.   warfarin (COUMADIN) 5 MG tablet Take 0.5-1 tablets (2.5-5 mg total) by mouth See admin instructions. Take 1 tablet daily except 1/2 tablet on Mondays and Fridays   [DISCONTINUED] cholecalciferol (VITAMIN D3) 25 MCG (1000 UNIT) tablet Take 1,000 Units by mouth daily.   [DISCONTINUED] levocetirizine (XYZAL) 5 MG tablet Take 5 mg by mouth daily.   [DISCONTINUED] sacubitril-valsartan (ENTRESTO) 24-26 MG Take 1 tablet by mouth daily at 6 (six) AM.    Allergies  Allergen Reactions   Erythromycin Rash   Rosuvastatin Calcium     Muscle cramps   Simvastatin     Muscle cramps   Zithromax [Azithromycin] Rash      Review of Systems negative except from HPI and PMH BP 118/68   Pulse (!) 57   Ht '5\' 9"'$  (1.753 m)   Wt 160 lb (72.6 kg)   SpO2 96%   BMI 23.63 kg/m  Well developed and well nourished in no acute distress HENT normal Neck supple with JVP-flat Clear Device pocket well healed; without hematoma or erythema.  There is no tethering  Regular rate and rhythm, no  gallop No  murmur Abd-soft with active BS No Clubbing cyanosis  edema Skin-warm and dry A & Oriented  Grossly normal sensory and motor function  ECG sinus with P synchronous pacing with an upright QRS lead V1 and an RSR prime in lead I  Device function is  nomal. Programming changes monitor zone activated at 140 BPM See Paceart for details    Assessment and   Plan Ischemic cardiomyopathy status post CABG  Aborted cardiac arrest  Valvular heart disease status post mechanical aortic/mitral valve replacement  ICD-[employer]-CRT-D    PVCs  Complete Heart Block  Amiodarone therapy  Hypothyroidism  Hypertransaminasemia   Macrocytosis  Alcohol abuse   Underwent atrial flutter ablation at [facility].  Needs to maintain anticoagulation because of his valve.  Recurrent paced terminated ventricular tachycardia, in the setting of ischemic heart disease have suggested we try ranolazine instead of mexiletine initially, we will begin 500 mg twice daily.  Recurrent issues with lightheadedness.  Orthostatic vital signs  are unrevealing  Current medicines are reviewed at length with the patient today .  The patient does not  have concerns regarding medicines.

## 2022-05-26 NOTE — Progress Notes (Signed)
Remote ICD transmission.   

## 2022-05-27 LAB — TSH: TSH: 3.06 u[IU]/mL (ref 0.450–4.500)

## 2022-05-30 ENCOUNTER — Ambulatory Visit (HOSPITAL_COMMUNITY)
Admission: RE | Admit: 2022-05-30 | Discharge: 2022-05-30 | Disposition: A | Discharge status: Home / Self Care | Payer: Medicare Other | Source: Ambulatory Visit | Attending: Cardiology | Admitting: Cardiology

## 2022-05-30 DIAGNOSIS — I5022 Chronic systolic (congestive) heart failure: Secondary | ICD-10-CM | POA: Insufficient documentation

## 2022-05-30 LAB — BASIC METABOLIC PANEL
Anion gap: 8 (ref 5–15)
BUN: 26 mg/dL — ABNORMAL HIGH (ref 8–23)
CO2: 28 mmol/L (ref 22–32)
Calcium: 9.2 mg/dL (ref 8.9–10.3)
Chloride: 101 mmol/L (ref 98–111)
Creatinine, Ser: 1.46 mg/dL — ABNORMAL HIGH (ref 0.61–1.24)
GFR, Estimated: 51 mL/min — ABNORMAL LOW (ref >60)
Glucose, Bld: 81 mg/dL (ref 70–99)
Potassium: 5 mmol/L (ref 3.5–5.1)
Sodium: 137 mmol/L (ref 135–145)

## 2022-05-31 ENCOUNTER — Telehealth (HOSPITAL_COMMUNITY): Payer: Self-pay

## 2022-05-31 ENCOUNTER — Other Ambulatory Visit (HOSPITAL_COMMUNITY): Payer: Self-pay

## 2022-05-31 DIAGNOSIS — I5022 Chronic systolic (congestive) heart failure: Secondary | ICD-10-CM

## 2022-05-31 NOTE — Telephone Encounter (Signed)
Patient aware and agreeable. Scheduled to have labs done at [facility], added BMET.

## 2022-06-05 ENCOUNTER — Inpatient Hospital Stay: Payer: Medicare Other | Attending: Hematology

## 2022-06-05 ENCOUNTER — Encounter: Payer: Self-pay | Admitting: Internal Medicine

## 2022-06-05 ENCOUNTER — Inpatient Hospital Stay (HOSPITAL_BASED_OUTPATIENT_CLINIC_OR_DEPARTMENT_OTHER): Payer: Medicare Other | Admitting: Physician Assistant

## 2022-06-05 VITALS — BP 130/71 | HR 60 | Temp 97.2°F | Resp 18 | Ht 69.0 in | Wt 159.7 lb

## 2022-06-05 DIAGNOSIS — D473 Essential (hemorrhagic) thrombocythemia: Secondary | ICD-10-CM

## 2022-06-05 DIAGNOSIS — Z1589 Genetic susceptibility to other disease: Secondary | ICD-10-CM

## 2022-06-05 DIAGNOSIS — Z87891 Personal history of nicotine dependence: Secondary | ICD-10-CM | POA: Diagnosis not present

## 2022-06-05 DIAGNOSIS — D75839 Thrombocytosis, unspecified: Secondary | ICD-10-CM | POA: Insufficient documentation

## 2022-06-05 LAB — CBC WITH DIFFERENTIAL/PLATELET
Abs Immature Granulocytes: 0.03 10*3/uL (ref 0.00–0.07)
Basophils Absolute: 0 10*3/uL (ref 0.0–0.1)
Basophils Relative: 1 %
Eosinophils Absolute: 0.1 10*3/uL (ref 0.0–0.5)
Eosinophils Relative: 1 %
HCT: 41.9 % (ref 39.0–52.0)
Hemoglobin: 14.5 g/dL (ref 13.0–17.0)
Immature Granulocytes: 1 %
Lymphocytes Relative: 8 %
Lymphs Abs: 0.3 10*3/uL — ABNORMAL LOW (ref 0.7–4.0)
MCH: 40.1 pg — ABNORMAL HIGH (ref 26.0–34.0)
MCHC: 34.6 g/dL (ref 30.0–36.0)
MCV: 115.7 fL — ABNORMAL HIGH (ref 80.0–100.0)
Monocytes Absolute: 0.3 10*3/uL (ref 0.1–1.0)
Monocytes Relative: 7 %
Neutro Abs: 3.1 10*3/uL (ref 1.7–7.7)
Neutrophils Relative %: 82 %
Platelets: 439 10*3/uL — ABNORMAL HIGH (ref 150–400)
RBC: 3.62 MIL/uL — ABNORMAL LOW (ref 4.22–5.81)
RDW: 16 % — ABNORMAL HIGH (ref 11.5–15.5)
WBC: 3.8 10*3/uL — ABNORMAL LOW (ref 4.0–10.5)
nRBC: 0 % (ref 0.0–0.2)

## 2022-06-05 LAB — COMPREHENSIVE METABOLIC PANEL
ALT: 21 U/L (ref 0–44)
AST: 26 U/L (ref 15–41)
Albumin: 4.1 g/dL (ref 3.5–5.0)
Alkaline Phosphatase: 61 U/L (ref 38–126)
Anion gap: 9 (ref 5–15)
BUN: 25 mg/dL — ABNORMAL HIGH (ref 8–23)
CO2: 25 mmol/L (ref 22–32)
Calcium: 9 mg/dL (ref 8.9–10.3)
Chloride: 98 mmol/L (ref 98–111)
Creatinine, Ser: 1.23 mg/dL (ref 0.61–1.24)
GFR, Estimated: >60 mL/min (ref >60)
Glucose, Bld: 94 mg/dL (ref 70–99)
Potassium: 4.3 mmol/L (ref 3.5–5.1)
Sodium: 132 mmol/L — ABNORMAL LOW (ref 135–145)
Total Bilirubin: 1.1 mg/dL (ref 0.3–1.2)
Total Protein: 7.3 g/dL (ref 6.5–8.1)

## 2022-06-05 LAB — LACTATE DEHYDROGENASE: LDH: 279 U/L — ABNORMAL HIGH (ref 98–192)

## 2022-06-05 NOTE — Progress Notes (Signed)
VIRTUAL VISIT via [place] at [place] connected with [facility]  on 06/05/22 at  2:00 PM by video and verified that I am speaking with the correct person using two identifiers.  Location: Patient: Taylor [name] Provider: Home Office   I discussed the limitations, risks, security and privacy concerns of performing an evaluation and management service by virtual/video visit and the availability of in person appointments. I also discussed with the patient that there may be a patient responsible charge related to this service. The patient expressed understanding and agreed to proceed.  REASON FOR VISIT:  Follow-up for essential thrombocytosis  CURRENT THERAPY: Hydrea 1000 mg daily  INTERVAL HISTORY:   Taylor [name] 72 y.o. male returns for routine follow-up of his essential thrombocytosis.  He was last seen by Dr. Delton Coombes and Tarri Abernethy, PA-C on 04/03/2022.  At today's visit, he reports feeling fairly well.  No recent hospitalizations, surgeries, or changes in baseline health status.  He reports some increased lightheadedness which he attributes to medication changes made by his cardiologist, also notes that he has chronically low blood pressure.  At his visit in November 2023, Hydrea was increased to 1000 mg daily.  Tolerating Hydrea well.   He denies cutaneous ulcers, mouth sores, or GI side effects.  He reports slightly decreased energy on increased dose of Hydrea, but this is tolerable.  He denies any recent tinnitus, blurry vision, strokelike symptoms.  He denies any peripheral neuropathy, but does sometimes notice numbness in unilateral arm while sleeping. No fever, chills, night sweats, or unintentional weight loss. Denies any abdominal pain, nausea, early satiety.  He has 70% energy and 100% appetite. He endorses that he is maintaining a stable weight.  ASSESSMENT & PLAN:  1.  Essential thrombocytosis - Patient referred by PCP (Dr.  Marveen Reeks) to reestablish care for treatment of essential thrombocytosis - Patient was previously seen at the [facility] by Dr. Beryle Beams, was lost to follow-up after September 2019. - Diagnosed with essential thrombocytosis in January 2009.  Presented with neurologic symptoms and MRI showed probable lacunar infarcts.  Elevated platelets were noted at that time, patient was referred to hematologist who performed bone marrow biopsy and started patient on hydroxyurea.   - JAK2 V617F POSITIVE (04/03/2022) - Currently taking hydroxyurea 1000 mg daily.  Tolerating it well.   - Patient takes Coumadin (mechanical aortic valve) and aspirin 81 mg daily - He has history of heart attack x2.  Probable lacunar infarcts on MRI in 2009, as above.  No history of DVT, PE - No aquagenic pruritus, erythromelalgia, or vasomotor symptoms.  No B symptoms. - Labs today (06/05/2022): Platelets 439, WBC 3.8, Hgb 14.5.  Lymphopenia noted with absolute lymphocyte 0.3.  CMP at baseline.  LDH 279. - PLAN: Platelets improved, but not quite at goal (<400).  Will continue Hydrea 1000 mg daily, but consider additional changes at next visit if needed. - Mild lymphopenia noted.  Will continue to follow this at next visit. - Labs (CBC/D, LDH, CMP) with RTC in 2 months  2.  OTHER HISTORY - PMH: Atrial fibrillation/flutter (chronic anticoagulation with warfarin), mechanical aortic valve (Coumadin), hyperlipidemia, systolic CHF, hypothyroidism, coronary artery disease with heart attacks in 1994 in 2017.  Most recent colonoscopy in 2019.  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  He is a former smoker, smoked 1 PPD x33 years, quit smoking in 2017.  He reports daily alcohol consumption (2  beers +1 glass of wine).  He has prior history of substance abuse with cocaine and benzodiazepine, which led to nonischemic cardiomyopathy and aortic valve failure, therefore quit in 1994.  - FAMILY: Maternal uncle with colon cancer.   Paternal aunt with colon cancer.   PLAN SUMMARY:  >> Same-day labs (CBC/D, LDH, CMP) + OFFICE visit in 2 months    REVIEW OF SYSTEMS:   Review of Systems  Constitutional:  Positive for fatigue (mild). Negative for appetite change, chills, diaphoresis, fever and unexpected weight change.  HENT:   Negative for lump/mass and nosebleeds.   Eyes:  Negative for eye problems.  Respiratory:  Negative for cough, hemoptysis and shortness of breath.   Cardiovascular:  Negative for chest pain, leg swelling and palpitations.  Gastrointestinal:  Negative for abdominal pain, blood in stool, constipation, diarrhea, nausea and vomiting.  Genitourinary:  Negative for hematuria.   Skin: Negative.   Neurological:  Positive for light-headedness and numbness (positional). Negative for dizziness and headaches.  Hematological:  Does not bruise/bleed easily.  Psychiatric/Behavioral:  Positive for sleep disturbance.      PHYSICAL EXAM (per limitations of video visit)  The patient is alert and oriented x 3, exhibiting adequate mentation, good mood, and ability to speak in full sentences and execute sound judgement.  NOTE: Since patient was present in clinic (provider off-site), patient's vital signs and weight were obtained by nursing staff at the time of this visit.    06/05/2022    1:32 PM 05/26/2022   11:28 AM 05/12/2022    1:23 PM  Vitals with BMI  Height '5\' 9"'$  '5\' 9"'$    Weight 159 lbs 11 oz 160 lbs 162 lbs  BMI 71.24 58.09   Systolic 983 382 505  Diastolic 71 68 62  Pulse 60 57 59      PAST MEDICAL/SURGICAL HISTORY:  Past Medical History:  Diagnosis Date   Atherosclerotic heart disease of native coronary artery without angina pectoris    Chronic anticoagulation 3/97/6734   Chronic systolic heart failure ([place]) 02/02/2017   Essential thrombocythemia ([place]) 02/15/2017   Hyperlipidemia    Hypertension    Ischemic cardiomyopathy    Leg pain, central, left 04/17/2017   Hx sciatica - pain similar; no  red flag signs on hx or exam    Presence of other heart-valve replacement    Ventricular tachycardia ([place])    Past Surgical History:  Procedure Laterality Date   BIV ICD INSERTION CRT-D     CARDIOVERSION N/A 10/21/2020   Procedure: CARDIOVERSION;  Surgeon: Lelon Perla, MD;  Location: [place];  Service: Cardiovascular;  Laterality: N/A;   CARDIOVERSION N/A 09/22/2021   Procedure: CARDIOVERSION;  Surgeon: Sueanne Margarita, MD;  Location: [place];  Service: Cardiovascular;  Laterality: N/A;   CORONARY ARTERY BYPASS GRAFT  2017   MECHANICAL AORTIC VALVE REPLACEMENT     TEE WITHOUT CARDIOVERSION N/A 10/21/2020   Procedure: TRANSESOPHAGEAL ECHOCARDIOGRAM (TEE);  Surgeon: Lelon Perla, MD;  Location: [facility] ENDOSCOPY;  Service: Cardiovascular;  Laterality: N/A;    SOCIAL HISTORY:  Social History   Socioeconomic History   Marital status: Divorced    Spouse name: Not on file   Number of children: Not on file   Years of education: Not on file   Highest education level: Not on file  Occupational History   Not on file  Tobacco Use   Smoking status: Former    Packs/day: 1.00    Years: 30.00    Total pack years:  30.00    Types: Cigarettes    Start date: 05/26/1979    Quit date: 05/18/2016    Years since quitting: 6.0   Smokeless tobacco: Never  Vaping Use   Vaping Use: Never used  Substance and Sexual Activity   Alcohol use: Yes    Alcohol/week: 10.0 standard drinks of alcohol    Types: 10 Cans of beer per week    Comment: 1 can a day..   Drug use: No   Sexual activity: Not on file  Other Topics Concern   Not on file  Social History Narrative   Not on file   Social Determinants of Health   Financial Resource Strain: Not on file  Food Insecurity: Food Insecurity Present (04/03/2022)   Hunger Vital Sign    Worried About Running Out of Food in the Last Year: Sometimes true    Ran Out of Food in the Last Year: Sometimes true  Transportation Needs: No  Transportation Needs (04/03/2022)   PRAPARE - Hydrologist (Medical): No    Lack of Transportation (Non-Medical): No  Physical Activity: Not on file  Stress: Not on file  Social Connections: Not on file  Intimate Partner Violence: Not At Risk (04/03/2022)   Humiliation, Afraid, Rape, and Kick questionnaire    Fear of Current or Ex-Partner: No    Emotionally Abused: No    Physically Abused: No    Sexually Abused: No    FAMILY HISTORY:  Family History  Problem Relation Age of Onset   Hypertension Father     CURRENT MEDICATIONS:  Outpatient Encounter Medications as of 06/05/2022  Medication Sig   amiodarone (PACERONE) 100 MG tablet Take 100 mg by mouth in the morning.   amiodarone (PACERONE) 200 MG tablet Take 200 mg by mouth every evening.   amoxicillin (AMOXIL) 500 MG capsule Take 4 capsules (2.'000mg'$ ) one hour before dental procedures.   Ascorbic Acid (CVS VITAMIN C PO) Take 1 tablet by mouth daily. 60 mg Vit C and 15 mg Zinc ( combo)   aspirin 81 MG tablet Take 81 mg by mouth daily.   benzonatate (TESSALON) 100 MG capsule Take 100 mg by mouth 3 (three) times daily as needed for cough.   carvedilol (COREG) 3.125 MG tablet TAKE 1 TABLET(3.125 MG) BY MOUTH TWICE DAILY WITH A MEAL   dapagliflozin propanediol (FARXIGA) 10 MG TABS tablet Take 1 tablet (10 mg total) by mouth daily.   furosemide (LASIX) 20 MG tablet Take 1 tablet (20 mg total) by mouth daily.   hydroxyurea (HYDREA) 500 MG capsule Take 2 capsules (1,000 mg total) by mouth daily. May take with food to minimize GI side effects.   levothyroxine (SYNTHROID) 75 MCG tablet Take 75 mcg by mouth daily before breakfast.   potassium chloride SA (KLOR-CON M) 10 MEQ tablet Take 1 tablet (10 mEq total) by mouth daily.   spironolactone (ALDACTONE) 25 MG tablet TAKE 1/2 TABLET(12.5 MG) BY MOUTH DAILY   traZODone (DESYREL) 50 MG tablet Take by mouth.   vitamin B-12 (CYANOCOBALAMIN) 1000 MCG tablet Take 1,000 mcg  by mouth daily.   Vitamin D, Ergocalciferol, (DRISDOL) 1.25 MG (50000 UNIT) CAPS capsule Take 50,000 Units by mouth every 7 (seven) days.   warfarin (COUMADIN) 5 MG tablet Take 0.5-1 tablets (2.5-5 mg total) by mouth See admin instructions. Take 1 tablet daily except 1/2 tablet on Mondays and Fridays   [DISCONTINUED] losartan (COZAAR) 25 MG tablet Take 0.5 tablets (12.5 mg total) by mouth  daily.   [DISCONTINUED] ranolazine (RANEXA) 500 MG 12 hr tablet Take 1 tablet (500 mg total) by mouth 2 (two) times daily.   No facility-administered encounter medications on file as of 06/05/2022.    ALLERGIES:  Allergies  Allergen Reactions   Erythromycin Rash   Rosuvastatin Calcium     Muscle cramps   Simvastatin     Muscle cramps   Zithromax [Azithromycin] Rash    LABORATORY DATA:  I have reviewed the labs as listed.  CBC    Component Value Date/Time   WBC 3.8 (L) 06/05/2022 1256   RBC 3.62 (L) 06/05/2022 1256   HGB 14.5 06/05/2022 1256   HGB 12.6 (L) 02/07/2018 1336   HCT 41.9 06/05/2022 1256   HCT 35.0 (L) 02/07/2018 1336   PLT 439 (H) 06/05/2022 1256   PLT 440 02/07/2018 1336   MCV 115.7 (H) 06/05/2022 1256   MCV 108 (H) 02/07/2018 1336   MCH 40.1 (H) 06/05/2022 1256   MCHC 34.6 06/05/2022 1256   RDW 16.0 (H) 06/05/2022 1256   RDW 15.7 (H) 02/07/2018 1336   LYMPHSABS 0.3 (L) 06/05/2022 1256   LYMPHSABS 0.7 02/07/2018 1336   MONOABS 0.3 06/05/2022 1256   EOSABS 0.1 06/05/2022 1256   EOSABS 0.1 02/07/2018 1336   BASOSABS 0.0 06/05/2022 1256   BASOSABS 0.0 02/07/2018 1336      Latest Ref Rng & Units 05/30/2022    1:55 PM 05/12/2022    2:11 PM 04/03/2022    9:48 AM  CMP  Glucose 70 - 99 mg/dL 81  85  96   BUN 8 - 23 mg/dL '26  21  21   '$ Creatinine 0.61 - 1.24 mg/dL 1.46  1.16  1.22   Sodium 135 - 145 mmol/L 137  135  139   Potassium 3.5 - 5.1 mmol/L 5.0  4.8  4.9   Chloride 98 - 111 mmol/L 101  100  102   CO2 22 - 32 mmol/L '28  26  31   '$ Calcium 8.9 - 10.3 mg/dL 9.2  9.1  9.2    Total Protein 6.5 - 8.1 g/dL   7.2   Total Bilirubin 0.3 - 1.2 mg/dL   0.8   Alkaline Phos 38 - 126 U/L   60   AST 15 - 41 U/L   27   ALT 0 - 44 U/L   16     DIAGNOSTIC IMAGING:  I have independently reviewed the relevant imaging and discussed with the patient.   WRAP UP:  I discussed the assessment and treatment plan with the patient. The patient was provided an opportunity to ask questions and all were answered. The patient agreed with the plan and demonstrated an understanding of the instructions.   The patient was advised to call or seek an in-person evaluation if the symptoms worsen or if the condition fails to improve as anticipated.  Medical decision making: Moderate  Time spent on visit: I spent 30 minutes in preparation for visit and in counseling patient via virtual video visit.  Harriett Rush, PA-C 06/05/22 2:20 PM

## 2022-06-05 NOTE — Patient Instructions (Addendum)
Taylor [name]  Discharge Instructions  You were seen today virtually by Tarri Abernethy, PA.  Taylor [name] talked with you about how you have been feeling lately.  She talked with you about your recent experience with dizziness.  Continue to follow up with your cardiologist regarding these symptoms.  It could be related to medications and/or your heart.  She wants you to continue taking the medications prescribed by the cardiology team.  She does not feel like any of those symptoms are related to your thrombocytosis that we follow you for.  Your platelets have significantly improved from where they were at.  They are 439 today.  Yes, that is still high but she doesn't want to make any changes to your Hydroxyurea today because she wants to see if you still get more response from your meds.  Continue taking Hydroxyurea 2 tablets daily.     Follow up:  - Continue taking Hydroxyurea 2 tablets daily - We will see you back in 2 months with labs on the same day   Thank you for choosing [facility] to provide your oncology and hematology care.   To afford each patient quality time with our provider, please arrive at least 15 minutes before your scheduled appointment time. You may need to reschedule your appointment if you arrive late (10 or more minutes). Arriving late affects you and other patients whose appointments are after yours.  Also, if you miss three or more appointments without notifying the office, you may be dismissed from the clinic at the provider's discretion.    Again, thank you for choosing [facility].  Our hope is that these requests will decrease the amount of time that you wait before being seen by our physicians.   If you have a lab appointment with the [facility] please come in thru the Main Entrance and check in at the main information desk.            _____________________________________________________________  Should you have questions after your visit to [facility], please contact our office at [phone] and follow the prompts.  Our office hours are 8:00 a.m. to 4:30 p.m. Monday - Thursday and 8:00 a.m. to 2:30 p.m. Friday.  Please note that voicemails left after 4:00 p.m. may not be returned until the following business day.  We are closed weekends and all major holidays.  You do have access to a nurse 24-7, just call the main number to the clinic [phone] and do not press any options, hold on the line and a nurse will answer the phone.    For prescription refill requests, have your pharmacy contact our office and allow 72 hours.    Masks are optional in the cancer centers. If you would like for your care team to wear a mask while they are taking care of you, please let them know. You may have one support person who is at least 73 years old accompany you for your appointments.

## 2022-06-07 ENCOUNTER — Ambulatory Visit: Payer: Medicare Other | Attending: Internal Medicine | Admitting: *Deleted

## 2022-06-07 DIAGNOSIS — Z5181 Encounter for therapeutic drug level monitoring: Secondary | ICD-10-CM | POA: Diagnosis not present

## 2022-06-07 DIAGNOSIS — Z952 Presence of prosthetic heart valve: Secondary | ICD-10-CM

## 2022-06-07 LAB — POCT INR: INR: 3.2 — AB (ref 2.0–3.0)

## 2022-06-07 NOTE — Patient Instructions (Signed)
Hold warfarin tonight then continue warfarin 1/2 tablet daily except 1 tablets on Sundays, Tuesdays and Thursdays Back up to Amiodarone '300mg'$  daily Continue Kale Recheck in 3 wks Coumadin Clinic [phone]

## 2022-06-13 ENCOUNTER — Telehealth: Payer: Self-pay | Admitting: Internal Medicine

## 2022-06-13 NOTE — Telephone Encounter (Signed)
New message  Pt would like to switch providers from Dr. Caryl Comes to Dr. Curt Bears. Is this switch approved?

## 2022-06-19 NOTE — Progress Notes (Incomplete)
***In Progress***    Advanced Heart Failure Clinic Note   PCP: Michell Heinrich, DO HF Cardiologist: Dr. Haroldine Laws    HPI: Taylor [name] is a 72 y.o. male with mechnical AVR on chronic coumadin, h/o VF arrest s/p ICD, CAD s/p CABG, chronic systolic HF (EF 99-83%), AAA and h/o essential thrombosis.    Has h/o congenital AoV abnormality with AI. In 1994 had MI and underwent PCI (unclear artery) and mechanical AVR at [facility].    Has h/o polysubstance abuse with cocaine, ETOH and tobacco abuse. No longer doing cocaine or smoking.    Did well from heart perspective until 2017, when he had VF arrest on 05/18/16. Found to have 3v CAD and MV disease and underwent CABG x 3 with re-do mechanical AVR. Post-operatively had AF (treated with amiodarone) and CHB requiring implantation of a BosSci CRT-D device. This is followed by Dr. Caryl Comes. Coumadin Clinic at [facility] street follows INR. INR goal 2.0-3.0.    Echo on 04/2017 showed LVEF of 25-30% stable AVR   Had CT on 04/2017 for his back. Showed 3.7cm AAA. Had f/u US 05/2018 that showed mild increase in size of the known distal fusiform abdominal aortic aneurysm, measuring 4.2 cm in maximum diameter. He was referred to [facility] and he is now followed by Dr. Oneida Alar.     Most recent echo 02/2019 showed EF 20-25%. RV normal.   He was admitted to [facility] on 05/2019 w/ acute cholecystitis and sepsis. Hypotensive on admit w/ AKI and HF meds held. SCr bumped to 1.7 (baseline 1.1). Treated w/ IVFs and abx. Blood cultures negative. General surgery consulted and plans were for percutaneous cholecystostomy w/ drain, however HIDA scan was negative and he improved symptomatically w/ conservative therapy.    Echo 02/03/21: EF 25-30%, AVR looks good.    In AFL 06/2021, EP unsuccessful with pace termination. Underwent DCCV to NSR (08/2021) but had ERAF about a week later.   Echo (09/2021) showed EF 25-30%, grade II DD, RV ok, s/p AVR with mild AI   S/p CTI ablation 11/2021 at [place] with Dr.  Glennon Mac. Patient called [facility] 8/23 with 3 ICD therapies. He notified Dr. Caryl Comes who increased amiodarone.   Saw EP 03/2022, dizzy spells improved since fixing antifreeze leak in his car. Device interrogation showed recurrent MMVT and amiodarone increased to 200 mg BID x 2 weeks then 200 mg daily.    On 05/12/22 he returned for HF follow up. Was overall feeling fine. Reported SOB walking up an incline. Reported no longer going to the gym due to dizziness. He was having 1 episode of dizziness a day, was told these episodes likely mean he is out of rhythm. He attributed these episodes to when he tried to reduce his amiodarone dose. Denied palpitations, CP, edema, or PND/Orthopnea. Appetite ok. No fever or chills. Weight at home 160-165 pounds. Reported taking all medications. Had inadvertently inhaled anti-freeze fumes in his truck, interestingly his dizzy spells mostly happened in his vehicle. Reported drinking 2 12 oz/beers a day and 1 glass of wine. No drug/tobacco use.  Today he returns to HF clinic for pharmacist medication titration. At last visit with APP Entresto was discontinued and losartan 12.5 mg nightly was initiated. Due to elevated SrCr on 05/30/22 labs, patient was instructed to hold spironolactone, losartan and Farxiga x 2 days, then resume at home dose.   Overall feeling ***. Dizziness, lightheadedness, fatigue:  Chest pain or palpitations:  How is your breathing?: *** SOB: Able to complete all ADLs.  Activity level ***  Weight at home pounds. Takes furosemide/torsemide/bumex *** mg *** daily.  LEE PND/Orthopnea  Appetite *** Low-salt diet:   Physical Exam Cost/affordability of meds   HF Medications: Carvedilol 3.125 mg daily Losartan 25 mg QHS Spironolactone 12.5 mg daily Farxiga 10 mg daily Lasix 20 mg daily  Has the patient been experiencing any side effects to the medications prescribed?  {YES NO:22349}  Does the patient have any problems obtaining medications due to  transportation or finances?   {YES NO:22349}  Understanding of regimen: {excellent/good/fair/poor:19665} Understanding of indications: {excellent/good/fair/poor:19665} Potential of compliance: {excellent/good/fair/poor:19665} Patient understands to avoid NSAIDs. Patient understands to avoid decongestants.    Pertinent Lab Values: Serum creatinine ***, BUN ***, Potassium ***, Sodium ***, BNP ***, Magnesium ***, Digoxin ***   Vital Signs: Weight: *** (last clinic weight: ***) Blood pressure: ***  Heart rate: ***   Assessment/Plan: 1. Chronic Systolic Heart Failure - Echo (05/2016 @ [facility]): EF 40% - Echo (12/18) EF 25-30% - Echo (12/20): EF 20-25%. Diffuse hypokinesis, No LVH. RV normal. Mild AI, trace MR - s/p CRT-D.  - Echo 5/23 EF 25-30% - NYHA II, limited by dizziness. He is not volume overloaded on exam. - With dizziness, stop Entresto and start losartan 12.5 mg qhs. - Continue Farxiga 10 mg daily. - Continue Coreg 3.125 mg bid.  - Continue spiro 12.5 mg daily.  - Labs today.   2. CAD - h/o remote PCI in 1994 at [place] - s/p [facility] in 2017 in the setting of Vfib Arrest w/ 3V CAD treated w/ CABG x 3 - No chest pain. - Continue ASA, statin and ? blocker. - Goal LDL < 70    4. Mechanical Aortic Valve - Stable on recent echo. - On coumadin. INRs followed by [facility] - No bleeding. - Reminded of SBE prophylaxis (has dentures)   5. H/o VF Arrest and H/o CHB - s/p BosSci CRT-D device, followed by EP (Dr. Caryl Comes) - Continue amiodarone 100 qam/200qpm, per EP. I don't think his dizziness is related to his amiodarone as he just self-adjusted and dizziness remains. - VT on 04/12/22 with ATP x 1, by device interrogation.   6. AAA  - CT scan 1/21, now measuring at 4.6 x 4.5 cm (nonruptured) - Followed by [facility], AAA increased from 4.6 cm --> 4.9 - Vascular planning repeat duplex in 6 months. - BP and HR controlled w/ ? blocke.r - Continue statin + ASA   7. Cholelithiasis   - Previous admission for acute cholecystitis/ sepsis. HIDA scan negative. Improved w/ conservative therapy w/ abx and bowel rest.  - Has seen [facility]. Conservative therapy for now - No change for now   8. H/o substance abuse - Advised he cut back on ETOH  Follow up in 3 months with Dr. Haroldine Laws.    Audry Riles, PharmD, BCPS, BCCP, CPP Heart Failure Clinic Pharmacist [phone]

## 2022-06-20 ENCOUNTER — Ambulatory Visit (HOSPITAL_COMMUNITY)
Admission: RE | Admit: 2022-06-20 | Discharge: 2022-06-20 | Disposition: A | Discharge status: Home / Self Care | Payer: Medicare Other | Source: Ambulatory Visit | Attending: Cardiology | Admitting: Cardiology

## 2022-06-20 VITALS — BP 132/68 | HR 59 | Wt 162.8 lb

## 2022-06-20 DIAGNOSIS — I5022 Chronic systolic (congestive) heart failure: Secondary | ICD-10-CM

## 2022-06-20 DIAGNOSIS — I2510 Atherosclerotic heart disease of native coronary artery without angina pectoris: Secondary | ICD-10-CM | POA: Diagnosis not present

## 2022-06-20 DIAGNOSIS — F1910 Other psychoactive substance abuse, uncomplicated: Secondary | ICD-10-CM | POA: Insufficient documentation

## 2022-06-20 DIAGNOSIS — K8020 Calculus of gallbladder without cholecystitis without obstruction: Secondary | ICD-10-CM | POA: Diagnosis not present

## 2022-06-20 DIAGNOSIS — Z9861 Coronary angioplasty status: Secondary | ICD-10-CM | POA: Insufficient documentation

## 2022-06-20 DIAGNOSIS — I714 Abdominal aortic aneurysm, without rupture, unspecified: Secondary | ICD-10-CM | POA: Diagnosis not present

## 2022-06-20 DIAGNOSIS — I110 Hypertensive heart disease with heart failure: Secondary | ICD-10-CM | POA: Insufficient documentation

## 2022-06-20 NOTE — Patient Instructions (Signed)
It was a pleasure seeing you today!  MEDICATIONS: -No medication changes today -Call if you have questions about your medications.   NEXT APPOINTMENT: Return to clinic in 3 months with Dr. Haroldine Laws. Call [phone] to schedule appointment in late February.  In general, to take care of your heart failure: -Limit your fluid intake to 2 Liters (half-gallon) per day.   -Limit your salt intake to ideally 2-3 grams (2000-3000 mg) per day. -Weigh yourself daily and record, and bring that "weight diary" to your next appointment.  (Weight gain of 2-3 pounds in 1 day typically means fluid weight.) -The medications for your heart are to help your heart and help you live longer.   -Please contact us before stopping any of your heart medications.  Call the clinic at [phone] with questions or to reschedule future appointments.

## 2022-06-20 NOTE — Progress Notes (Cosign Needed Addendum)
Advanced Heart Failure Clinic Note   PCP: Michell Heinrich, DO HF Cardiologist: Dr. Haroldine Laws    HPI: Taylor [name] is a 72 y.o. male with mechanical AVR on chronic coumadin, h/o VF arrest s/p ICD, CAD s/p CABG, chronic systolic HF (EF 81-85%), AAA and h/o essential thrombosis.    Has h/o congenital AoV abnormality with AI. In 1994 had MI and underwent PCI (unclear artery) and mechanical AVR at [facility]    Has h/o polysubstance abuse with cocaine, ETOH and tobacco abuse. No longer doing cocaine or smoking.    Did well from heart perspective until 2017, when he had VF arrest on 05/18/16. Found to have 3v CAD and MV disease and underwent CABG x 3 with re-do mechanical AVR. Post-operatively had AF (treated with amiodarone) and CHB requiring implantation of a BosSci CRT-D device. This is followed by Dr. Curt Bears (previously Dr. Caryl Comes). Coumadin Clinic at [facility] street follows INR. INR goal 2.0-3.0.    Echo on 04/2017 showed LVEF of 25-30% stable AVR.   Had CT on 04/2017 for his back. Showed 3.7cm AAA. Had f/u US 05/2018 that showed mild increase in size of the known distal fusiform abdominal aortic aneurysm, measuring 4.2 cm in maximum diameter. He was referred to [facility] and he is now followed by Dr. Oneida Alar.     Echo 02/2019: EF 20-25%. RV normal.   He was admitted to [facility] on 05/2019 w/ acute cholecystitis and sepsis. Hypotensive on admit w/ AKI and HF meds held. SCr bumped to 1.7 (baseline 1.1). Treated w/ IVFs and abx. Blood cultures negative. General surgery consulted and plans were for percutaneous cholecystostomy w/ drain, however HIDA scan was negative and he improved symptomatically w/ conservative therapy.    Echo 02/03/21: EF 25-30%, AVR looks good.    In AFL 06/2021, EP unsuccessful with pace termination. Underwent DCCV to NSR (08/2021) but had ERAF about a week later.   Echo 09/2021: EF 25-30%, grade II DD, RV ok, s/p AVR with mild AI   S/p CTI ablation 11/2021 at [place] with Dr. Glennon Mac. Patient  called [facility] 8/23 with 3 ICD therapies. He notified Dr. Caryl Comes who increased amiodarone.   Saw EP 03/2022, dizzy spells improved since fixing antifreeze leak in his car. Device interrogation showed recurrent MMVT and amiodarone increased to 200 mg BID x 2 weeks then 200 mg daily.    On 05/12/22 he returned for HF follow up. Was overall feeling fine. Reported SOB walking up an incline. Reported no longer going to the gym due to dizziness. He was having 1 episode of dizziness a day, was told these episodes likely mean he is out of rhythm. He attributed these episodes to when he tried to reduce his amiodarone dose. Denied palpitations, CP, edema, or PND/Orthopnea. Appetite ok. No fever or chills. Weight at home 160-165 pounds. Reported taking all medications. Had inadvertently inhaled anti-freeze fumes in his truck, interestingly his dizzy spells mostly happened in his vehicle. Reported drinking 2 12 oz/beers a day and 1 glass of wine. No drug/tobacco use.  Today he returns to HF clinic for pharmacist medication education. At last visit with APP Entresto was discontinued and losartan 12.5 mg nightly was initiated. Lasix 20 mg daily was initiated due to elevated BNP on 05/12/22 labs. Due to elevated SCr on 05/30/22 labs, patient was instructed to hold spironolactone, losartan and Farxiga x 2 days, then resume at home dose. At 06/05/22 oncology visit patient was instructed to discontinue losartan and ranolazine due to dizziness. Today  he reports doing well. Denies any more dizzy spells, feels this has resolved since discontinuation of the Entresto and losartan. He denies lightheadedness or fatigue. Endorses improved energy since diet changes. Reports exercising at the gym again, denies SOB with activity. He is not taking any extra Lasix. Patient's primary concern today was questions regarding his regimen and potential side effects.  HF Medications: Carvedilol 3.125 mg BID Spironolactone 12.5 mg daily Farxiga 10 mg  daily Lasix 20 mg daily  Has the patient been experiencing any side effects to the medications prescribed?  No - dizziness has resolved since Entresto/losartan were stopped.  Does the patient have any problems obtaining medications due to transportation or finances?  No, coverage through [employer].  Understanding of regimen: good Understanding of indications: good Potential of compliance: good Patient understands to avoid NSAIDs. Patient understands to avoid decongestants.    Pertinent Lab Values: 06/05/22: Serum creatinine 1.23, BUN 25, Potassium 4.3, Sodium 132   Vital Signs: Weight: 162.8 lbs (last clinic weight: 162 lbs) Blood pressure: 132/68  Heart rate: 59   Assessment/Plan: 1. Chronic Systolic Heart Failure - Echo (05/2016 @ [facility]): EF 40% - Echo (04/2017) EF 25-30% - Echo (04/2019): EF 20-25%. Diffuse hypokinesis, No LVH. RV normal. Mild AI, trace MR - s/p CRT-D.  - Echo 09/2021 EF 25-30% - NYHA II. He is not volume overloaded on exam. - Patient asked many good questions regarding HF diagnosis and medications today. Provided complete medication and disease state review. - Plans to follow up with AHF clinic in 3 months, plan to make medication changes at this time if patient remains stable. - Continue Lasix 20 mg daily - Continue carvedilol 3.125 mg BID.  - Continue dapagliflozin 10 mg daily. - Continue spironolactone 12.5 mg daily.  - Patient encouraged to check blood pressure at home and bring readings to next visit with MD.   2. CAD - h/o remote PCI in 1994 at [facility] - s/p [place] in 2017 in the setting of Vfib Arrest w/ 3V CAD treated w/ CABG x 3 - No chest pain. - Continue ASA, statin and ? blocker. - Goal LDL < 70    4. Mechanical Aortic Valve - Stable on recent echo. - On coumadin. INRs followed by [facility] Coumadin Clinic - No bleeding. - Reminded of SBE prophylaxis (has dentures)   5. H/o VF Arrest and H/o CHB - s/p BosSci CRT-D device, followed  by EP (Dr. Curt Bears) - Continue amiodarone 100 mg qam and 200 mg qpm, per EP.  - VT on 04/12/22 with ATP x 1, by device interrogation.   6. AAA  - CT scan 05/2019, now measuring at 4.6 x 4.5 cm (nonruptured) - Followed by [facility], AAA increased from 4.6 cm --> 4.9 - Vascular planning repeat duplex in 6 months. - BP and HR controlled w/ ? blocker - Continue statin + ASA   7. Cholelithiasis  - Previous admission for acute cholecystitis/sepsis. HIDA scan negative. Improved w/ conservative therapy w/ abx and bowel rest.  - Has seen [facility]. Conservative therapy for now - No change    8. H/o substance abuse - Advised he cut back on ETOH  Follow up in 3 months with Dr. Haroldine Laws + echo.   Audry Riles, PharmD, BCPS, BCCP, CPP Heart Failure Clinic Pharmacist [phone]

## 2022-06-26 ENCOUNTER — Other Ambulatory Visit: Payer: Self-pay | Admitting: *Deleted

## 2022-06-26 DIAGNOSIS — I714 Abdominal aortic aneurysm, without rupture, unspecified: Secondary | ICD-10-CM

## 2022-06-28 ENCOUNTER — Ambulatory Visit: Payer: Medicare Other | Attending: Internal Medicine | Admitting: *Deleted

## 2022-06-28 DIAGNOSIS — Z952 Presence of prosthetic heart valve: Secondary | ICD-10-CM

## 2022-06-28 DIAGNOSIS — Z5181 Encounter for therapeutic drug level monitoring: Secondary | ICD-10-CM

## 2022-06-28 LAB — POCT INR: INR: 2.3 (ref 2.0–3.0)

## 2022-06-28 NOTE — Patient Instructions (Signed)
Continue warfarin 1/2 tablet daily except 1 tablets on Sundays, Tuesdays and Thursdays Back up to Amiodarone '300mg'$  daily Continue Kale Recheck in 3 wks Coumadin Clinic [phone]

## 2022-07-03 NOTE — Progress Notes (Unsigned)
Electrophysiology Office Note Date: 07/04/2022  ID:  Taylor [name], DOB 12/16/1950, MRN [id number]  PCP: Michell Heinrich, DO Primary Cardiologist: None Electrophysiologist: Virl Axe, MD   CC: Routine ICD follow-up  Taylor [name] is a 72 y.o. male seen today for Virl Axe, MD for routine electrophysiology followup. Since last being seen in our clinic the patient reports doing well. He is no longer driving, and is also limited his alcohol intake. He denies chest pain, palpitations, dyspnea, PND, orthopnea, nausea, vomiting, dizziness, syncope, edema, weight gain, or early satiety.   He has not had ICD shocks.   Device History: BSci CRT-D implanted 06/02/2016 + appropriate therapies   AAD hx Amiodarone started 2018, doing has been pulsed at times for VT  Past Medical History:  Diagnosis Date   Atherosclerotic heart disease of native coronary artery without angina pectoris    Chronic anticoagulation 11/25/5282   Chronic systolic heart failure ([place]) 02/02/2017   Essential thrombocythemia ([place]) 02/15/2017   Hyperlipidemia    Hypertension    Ischemic cardiomyopathy    Leg pain, central, left 04/17/2017   Hx sciatica - pain similar; no red flag signs on hx or exam    Presence of other heart-valve replacement    Ventricular tachycardia ([facility])     Current Outpatient Medications  Medication Instructions   amiodarone (PACERONE) 100 mg, Oral, Every morning   amiodarone (PACERONE) 200 mg, Oral, Every evening   amoxicillin (AMOXIL) 500 MG capsule Take 4 capsules (2.'000mg'$ ) one hour before dental procedures.   Ascorbic Acid ([employer] VITAMIN C PO) 1 tablet, Oral, Daily, 60 mg Vit C and 15 mg Zinc ( combo)   aspirin 81 mg, Oral, Daily   benzonatate (TESSALON) 100 mg, Oral, 3 times daily PRN   carvedilol (COREG) 3.125 MG tablet TAKE 1 TABLET(3.125 MG) BY MOUTH TWICE DAILY WITH A MEAL   cyanocobalamin (VITAMIN B12) 1,000 mcg, Oral, Daily   dapagliflozin propanediol (FARXIGA) 10 mg, Oral, Daily    furosemide (LASIX) 20 mg, Oral, Daily   hydroxyurea (HYDREA) 1,000 mg, Oral, Daily, May take with food to minimize GI side effects.   ipratropium (ATROVENT) 0.06 % nasal spray 2 sprays, Each Nare, 3 times daily   levothyroxine (SYNTHROID) 75 mcg, Oral, Daily before breakfast   potassium chloride SA (KLOR-CON M) 10 MEQ tablet 10 mEq, Oral, Daily   spironolactone (ALDACTONE) 25 MG tablet TAKE 1/2 TABLET(12.5 MG) BY MOUTH DAILY   traZODone (DESYREL) 50 MG tablet Oral   Vitamin D (Ergocalciferol) (DRISDOL) 50,000 Units, Oral, Daily   warfarin (COUMADIN) 2.5-5 mg, Oral, See admin instructions, Take 1 tablet daily except 1/2 tablet on Mondays and Fridays    Family History: Family History  Problem Relation Age of Onset   Hypertension Father     Physical Exam: Vitals:   07/04/22 1143  BP: 120/72  Pulse: 63  SpO2: 96%  Weight: 161 lb 6.4 oz (73.2 kg)  Height: '5\' 9"'$  (1.753 m)     GEN- NAD. A&O x 3. Normal affect. HEENT: Normocephalic, atraumatic Lungs- CTAB, Normal effort.  Heart- Regular rate and rhythm rate and rhythm. No M/G/R.  Extremities- No peripheral edema. no clubbing or cyanosis Skin- warm and dry, no rash or lesion; ICD pocket well healed  ICD interrogation- from last visit reviewed. Device briefly turned on which showed no further VT episodes. Not formally interrogated given recent stable check.   EKG is not ordered today  Other studies Reviewed: Additional studies/ records that were reviewed today include: Previous EP  office notes.   Assessment and Plan:  1.  Chronic systolic dysfunction s/p Chemical engineer CRT-D  euvolemic today Stable on an appropriate medical regimen Normal ICD function See Pace Art report No changes today  2. H/o VF Arrest 3. VT Continue amiodarone  Continue mexitil 500 mg BID  4. H/o Mechanical AV 5. H/o MV replacement Continue anticoagulation  6. H/o Fib/flutter Flutter ablated at [facility] 11/2021 On coumadin  7. CAD Denies s/s  ischemia  Current medicines are reviewed at length with the patient today.    Labs/ tests ordered today include:  Orders Placed This Encounter  Procedures   Comprehensive metabolic panel   TSH   T4, free     Disposition:   Follow up with Dr. Curt Bears in 6 months at pt request (OK per Caryl Comes and [facility])   Signed, Shirley Friar, PA-C  07/04/2022 12:18 PM  [address] [place] [address] [phone] (office) [phone] (fax)

## 2022-07-04 ENCOUNTER — Ambulatory Visit: Payer: Medicare Other | Attending: Student | Admitting: Student

## 2022-07-04 ENCOUNTER — Encounter: Payer: Self-pay | Admitting: Student

## 2022-07-04 VITALS — BP 120/72 | HR 63 | Ht 69.0 in | Wt 161.4 lb

## 2022-07-04 DIAGNOSIS — I4901 Ventricular fibrillation: Secondary | ICD-10-CM

## 2022-07-04 DIAGNOSIS — I4892 Unspecified atrial flutter: Secondary | ICD-10-CM

## 2022-07-04 DIAGNOSIS — I5022 Chronic systolic (congestive) heart failure: Secondary | ICD-10-CM

## 2022-07-04 DIAGNOSIS — I255 Ischemic cardiomyopathy: Secondary | ICD-10-CM

## 2022-07-04 DIAGNOSIS — Z952 Presence of prosthetic heart valve: Secondary | ICD-10-CM

## 2022-07-04 DIAGNOSIS — I472 Ventricular tachycardia, unspecified: Secondary | ICD-10-CM | POA: Diagnosis not present

## 2022-07-04 NOTE — Patient Instructions (Signed)
Medication Instructions:  .isntcurr  *If you need a refill on your cardiac medications before your next appointment, please call your pharmacy*   Lab Work: TODAY: CMET, TSH, FreeT4  If you have labs (blood work) drawn today and your tests are completely normal, you will receive your results only by: [place] (if you have MyChart) OR A paper copy in the mail If you have any lab test that is abnormal or we need to change your treatment, we will call you to review the results.   Follow-Up: At [facility], you and your health needs are our priority.  As part of our continuing mission to provide you with exceptional heart care, we have created designated Provider Care Teams.  These Care Teams include your primary Cardiologist (physician) and Advanced Practice Providers (APPs -  Physician Assistants and Nurse Practitioners) who all work together to provide you with the care you need, when you need it.  Your next appointment:   6 month(s)  Provider:   Allegra Lai, MD

## 2022-07-05 ENCOUNTER — Other Ambulatory Visit: Payer: Self-pay | Admitting: Vascular Surgery

## 2022-07-05 ENCOUNTER — Other Ambulatory Visit: Payer: Self-pay

## 2022-07-05 DIAGNOSIS — I5022 Chronic systolic (congestive) heart failure: Secondary | ICD-10-CM

## 2022-07-05 DIAGNOSIS — I472 Ventricular tachycardia, unspecified: Secondary | ICD-10-CM

## 2022-07-05 DIAGNOSIS — I714 Abdominal aortic aneurysm, without rupture, unspecified: Secondary | ICD-10-CM

## 2022-07-05 DIAGNOSIS — Z79899 Other long term (current) drug therapy: Secondary | ICD-10-CM

## 2022-07-05 LAB — COMPREHENSIVE METABOLIC PANEL
ALT: 15 IU/L (ref 0–44)
AST: 26 IU/L (ref 0–40)
Albumin/Globulin Ratio: 1.6 (ref 1.2–2.2)
Albumin: 4.3 g/dL (ref 3.8–4.8)
Alkaline Phosphatase: 75 IU/L (ref 44–121)
BUN/Creatinine Ratio: 17 (ref 10–24)
BUN: 24 mg/dL (ref 8–27)
Bilirubin Total: 0.5 mg/dL (ref 0.0–1.2)
CO2: 22 mmol/L (ref 20–29)
Calcium: 9.3 mg/dL (ref 8.6–10.2)
Chloride: 99 mmol/L (ref 96–106)
Creatinine, Ser: 1.44 mg/dL — ABNORMAL HIGH (ref 0.76–1.27)
Globulin, Total: 2.7 g/dL (ref 1.5–4.5)
Glucose: 89 mg/dL (ref 70–99)
Potassium: 5.2 mmol/L (ref 3.5–5.2)
Sodium: 136 mmol/L (ref 134–144)
Total Protein: 7 g/dL (ref 6.0–8.5)
eGFR: 52 mL/min/{1.73_m2} — ABNORMAL LOW (ref >59)

## 2022-07-05 LAB — T4, FREE: Free T4: 1.8 ng/dL — ABNORMAL HIGH (ref 0.82–1.77)

## 2022-07-05 LAB — TSH: TSH: 4.62 u[IU]/mL — ABNORMAL HIGH (ref 0.450–4.500)

## 2022-07-07 ENCOUNTER — Encounter: Payer: Self-pay | Admitting: Internal Medicine

## 2022-07-10 ENCOUNTER — Ambulatory Visit: Payer: Medicare Other | Admitting: Physician Assistant

## 2022-07-10 ENCOUNTER — Ambulatory Visit (HOSPITAL_COMMUNITY)
Admission: RE | Admit: 2022-07-10 | Discharge: 2022-07-10 | Disposition: A | Discharge status: Home / Self Care | Payer: Medicare Other | Source: Ambulatory Visit | Attending: Surgery | Admitting: Surgery

## 2022-07-10 VITALS — BP 125/72 | HR 59 | Temp 97.5°F | Resp 20 | Ht 69.0 in | Wt 156.0 lb

## 2022-07-10 DIAGNOSIS — I714 Abdominal aortic aneurysm, without rupture, unspecified: Secondary | ICD-10-CM

## 2022-07-10 NOTE — Progress Notes (Addendum)
[facility] [place] HISTORY AND PHYSICAL   History of Present Illness:  Patient is a 72 y.o. year old male who presents for evaluation of AAA.  He has been followed for around 3 years for this asymptomatic AAA.    He denise rest pain, non healing wounds or claudication symptoms with ambulation.    He is medically managed on ASA, Coumadin for cardiac issues and he is unable to tolerate Statins. He also has a left-sided AICD. Other chronic medical problems include hyperlipidemia and hypertension both of which have been stable. He does have a history of aortic valve replacement and is on warfarin.         Past Medical History:  Diagnosis Date   Atherosclerotic heart disease of native coronary artery without angina pectoris    Chronic anticoagulation 123XX123   Chronic systolic heart failure ([place]) 02/02/2017   Essential thrombocythemia ([place]) 02/15/2017   Hyperlipidemia    Hypertension    Ischemic cardiomyopathy    Leg pain, central, left 04/17/2017   Hx sciatica - pain similar; no red flag signs on hx or exam    Presence of other heart-valve replacement    Ventricular tachycardia (HCC)     Past Surgical History:  Procedure Laterality Date   BIV ICD INSERTION CRT-D     CARDIOVERSION N/A 10/21/2020   Procedure: CARDIOVERSION;  Surgeon: Lelon Perla, MD;  Location: [facility];  Service: Cardiovascular;  Laterality: N/A;   CARDIOVERSION N/A 09/22/2021   Procedure: CARDIOVERSION;  Surgeon: Sueanne Margarita, MD;  Location: [facility];  Service: Cardiovascular;  Laterality: N/A;   CORONARY ARTERY BYPASS GRAFT  2017   MECHANICAL AORTIC VALVE REPLACEMENT     TEE WITHOUT CARDIOVERSION N/A 10/21/2020   Procedure: TRANSESOPHAGEAL ECHOCARDIOGRAM (TEE);  Surgeon: Lelon Perla, MD;  Location: [facility] ENDOSCOPY;  Service: Cardiovascular;  Laterality: N/A;    ROS:   General:  No weight loss, Fever, chills  HEENT: No recent headaches, no nasal bleeding, no visual  changes, no sore throat  Neurologic: No dizziness, blackouts, seizures. No recent symptoms of stroke or mini- stroke. No recent episodes of slurred speech, or temporary blindness.  Cardiac: No recent episodes of chest pain/pressure, no shortness of breath at rest.  No shortness of breath with exertion.  Denies history of atrial fibrillation or irregular heartbeat  Vascular: No history of rest pain in feet.  No history of claudication.  No history of non-healing ulcer, No history of DVT   Pulmonary: No home oxygen, no productive cough, no hemoptysis,  No asthma or wheezing  Musculoskeletal:  [ ]$  Arthritis, [ ]$  Low back pain,  [ ]$  Joint pain  Hematologic:No history of hypercoagulable state.  No history of easy bleeding.  No history of anemia  Gastrointestinal: No hematochezia or melena,  No gastroesophageal reflux, no trouble swallowing  Urinary: [ x] chronic Kidney disease, [ ]$  on HD - [ ]$  MWF or [ ]$  TTHS, [ ]$  Burning with urination, [ ]$  Frequent urination, [ ]$  Difficulty urinating;   Skin: No rashes  Psychological: No history of anxiety,  No history of depression  Social History Social History   Tobacco Use   Smoking status: Former    Packs/day: 1.00    Years: 30.00    Total pack years: 30.00    Types: Cigarettes    Start date: 05/26/1979    Quit date: 05/18/2016    Years since quitting: 6.1    Passive exposure: Never   Smokeless tobacco:  Never  Vaping Use   Vaping Use: Never used  Substance Use Topics   Alcohol use: Yes    Alcohol/week: 10.0 standard drinks of alcohol    Types: 10 Cans of beer per week    Comment: 1 can a day..   Drug use: No    Family History Family History  Problem Relation Age of Onset   Hypertension Father     Allergies  Allergies  Allergen Reactions   Erythromycin Rash   Rosuvastatin Calcium     Muscle cramps   Simvastatin     Muscle cramps   Zithromax [Azithromycin] Rash     Current Outpatient Medications  Medication Sig  Dispense Refill   amiodarone (PACERONE) 100 MG tablet Take 100 mg by mouth in the morning.     amiodarone (PACERONE) 200 MG tablet Take 200 mg by mouth every evening.     amoxicillin (AMOXIL) 500 MG capsule Take 4 capsules (2.053m) one hour before dental procedures. 4 capsule 0   Ascorbic Acid (CVS VITAMIN C PO) Take 1 tablet by mouth daily. 60 mg Vit C and 15 mg Zinc ( combo)     aspirin 81 MG tablet Take 81 mg by mouth daily.     benzonatate (TESSALON) 100 MG capsule Take 100 mg by mouth 3 (three) times daily as needed for cough.     carvedilol (COREG) 3.125 MG tablet TAKE 1 TABLET(3.125 MG) BY MOUTH TWICE DAILY WITH A MEAL 180 tablet 3   dapagliflozin propanediol (FARXIGA) 10 MG TABS tablet Take 1 tablet (10 mg total) by mouth daily. 90 tablet 3   furosemide (LASIX) 20 MG tablet Take 1 tablet (20 mg total) by mouth daily. 30 tablet 6   hydroxyurea (HYDREA) 500 MG capsule Take 2 capsules (1,000 mg total) by mouth daily. May take with food to minimize GI side effects. 180 capsule 2   ipratropium (ATROVENT) 0.06 % nasal spray Place 2 sprays into both nostrils 3 (three) times daily.     levothyroxine (SYNTHROID) 75 MCG tablet Take 75 mcg by mouth daily before breakfast.     potassium chloride SA (KLOR-CON M) 10 MEQ tablet Take 1 tablet (10 mEq total) by mouth daily. 30 tablet 6   spironolactone (ALDACTONE) 25 MG tablet TAKE 1/2 TABLET(12.5 MG) BY MOUTH DAILY 45 tablet 1   traZODone (DESYREL) 50 MG tablet Take by mouth.     vitamin B-12 (CYANOCOBALAMIN) 1000 MCG tablet Take 1,000 mcg by mouth daily.     Vitamin D, Ergocalciferol, (DRISDOL) 1.25 MG (50000 UNIT) CAPS capsule Take 50,000 Units by mouth daily.     warfarin (COUMADIN) 5 MG tablet Take 0.5-1 tablets (2.5-5 mg total) by mouth See admin instructions. Take 1 tablet daily except 1/2 tablet on Mondays and Fridays 90 tablet 1   No current facility-administered medications for this visit.    Physical Examination  Vitals:   07/10/22 1007   BP: 125/72  Pulse: (!) 59  Resp: 20  Temp: (!) 97.5 F (36.4 C)  TempSrc: Temporal  SpO2: 96%  Weight: 156 lb (70.8 kg)  Height: 5' 9"$  (1.753 m)    Body mass index is 23.04 kg/m.  General:  Alert and oriented, no acute distress HEENT: Normal Neck: No bruit or JVD Pulmonary: Clear to auscultation bilaterally Cardiac: Regular Rate and Rhythm without murmur Abdomen: Soft, non-tender, non-distended, no mass, no scars Skin: No rash Extremity Pulses:   radial and femorals   pulses bilaterally Musculoskeletal: No deformity or edema  Neurologic:  Upper and lower extremity motor 5/5 and symmetric  DATA:  Abdominal Aorta Findings:  +-------------+-------+----------+----------+----------+--------+--------+  Location    AP (cm)Trans (cm)PSV (cm/s)Waveform  ThrombusComments  +-------------+-------+----------+----------+----------+--------+--------+  Proximal    2.84   2.99      57                                    +-------------+-------+----------+----------+----------+--------+--------+  Mid         2.48   2.64      53                                    +-------------+-------+----------+----------+----------+--------+--------+  Distal      4.98   5.11      18                                    +-------------+-------+----------+----------+----------+--------+--------+  RT CIA Prox  1.5    1.6       108                                   +-------------+-------+----------+----------+----------+--------+--------+  RT CIA Mid   1.8    1.7       68        monophasic                  +-------------+-------+----------+----------+----------+--------+--------+  RT EIA Distal                 105       monophasic                  +-------------+-------+----------+----------+----------+--------+--------+  LT CIA Prox  1.4    1.5       89        monophasic                   +-------------+-------+----------+----------+----------+--------+--------+  LT CIA Mid   2.2    2.0       56        monophasic                  +-------------+-------+----------+----------+----------+--------+--------+  LT EIA Distal                 51        monophasic                  +-------------+-------+----------+----------+----------+--------+--------+      Summary:  Abdominal Aorta: There is evidence of abnormal dilatation of the distal  Abdominal aorta. There is evidence of abnormal dilation of the Right  Common Iliac artery and Left Common Iliac artery. The largest aortic  diameter remains essentially unchanged  compared to prior exam. Slight increase in the transverse measurement.   ASSESSMENT/PLAN:  Asymptomatic AAA with increased size of 5.11 cm this has increased from 4.6 cm in 1 year. He denise rest pain, claudication or non healing wounds.  He is staying active and going to a gym.  He denise increased lumbar or abdominal pain.  I will schedule him for a CTA of the aorta and f/u with DR. Brabham to review and discuss the CTA findings.   The patient is  a little nervous and would like to know how large it is base off a CTA for more detailed imaging.  I reassured him he can do activities as tolerates including the gym.          Roxy Horseman PA-C [facility] [place] Office: [phone]  MD in clinic [place]

## 2022-07-14 ENCOUNTER — Telehealth: Payer: Self-pay | Admitting: Internal Medicine

## 2022-07-14 NOTE — Telephone Encounter (Signed)
Pt c/o medication issue:  1. Name of Medication:   amiodarone (PACERONE) 100 MG tablet    2. How are you currently taking this medication (dosage and times per day)?    3. Are you having a reaction (difficulty breathing--STAT)? no  4. What is your medication issue? Needs a prior auth for this medication

## 2022-07-17 MED ORDER — AMIODARONE HCL 200 MG PO TABS: ORAL_TABLET | ORAL | 3 refills | Status: DC

## 2022-07-17 NOTE — Telephone Encounter (Signed)
**Note De-Identified Taylor [name] Obfuscation** Amiodarone 100 mg-take 1 tablet in the morning and Amiodarone 200 mg -Take 1 tablet in the PM removed from the pts med list.  Amiodarone 200 mg-changed to take 1/2 tablet (100 mg) in the am and 1 tablet (200 mg) at night.  I have e-scribed to [id number] [place], [place] - [place] AT [facility] (Pharmacy).  I then called [facility] and s/w the pharmacist who states that the RX is now going through as covered and she thanked me for correcting the pts Amiodarone dose in his med list as the pts ins plan does not cover 100 mg dose at all and not 2 RXs with different doses when 1 dose can be split in two which requires less tablets per fill.

## 2022-07-19 ENCOUNTER — Ambulatory Visit: Payer: Medicare Other | Attending: Internal Medicine | Admitting: *Deleted

## 2022-07-19 DIAGNOSIS — Z952 Presence of prosthetic heart valve: Secondary | ICD-10-CM | POA: Diagnosis not present

## 2022-07-19 DIAGNOSIS — Z5181 Encounter for therapeutic drug level monitoring: Secondary | ICD-10-CM

## 2022-07-19 LAB — POCT INR: INR: 4.3 — AB (ref 2.0–3.0)

## 2022-07-19 NOTE — Patient Instructions (Signed)
Hold warfarin tonight then decrease dose to 1/2 tablet except 1 tablet on Tuesdays and Fridays Back up to Amiodarone 357m daily Continue [facility]  Call pt with results and instructions. [facility]in [facility] [phone]

## 2022-08-02 NOTE — Progress Notes (Unsigned)
Taylor [name], [place] [address]   CLINIC:  Medical Oncology/Hematology  PCP:  Michell Heinrich, DO [address] [place] [address] [phone]   REASON FOR VISIT:  Follow-up for essential thrombocytosis   CURRENT THERAPY: Hydrea 1000 mg daily   INTERVAL HISTORY:   Taylor [name] 72 y.o. male returns for routine follow-up of his essential thrombocytosis.  He was last evaluated via video visit by Tarri Abernethy PA-C on 06/05/2022.  At today's visit, he reports feeling fairly well.  No recent hospitalizations, surgeries, or changes in baseline health status.  He is tolerating Hydrea well.  He does note some dry scaly lesions on his forehead that have been coming and going for the past year.  He reports that they will bleed only if he scratches at them and will heal fairly easily before reappearing again.  He denies any other skin lesions, rashes, mouth sores, or GI side effects.  He reports chronic tinnitus for the past 4-5 years.  He has very rare episodes of transient blurry vision.  He denies any peripheral neuropathy, Raynaud's, or erythromelalgia.  No fever, chills, night sweats, or unintentional weight loss. Denies any abdominal pain, nausea, early satiety.  He has 100% energy and 100% appetite. He endorses that he is maintaining a stable weight.   ASSESSMENT & PLAN:  1.  Essential thrombocytosis - Patient was previously seen at the [facility] by Dr. Beryle Beams, was lost to follow-up after September 2019. - Diagnosed with essential thrombocytosis in January 2009.  Presented with neurologic symptoms and MRI showed probable lacunar infarcts.  Elevated platelets were noted at that time, patient was referred to hematologist who performed bone marrow biopsy and started patient on hydroxyurea.   - JAK2 [id number] POSITIVE (04/03/2022) - Currently taking hydroxyurea 1000 mg daily.  Tolerating it well. - Patient takes Coumadin (mechanical aortic  valve) and aspirin 81 mg daily - He has history of heart attack x2.  Probable lacunar infarcts on MRI in 2009, as above.  No history of DVT, PE - No aquagenic pruritus, erythromelalgia, or vasomotor symptoms.  No B symptoms. - Labs today (08/02/2022): Platelets 442.  Hgb 13.9/hematocrit 38.7, WBC 3.8/lymphocytes 0.4. - PLAN: Platelets improved, but not quite at goal (<400).  Will INCREASE Hydrea to take 1000 mg TTSS but 1500 mg on MWF. - Persistence of mild leukopenia/lymphopenia noted.  Will continue to follow this at next visit. - Will repeat CBC/D with office visit in 6 weeks (needs repeat physical exam of rash on forehead)  2.  Rash - Patient reports dry scaly nonpruritic rash on his forehead for the past year - Reports that he has been to 2 dermatologists and that they "were no help at all" - PLAN: Recommend referral to dermatology, patient declines at this time. - If any worsening of rash on increased dose of Hydrea would consider possibility of drug side effect  3.  OTHER HISTORY - PMH: Atrial fibrillation/flutter (chronic anticoagulation with warfarin), mechanical aortic valve (Coumadin), hyperlipidemia, systolic CHF, hypothyroidism, coronary artery disease with heart attacks in 1994 in 2017.  Most recent colonoscopy in 2019.  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  He is a former smoker, smoked 1 PPD x33 years, quit smoking in 2017.  He reports daily alcohol consumption (2 beers +1 glass of wine).  He has prior history of substance abuse with cocaine and benzodiazepine, which led to nonischemic cardiomyopathy and aortic valve failure, therefore quit in 1994.  -  FAMILY: Maternal uncle with colon cancer.  Paternal aunt with colon cancer.  PLAN SUMMARY: >> CBC/D in 6 weeks >> Office visit in 6 weeks     REVIEW OF SYSTEMS:   Review of Systems  Constitutional:  Negative for appetite change, chills, diaphoresis, fatigue, fever and unexpected weight change.  HENT:   Negative for  lump/mass and nosebleeds.   Eyes:  Negative for eye problems.  Respiratory:  Negative for cough, hemoptysis and shortness of breath.   Cardiovascular:  Negative for chest pain, leg swelling and palpitations.  Gastrointestinal:  Negative for abdominal pain, blood in stool, constipation, diarrhea, nausea and vomiting.  Genitourinary:  Negative for hematuria.   Skin:  Positive for rash.  Neurological:  Negative for dizziness, headaches and light-headedness.  Hematological:  Does not bruise/bleed easily.  Psychiatric/Behavioral:  Positive for sleep disturbance.      PHYSICAL EXAM:  ECOG PERFORMANCE STATUS: 0 - Asymptomatic  There were no vitals filed for this visit. There were no vitals filed for this visit. Physical Exam Constitutional:      Appearance: Normal appearance. He is normal weight.  Cardiovascular:     Heart sounds: Normal heart sounds.  Pulmonary:     Breath sounds: Normal breath sounds.  Skin:    Findings: Rash (Scaly rash across forehead with few scabs) present.  Neurological:     General: No focal deficit present.     Mental Status: Mental status is at baseline.  Psychiatric:        Behavior: Behavior normal. Behavior is cooperative.    PAST MEDICAL/SURGICAL HISTORY:  Past Medical History:  Diagnosis Date   Atherosclerotic heart disease of native coronary artery without angina pectoris    Chronic anticoagulation 123XX123   Chronic systolic heart failure ([place]) 02/02/2017   Essential thrombocythemia ([place]) 02/15/2017   Hyperlipidemia    Hypertension    Ischemic cardiomyopathy    Leg pain, central, left 04/17/2017   Hx sciatica - pain similar; no red flag signs on hx or exam    Presence of other heart-valve replacement    Ventricular tachycardia (HCC)    Past Surgical History:  Procedure Laterality Date   BIV ICD INSERTION CRT-D     CARDIOVERSION N/A 10/21/2020   Procedure: CARDIOVERSION;  Surgeon: Lelon Perla, MD;  Location: [facility];  Service:  Cardiovascular;  Laterality: N/A;   CARDIOVERSION N/A 09/22/2021   Procedure: CARDIOVERSION;  Surgeon: Sueanne Margarita, MD;  Location: [place];  Service: Cardiovascular;  Laterality: N/A;   CORONARY ARTERY BYPASS GRAFT  2017   MECHANICAL AORTIC VALVE REPLACEMENT     TEE WITHOUT CARDIOVERSION N/A 10/21/2020   Procedure: TRANSESOPHAGEAL ECHOCARDIOGRAM (TEE);  Surgeon: Lelon Perla, MD;  Location: [facility] ENDOSCOPY;  Service: Cardiovascular;  Laterality: N/A;    SOCIAL HISTORY:  Social History   Socioeconomic History   Marital status: Divorced    Spouse name: Not on file   Number of children: Not on file   Years of education: Not on file   Highest education level: Not on file  Occupational History   Not on file  Tobacco Use   Smoking status: Former    Packs/day: 1.00    Years: 30.00    Total pack years: 30.00    Types: Cigarettes    Start date: 05/26/1979    Quit date: 05/18/2016    Years since quitting: 6.2    Passive exposure: Never   Smokeless tobacco: Never  Vaping Use   Vaping Use: Never used  Substance and Sexual Activity   Alcohol use: Yes    Alcohol/week: 10.0 standard drinks of alcohol    Types: 10 Cans of beer per week    Comment: 1 can a day..   Drug use: No   Sexual activity: Not on file  Other Topics Concern   Not on file  Social History Narrative   Not on file   Social Determinants of Health   Financial Resource Strain: Not on file  Food Insecurity: Food Insecurity Present (04/03/2022)   Hunger Vital Sign    Worried About Running Out of Food in the Last Year: Sometimes true    Ran Out of Food in the Last Year: Sometimes true  Transportation Needs: No Transportation Needs (04/03/2022)   PRAPARE - Hydrologist (Medical): No    Lack of Transportation (Non-Medical): No  Physical Activity: Not on file  Stress: Not on file  Social Connections: Not on file  Intimate Partner Violence: Not At Risk (04/03/2022)   Humiliation,  Afraid, Rape, and Kick questionnaire    Fear of Current or Ex-Partner: No    Emotionally Abused: No    Physically Abused: No    Sexually Abused: No    FAMILY HISTORY:  Family History  Problem Relation Age of Onset   Hypertension Father     CURRENT MEDICATIONS:  Outpatient Encounter Medications as of 08/03/2022  Medication Sig Note   amiodarone (PACERONE) 200 MG tablet To replace Amiodarone 100 mg. Take 1/2 tablet (100 mg) PO in the morning and 1 tablets (200 mg) PO at night.    amoxicillin (AMOXIL) 500 MG capsule Take 4 capsules (2.'000mg'$ ) one hour before dental procedures.    Ascorbic Acid (CVS VITAMIN C PO) Take 1 tablet by mouth daily. 60 mg Vit C and 15 mg Zinc ( combo)    aspirin 81 MG tablet Take 81 mg by mouth daily.    benzonatate (TESSALON) 100 MG capsule Take 100 mg by mouth 3 (three) times daily as needed for cough.    carvedilol (COREG) 3.125 MG tablet TAKE 1 TABLET(3.125 MG) BY MOUTH TWICE DAILY WITH A MEAL    dapagliflozin propanediol (FARXIGA) 10 MG TABS tablet Take 1 tablet (10 mg total) by mouth daily.    furosemide (LASIX) 20 MG tablet Take 1 tablet (20 mg total) by mouth daily.    hydroxyurea (HYDREA) 500 MG capsule Take 2 capsules (1,000 mg total) by mouth daily. May take with food to minimize GI side effects.    ipratropium (ATROVENT) 0.06 % nasal spray Place 2 sprays into both nostrils 3 (three) times daily.    levothyroxine (SYNTHROID) 75 MCG tablet Take 75 mcg by mouth daily before breakfast.    potassium chloride SA (KLOR-CON M) 10 MEQ tablet Take 1 tablet (10 mEq total) by mouth daily.    spironolactone (ALDACTONE) 25 MG tablet TAKE 1/2 TABLET(12.5 MG) BY MOUTH DAILY 06/20/2022: Takes a whole tablet every other day.   traZODone (DESYREL) 50 MG tablet Take by mouth.    vitamin B-12 (CYANOCOBALAMIN) 1000 MCG tablet Take 1,000 mcg by mouth daily.    Vitamin D, Ergocalciferol, (DRISDOL) 1.25 MG (50000 UNIT) CAPS capsule Take 50,000 Units by mouth daily.    warfarin  (COUMADIN) 5 MG tablet Take 0.5-1 tablets (2.5-5 mg total) by mouth See admin instructions. Take 1 tablet daily except 1/2 tablet on Mondays and Fridays    No facility-administered encounter medications on file as of 08/03/2022.    ALLERGIES:  Allergies  Allergen Reactions   Erythromycin Rash   Rosuvastatin Calcium     Muscle cramps   Simvastatin     Muscle cramps   Zithromax [Azithromycin] Rash    LABORATORY DATA:  I have reviewed the labs as listed.  CBC    Component Value Date/Time   WBC 3.8 (L) 06/05/2022 1256   RBC 3.62 (L) 06/05/2022 1256   HGB 14.5 06/05/2022 1256   HGB 12.6 (L) 02/07/2018 1336   HCT 41.9 06/05/2022 1256   HCT 35.0 (L) 02/07/2018 1336   PLT 439 (H) 06/05/2022 1256   PLT 440 02/07/2018 1336   MCV 115.7 (H) 06/05/2022 1256   MCV 108 (H) 02/07/2018 1336   MCH 40.1 (H) 06/05/2022 1256   MCHC 34.6 06/05/2022 1256   RDW 16.0 (H) 06/05/2022 1256   RDW 15.7 (H) 02/07/2018 1336   LYMPHSABS 0.3 (L) 06/05/2022 1256   LYMPHSABS 0.7 02/07/2018 1336   MONOABS 0.3 06/05/2022 1256   EOSABS 0.1 06/05/2022 1256   EOSABS 0.1 02/07/2018 1336   BASOSABS 0.0 06/05/2022 1256   BASOSABS 0.0 02/07/2018 1336      Latest Ref Rng & Units 07/04/2022   12:12 PM 06/05/2022   12:56 PM 05/30/2022    1:55 PM  CMP  Glucose 70 - 99 mg/dL 89  94  81   BUN 8 - 27 mg/dL '24  25  26   '$ Creatinine 0.76 - 1.27 mg/dL 1.44  1.23  1.46   Sodium 134 - 144 mmol/L 136  132  137   Potassium 3.5 - 5.2 mmol/L 5.2  4.3  5.0   Chloride 96 - 106 mmol/L 99  98  101   CO2 20 - 29 mmol/L '22  25  28   '$ Calcium 8.6 - 10.2 mg/dL 9.3  9.0  9.2   Total Protein 6.0 - 8.5 g/dL 7.0  7.3    Total Bilirubin 0.0 - 1.2 mg/dL 0.5  1.1    Alkaline Phos 44 - 121 IU/L 75  61    AST 0 - 40 IU/L 26  26    ALT 0 - 44 IU/L 15  21      DIAGNOSTIC IMAGING:  I have independently reviewed the relevant imaging and discussed with the patient.   WRAP UP:  All questions were answered. The patient knows to call the  clinic with any problems, questions or concerns.  Medical decision making: Moderate  Time spent on visit: I spent 20 minutes counseling the patient face to face. The total time spent in the appointment was 30 minutes and more than 50% was on counseling.  Harriett Rush, PA-C  08/03/22 5:35 PM

## 2022-08-03 ENCOUNTER — Encounter (INDEPENDENT_AMBULATORY_CARE_PROVIDER_SITE_OTHER): Payer: Self-pay

## 2022-08-03 ENCOUNTER — Ambulatory Visit: Payer: Medicare Other

## 2022-08-03 ENCOUNTER — Inpatient Hospital Stay: Payer: Medicare Other | Admitting: Physician Assistant

## 2022-08-03 ENCOUNTER — Inpatient Hospital Stay: Payer: Medicare Other | Attending: Hematology

## 2022-08-03 ENCOUNTER — Other Ambulatory Visit (HOSPITAL_COMMUNITY)
Admission: RE | Admit: 2022-08-03 | Discharge: 2022-08-03 | Disposition: A | Discharge status: Home / Self Care | Payer: Medicare Other | Source: Ambulatory Visit | Attending: Internal Medicine | Admitting: Internal Medicine

## 2022-08-03 DIAGNOSIS — Z5181 Encounter for therapeutic drug level monitoring: Secondary | ICD-10-CM | POA: Insufficient documentation

## 2022-08-03 DIAGNOSIS — I255 Ischemic cardiomyopathy: Secondary | ICD-10-CM

## 2022-08-03 DIAGNOSIS — D75839 Thrombocytosis, unspecified: Secondary | ICD-10-CM | POA: Diagnosis present

## 2022-08-03 DIAGNOSIS — Z952 Presence of prosthetic heart valve: Secondary | ICD-10-CM | POA: Insufficient documentation

## 2022-08-03 DIAGNOSIS — D473 Essential (hemorrhagic) thrombocythemia: Secondary | ICD-10-CM

## 2022-08-03 DIAGNOSIS — Z87891 Personal history of nicotine dependence: Secondary | ICD-10-CM | POA: Insufficient documentation

## 2022-08-03 LAB — LACTATE DEHYDROGENASE: LDH: 285 U/L — ABNORMAL HIGH (ref 98–192)

## 2022-08-03 LAB — COMPREHENSIVE METABOLIC PANEL
ALT: 23 U/L (ref 0–44)
AST: 29 U/L (ref 15–41)
Albumin: 4.1 g/dL (ref 3.5–5.0)
Alkaline Phosphatase: 67 U/L (ref 38–126)
Anion gap: 9 (ref 5–15)
BUN: 28 mg/dL — ABNORMAL HIGH (ref 8–23)
CO2: 24 mmol/L (ref 22–32)
Calcium: 9.2 mg/dL (ref 8.9–10.3)
Chloride: 99 mmol/L (ref 98–111)
Creatinine, Ser: 1.41 mg/dL — ABNORMAL HIGH (ref 0.61–1.24)
GFR, Estimated: 53 mL/min — ABNORMAL LOW (ref >60)
Glucose, Bld: 102 mg/dL — ABNORMAL HIGH (ref 70–99)
Potassium: 4.2 mmol/L (ref 3.5–5.1)
Sodium: 132 mmol/L — ABNORMAL LOW (ref 135–145)
Total Bilirubin: 1 mg/dL (ref 0.3–1.2)
Total Protein: 7.5 g/dL (ref 6.5–8.1)

## 2022-08-03 LAB — CUP PACEART REMOTE DEVICE CHECK
Battery Remaining Longevity: 60 mo
Battery Remaining Percentage: 86 %
Brady Statistic RA Percent Paced: 2 %
Brady Statistic RV Percent Paced: 92 %
Date Time Interrogation Session: 20240307004200
HighPow Impedance: 79 Ohm
Implantable Lead Connection Status: 753985
Implantable Lead Connection Status: 753985
Implantable Lead Connection Status: 753985
Implantable Lead Implant Date: 20180105
Implantable Lead Implant Date: 20180105
Implantable Lead Implant Date: 20180105
Implantable Lead Location: 753858
Implantable Lead Location: 753859
Implantable Lead Location: 753860
Implantable Lead Model: 292
Implantable Lead Model: 4672
Implantable Lead Model: 7741
Implantable Lead Serial Number: 428973
Implantable Lead Serial Number: 801088
Implantable Lead Serial Number: 849519
Implantable Pulse Generator Implant Date: 20180105
Lead Channel Impedance Value: 671 Ohm
Lead Channel Impedance Value: 677 Ohm
Lead Channel Impedance Value: 728 Ohm
Lead Channel Pacing Threshold Amplitude: 0.7 V
Lead Channel Pacing Threshold Amplitude: 0.7 V
Lead Channel Pacing Threshold Amplitude: 1 V
Lead Channel Pacing Threshold Pulse Width: 0.4 ms
Lead Channel Pacing Threshold Pulse Width: 0.4 ms
Lead Channel Pacing Threshold Pulse Width: 1 ms
Lead Channel Setting Pacing Amplitude: 2 V
Lead Channel Setting Pacing Amplitude: 2 V
Lead Channel Setting Pacing Amplitude: 2.2 V
Lead Channel Setting Pacing Pulse Width: 0.4 ms
Lead Channel Setting Pacing Pulse Width: 1 ms
Lead Channel Setting Sensing Sensitivity: 0.6 mV
Lead Channel Setting Sensing Sensitivity: 1 mV
Pulse Gen Serial Number: 180139
Zone Setting Status: 755011

## 2022-08-03 LAB — CBC WITH DIFFERENTIAL/PLATELET
Abs Immature Granulocytes: 0.01 10*3/uL (ref 0.00–0.07)
Basophils Absolute: 0 10*3/uL (ref 0.0–0.1)
Basophils Relative: 1 %
Eosinophils Absolute: 0 10*3/uL (ref 0.0–0.5)
Eosinophils Relative: 1 %
HCT: 38.7 % — ABNORMAL LOW (ref 39.0–52.0)
Hemoglobin: 13.9 g/dL (ref 13.0–17.0)
Immature Granulocytes: 0 %
Lymphocytes Relative: 9 %
Lymphs Abs: 0.4 10*3/uL — ABNORMAL LOW (ref 0.7–4.0)
MCH: 42.9 pg — ABNORMAL HIGH (ref 26.0–34.0)
MCHC: 35.9 g/dL (ref 30.0–36.0)
MCV: 119.4 fL — ABNORMAL HIGH (ref 80.0–100.0)
Monocytes Absolute: 0.3 10*3/uL (ref 0.1–1.0)
Monocytes Relative: 8 %
Neutro Abs: 3.1 10*3/uL (ref 1.7–7.7)
Neutrophils Relative %: 81 %
Platelets: 442 10*3/uL — ABNORMAL HIGH (ref 150–400)
RBC: 3.24 MIL/uL — ABNORMAL LOW (ref 4.22–5.81)
RDW: 15.9 % — ABNORMAL HIGH (ref 11.5–15.5)
WBC: 3.8 10*3/uL — ABNORMAL LOW (ref 4.0–10.5)
nRBC: 0 % (ref 0.0–0.2)

## 2022-08-03 LAB — PROTIME-INR
INR: 3.3 — ABNORMAL HIGH (ref 0.8–1.2)
Prothrombin Time: 33.3 seconds — ABNORMAL HIGH (ref 11.4–15.2)

## 2022-08-03 MED ORDER — HYDROXYUREA 500 MG PO CAPS: ORAL_CAPSULE | ORAL | 2 refills | Status: DC

## 2022-08-03 NOTE — Patient Instructions (Signed)
[facility] [place] **   You were seen today by Dr. Delton Coombes and Tarri Abernethy PA-C for your essential thrombocytosis.   We will INCREASE your Hydrea as follows: Continue to take 1000 mg (2 tablets) daily on Tuesday, Thursday, Saturday, and Sunday Start taking 1500 mg (3 tablets) daily on Monday, Wednesday, and Friday.  FOLLOW-UP APPOINTMENT: Same-day labs and office visit in 6 weeks  ** Thank you for trusting me with your healthcare!  I strive to provide all of my patients with quality care at each visit.  If you receive a survey for this visit, I would be so grateful to you for taking the time to provide feedback.  Thank you in advance!  ~ Kaly Mcquary                   Dr. Derek Jack   &   Tarri Abernethy, PA-C   - - - - - - - - - - - - - - - - - -    Thank you for choosing [place] at [facility] to provide your oncology and hematology care.  To afford each patient quality time with our provider, please arrive at least 15 minutes before your scheduled appointment time.   If you have a lab appointment with the [facility] please come in thru the Main Entrance and check in at the main information desk.  You need to re-schedule your appointment should you arrive 10 or more minutes late.  We strive to give you quality time with our providers, and arriving late affects you and other patients whose appointments are after yours.  Also, if you no show three or more times for appointments you may be dismissed from the clinic at the providers discretion.     Again, thank you for choosing [facility].  Our hope is that these requests will decrease the amount of time that you wait before being seen by our physicians.       _____________________________________________________________  Should you have questions after your visit to [facility], please contact our  office at [phone] and follow the prompts.  Our office hours are 8:00 a.m. and 4:30 p.m. Monday - Friday.  Please note that voicemails left after 4:00 p.m. may not be returned until the following business day.  We are closed weekends and major holidays.  You do have access to a nurse 24-7, just call the main number to the clinic [phone] and do not press any options, hold on the line and a nurse will answer the phone.    For prescription refill requests, have your pharmacy contact our office and allow 72 hours.

## 2022-08-14 DIAGNOSIS — Z5181 Encounter for therapeutic drug level monitoring: Secondary | ICD-10-CM

## 2022-08-16 ENCOUNTER — Ambulatory Visit: Payer: Medicare Other | Attending: Internal Medicine

## 2022-08-16 DIAGNOSIS — I472 Ventricular tachycardia, unspecified: Secondary | ICD-10-CM

## 2022-08-16 DIAGNOSIS — Z79899 Other long term (current) drug therapy: Secondary | ICD-10-CM

## 2022-08-16 DIAGNOSIS — I5022 Chronic systolic (congestive) heart failure: Secondary | ICD-10-CM

## 2022-08-17 LAB — TSH: TSH: 4.13 u[IU]/mL (ref 0.450–4.500)

## 2022-08-17 LAB — T4, FREE: Free T4: 1.78 ng/dL — ABNORMAL HIGH (ref 0.82–1.77)

## 2022-08-23 ENCOUNTER — Ambulatory Visit: Payer: Medicare Other | Attending: Internal Medicine | Admitting: *Deleted

## 2022-08-23 DIAGNOSIS — Z5181 Encounter for therapeutic drug level monitoring: Secondary | ICD-10-CM | POA: Diagnosis not present

## 2022-08-23 DIAGNOSIS — Z952 Presence of prosthetic heart valve: Secondary | ICD-10-CM | POA: Diagnosis not present

## 2022-08-23 LAB — POCT INR: INR: 4.1 — AB (ref 2.0–3.0)

## 2022-08-23 NOTE — Patient Instructions (Signed)
Hold warfarin tonight then decrease dose to 1/2 tablet except 1 tablet on Tuesdays  Back up to Amiodarone 300mg  daily Continue Kale Recheck in 3 wks

## 2022-09-05 ENCOUNTER — Inpatient Hospital Stay: Payer: Medicare Other | Attending: Hematology

## 2022-09-05 DIAGNOSIS — Z7901 Long term (current) use of anticoagulants: Secondary | ICD-10-CM | POA: Diagnosis not present

## 2022-09-05 DIAGNOSIS — D72819 Decreased white blood cell count, unspecified: Secondary | ICD-10-CM | POA: Insufficient documentation

## 2022-09-05 DIAGNOSIS — Z800 Family history of malignant neoplasm of digestive organs: Secondary | ICD-10-CM | POA: Diagnosis not present

## 2022-09-05 DIAGNOSIS — R21 Rash and other nonspecific skin eruption: Secondary | ICD-10-CM | POA: Diagnosis not present

## 2022-09-05 DIAGNOSIS — D473 Essential (hemorrhagic) thrombocythemia: Secondary | ICD-10-CM | POA: Insufficient documentation

## 2022-09-05 DIAGNOSIS — D75839 Thrombocytosis, unspecified: Secondary | ICD-10-CM | POA: Diagnosis present

## 2022-09-05 DIAGNOSIS — Z87891 Personal history of nicotine dependence: Secondary | ICD-10-CM | POA: Insufficient documentation

## 2022-09-05 DIAGNOSIS — I4891 Unspecified atrial fibrillation: Secondary | ICD-10-CM | POA: Diagnosis not present

## 2022-09-05 LAB — CBC WITH DIFFERENTIAL/PLATELET
Abs Immature Granulocytes: 0.02 10*3/uL (ref 0.00–0.07)
Basophils Absolute: 0 10*3/uL (ref 0.0–0.1)
Basophils Relative: 1 %
Eosinophils Absolute: 0 10*3/uL (ref 0.0–0.5)
Eosinophils Relative: 1 %
HCT: 36.9 % — ABNORMAL LOW (ref 39.0–52.0)
Hemoglobin: 13.1 g/dL (ref 13.0–17.0)
Immature Granulocytes: 1 %
Lymphocytes Relative: 12 %
Lymphs Abs: 0.4 10*3/uL — ABNORMAL LOW (ref 0.7–4.0)
MCH: 44.7 pg — ABNORMAL HIGH (ref 26.0–34.0)
MCHC: 35.5 g/dL (ref 30.0–36.0)
MCV: 125.9 fL — ABNORMAL HIGH (ref 80.0–100.0)
Monocytes Absolute: 0.3 10*3/uL (ref 0.1–1.0)
Monocytes Relative: 10 %
Neutro Abs: 2.2 10*3/uL (ref 1.7–7.7)
Neutrophils Relative %: 75 %
Platelets: 350 10*3/uL (ref 150–400)
RBC: 2.93 MIL/uL — ABNORMAL LOW (ref 4.22–5.81)
RDW: 15.6 % — ABNORMAL HIGH (ref 11.5–15.5)
WBC: 2.9 10*3/uL — ABNORMAL LOW (ref 4.0–10.5)
nRBC: 0 % (ref 0.0–0.2)

## 2022-09-05 NOTE — Progress Notes (Signed)
Remote ICD transmission.   

## 2022-09-06 ENCOUNTER — Other Ambulatory Visit: Payer: Medicare Other

## 2022-09-08 ENCOUNTER — Other Ambulatory Visit: Payer: Self-pay

## 2022-09-08 DIAGNOSIS — I714 Abdominal aortic aneurysm, without rupture, unspecified: Secondary | ICD-10-CM

## 2022-09-13 NOTE — Progress Notes (Unsigned)
[facility] [address], [place] [address]   CLINIC:  Medical Oncology/Hematology  PCP:  Renaldo Harrison, DO [address] [place] [address] [phone]   REASON FOR VISIT:  Follow-up for essential thrombocytosis   CURRENT THERAPY: Hydrea 1000 mg TTSS and 1500 mg MWF   INTERVAL HISTORY:   Taylor [name] 72 y.o. male returns for routine follow-up of his essential thrombocytosis.  He was last seen by Rojelio Brenner PA-C on 08/03/2022.  At last visit, Hydrea was increased to 1000 mg TTSS and 1500 mg MWF. *** He is tolerating Hydrea well.  He does note some dry scaly lesions on his forehead that have been coming and going for the past year. *** He reports that they will bleed only if he scratches at them and will heal fairly easily before reappearing again. *** He denies any other skin lesions, rashes, mouth sores, or GI side effects.  ***He reports chronic tinnitus for the past 4-5 years.  He has very rare episodes of transient blurry vision.  He denies any peripheral neuropathy, Raynaud's, or erythromelalgia. *** No fever, chills, night sweats, or unintentional weight loss. Denies any abdominal pain, nausea, early satiety.  He has ***% energy and ***% appetite. He endorses that he is maintaining a stable weight.   ASSESSMENT & PLAN:  1.  Essential thrombocytosis - Patient was previously seen at the [facility] by Dr. Cyndie Chime, was lost to follow-up after September 2019.  Reestablish care in November 2023. - Diagnosed with essential thrombocytosis in January 2009.  Presented with neurologic symptoms and MRI showed probable lacunar infarcts.  Elevated platelets were noted at that time, patient was referred to hematologist who performed bone marrow biopsy and started patient on hydroxyurea.   - JAK2 [id number] POSITIVE (04/03/2022) - Currently taking hydroxyurea 1000 mg TTSS and 1500 mg MWF.  Tolerating it well.*** - Patient takes Coumadin (mechanical  aortic valve) and aspirin 81 mg daily - He has history of heart attack x2.  Probable lacunar infarcts on MRI in 2009, as above.  No history of DVT, PE - No aquagenic pruritus, erythromelalgia, or vasomotor symptoms.  No B symptoms.*** - Most recent labs (07/07/2022): Platelets 350.  Normal Hgb 13.1.  WBC 2.9/ALC 0.4. Mild leukopenia since 06/05/2022 (Hydrea had been increased in November 2023) *** - PLAN: Platelets at goal.  Continue Hydrea 1000 mg TTSS but 1500 mg on MWF.*** - Persistence of mild leukopenia/lymphopenia noted.*** - Will repeat CBC/D with office visit in 6 weeks (needs repeat physical exam of rash on forehead)***  2.  Rash - Patient reports dry scaly nonpruritic rash on his forehead for the past year - Reports that he has been to 2 dermatologists and that they "were no help at all" - PLAN: Recommend referral to dermatology, patient declines at this time. - If any worsening of rash on increased dose of Hydrea would consider possibility of drug side effect  3.  OTHER HISTORY - PMH: Atrial fibrillation/flutter (chronic anticoagulation with warfarin), mechanical aortic valve (Coumadin), hyperlipidemia, systolic CHF, hypothyroidism, coronary artery disease with heart attacks in 1994 in 2017.  Most recent colonoscopy in 2019.  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  He is a former smoker, smoked 1 PPD x33 years, quit smoking in 2017.  He reports daily alcohol consumption (2 beers +1 glass of wine).  He has prior history of substance abuse with cocaine and benzodiazepine, which led to nonischemic cardiomyopathy and aortic valve failure, therefore  quit in 1994.  - FAMILY: Maternal uncle with colon cancer.  Paternal aunt with colon cancer.  PLAN SUMMARY: >> CBC/D in 6 weeks >> Office visit in 6 weeks     REVIEW OF SYSTEMS:   Review of Systems  Constitutional:  Negative for appetite change, chills, diaphoresis, fatigue, fever and unexpected weight change.  HENT:   Negative for  lump/mass and nosebleeds.   Eyes:  Negative for eye problems.  Respiratory:  Negative for cough, hemoptysis and shortness of breath.   Cardiovascular:  Negative for chest pain, leg swelling and palpitations.  Gastrointestinal:  Negative for abdominal pain, blood in stool, constipation, diarrhea, nausea and vomiting.  Genitourinary:  Negative for hematuria.   Skin:  Positive for rash.  Neurological:  Negative for dizziness, headaches and light-headedness.  Hematological:  Does not bruise/bleed easily.  Psychiatric/Behavioral:  Positive for sleep disturbance.      PHYSICAL EXAM:  ECOG PERFORMANCE STATUS: 0 - Asymptomatic  There were no vitals filed for this visit. There were no vitals filed for this visit. Physical Exam Constitutional:      Appearance: Normal appearance. He is normal weight.  Cardiovascular:     Heart sounds: Normal heart sounds.  Pulmonary:     Breath sounds: Normal breath sounds.  Skin:    Findings: Rash (Scaly rash across forehead with few scabs) present.  Neurological:     General: No focal deficit present.     Mental Status: Mental status is at baseline.  Psychiatric:        Behavior: Behavior normal. Behavior is cooperative.    PAST MEDICAL/SURGICAL HISTORY:  Past Medical History:  Diagnosis Date   Atherosclerotic heart disease of native coronary artery without angina pectoris    Chronic anticoagulation 02/18/2018   Chronic systolic heart failure (HCC) 02/02/2017   Essential thrombocythemia (HCC) 02/15/2017   Hyperlipidemia    Hypertension    Ischemic cardiomyopathy    Leg pain, central, left 04/17/2017   Hx sciatica - pain similar; no red flag signs on hx or exam    Presence of other heart-valve replacement    Ventricular tachycardia (HCC)    Past Surgical History:  Procedure Laterality Date   BIV ICD INSERTION CRT-D     CARDIOVERSION N/A 10/21/2020   Procedure: CARDIOVERSION;  Surgeon: Lewayne Bunting, MD;  Location: [facility] ENDOSCOPY;  Service:  Cardiovascular;  Laterality: N/A;   CARDIOVERSION N/A 09/22/2021   Procedure: CARDIOVERSION;  Surgeon: Quintella Reichert, MD;  Location: [facility] ENDOSCOPY;  Service: Cardiovascular;  Laterality: N/A;   CORONARY ARTERY BYPASS GRAFT  2017   MECHANICAL AORTIC VALVE REPLACEMENT     TEE WITHOUT CARDIOVERSION N/A 10/21/2020   Procedure: TRANSESOPHAGEAL ECHOCARDIOGRAM (TEE);  Surgeon: Lewayne Bunting, MD;  Location: [facility] ENDOSCOPY;  Service: Cardiovascular;  Laterality: N/A;    SOCIAL HISTORY:  Social History   Socioeconomic History   Marital status: Divorced    Spouse name: Not on file   Number of children: Not on file   Years of education: Not on file   Highest education level: Not on file  Occupational History   Not on file  Tobacco Use   Smoking status: Former    Packs/day: 1.00    Years: 30.00    Additional pack years: 0.00    Total pack years: 30.00    Types: Cigarettes    Start date: 05/26/1979    Quit date: 05/18/2016    Years since quitting: 6.3    Passive exposure: Never   Smokeless  tobacco: Never  Vaping Use   Vaping Use: Never used  Substance and Sexual Activity   Alcohol use: Yes    Alcohol/week: 10.0 standard drinks of alcohol    Types: 10 Cans of beer per week    Comment: 1 can a day..   Drug use: No   Sexual activity: Not on file  Other Topics Concern   Not on file  Social History Narrative   Not on file   Social Determinants of Health   Financial Resource Strain: Not on file  Food Insecurity: Food Insecurity Present (04/03/2022)   Hunger Vital Sign    Worried About Running Out of Food in the Last Year: Sometimes true    Ran Out of Food in the Last Year: Sometimes true  Transportation Needs: No Transportation Needs (04/03/2022)   PRAPARE - Administrator, Civil Service (Medical): No    Lack of Transportation (Non-Medical): No  Physical Activity: Not on file  Stress: Not on file  Social Connections: Not on file  Intimate Partner Violence: Not At  Risk (04/03/2022)   Humiliation, Afraid, Rape, and Kick questionnaire    Fear of Current or Ex-Partner: No    Emotionally Abused: No    Physically Abused: No    Sexually Abused: No    FAMILY HISTORY:  Family History  Problem Relation Age of Onset   Hypertension Father     CURRENT MEDICATIONS:  Outpatient Encounter Medications as of 09/14/2022  Medication Sig Note   amiodarone (PACERONE) 200 MG tablet To replace Amiodarone 100 mg. Take 1/2 tablet (100 mg) PO in the morning and 1 tablets (200 mg) PO at night.    amoxicillin (AMOXIL) 500 MG capsule Take 4 capsules (2.000mg ) one hour before dental procedures.    Ascorbic Acid ([employer] VITAMIN C PO) Take 1 tablet by mouth daily. 60 mg Vit C and 15 mg Zinc ( combo)    aspirin 81 MG tablet Take 81 mg by mouth daily.    benzonatate (TESSALON) 100 MG capsule Take 100 mg by mouth 3 (three) times daily as needed for cough.    carvedilol (COREG) 3.125 MG tablet TAKE 1 TABLET(3.125 MG) BY MOUTH TWICE DAILY WITH A MEAL    dapagliflozin propanediol (FARXIGA) 10 MG TABS tablet Take 1 tablet (10 mg total) by mouth daily.    furosemide (LASIX) 20 MG tablet Take 1 tablet (20 mg total) by mouth daily.    hydroxyurea (HYDREA) 500 MG capsule Take 2 capsules (1,000 mg total) by mouth every Tuesday, Thursday, Saturday, and Sunday AND 3 capsules (1,500 mg total) every Monday, Wednesday, and Friday. May take with food to minimize GI side effects.Marland Kitchen    ipratropium (ATROVENT) 0.06 % nasal spray Place 2 sprays into both nostrils 3 (three) times daily.    levothyroxine (SYNTHROID) 75 MCG tablet Take 75 mcg by mouth daily before breakfast.    potassium chloride SA (KLOR-CON M) 10 MEQ tablet Take 1 tablet (10 mEq total) by mouth daily.    spironolactone (ALDACTONE) 25 MG tablet TAKE 1/2 TABLET(12.5 MG) BY MOUTH DAILY 06/20/2022: Takes a whole tablet every other day.   traZODone (DESYREL) 50 MG tablet Take by mouth.    vitamin B-12 (CYANOCOBALAMIN) 1000 MCG tablet Take 1,000  mcg by mouth daily.    Vitamin D, Ergocalciferol, (DRISDOL) 1.25 MG (50000 UNIT) CAPS capsule Take 50,000 Units by mouth daily.    warfarin (COUMADIN) 5 MG tablet Take 0.5-1 tablets (2.5-5 mg total) by mouth See admin instructions.  Take 1 tablet daily except 1/2 tablet on Mondays and Fridays    No facility-administered encounter medications on file as of 09/14/2022.    ALLERGIES:  Allergies  Allergen Reactions   Erythromycin Rash   Rosuvastatin Calcium     Muscle cramps   Simvastatin     Muscle cramps   Zithromax [Azithromycin] Rash    LABORATORY DATA:  I have reviewed the labs as listed.  CBC    Component Value Date/Time   WBC 2.9 (L) 09/05/2022 1519   RBC 2.93 (L) 09/05/2022 1519   HGB 13.1 09/05/2022 1519   HGB 12.6 (L) 02/07/2018 1336   HCT 36.9 (L) 09/05/2022 1519   HCT 35.0 (L) 02/07/2018 1336   PLT 350 09/05/2022 1519   PLT 440 02/07/2018 1336   MCV 125.9 (H) 09/05/2022 1519   MCV 108 (H) 02/07/2018 1336   MCH 44.7 (H) 09/05/2022 1519   MCHC 35.5 09/05/2022 1519   RDW 15.6 (H) 09/05/2022 1519   RDW 15.7 (H) 02/07/2018 1336   LYMPHSABS 0.4 (L) 09/05/2022 1519   LYMPHSABS 0.7 02/07/2018 1336   MONOABS 0.3 09/05/2022 1519   EOSABS 0.0 09/05/2022 1519   EOSABS 0.1 02/07/2018 1336   BASOSABS 0.0 09/05/2022 1519   BASOSABS 0.0 02/07/2018 1336      Latest Ref Rng & Units 08/03/2022   12:42 PM 07/04/2022   12:12 PM 06/05/2022   12:56 PM  CMP  Glucose 70 - 99 mg/dL 161  89  94   BUN 8 - 23 mg/dL 28  24  25    Creatinine 0.61 - 1.24 mg/dL 0.96  0.45  4.09   Sodium 135 - 145 mmol/L 132  136  132   Potassium 3.5 - 5.1 mmol/L 4.2  5.2  4.3   Chloride 98 - 111 mmol/L 99  99  98   CO2 22 - 32 mmol/L 24  22  25    Calcium 8.9 - 10.3 mg/dL 9.2  9.3  9.0   Total Protein 6.5 - 8.1 g/dL 7.5  7.0  7.3   Total Bilirubin 0.3 - 1.2 mg/dL 1.0  0.5  1.1   Alkaline Phos 38 - 126 U/L 67  75  61   AST 15 - 41 U/L 29  26  26    ALT 0 - 44 U/L 23  15  21      DIAGNOSTIC IMAGING:  I  have independently reviewed the relevant imaging and discussed with the patient.   WRAP UP:  All questions were answered. The patient knows to call the clinic with any problems, questions or concerns.  Medical decision making: Moderate  Time spent on visit: I spent 20 minutes counseling the patient face to face. The total time spent in the appointment was 30 minutes and more than 50% was on counseling.  Carnella Guadalajara, PA-C  09/13/22 8:40 PM

## 2022-09-14 ENCOUNTER — Inpatient Hospital Stay: Payer: Medicare Other | Admitting: Physician Assistant

## 2022-09-14 ENCOUNTER — Other Ambulatory Visit: Payer: Self-pay

## 2022-09-14 VITALS — BP 132/70 | HR 64 | Temp 97.8°F | Resp 16 | Wt 154.8 lb

## 2022-09-14 DIAGNOSIS — Z1589 Genetic susceptibility to other disease: Secondary | ICD-10-CM | POA: Diagnosis not present

## 2022-09-14 DIAGNOSIS — D473 Essential (hemorrhagic) thrombocythemia: Secondary | ICD-10-CM

## 2022-09-14 NOTE — Patient Instructions (Signed)
[facility] at [facility] **VISIT SUMMARY & IMPORTANT INSTRUCTIONS **   You were seen today Taylor Brenner PA-C for your essential thrombocytosis.   Continue the same dose of Hydrea as follows: Take 1000 mg (2 tablets) daily on Tuesday, Thursday, Saturday, and Sunday Take 1500 mg (3 tablets) daily on Monday, Wednesday, and Friday. Your white blood cells are low.  This is most likely a side effect of your Hydrea, but we will check additional labs at your follow-up visit to make sure you do not have another reason for low white blood cells. We will also check an ultrasound of your liver and spleen.  FOLLOW-UP APPOINTMENT: Labs in 3 months with office visit 1 week after  ** Thank you for trusting me with your healthcare!  I strive to provide all of my patients with quality care at each visit.  If you receive a survey for this visit, I would be so grateful to you for taking the time to provide feedback.  Thank you in advance!  ~ Bianco Cange                   Dr. Doreatha Massed   &   Taylor Brenner, PA-C   - - - - - - - - - - - - - - - - - -    Thank you for choosing [facility] at [facility] to provide your oncology and hematology care.  To afford each patient quality time with our provider, please arrive at least 15 minutes before your scheduled appointment time.   If you have a lab appointment with the Cancer Center please come in thru the Main Entrance and check in at the main information desk.  You need to re-schedule your appointment should you arrive 10 or more minutes late.  We strive to give you quality time with our providers, and arriving late affects you and other patients whose appointments are after yours.  Also, if you no show three or more times for appointments you may be dismissed from the clinic at the providers discretion.     Again, thank you for choosing [facility].  Our hope is that these requests will  decrease the amount of time that you wait before being seen by our physicians.       _____________________________________________________________  Should you have questions after your visit to [facility], please contact our office at [phone] and follow the prompts.  Our office hours are 8:00 a.m. and 4:30 p.m. Monday - Friday.  Please note that voicemails left after 4:00 p.m. may not be returned until the following business day.  We are closed weekends and major holidays.  You do have access to a nurse 24-7, just call the main number to the clinic [phone] and do not press any options, hold on the line and a nurse will answer the phone.    For prescription refill requests, have your pharmacy contact our office and allow 72 hours.

## 2022-10-04 ENCOUNTER — Ambulatory Visit (HOSPITAL_COMMUNITY)
Admission: RE | Admit: 2022-10-04 | Discharge: 2022-10-04 | Disposition: A | Discharge status: Home / Self Care | Payer: Medicare Other | Source: Ambulatory Visit | Attending: Surgery | Admitting: Surgery

## 2022-10-04 DIAGNOSIS — I714 Abdominal aortic aneurysm, without rupture, unspecified: Secondary | ICD-10-CM

## 2022-10-04 MED ORDER — IOHEXOL 350 MG/ML SOLN
75.0000 mL | Freq: Once | INTRAVENOUS | Status: AC | PRN
  Administered 2022-10-04: 75 mL via INTRAVENOUS

## 2022-10-05 ENCOUNTER — Ambulatory Visit: Payer: Medicare Other | Attending: Internal Medicine | Admitting: *Deleted

## 2022-10-05 DIAGNOSIS — Z5181 Encounter for therapeutic drug level monitoring: Secondary | ICD-10-CM | POA: Diagnosis not present

## 2022-10-05 DIAGNOSIS — Z952 Presence of prosthetic heart valve: Secondary | ICD-10-CM

## 2022-10-05 LAB — POCT INR: INR: 1.8 — AB (ref 2.0–3.0)

## 2022-10-05 NOTE — Patient Instructions (Signed)
Take warfarin 1 tablet tonight then resume 1/2 tablet except 1 tablet on Tuesdays  Back up to Amiodarone 300mg  daily Decrease Kale Recheck in 3 wks

## 2022-10-09 ENCOUNTER — Ambulatory Visit (HOSPITAL_COMMUNITY)
Admission: RE | Admit: 2022-10-09 | Discharge: 2022-10-09 | Disposition: A | Discharge status: Home / Self Care | Payer: Medicare Other | Source: Ambulatory Visit | Attending: Surgery | Admitting: Surgery

## 2022-10-09 ENCOUNTER — Ambulatory Visit (INDEPENDENT_AMBULATORY_CARE_PROVIDER_SITE_OTHER)
Admission: RE | Admit: 2022-10-09 | Discharge: 2022-10-09 | Disposition: A | Discharge status: Home / Self Care | Payer: Medicare Other | Source: Ambulatory Visit | Attending: Surgery | Admitting: Surgery

## 2022-10-09 ENCOUNTER — Telehealth: Payer: Self-pay | Admitting: *Deleted

## 2022-10-09 ENCOUNTER — Encounter: Payer: Self-pay | Admitting: Surgery

## 2022-10-09 ENCOUNTER — Other Ambulatory Visit (HOSPITAL_COMMUNITY): Payer: Self-pay | Admitting: Surgery

## 2022-10-09 ENCOUNTER — Ambulatory Visit: Payer: Medicare Other | Admitting: Surgery

## 2022-10-09 VITALS — BP 135/74 | HR 58 | Temp 98.1°F | Resp 20 | Ht 69.0 in | Wt 154.0 lb

## 2022-10-09 DIAGNOSIS — I6523 Occlusion and stenosis of bilateral carotid arteries: Secondary | ICD-10-CM | POA: Insufficient documentation

## 2022-10-09 DIAGNOSIS — I7143 Infrarenal abdominal aortic aneurysm, without rupture: Secondary | ICD-10-CM | POA: Diagnosis not present

## 2022-10-09 DIAGNOSIS — I739 Peripheral vascular disease, unspecified: Secondary | ICD-10-CM | POA: Insufficient documentation

## 2022-10-09 LAB — VAS US ABI WITH/WO TBI

## 2022-10-09 NOTE — Progress Notes (Signed)
Vascular and Vein Specialist of [facility]  Patient name: Taylor [name] MRN: [id number] DOB: [birth year] Sex: male   REASON FOR VISIT:    Follow up  HISOTRY OF PRESENT ILLNESS:    Jhordan Nachreiner is a 72 y.o. male who is a former patient of Dr. Darrick Penna who has been followed for abdominal aortic aneurysm.  Recently by ultrasound it had grown to larger than 5 cm and so he was sent for CT scan.  He denies any abdominal pain or back pain.  The patient is on aspirin and Coumadin for coronary issues, including aortic valve repair.  He has hypercholesterolemia but does not tolerate statins.  He has left-sided AICD.  He sees Dr. Gala Romney for his ischemic cardiomyopathy he is medically managed for hypertension.  He is seeing hematology for essential thrombocytosis   PAST MEDICAL HISTORY:   Past Medical History:  Diagnosis Date   Atherosclerotic heart disease of native coronary artery without angina pectoris    Chronic anticoagulation 02/18/2018   Chronic systolic heart failure (HCC) 02/02/2017   Essential thrombocythemia (HCC) 02/15/2017   Hyperlipidemia    Hypertension    Ischemic cardiomyopathy    Leg pain, central, left 04/17/2017   Hx sciatica - pain similar; no red flag signs on hx or exam    Presence of other heart-valve replacement    Ventricular tachycardia (HCC)      FAMILY HISTORY:   Family History  Problem Relation Age of Onset   Hypertension Father     SOCIAL HISTORY:   Social History   Tobacco Use   Smoking status: Former    Packs/day: 1.00    Years: 30.00    Additional pack years: 0.00    Total pack years: 30.00    Types: Cigarettes    Start date: 05/26/1979    Quit date: 05/18/2016    Years since quitting: 6.3    Passive exposure: Never   Smokeless tobacco: Never  Substance Use Topics   Alcohol use: Yes    Alcohol/week: 10.0 standard drinks of alcohol    Types: 10 Cans of beer per week    Comment: 1 can a day..      ALLERGIES:   Allergies  Allergen Reactions   Erythromycin Rash   Rosuvastatin Calcium     Muscle cramps   Simvastatin     Muscle cramps   Zithromax [Azithromycin] Rash     CURRENT MEDICATIONS:   Current Outpatient Medications  Medication Sig Dispense Refill   amiodarone (PACERONE) 200 MG tablet To replace Amiodarone 100 mg. Take 1/2 tablet (100 mg) PO in the morning and 1 tablets (200 mg) PO at night. 135 tablet 3   amoxicillin (AMOXIL) 500 MG capsule Take 4 capsules (2.000mg ) one hour before dental procedures. 4 capsule 0   Ascorbic Acid (CVS VITAMIN C PO) Take 1 tablet by mouth daily. 60 mg Vit C and 15 mg Zinc ( combo)     aspirin 81 MG tablet Take 81 mg by mouth daily.     benzonatate (TESSALON) 100 MG capsule Take 100 mg by mouth 3 (three) times daily as needed for cough.     carvedilol (COREG) 3.125 MG tablet TAKE 1 TABLET(3.125 MG) BY MOUTH TWICE DAILY WITH A MEAL 180 tablet 3   dapagliflozin propanediol (FARXIGA) 10 MG TABS tablet Take 1 tablet (10 mg total) by mouth daily. 90 tablet 3   furosemide (LASIX) 20 MG tablet Take 1 tablet (20 mg total) by mouth daily. 30 tablet 6  hydroxyurea (HYDREA) 500 MG capsule Take 2 capsules (1,000 mg total) by mouth every Tuesday, Thursday, Saturday, and Sunday AND 3 capsules (1,500 mg total) every Monday, Wednesday, and Friday. May take with food to minimize GI side effects.. 75 capsule 2   ipratropium (ATROVENT) 0.06 % nasal spray Place 2 sprays into both nostrils 3 (three) times daily.     levothyroxine (SYNTHROID) 75 MCG tablet Take 75 mcg by mouth daily before breakfast.     potassium chloride SA (KLOR-CON M) 10 MEQ tablet Take 1 tablet (10 mEq total) by mouth daily. 30 tablet 6   spironolactone (ALDACTONE) 25 MG tablet TAKE 1/2 TABLET(12.5 MG) BY MOUTH DAILY 45 tablet 1   traZODone (DESYREL) 50 MG tablet Take by mouth.     vitamin B-12 (CYANOCOBALAMIN) 1000 MCG tablet Take 1,000 mcg by mouth daily.     Vitamin D,  Ergocalciferol, (DRISDOL) 1.25 MG (50000 UNIT) CAPS capsule Take 50,000 Units by mouth daily.     warfarin (COUMADIN) 5 MG tablet Take 0.5-1 tablets (2.5-5 mg total) by mouth See admin instructions. Take 1 tablet daily except 1/2 tablet on Mondays and Fridays 90 tablet 1   No current facility-administered medications for this visit.    REVIEW OF SYSTEMS:   [X]  denotes positive finding, [ ]  denotes negative finding Cardiac  Comments:  Chest pain or chest pressure:    Shortness of breath upon exertion:    Short of breath when lying flat:    Irregular heart rhythm:        Vascular    Pain in calf, thigh, or hip brought on by ambulation:    Pain in feet at night that wakes you up from your sleep:     Blood clot in your veins:    Leg swelling:         Pulmonary    Oxygen at home:    Productive cough:     Wheezing:         Neurologic    Sudden weakness in arms or legs:     Sudden numbness in arms or legs:     Sudden onset of difficulty speaking or slurred speech:    Temporary loss of vision in one eye:     Problems with dizziness:         Gastrointestinal    Blood in stool:     Vomited blood:         Genitourinary    Burning when urinating:     Blood in urine:        Psychiatric    Major depression:         Hematologic    Bleeding problems:    Problems with blood clotting too easily:        Skin    Rashes or ulcers:        Constitutional    Fever or chills:      PHYSICAL EXAM:   There were no vitals filed for this visit.  GENERAL: The patient is a well-nourished male, in no acute distress. The vital signs are documented above. CARDIAC: There is a regular rate and rhythm.  VASCULAR: Nonpalpable pedal pulses PULMONARY: Non-labored respirations ABDOMEN: Soft and non-tender  MUSCULOSKELETAL: There are no major deformities or cyanosis. NEUROLOGIC: No focal weakness or paresthesias are detected. SKIN: There are no ulcers or rashes noted. PSYCHIATRIC: The patient  has a normal affect.  STUDIES:   I have reviewed the following CTA: VASCULAR IMPRESSION:   1. Interval  increase in size of known infrarenal abdominal aortic aneurysm now measuring 5.1 cm, previously, 4.5 cm. No evidence of abdominal aortic dissection or perivascular stranding. If intervention is not pursued, recommend follow-up CTA of the abdomen and pelvis in 3-6 months as indicated. This recommendation follows ACR consensus guidelines: White Paper of the ACR Incidental Findings Committee II on Vascular Findings. J Am Coll Radiol 2013; 10:789-794. 2. 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 The IMA is diseased though remains patent with early collateral supply from the SMA. 3. Aneurysmal dilatation of the distal aspects of the bilateral common iliac arteries, the right measuring 1.8 cm and the left measuring 1.9 cm. 4. 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 5. Tandem areas of at least 50% luminal narrowing involving the bilateral pelvic arterial inflow and proximal outflow, suboptimally evaluated due to extensive mural calcifications.   NONVASCULAR IMPRESSION:   1. No acute findings within the abdomen or pelvis. 2. Stable presumed postoperative change in scarring involving the inferior pole of the left kidney.  MEDICAL ISSUES:   AAA:   The patient's aneurysm has enlarged to 5.1 cm.  We discussed proceeding with endovascular repair.  I discussed the risks and benefits of the operation including the risk of stroke, death, intestinal ischemia, renal insufficiency, lower extremity vascular issues.  He wishes to proceed.  Because of the amount of disease within his external iliac and common femoral arteries, he may require open femoral artery exposure and possible external iliac artery stenting.  This should also help with his claudication symptoms.  He wants to  get this done as soon as possible.   I will need to get clearance from his cardiology team before proceeding.  He is on Coumadin for his aortic valve.  This will need to be discontinued for surgery.  I would like to ask cardiology's assistance in appropriate management of this  I am getting baseline ABI and carotid duplex studies today.    Charlena Cross, MD, FACS [facility] Specialists of [facility] [phone] Pager [phone]

## 2022-10-09 NOTE — Telephone Encounter (Signed)
   Pre-operative Risk Assessment    Patient Name: Taylor [name]  DOB: Apr 14, 1951 MRN: [id number]      Request for Surgical Clearance    Procedure:   EVAR  Date of Surgery:  Clearance TBD                                 Surgeon:  DR. Myra Gianotti Surgeon's Group or Practice Name:  [facility] Phone number:  [phone] Fax number:  [phone]   Type of Clearance Requested:   - Medical  - Pharmacy:  Hold Aspirin and Warfarin (Coumadin)     Type of Anesthesia:  General    Additional requests/questions:    Elpidio Anis   10/09/2022, 3:59 PM

## 2022-10-10 NOTE — Telephone Encounter (Signed)
Left message to call back to set up tele pre op appt.  

## 2022-10-10 NOTE — Telephone Encounter (Signed)
Patient with diagnosis of mechanical aortic valve replacement and afib on warfarin for anticoagulation.    Procedure: EVAR Date of procedure: TBD  CrCl 46 ml/min Platelet count 350  Per office protocol, patient can hold warfarin for 5 days prior to procedure.   Patient WILL need bridging with Lovenox (enoxaparin) around procedure.  **This guidance is not considered finalized until pre-operative APP has relayed final recommendations.**  Patient current INR goal is 2-3, however per guidelines, pt with should have an INR goal of 2.5-3.5 when mechanical aortic valve and a risk factor (afib) are present. Patient had post opt afib in 2017 and had an aflutter ablation in 2023 at [facility]. I will sent to Dr. Elberta Fortis to discuss goal change.   Pt is seen in coumadin clinic in [place]. Will send to [employer] as an [place].

## 2022-10-10 NOTE — Telephone Encounter (Signed)
   Name: Taylor [name]  DOB: 05-15-1951  MRN: [id number]  Primary Cardiologist: None  Chart reviewed as part of pre-operative protocol coverage. Because of [name] past medical history and time since last visit, he will require a follow-up telephone visit in order to better assess preoperative cardiovascular risk.  Pre-op covering staff: - Please schedule appointment and call patient to inform them. If patient already had an upcoming appointment within acceptable timeframe, please add "pre-op clearance" to the appointment notes so provider is aware. - Please contact requesting surgeon's office via preferred method (i.e, phone, fax) to inform them of need for appointment prior to surgery.  Per office protocol, patient can hold warfarin for 5 days prior to procedure.   Patient WILL need bridging with Lovenox (enoxaparin) around procedure.  Sharlene Dory, PA-C  10/10/2022, 3:49 PM

## 2022-10-11 ENCOUNTER — Telehealth: Payer: Self-pay | Admitting: *Deleted

## 2022-10-11 ENCOUNTER — Encounter (INDEPENDENT_AMBULATORY_CARE_PROVIDER_SITE_OTHER): Payer: Self-pay

## 2022-10-11 NOTE — Telephone Encounter (Signed)
S/w pt scheduled telephone clearance.  Will send to device to clear pt.    Patient Consent for Virtual Visit         [name] has provided verbal consent on 10/11/2022 for a virtual visit (video or telephone).   CONSENT FOR VIRTUAL VISIT FOR:  Taylor [name]  By participating in this virtual visit I agree to the following:  I hereby voluntarily request, consent and authorize [facility] and its employed or contracted physicians, physician assistants, nurse practitioners or other licensed health care professionals (the Practitioner), to provide me with telemedicine health care services (the "Services") as deemed necessary by the treating Practitioner. I acknowledge and consent to receive the Services by the Practitioner via telemedicine. I understand that the telemedicine visit will involve communicating with the Practitioner through live audiovisual communication technology and the disclosure of certain medical information by electronic transmission. I acknowledge that I have been given the opportunity to request an in-person assessment or other available alternative prior to the telemedicine visit and am voluntarily participating in the telemedicine visit.  I understand that I have the right to withhold or withdraw my consent to the use of telemedicine in the course of my care at any time, without affecting my right to future care or treatment, and that the Practitioner or I may terminate the telemedicine visit at any time. I understand that I have the right to inspect all information obtained and/or recorded in the course of the telemedicine visit and may receive copies of available information for a reasonable fee.  I understand that some of the potential risks of receiving the Services via telemedicine include:  Delay or interruption in medical evaluation due to technological equipment failure or disruption; Information transmitted may not be sufficient (e.g. poor resolution of  images) to allow for appropriate medical decision making by the Practitioner; and/or  In rare instances, security protocols could fail, causing a breach of personal health information.  Furthermore, I acknowledge that it is my responsibility to provide information about my medical history, conditions and care that is complete and accurate to the best of my ability. I acknowledge that Practitioner's advice, recommendations, and/or decision may be based on factors not within their control, such as incomplete or inaccurate data provided by me or distortions of diagnostic images or specimens that may result from electronic transmissions. I understand that the practice of medicine is not an exact science and that Practitioner makes no warranties or guarantees regarding treatment outcomes. I acknowledge that a copy of this consent can be made available to me via my patient portal [facility] MyChart), or I can request a printed copy by calling the office of [facility].    I understand that my insurance will be billed for this visit.   I have read or had this consent read to me. I understand the contents of this consent, which adequately explains the benefits and risks of the Services being provided via telemedicine.  I have been provided ample opportunity to ask questions regarding this consent and the Services and have had my questions answered to my satisfaction. I give my informed consent for the services to be provided through the use of telemedicine in my medical care

## 2022-10-12 ENCOUNTER — Encounter: Payer: Self-pay | Admitting: Cardiology

## 2022-10-12 NOTE — Telephone Encounter (Signed)
I think this was sent to [place] by mistake.   Thanks, Marliss Czar

## 2022-10-12 NOTE — Progress Notes (Signed)
PERIOPERATIVE PRESCRIPTION FOR IMPLANTED CARDIAC DEVICE PROGRAMMING   Patient Information:  Patient: Taylor [name]  MRN: [id number]  Date of Birth: 1950-10-14      Planned Procedure:  EVAR  Surgeon:  DrMarland Kitchen Myra Gianotti Date of Procedure:  TBD    Device Information:   Clinic EP Physician:   Dr. Loman Brooklyn Device Type:  Defibrillator Manufacturer and Phone #:  [employer]: [phone] Pacemaker Dependent?:  Unknown Date of Last Device Check:  08/03/2022        Normal Device Function?:  Yes     Electrophysiologist's Recommendations:   Have magnet available. Provide continuous ECG monitoring when magnet is used or reprogramming is to be performed.  Procedure may interfere with device function.  Magnet should be placed over device during procedure.  Per Device Clinic Standing Orders, Lenor Coffin  10/12/2022 10:30 AM

## 2022-10-17 ENCOUNTER — Other Ambulatory Visit (HOSPITAL_COMMUNITY): Payer: Self-pay

## 2022-10-17 DIAGNOSIS — I5022 Chronic systolic (congestive) heart failure: Secondary | ICD-10-CM

## 2022-10-17 MED ORDER — DAPAGLIFLOZIN PROPANEDIOL 10 MG PO TABS: 10.0000 mg | ORAL_TABLET | Freq: Every day | ORAL | 3 refills | Status: DC

## 2022-10-18 ENCOUNTER — Ambulatory Visit: Payer: Medicare Other | Attending: Cardiovascular Disease | Admitting: Nurse Practitioner

## 2022-10-18 DIAGNOSIS — Z01810 Encounter for preprocedural cardiovascular examination: Secondary | ICD-10-CM

## 2022-10-18 NOTE — Progress Notes (Signed)
Virtual Visit via Telephone Note   Because of Chon Standard co-morbid illnesses, he is at least at moderate risk for complications without adequate follow up.  This format is felt to be most appropriate for this patient at this time.  The patient did not have access to video technology/had technical difficulties with video requiring transitioning to audio format only (telephone).  All issues noted in this document were discussed and addressed.  No physical exam could be performed with this format.  Please refer to the patient's chart for his consent to telehealth for [facility].  Evaluation Performed:  Preoperative cardiovascular risk assessment _____________   Date:  10/18/2022   Patient ID:  Taylor [name], DOB 11-21-1950, MRN [id number] Patient Location:  Home Provider location:   Office  Primary Care Provider:  Renaldo Harrison, DO Primary Cardiologist:  None  Chief Complaint / Patient Profile   72 y.o. y/o male with a h/o CAD s/p CABG, ICM, chronic systolic heart failure, valvular heart disease s/p mechanical aortic/mitral valve replacement, V-fib arrest s/p ICD, paroxysmal atrial fibrillation/flutter s/p ablation, hypertension, hyperlipidemia, AAA and essential thrombocytopenia, who is pending EVAR with Dr. Myra Gianotti of [facility] and presents today for telephonic preoperative cardiovascular risk assessment.  History of Present Illness    Taylor [name] is a 72 y.o. male who presents via audio/video conferencing for a telehealth visit today.  Pt was last seen in cardiology clinic on 07/04/2022 by Alejandro Mulling, PA.  At that time [facility] was doing well.  The patient is now pending procedure as outlined above. Since his last visit, he has done well from a cardiac standpoint.   He denies chest pain, palpitations, dyspnea, pnd, orthopnea, n, v, dizziness, syncope, edema, weight gain, or early satiety. All other systems reviewed and are otherwise negative except as noted above.   Past  Medical History    Past Medical History:  Diagnosis Date   Atherosclerotic heart disease of native coronary artery without angina pectoris    Chronic anticoagulation 02/18/2018   Chronic systolic heart failure (HCC) 02/02/2017   Essential thrombocythemia (HCC) 02/15/2017   Hyperlipidemia    Hypertension    Ischemic cardiomyopathy    Leg pain, central, left 04/17/2017   Hx sciatica - pain similar; no red flag signs on hx or exam    Presence of other heart-valve replacement    Ventricular tachycardia (HCC)    Past Surgical History:  Procedure Laterality Date   BIV ICD INSERTION CRT-D     CARDIOVERSION N/A 10/21/2020   Procedure: CARDIOVERSION;  Surgeon: Lewayne Bunting, MD;  Location: [facility] ENDOSCOPY;  Service: Cardiovascular;  Laterality: N/A;   CARDIOVERSION N/A 09/22/2021   Procedure: CARDIOVERSION;  Surgeon: Quintella Reichert, MD;  Location: [facility] ENDOSCOPY;  Service: Cardiovascular;  Laterality: N/A;   CORONARY ARTERY BYPASS GRAFT  2017   MECHANICAL AORTIC VALVE REPLACEMENT     TEE WITHOUT CARDIOVERSION N/A 10/21/2020   Procedure: TRANSESOPHAGEAL ECHOCARDIOGRAM (TEE);  Surgeon: Lewayne Bunting, MD;  Location: [facility] ENDOSCOPY;  Service: Cardiovascular;  Laterality: N/A;    Allergies  Allergies  Allergen Reactions   Erythromycin Rash   Rosuvastatin Calcium     Muscle cramps   Simvastatin     Muscle cramps   Zithromax [Azithromycin] Rash    Home Medications    Prior to Admission medications   Medication Sig Start Date End Date Taking? Authorizing Provider  amiodarone (PACERONE) 200 MG tablet To replace Amiodarone 100 mg. Take 1/2 tablet (100 mg) PO in the  morning and 1 tablets (200 mg) PO at night. 07/17/22   Duke Salvia, MD  amoxicillin (AMOXIL) 500 MG capsule Take 4 capsules (2.000mg ) one hour before dental procedures. 08/17/21   Bensimhon, Bevelyn Buckles, MD  Ascorbic Acid (CVS VITAMIN C PO) Take 1 tablet by mouth daily. 60 mg Vit C and 15 mg Zinc ( combo)    [provider]   aspirin 81 MG tablet Take 81 mg by mouth daily.    [provider]  benzonatate (TESSALON) 100 MG capsule Take 100 mg by mouth 3 (three) times daily as needed for cough. 09/09/21   [provider]  carvedilol (COREG) 3.125 MG tablet TAKE 1 TABLET(3.125 MG) BY MOUTH TWICE DAILY WITH A MEAL 08/18/20   Bensimhon, Bevelyn Buckles, MD  dapagliflozin propanediol (FARXIGA) 10 MG TABS tablet Take 1 tablet (10 mg total) by mouth daily. 10/17/22   Bensimhon, Bevelyn Buckles, MD  furosemide (LASIX) 20 MG tablet Take 1 tablet (20 mg total) by mouth daily. 05/15/22 05/15/23  Jacklynn Ganong, FNP  hydroxyurea (HYDREA) 500 MG capsule Take 2 capsules (1,000 mg total) by mouth every Tuesday, Thursday, Saturday, and Sunday AND 3 capsules (1,500 mg total) every Monday, Wednesday, and Friday. May take with food to minimize GI side effects.. 08/03/22   Carnella Guadalajara, PA-C  levothyroxine (SYNTHROID) 75 MCG tablet Take 75 mcg by mouth daily before breakfast. 06/21/20   [provider]  potassium chloride SA (KLOR-CON M) 10 MEQ tablet Take 1 tablet (10 mEq total) by mouth daily. 05/15/22   Jacklynn Ganong, FNP  spironolactone (ALDACTONE) 25 MG tablet TAKE 1/2 TABLET(12.5 MG) BY MOUTH DAILY 03/15/22   Laurey Morale, MD  traZODone (DESYREL) 50 MG tablet Take by mouth. 02/15/22   [provider]  vitamin B-12 (CYANOCOBALAMIN) 1000 MCG tablet Take 1,000 mcg by mouth daily.    [provider]  Vitamin D, Ergocalciferol, (DRISDOL) 1.25 MG (50000 UNIT) CAPS capsule Take 50,000 Units by mouth daily.    [provider]  warfarin (COUMADIN) 5 MG tablet Take 0.5-1 tablets (2.5-5 mg total) by mouth See admin instructions. Take 1 tablet daily except 1/2 tablet on Mondays and Fridays 04/14/22   Sheilah Pigeon, PA-C    Physical Exam    Vital Signs:  Taylor [name] does not have vital signs available for review today.  Given telephonic nature of communication, physical exam is  limited. AAOx3. NAD. Normal affect.  Speech and respirations are unlabored.  Accessory Clinical Findings    None  Assessment & Plan    1.  Preoperative Cardiovascular Risk Assessment:  According to the Revised Cardiac Risk Index (RCRI), his Perioperative Risk of Major Cardiac Event is (%): 11. His Functional Capacity in METs is: 5.81 according to the Duke Activity Status Index (DASI).Therefore, based on ACC/AHA guidelines, patient would be at acceptable risk for the planned procedure without further cardiovascular testing.  The patient was advised that if he develops new symptoms prior to surgery to contact our office to arrange for a follow-up visit, and he verbalized understanding.  Per office protocol, patient can hold warfarin for 5 days prior to procedure.   Patient WILL need bridging with Lovenox (enoxaparin) around procedure.  Patient was advised that he will need to contact the Coumadin clinic to coordinate Lovenox bridge once surgery date is planned.  Please resume warfarin as soon as possible postprocedure, the discretion of the surgeon.    Regarding ASA therapy, we recommend continuation of ASA  throughout the perioperative period.  However, if the surgeon feels that cessation of ASA is required in the perioperative period, it may be stopped 5-7 days prior to surgery with a plan to resume it as soon as felt to be feasible from a surgical standpoint in the post-operative period.   A copy of this note will be routed to requesting surgeon.  Time:   Today, I have spent 6 minutes with the patient with telehealth technology discussing medical history, symptoms, and management plan.     Joylene Grapes, NP  10/18/2022, 10:09 AM

## 2022-10-24 NOTE — Telephone Encounter (Signed)
Called and spoke to patient about recommendation to increase INR goal to 2.5 - 3.5.  He has an INR appt on Thursday.  Will wait and see results of INR then increase dose at that time if needed.  Pending EVAR.  Date TBD. Will hold warfarin 5 days before procedure and bridge with lovenox.

## 2022-10-24 NOTE — Telephone Encounter (Signed)
Spoke with Dr. Graciela Husbands. He is in agreement to increase INR goal. Misty Stanley please let pt know goal has increased to 2.5-3.5. Please make adjustments as needed. He will need bridge for procedure TBD

## 2022-10-25 ENCOUNTER — Other Ambulatory Visit: Payer: Self-pay

## 2022-10-25 DIAGNOSIS — I7143 Infrarenal abdominal aortic aneurysm, without rupture: Secondary | ICD-10-CM

## 2022-10-26 ENCOUNTER — Ambulatory Visit: Payer: Medicare Other | Attending: Internal Medicine | Admitting: *Deleted

## 2022-10-26 DIAGNOSIS — Z952 Presence of prosthetic heart valve: Secondary | ICD-10-CM

## 2022-10-26 DIAGNOSIS — Z5181 Encounter for therapeutic drug level monitoring: Secondary | ICD-10-CM | POA: Diagnosis not present

## 2022-10-26 LAB — POCT INR: POC INR: 3.3

## 2022-10-26 MED ORDER — ENOXAPARIN SODIUM 60 MG/0.6ML IJ SOSY
60.0000 mg | PREFILLED_SYRINGE | Freq: Two times a day (BID) | INTRAMUSCULAR | 1 refills | Status: DC
Start: 2022-11-05 — End: 2022-12-28

## 2022-10-26 NOTE — Patient Instructions (Signed)
Description   Continue taking 1/2 tablet daily except for 1 tablet on Tuesdays  Back up to Amiodarone 300mg  daily Please follow bridge instructions.  Recheck INR 1 week post procedure.       6/7: Last dose of warfarin.  6/8: No warfarin or enoxaparin (Lovenox).  6/9: Inject enoxaparin 60 mg in the fatty abdominal tissue at least 2 inches from the belly button twice a day about 12 hours apart, 8am and 8pm rotate sites. No warfarin.  6/10: Inject enoxaparin in the fatty tissue every 12 hours, 8am and 8pm. No warfarin.  6/11: Inject enoxaparin in the fatty tissue every 12 hours, 8am and 8pm. No warfarin.  6/12: Inject enoxaparin in the fatty tissue in the morning at 8 am (No PM dose). No warfarin.  6/13: Procedure Day - No enoxaparin - Resume warfarin in the evening or as directed by doctor   6/14: Resume enoxaparin inject in the fatty tissue every 12 hours and take warfarin  6/15: Inject enoxaparin in the fatty tissue every 12 hours and take warfarin  6/16: Inject enoxaparin in the fatty tissue every 12 hours and take warfarin  6/17: Inject enoxaparin in the fatty tissue every 12 hours and take warfarin  6/18: Inject enoxaparin in the fatty tissue every 12 hours and take warfarin  6/19: warfarin appt to check INR.

## 2022-10-29 ENCOUNTER — Encounter (HOSPITAL_COMMUNITY): Payer: Self-pay | Admitting: Emergency Medicine

## 2022-10-29 ENCOUNTER — Emergency Department (HOSPITAL_COMMUNITY)
Admission: EM | Admit: 2022-10-29 | Discharge: 2022-10-30 | Disposition: A | Discharge status: Home / Self Care | Payer: Medicare Other | Attending: Emergency Medicine | Admitting: Emergency Medicine

## 2022-10-29 ENCOUNTER — Other Ambulatory Visit: Payer: Self-pay

## 2022-10-29 DIAGNOSIS — Z7982 Long term (current) use of aspirin: Secondary | ICD-10-CM | POA: Diagnosis not present

## 2022-10-29 DIAGNOSIS — R040 Epistaxis: Secondary | ICD-10-CM | POA: Diagnosis present

## 2022-10-29 DIAGNOSIS — Z7901 Long term (current) use of anticoagulants: Secondary | ICD-10-CM | POA: Insufficient documentation

## 2022-10-29 LAB — CBC
HCT: 36.9 % — ABNORMAL LOW (ref 39.0–52.0)
Hemoglobin: 13.4 g/dL (ref 13.0–17.0)
MCH: 45.9 pg — ABNORMAL HIGH (ref 26.0–34.0)
MCHC: 36.3 g/dL — ABNORMAL HIGH (ref 30.0–36.0)
MCV: 126.4 fL — ABNORMAL HIGH (ref 80.0–100.0)
Platelets: 348 K/uL (ref 150–400)
RBC: 2.92 MIL/uL — ABNORMAL LOW (ref 4.22–5.81)
RDW: 14.2 % (ref 11.5–15.5)
WBC: 2.9 K/uL — ABNORMAL LOW (ref 4.0–10.5)
nRBC: 0 % (ref 0.0–0.2)

## 2022-10-29 LAB — PROTIME-INR
INR: 2.7 — ABNORMAL HIGH (ref 0.8–1.2)
Prothrombin Time: 29.3 seconds — ABNORMAL HIGH (ref 11.4–15.2)

## 2022-10-29 MED ORDER — OXYMETAZOLINE HCL 0.05 % NA SOLN
1.0000 | Freq: Once | NASAL | Status: AC
  Administered 2022-10-29: 1 via NASAL
  Filled 2022-10-29: qty 30

## 2022-10-29 MED ORDER — TRANEXAMIC ACID FOR EPISTAXIS
500.0000 mg | Freq: Once | TOPICAL | Status: DC
  Filled 2022-10-29: qty 10

## 2022-10-29 NOTE — ED Notes (Signed)
Pt states he has exceeded limit on Afrin at home/ he states he has uses 3-4x already today

## 2022-10-29 NOTE — ED Triage Notes (Signed)
Pt c/o nosebleed from both nostrils since 5:50pm. Takes coumadin 5mg  daily, recent INR 3.3 earlier this week. Pt denies dizziness, SOB, CP, pain. A/O and ambulatory on arrival

## 2022-10-29 NOTE — ED Provider Notes (Signed)
[facility] EMERGENC[facility] DE[facility]MENT AT [facility]  Provider Note  CSN: [id number] Arrival date & time: 10/29/22 2052  History Chief Complaint  Patient presents with   Epistaxis    Taylor [name] is a 72 y.o. male on coumadin for mechanical heart valve brought to the ED by wife for evaluation of nose bleeding, R>L for the last several hours, not improved with afrin use at home.    Home Medications Prior to Admission medications   Medication Sig Start Date End Date Taking? Authorizing Provider  amiodarone (PACERONE) 200 MG tablet To replace Amiodarone 100 mg. Take 1/2 tablet (100 mg) PO in the morning and 1 tablets (200 mg) PO at night. 07/17/22   Duke Salvia, MD  amoxicillin (AMOXIL) 500 MG capsule Take 4 capsules (2.000mg ) one hour before dental procedures. 08/17/21   Bensimhon, Bevelyn Buckles, MD  Ascorbic Acid ([employer] VITAMIN C PO) Take 1 tablet by mouth daily. 60 mg Vit C and 15 mg Zinc ( combo)    [provider]  aspirin 81 MG tablet Take 81 mg by mouth daily.    [provider]  benzonatate (TESSALON) 100 MG capsule Take 100 mg by mouth 3 (three) times daily as needed for cough. 09/09/21   [provider]  carvedilol (COREG) 3.125 MG tablet TAKE 1 TABLET(3.125 MG) BY MOUTH TWICE DAILY WITH A MEAL 08/18/20   Bensimhon, Bevelyn Buckles, MD  dapagliflozin propanediol (FARXIGA) 10 MG TABS tablet Take 1 tablet (10 mg total) by mouth daily. 10/17/22   Bensimhon, Bevelyn Buckles, MD  enoxaparin (LOVENOX) 60 MG/0.6ML injection Inject 0.6 mLs (60 mg total) into the skin every 12 (twelve) hours. 11/05/22   Camnitz, Andree Coss, MD  furosemide (LASIX) 20 MG tablet Take 1 tablet (20 mg total) by mouth daily. 05/15/22 05/15/23  Jacklynn Ganong, FNP  hydroxyurea (HYDREA) 500 MG capsule Take 2 capsules (1,000 mg total) by mouth every Tuesday, Thursday, Saturday, and Sunday AND 3 capsules (1,500 mg total) every Monday, Wednesday, and Friday. May take with food to minimize GI side effects..  08/03/22   Carnella Guadalajara, PA-C  levothyroxine (SYNTHROID) 75 MCG tablet Take 75 mcg by mouth daily before breakfast. 06/21/20   [provider]  potassium chloride SA (KLOR-CON M) 10 MEQ tablet Take 1 tablet (10 mEq total) by mouth daily. 05/15/22   Jacklynn Ganong, FNP  spironolactone (ALDACTONE) 25 MG tablet TAKE 1/2 TABLET(12.5 MG) BY MOUTH DAILY 03/15/22   Laurey Morale, MD  traZODone (DESYREL) 50 MG tablet Take by mouth. 02/15/22   [provider]  vitamin B-12 (CYANOCOBALAMIN) 1000 MCG tablet Take 1,000 mcg by mouth daily.    [provider]  Vitamin D, Ergocalciferol, (DRISDOL) 1.25 MG (50000 UNIT) CAPS capsule Take 50,000 Units by mouth daily.    [provider]  warfarin (COUMADIN) 5 MG tablet Take 0.5-1 tablets (2.5-5 mg total) by mouth See admin instructions. Take 1 tablet daily except 1/2 tablet on Mondays and Fridays 04/14/22   Sheilah Pigeon, PA-C     Allergies    Erythromycin, Rosuvastatin calcium, Simvastatin, and Zithromax [azithromycin]   Review of Systems   Review of Systems Please see HPI for pertinent positives and negatives  Physical Exam BP 100/88 (BP Location: Right Arm)   Pulse 75   Temp 98.1 F (36.7 C) (Oral)   Resp 18   Ht 5\' 9"  (1.753 m)   Wt 70.3 kg   SpO2 95%   BMI 22.89 kg/m  Physical Exam Vitals and nursing note reviewed.  HENT:     Head: Normocephalic.     Nose:     Comments: Bilateral clots in nares. Clots blown out and no active bleeding on left. There is a site on R nasal septum that is the likely source of bleeding, not actively bleeding now. Afrin instilled and pressure held.  Eyes:     Extraocular Movements: Extraocular movements intact.  Pulmonary:     Effort: Pulmonary effort is normal.  Musculoskeletal:        General: Normal range of motion.     Cervical back: Neck supple.  Skin:    Findings: No rash (on exposed skin).  Neurological:     Mental Status: He is alert and oriented  to person, place, and time.  Psychiatric:        Mood and Affect: Mood normal.     ED Results / Procedures / Treatments   EKG None  Procedures Procedures  Medications Ordered in the ED Medications  tranexamic acid (CYKLOKAPRON) 1000 MG/10ML topical solution 500 mg (0 mg Topical Hold 10/29/22 2324)  oxymetazoline (AFRIN) 0.05 % nasal spray 1 spray (1 spray Each Nare Given 10/29/22 2324)    Initial Impression and Plan  Patient here with nosebleed. Large clot expelled from the R naris, smaller clot from L naris. No obvious active bleeding afterwards. Will hold pressure and reassess for source amenable to cautery.   ED Course   Clinical Course as of 10/30/22 0102  Wynelle Link Oct 29, 2022  2332 CBC with normal Hgb. INR is therapeutic.  [CS]  Mon Oct 30, 2022  0014 Clamp removed, no obvious bleeding now. Will continue to monitor.  [CS]  0101 Bleeding remains controlled. Patient is eager to go home. Given instructions on management of epistaxis at home. Advised to RTED if bleeding is not controlled. [CS]    Clinical Course User Index [CS] Pollyann Savoy, MD     MDM Rules/Calculators/A&P Medical Decision Making Problems Addressed: Right-sided epistaxis: acute illness or injury  Amount and/or Complexity of Data Reviewed Labs: ordered. Decision-making details documented in ED Course.  Risk OTC drugs.     Final Clinical Impression(s) / ED Diagnoses Final diagnoses:  Right-sided epistaxis    Rx / DC Orders ED Discharge Orders     None        Pollyann Savoy, MD 10/30/22 [phone]

## 2022-10-31 NOTE — Progress Notes (Signed)
     Your surgery and Pre-Admission testing visit will be at [facility] located at [address], [place], [place] [address].  Please let all your doctors (i.e., Primary Care Physician, Cardiologist, Endocrinologist, Pulmonologist) know you are having surgery. You may need clearance for surgery. If you are on blood thinners, notify your surgeon and ask the doctor who prescribed them how long to hold them before surgery.  If you have had a heart test, such as an EKG, stress test, heart ultrasound, etc., or lab work performed outside of [facility] [facility], please bring copies of these tests to your Pre-Admission testing, if possible.  These departments may contact you before the day of surgery:  Pre-Service Center - insurance/ billing: [phone] Pharmacy- to review your medications: [phone] Pre-Admission Testing- to set an appointment for your visit: [phone]  (Often, these numbers show up as "SPAM" on your phone)  The Pre-Admission Testing (PAT) visit focuses on Anesthesia for your upcoming surgery.  You do NOT need to fast; take your medications as usual. Please arrive 30 minutes early to allow for parking and admitting.  The visit may last up to an hour. Bring a photo ID and medical insurance card. Reschedule if you are sick. ([phone]) and please, NO children under age 16 at the visit.  During the PAT visit:  We will review your medical and surgical history.   You will receive pre-operative instructions, including the time of arrival at the hospital and surgical start time.  We will review what medication(s) you can take on the day of surgery.  After speaking with the nurse, you will have blood drawn and, if needed, a chest x-ray and EKG.  Most lab results from your doctor are good for 30 days, Hemoglobin A1C is good for 60 days. If you cannot talk to the Pharmacy, bring your medications or a list of them to the PST visit.   Infection control for the [facility] requires: All fingernail and toenail products should be removed before the day of surgery.  (SNS, Acrylic, Gel, Polish, Stickers, Press on, and Poly gel nails.)   Parking information:  Address: [facility] - [address], [place], [place] [address]  Please look for signs for entrance A off of [address]. Free valet parking is available Monday-Friday 05:30am-06:00pm     

## 2022-11-01 ENCOUNTER — Encounter: Payer: Self-pay | Admitting: Cardiology

## 2022-11-01 NOTE — Progress Notes (Signed)
PERIOPERATIVE PRESCRIPTION FOR IMPLANTED CARDIAC DEVICE PROGRAMMING  Patient Information: Name:  Taylor [name]  DOB:  [birth year]  MRN:  [id number]    Planned Procedure:  Abdominal aortic endovascular stent graft  Surgeon:  Dr. Nada Libman  Date of Procedure:  11/09/22  Cautery will be used.  Position during surgery:  supine   Please send documentation back to:  [facility] (Fax # [phone])   Device Information:  Clinic EP Physician:  Loman Brooklyn, MD   Device Type:  Defibrillator Manufacturer and Phone #:  [employer]: [phone] Pacemaker Dependent?:  Unknown Date of Last Device Check:  08/03/22 Normal Device Function?:  Yes.    Electrophysiologist's Recommendations:  Have magnet available. Provide continuous ECG monitoring when magnet is used or reprogramming is to be performed.  Procedure may interfere with device function.  Magnet should be placed over device during procedure.  Per Device Clinic Standing Orders, Lenor Coffin, RN  1:22 PM 11/01/2022

## 2022-11-01 NOTE — Pre-Procedure Instructions (Signed)
Surgical Instructions    Your procedure is scheduled on Thursday, June 13th.  Report to [facility] Main Entrance "A" at 05:30 A.M., then check in with the Admitting office.  Call this number if you have problems the morning of surgery:  [phone]  If you have any questions prior to your surgery date call [phone]: Open Monday-Friday 8am-4pm If you experience any cold or flu symptoms such as cough, fever, chills, shortness of breath, etc. between now and your scheduled surgery, please notify us at the above number.     Remember:  Do not eat or drink after midnight the night before your surgery     Take these medicines the morning of surgery with A SIP OF WATER  amiodarone (PACERONE)  carvedilol (COREG)  hydroxyurea (HYDREA)  levothyroxine (SYNTHROID)    Hold warfarin (COUMADIN) for 5 days prior to surgery. Follow instructions regarding enoxaparin (LOVENOX) bridge.   Hold dapagliflozin propanediol (FARXIGA) 72 hours prior to surgery. Last dose 6/9.   As of today, STOP taking any Aleve, Naproxen, Ibuprofen, Motrin, Advil, Goody's, BC's, all herbal medications, fish oil, and all vitamins.                     Do NOT Smoke (Tobacco/Vaping) for 24 hours prior to your procedure.  If you use a CPAP at night, you may bring your mask/headgear for your overnight stay.   Contacts, glasses, piercing's, hearing aid's, dentures or partials may not be worn into surgery, please bring cases for these belongings.    For patients admitted to the hospital, discharge time will be determined by your treatment team.   Patients discharged the day of surgery will not be allowed to drive home, and someone needs to stay with them for 24 hours.  SURGICAL WAITING ROOM VISITATION Patients having surgery or a procedure may have no more than 2 support people in the waiting area - these visitors may rotate.   Children under the age of 63 must have an adult with them who is not the patient. If the  patient needs to stay at the hospital during part of their recovery, the visitor guidelines for inpatient rooms apply. Pre-op nurse will coordinate an appropriate time for 1 support person to accompany patient in pre-op.  This support person may not rotate.   Please refer to the [facility] website for the visitor guidelines for Inpatients (after your surgery is over and you are in a regular room).    Special instructions:   [facility]- Preparing For Surgery  Before surgery, you can play an important role. Because skin is not sterile, your skin needs to be as free of germs as possible. You can reduce the number of germs on your skin by washing with CHG (chlorahexidine gluconate) Soap before surgery.  CHG is an antiseptic cleaner which kills germs and bonds with the skin to continue killing germs even after washing.    Oral Hygiene is also important to reduce your risk of infection.  Remember - BRUSH YOUR TEETH THE MORNING OF SURGERY WITH YOUR REGULAR TOOTHPASTE  Please do not use if you have an allergy to CHG or antibacterial soaps. If your skin becomes reddened/irritated stop using the CHG.  Do not shave (including legs and underarms) for at least 48 hours prior to first CHG shower. It is OK to shave your face.  Please follow these instructions carefully.   Shower the NIGHT BEFORE SURGERY and the MORNING OF SURGERY  If you chose to  wash your hair, wash your hair first as usual with your normal shampoo.  After you shampoo, rinse your hair and body thoroughly to remove the shampoo.  Use CHG Soap as you would any other liquid soap. You can apply CHG directly to the skin and wash gently with a scrungie or a clean washcloth.   Apply the CHG Soap to your body ONLY FROM THE NECK DOWN.  Do not use on open wounds or open sores. Avoid contact with your eyes, ears, mouth and genitals (private parts). Wash Face and genitals (private parts)  with your normal soap.   Wash thoroughly, paying special  attention to the area where your surgery will be performed.  Thoroughly rinse your body with warm water from the neck down.  DO NOT shower/wash with your normal soap after using and rinsing off the CHG Soap.  Pat yourself dry with a CLEAN TOWEL.  Wear CLEAN PAJAMAS to bed the night before surgery  Place CLEAN SHEETS on your bed the night before your surgery  DO NOT SLEEP WITH PETS.   Day of Surgery: Take a shower with CHG soap. Do not wear jewelry  Do not wear lotions, powders, colognes, or deodorant. Men may shave face and neck. Do not bring valuables to the hospital. [facility] is not responsible for any belongings or valuables. Wear Clean/Comfortable clothing the morning of surgery Remember to brush your teeth WITH YOUR REGULAR TOOTHPASTE.   Please read over the following fact sheets that you were given.    If you received a COVID test during your pre-op visit  it is requested that you wear a mask when out in public, stay away from anyone that may not be feeling well and notify your surgeon if you develop symptoms. If you have been in contact with anyone that has tested positive in the last 10 days please notify you surgeon.

## 2022-11-02 ENCOUNTER — Ambulatory Visit (INDEPENDENT_AMBULATORY_CARE_PROVIDER_SITE_OTHER): Payer: Medicare Other

## 2022-11-02 ENCOUNTER — Other Ambulatory Visit: Payer: Self-pay

## 2022-11-02 ENCOUNTER — Encounter (HOSPITAL_COMMUNITY): Payer: Self-pay

## 2022-11-02 ENCOUNTER — Encounter (HOSPITAL_COMMUNITY)
Admission: RE | Admit: 2022-11-02 | Discharge: 2022-11-02 | Disposition: A | Discharge status: Home / Self Care | Payer: Medicare Other | Source: Ambulatory Visit | Attending: Surgery | Admitting: Surgery

## 2022-11-02 VITALS — BP 126/60 | HR 64 | Temp 98.0°F | Resp 18 | Ht 69.0 in | Wt 151.9 lb

## 2022-11-02 DIAGNOSIS — Z01818 Encounter for other preprocedural examination: Secondary | ICD-10-CM | POA: Diagnosis present

## 2022-11-02 DIAGNOSIS — I472 Ventricular tachycardia, unspecified: Secondary | ICD-10-CM

## 2022-11-02 DIAGNOSIS — Z7901 Long term (current) use of anticoagulants: Secondary | ICD-10-CM | POA: Diagnosis not present

## 2022-11-02 DIAGNOSIS — I7143 Infrarenal abdominal aortic aneurysm, without rupture: Secondary | ICD-10-CM

## 2022-11-02 HISTORY — DX: Thrombocytosis, unspecified: D75.839

## 2022-11-02 HISTORY — DX: Hypothyroidism, unspecified: E03.9

## 2022-11-02 HISTORY — DX: Abdominal aortic aneurysm, without rupture, unspecified: I71.40

## 2022-11-02 LAB — COMPREHENSIVE METABOLIC PANEL
ALT: 31 U/L (ref 0–44)
AST: 38 U/L (ref 15–41)
Albumin: 3.7 g/dL (ref 3.5–5.0)
Alkaline Phosphatase: 68 U/L (ref 38–126)
Anion gap: 10 (ref 5–15)
BUN: 21 mg/dL (ref 8–23)
CO2: 26 mmol/L (ref 22–32)
Calcium: 9 mg/dL (ref 8.9–10.3)
Chloride: 99 mmol/L (ref 98–111)
Creatinine, Ser: 1.51 mg/dL — ABNORMAL HIGH (ref 0.61–1.24)
GFR, Estimated: 49 mL/min — ABNORMAL LOW (ref >60)
Glucose, Bld: 100 mg/dL — ABNORMAL HIGH (ref 70–99)
Potassium: 4.5 mmol/L (ref 3.5–5.1)
Sodium: 135 mmol/L (ref 135–145)
Total Bilirubin: 0.8 mg/dL (ref 0.3–1.2)
Total Protein: 6.9 g/dL (ref 6.5–8.1)

## 2022-11-02 LAB — CBC
HCT: 35.8 % — ABNORMAL LOW (ref 39.0–52.0)
Hemoglobin: 12.5 g/dL — ABNORMAL LOW (ref 13.0–17.0)
MCH: 45.3 pg — ABNORMAL HIGH (ref 26.0–34.0)
MCHC: 34.9 g/dL (ref 30.0–36.0)
MCV: 129.7 fL — ABNORMAL HIGH (ref 80.0–100.0)
Platelets: 339 10*3/uL (ref 150–400)
RBC: 2.76 MIL/uL — ABNORMAL LOW (ref 4.22–5.81)
RDW: 14.7 % (ref 11.5–15.5)
WBC: 2.6 10*3/uL — ABNORMAL LOW (ref 4.0–10.5)
nRBC: 0 % (ref 0.0–0.2)

## 2022-11-02 LAB — URINALYSIS, ROUTINE W REFLEX MICROSCOPIC
Bacteria, UA: NONE SEEN
Bilirubin Urine: NEGATIVE
Glucose, UA: >=500 mg/dL — AB
Hgb urine dipstick: NEGATIVE
Ketones, ur: NEGATIVE mg/dL
Leukocytes,Ua: NEGATIVE
Nitrite: NEGATIVE
Protein, ur: NEGATIVE mg/dL
Specific Gravity, Urine: 1.007 (ref 1.005–1.030)
pH: 5 (ref 5.0–8.0)

## 2022-11-02 LAB — SURGICAL PCR SCREEN
MRSA, PCR: NEGATIVE
Staphylococcus aureus: NEGATIVE

## 2022-11-02 LAB — CUP PACEART REMOTE DEVICE CHECK
Battery Remaining Longevity: 60 mo
Battery Remaining Percentage: 83 %
Brady Statistic RA Percent Paced: 2 %
Brady Statistic RV Percent Paced: 95 %
Date Time Interrogation Session: 20240606004100
HighPow Impedance: 64 Ohm
Implantable Lead Connection Status: 753985
Implantable Lead Connection Status: 753985
Implantable Lead Connection Status: 753985
Implantable Lead Implant Date: 20180105
Implantable Lead Implant Date: 20180105
Implantable Lead Implant Date: 20180105
Implantable Lead Location: 753858
Implantable Lead Location: 753859
Implantable Lead Location: 753860
Implantable Lead Model: 292
Implantable Lead Model: 4672
Implantable Lead Model: 7741
Implantable Lead Serial Number: 428973
Implantable Lead Serial Number: 801088
Implantable Lead Serial Number: 849519
Implantable Pulse Generator Implant Date: 20180105
Lead Channel Impedance Value: 643 Ohm
Lead Channel Impedance Value: 663 Ohm
Lead Channel Impedance Value: 690 Ohm
Lead Channel Pacing Threshold Amplitude: 0.7 V
Lead Channel Pacing Threshold Amplitude: 0.7 V
Lead Channel Pacing Threshold Amplitude: 1 V
Lead Channel Pacing Threshold Pulse Width: 0.4 ms
Lead Channel Pacing Threshold Pulse Width: 0.4 ms
Lead Channel Pacing Threshold Pulse Width: 1 ms
Lead Channel Setting Pacing Amplitude: 2 V
Lead Channel Setting Pacing Amplitude: 2 V
Lead Channel Setting Pacing Amplitude: 2.2 V
Lead Channel Setting Pacing Pulse Width: 0.4 ms
Lead Channel Setting Pacing Pulse Width: 1 ms
Lead Channel Setting Sensing Sensitivity: 0.6 mV
Lead Channel Setting Sensing Sensitivity: 1 mV
Pulse Gen Serial Number: 180139
Zone Setting Status: 755011

## 2022-11-02 LAB — APTT: aPTT: 37 seconds — ABNORMAL HIGH (ref 24–36)

## 2022-11-02 LAB — PROTIME-INR
INR: 2.4 — ABNORMAL HIGH (ref 0.8–1.2)
Prothrombin Time: 26.1 seconds — ABNORMAL HIGH (ref 11.4–15.2)

## 2022-11-02 NOTE — Progress Notes (Signed)
PCP - Dr. Renaldo Harrison Cardiologist - denies HF- Dr. Arvilla Meres EP- Dr. Loman Brooklyn  PPM/ICD - [facility] Scientific ICD Device Orders - in chart Rep Notified - n/a  Chest x-ray - 05/17/20 EKG - 05/26/22 Stress Test - 06/28/14 ECHO - 10/18/21 Cardiac Cath - 06/02/16 (pt says he thought this was in 2017)  Sleep Study - denies   DM- denies  Blood Thinner Instructions: Hold Coumadin for 5 days. Pt states he has the instructions at home for the lovenox bridge. He thinks he starts the lovenox on 6/9.  Aspirin Instructions: continue, including DOS  ERAS Protcol - no, NPO   COVID TEST- n/a   Anesthesia review: yes, cardiac hx. ICD. Pt had recent ED visit for a nosebleed that lasted "4 hours." Pt states that he has been using Afrin but has not had any more nose bleeds since 6/3.  Patient denies shortness of breath, fever, cough and chest pain at PAT appointment   All instructions explained to the patient, with a verbal understanding of the material. Patient agrees to go over the instructions while at home for a better understanding.  The opportunity to ask questions was provided.

## 2022-11-03 NOTE — Anesthesia Preprocedure Evaluation (Signed)
Anesthesia Evaluation  Patient identified by MRN, date of birth, ID band Patient awake    Reviewed: Allergy & Precautions, H&P , NPO status , Patient's Chart, lab work & pertinent test results  Airway Mallampati: II  TM Distance: >3 FB Neck ROM: Full    Dental no notable dental hx. (+) Edentulous Upper, Partial Lower, Dental Advisory Given   Pulmonary neg pulmonary ROS, former smoker   Pulmonary exam normal breath sounds clear to auscultation       Cardiovascular Exercise Tolerance: Good hypertension, Pt. on medications and Pt. on home beta blockers + CAD, + CABG and +CHF  + dysrhythmias  Rhythm:Regular Rate:Normal     Neuro/Psych negative neurological ROS  negative psych ROS   GI/Hepatic negative GI ROS, Neg liver ROS,,,  Endo/Other  Hypothyroidism    Renal/GU negative Renal ROS  negative genitourinary   Musculoskeletal   Abdominal   Peds  Hematology  (+) Blood dyscrasia, anemia   Anesthesia Other Findings   Reproductive/Obstetrics negative OB ROS                             Anesthesia Physical Anesthesia Plan  ASA: 4  Anesthesia Plan: General   Post-op Pain Management: Tylenol PO (pre-op)*   Induction: Intravenous  PONV Risk Score and Plan: 3 and Ondansetron, Dexamethasone and Treatment may vary due to age or medical condition  Airway Management Planned: Oral ETT  Additional Equipment: Arterial line, CVP and Ultrasound Guidance Line Placement  Intra-op Plan:   Post-operative Plan: Extubation in OR  Informed Consent: I have reviewed the patients History and Physical, chart, labs and discussed the procedure including the risks, benefits and alternatives for the proposed anesthesia with the patient or authorized representative who has indicated his/her understanding and acceptance.     Dental advisory given  Plan Discussed with: CRNA  Anesthesia Plan Comments: (PAT note  by Antionette Poles, PA-C: Follows with cardiology for hx of  CAD s/p CABG x 3 with redo mechanical AVR 04/2016, ICM, chronic systolic heart failure, valvular heart disease s/p mechanical aortic/mitral valve replacement, V-fib arrest s/p [employer] CRT-D implanted 05/2016, paroxysmal atrial fibrillation/flutter s/p ablation at [facility] 11/2021, hypertension, hyperlipidemia, AAA and essential thrombocytopenia. Seen by Bernadene Person, NP 10/18/22 for preop eval. Pre note, "According to the Revised Cardiac Risk Index (RCRI), his Perioperative Risk of Major Cardiac Event is (%): 11. His Functional Capacity in METs is: 5.81 according to the Duke Activity Status Index (DASI).Therefore, based on ACC/AHA guidelines, patient would be at acceptable risk for the planned procedure without further cardiovascular testing. The patient was advised that if he develops new symptoms prior to surgery to contact our office to arrange for a follow-up visit, and he verbalized understanding. Per office protocol, patient can hold warfarin for 5 days prior to procedure.   Patient WILL need bridging with Lovenox (enoxaparin) around procedure.  Patient was advised that he will need to contact the Coumadin clinic to coordinate Lovenox bridge once surgery date is planned.  Please resume warfarin as soon as possible postprocedure, the discretion of the surgeon. Regarding ASA therapy, we recommend continuation of ASA throughout the perioperative period.  However, if the surgeon feels that cessation of ASA is required in the perioperative period, it may be stopped 5-7 days prior to surgery with a plan to resume it as soon as felt to be feasible from a surgical standpoint in the post-operative period."  Patient reports he was instructed  to hold Coumadin 5 days.  He has Lovenox bridge instructions.  Follows with vascular surgery for history of AAA, 5.1 cm, endovascular repair recommended.  Preop labs reviewed, creatinine mildly elevated 1.51, mild  anemia with hemoglobin 12.5, chronic leukocytopenia with WBC 2.6 (felt secondary to Hydrea used to treat essential thrombocytosis, monitored by hematology).  EKG 05/26/2022: Ventricular paced.  Rate 57.  Perioperative prescription for implanted cardiac device programming per progress note 11/01/2022: Device Information:  Clinic EP Physician:  Loman Brooklyn, MD   Device Type:  Defibrillator Manufacturer and Phone #:  [employer]: [phone] Pacemaker Dependent?:  Unknown Date of Last Device Check:  3/7/24Normal Device Function?:  Yes.    Electrophysiologist's Recommendations:  ? Have magnet available. ? Provide continuous ECG monitoring when magnet is used or reprogramming is to be performed.  ? Procedure may interfere with device function.  Magnet should be placed over device during procedure.  TTE 10/18/2021: 1. Akinesis of the inferior and inferolateral walls and remaining walls  hypokinetic; overall severe LV dysfunction; S/P AVR with mild AI and  normal mean gradient.  2. Left ventricular ejection fraction, by estimation, is 25 to 30%. The  left ventricle has severely decreased function. The left ventricle  demonstrates regional wall motion abnormalities (see scoring  diagram/findings for description). The left  ventricular internal cavity size was moderately dilated. Left ventricular  diastolic parameters are consistent with Grade II diastolic dysfunction  (pseudonormalization). Elevated left atrial pressure.  3. Right ventricular systolic function is normal. The right ventricular  size is normal.  4. Left atrial size was mildly dilated.  5. The mitral valve is normal in structure. Mild mitral valve  regurgitation. No evidence of mitral stenosis.  6. The aortic valve has been repaired/replaced. Aortic valve  regurgitation is mild. No aortic stenosis is present. There is a [employer]  valve present in the aortic position.  7. Aortic dilatation noted. There  is mild dilatation of the aortic root,  measuring 39 mm.  8. The inferior vena cava is normal in size with greater than 50%  respiratory variability, suggesting right atrial pressure of 3 mmHg.    )        Anesthesia Quick Evaluation

## 2022-11-03 NOTE — Progress Notes (Signed)
Anesthesia Chart Review:  Follows with cardiology for hx of  CAD s/p CABG x 3 with redo mechanical AVR 04/2016, ICM, chronic systolic heart failure, valvular heart disease s/p mechanical aortic/mitral valve replacement, V-fib arrest s/p [employer] CRT-D implanted 05/2016, paroxysmal atrial fibrillation/flutter s/p ablation at [facility] 11/2021, hypertension, hyperlipidemia, AAA and essential thrombocytopenia. Seen by Bernadene Person, NP 10/18/22 for preop eval. Pre note, "According to the Revised Cardiac Risk Index (RCRI), his Perioperative Risk of Major Cardiac Event is (%): 11. His Functional Capacity in METs is: 5.81 according to the Duke Activity Status Index (DASI).Therefore, based on ACC/AHA guidelines, patient would be at acceptable risk for the planned procedure without further cardiovascular testing. The patient was advised that if he develops new symptoms prior to surgery to contact our office to arrange for a follow-up visit, and he verbalized understanding. Per office protocol, patient can hold warfarin for 5 days prior to procedure.   Patient WILL need bridging with Lovenox (enoxaparin) around procedure.  Patient was advised that he will need to contact the Coumadin clinic to coordinate Lovenox bridge once surgery date is planned.  Please resume warfarin as soon as possible postprocedure, the discretion of the surgeon. Regarding ASA therapy, we recommend continuation of ASA throughout the perioperative period.  However, if the surgeon feels that cessation of ASA is required in the perioperative period, it may be stopped 5-7 days prior to surgery with a plan to resume it as soon as felt to be feasible from a surgical standpoint in the post-operative period."  Patient reports he was instructed to hold Coumadin 5 days.  He has Lovenox bridge instructions.  Follows with vascular surgery for history of AAA, 5.1 cm, endovascular repair recommended.  Preop labs reviewed, creatinine mildly elevated 1.51,  mild anemia with hemoglobin 12.5, chronic leukocytopenia with WBC 2.6 (felt secondary to Hydrea used to treat essential thrombocytosis, monitored by hematology).  EKG 05/26/2022: Ventricular paced.  Rate 57.  Perioperative prescription for implanted cardiac device programming per progress note 11/01/2022: Device Information:   Clinic EP Physician:  Loman Brooklyn, MD    Device Type:  Defibrillator Manufacturer and Phone #:  [employer]: [phone] Pacemaker Dependent?:  Unknown Date of Last Device Check:  08/03/22 Normal Device Function?:  Yes.     Electrophysiologist's Recommendations:   Have magnet available. Provide continuous ECG monitoring when magnet is used or reprogramming is to be performed.  Procedure may interfere with device function.  Magnet should be placed over device during procedure.  TTE 10/18/2021:  1. Akinesis of the inferior and inferolateral walls and remaining walls  hypokinetic; overall severe LV dysfunction; S/P AVR with mild AI and  normal mean gradient.   2. Left ventricular ejection fraction, by estimation, is 25 to 30%. The  left ventricle has severely decreased function. The left ventricle  demonstrates regional wall motion abnormalities (see scoring  diagram/findings for description). The left  ventricular internal cavity size was moderately dilated. Left ventricular  diastolic parameters are consistent with Grade II diastolic dysfunction  (pseudonormalization). Elevated left atrial pressure.   3. Right ventricular systolic function is normal. The right ventricular  size is normal.   4. Left atrial size was mildly dilated.   5. The mitral valve is normal in structure. Mild mitral valve  regurgitation. No evidence of mitral stenosis.   6. The aortic valve has been repaired/replaced. Aortic valve  regurgitation is mild. No aortic stenosis is present. There is a [employer]  valve present in the aortic position.  7. Aortic dilatation noted. There is  mild dilatation of the aortic root,  measuring 39 mm.   8. The inferior vena cava is normal in size with greater than 50%  respiratory variability, suggesting right atrial pressure of 3 mmHg.      Zannie Cove [facility] Short Stay Center/Anesthesiology Phone [phone] 11/03/2022 3:52 PM

## 2022-11-06 ENCOUNTER — Other Ambulatory Visit: Payer: Self-pay | Admitting: Physician Assistant

## 2022-11-06 DIAGNOSIS — D473 Essential (hemorrhagic) thrombocythemia: Secondary | ICD-10-CM

## 2022-11-08 NOTE — Progress Notes (Signed)
Pt called to inquire about his Lovenox instructions. Pt referred back to his instructions from 10/26/22, directing him to only inject once today in am, and no pm dose or day of surgery dose. Pt reback instructions.

## 2022-11-09 ENCOUNTER — Encounter (HOSPITAL_COMMUNITY): Payer: Self-pay | Admitting: Surgery

## 2022-11-09 ENCOUNTER — Inpatient Hospital Stay (HOSPITAL_COMMUNITY): Payer: Medicare Other

## 2022-11-09 ENCOUNTER — Encounter (HOSPITAL_COMMUNITY)
Admission: RE | Disposition: A | Discharge status: Home Health | Payer: Self-pay | Source: Home / Self Care | Attending: Surgery

## 2022-11-09 ENCOUNTER — Inpatient Hospital Stay (HOSPITAL_COMMUNITY)
Admission: RE | Admit: 2022-11-09 | Discharge: 2022-11-14 | DRG: 269 | Disposition: A | Discharge status: Home Health | Payer: Medicare Other | Attending: Surgery | Admitting: Surgery

## 2022-11-09 ENCOUNTER — Inpatient Hospital Stay (HOSPITAL_COMMUNITY): Payer: Medicare Other | Admitting: Anesthesiology

## 2022-11-09 ENCOUNTER — Other Ambulatory Visit: Payer: Self-pay

## 2022-11-09 ENCOUNTER — Inpatient Hospital Stay (HOSPITAL_COMMUNITY): Payer: Medicare Other | Admitting: Physician Assistant

## 2022-11-09 DIAGNOSIS — E039 Hypothyroidism, unspecified: Secondary | ICD-10-CM

## 2022-11-09 DIAGNOSIS — I110 Hypertensive heart disease with heart failure: Secondary | ICD-10-CM | POA: Diagnosis present

## 2022-11-09 DIAGNOSIS — I70201 Unspecified atherosclerosis of native arteries of extremities, right leg: Secondary | ICD-10-CM | POA: Diagnosis present

## 2022-11-09 DIAGNOSIS — Z87891 Personal history of nicotine dependence: Secondary | ICD-10-CM

## 2022-11-09 DIAGNOSIS — Z7901 Long term (current) use of anticoagulants: Secondary | ICD-10-CM | POA: Diagnosis not present

## 2022-11-09 DIAGNOSIS — I743 Embolism and thrombosis of arteries of the lower extremities: Secondary | ICD-10-CM | POA: Diagnosis not present

## 2022-11-09 DIAGNOSIS — I509 Heart failure, unspecified: Secondary | ICD-10-CM

## 2022-11-09 DIAGNOSIS — Z8249 Family history of ischemic heart disease and other diseases of the circulatory system: Secondary | ICD-10-CM | POA: Diagnosis not present

## 2022-11-09 DIAGNOSIS — I255 Ischemic cardiomyopathy: Secondary | ICD-10-CM | POA: Diagnosis present

## 2022-11-09 DIAGNOSIS — Z79890 Hormone replacement therapy (postmenopausal): Secondary | ICD-10-CM

## 2022-11-09 DIAGNOSIS — T82868A Thrombosis of vascular prosthetic devices, implants and grafts, initial encounter: Secondary | ICD-10-CM | POA: Diagnosis not present

## 2022-11-09 DIAGNOSIS — Z952 Presence of prosthetic heart valve: Secondary | ICD-10-CM

## 2022-11-09 DIAGNOSIS — I9789 Other postprocedural complications and disorders of the circulatory system, not elsewhere classified: Secondary | ICD-10-CM | POA: Diagnosis not present

## 2022-11-09 DIAGNOSIS — I70202 Unspecified atherosclerosis of native arteries of extremities, left leg: Secondary | ICD-10-CM | POA: Diagnosis present

## 2022-11-09 DIAGNOSIS — Z951 Presence of aortocoronary bypass graft: Secondary | ICD-10-CM | POA: Diagnosis not present

## 2022-11-09 DIAGNOSIS — I2510 Atherosclerotic heart disease of native coronary artery without angina pectoris: Secondary | ICD-10-CM

## 2022-11-09 DIAGNOSIS — R52 Pain, unspecified: Secondary | ICD-10-CM | POA: Diagnosis not present

## 2022-11-09 DIAGNOSIS — D638 Anemia in other chronic diseases classified elsewhere: Secondary | ICD-10-CM

## 2022-11-09 DIAGNOSIS — I708 Atherosclerosis of other arteries: Secondary | ICD-10-CM | POA: Diagnosis present

## 2022-11-09 DIAGNOSIS — I70212 Atherosclerosis of native arteries of extremities with intermittent claudication, left leg: Secondary | ICD-10-CM | POA: Diagnosis present

## 2022-11-09 DIAGNOSIS — Z79899 Other long term (current) drug therapy: Secondary | ICD-10-CM

## 2022-11-09 DIAGNOSIS — Z95810 Presence of automatic (implantable) cardiac defibrillator: Secondary | ICD-10-CM | POA: Diagnosis not present

## 2022-11-09 DIAGNOSIS — Y832 Surgical operation with anastomosis, bypass or graft as the cause of abnormal reaction of the patient, or of later complication, without mention of misadventure at the time of the procedure: Secondary | ICD-10-CM | POA: Diagnosis present

## 2022-11-09 DIAGNOSIS — Z881 Allergy status to other antibiotic agents status: Secondary | ICD-10-CM | POA: Diagnosis not present

## 2022-11-09 DIAGNOSIS — I5022 Chronic systolic (congestive) heart failure: Secondary | ICD-10-CM | POA: Diagnosis present

## 2022-11-09 DIAGNOSIS — D473 Essential (hemorrhagic) thrombocythemia: Secondary | ICD-10-CM | POA: Diagnosis present

## 2022-11-09 DIAGNOSIS — I7143 Infrarenal abdominal aortic aneurysm, without rupture: Secondary | ICD-10-CM

## 2022-11-09 DIAGNOSIS — E7800 Pure hypercholesterolemia, unspecified: Secondary | ICD-10-CM | POA: Diagnosis present

## 2022-11-09 DIAGNOSIS — Z7984 Long term (current) use of oral hypoglycemic drugs: Secondary | ICD-10-CM | POA: Diagnosis not present

## 2022-11-09 DIAGNOSIS — Z888 Allergy status to other drugs, medicaments and biological substances status: Secondary | ICD-10-CM | POA: Diagnosis not present

## 2022-11-09 DIAGNOSIS — Z7982 Long term (current) use of aspirin: Secondary | ICD-10-CM

## 2022-11-09 DIAGNOSIS — Z5181 Encounter for therapeutic drug level monitoring: Secondary | ICD-10-CM

## 2022-11-09 DIAGNOSIS — I714 Abdominal aortic aneurysm, without rupture, unspecified: Principal | ICD-10-CM | POA: Diagnosis present

## 2022-11-09 DIAGNOSIS — I739 Peripheral vascular disease, unspecified: Secondary | ICD-10-CM | POA: Diagnosis present

## 2022-11-09 HISTORY — PX: ABDOMINAL AORTIC ENDOVASCULAR STENT GRAFT: SHX5707

## 2022-11-09 HISTORY — PX: ENDARTERECTOMY FEMORAL: SHX5804

## 2022-11-09 HISTORY — DX: Presence of cardiac pacemaker: Z95.0

## 2022-11-09 HISTORY — PX: ULTRASOUND GUIDANCE FOR VASCULAR ACCESS: SHX6516

## 2022-11-09 HISTORY — PX: PATCH ANGIOPLASTY: SHX6230

## 2022-11-09 HISTORY — DX: Presence of automatic (implantable) cardiac defibrillator: Z95.810

## 2022-11-09 LAB — TYPE AND SCREEN
ABO/RH(D): A POS
Unit division: 0
Unit division: 0
Unit division: 0

## 2022-11-09 LAB — POCT ACTIVATED CLOTTING TIME
Activated Clotting Time: 220 seconds
Activated Clotting Time: 238 seconds
Activated Clotting Time: 207 seconds
Activated Clotting Time: 214 seconds
Activated Clotting Time: 226 seconds
Activated Clotting Time: 213 seconds

## 2022-11-09 LAB — POCT I-STAT 7, (LYTES, BLD GAS, ICA,H+H)
Acid-base deficit: 1 mmol/L (ref 0.0–2.0)
Acid-base deficit: 1 mmol/L (ref 0.0–2.0)
Bicarbonate: 25.1 mmol/L (ref 20.0–28.0)
Bicarbonate: 24.2 mmol/L (ref 20.0–28.0)
Calcium, Ion: 1.11 mmol/L — ABNORMAL LOW (ref 1.15–1.40)
Calcium, Ion: 1.05 mmol/L — ABNORMAL LOW (ref 1.15–1.40)
HCT: 25 % — ABNORMAL LOW (ref 39.0–52.0)
HCT: 29 % — ABNORMAL LOW (ref 39.0–52.0)
Hemoglobin: 8.5 g/dL — ABNORMAL LOW (ref 13.0–17.0)
Hemoglobin: 9.9 g/dL — ABNORMAL LOW (ref 13.0–17.0)
O2 Saturation: 100 %
O2 Saturation: 100 %
Potassium: 4.3 mmol/L (ref 3.5–5.1)
Potassium: 4.7 mmol/L (ref 3.5–5.1)
Sodium: 137 mmol/L (ref 135–145)
Sodium: 137 mmol/L (ref 135–145)
TCO2: 26 mmol/L (ref 22–32)
TCO2: 26 mmol/L (ref 22–32)
pCO2 arterial: 45.6 mmHg (ref 32–48)
pCO2 arterial: 43.8 mmHg (ref 32–48)
pH, Arterial: 7.349 — ABNORMAL LOW (ref 7.35–7.45)
pH, Arterial: 7.351 (ref 7.35–7.45)
pO2, Arterial: 214 mmHg — ABNORMAL HIGH (ref 83–108)
pO2, Arterial: 236 mmHg — ABNORMAL HIGH (ref 83–108)

## 2022-11-09 LAB — CBC
HCT: 30.1 % — ABNORMAL LOW (ref 39.0–52.0)
Hemoglobin: 10.4 g/dL — ABNORMAL LOW (ref 13.0–17.0)
MCH: 41.1 pg — ABNORMAL HIGH (ref 26.0–34.0)
MCHC: 34.6 g/dL (ref 30.0–36.0)
MCV: 119 fL — ABNORMAL HIGH (ref 80.0–100.0)
Platelets: 316 10*3/uL (ref 150–400)
RBC: 2.53 MIL/uL — ABNORMAL LOW (ref 4.22–5.81)
WBC: 2.5 10*3/uL — ABNORMAL LOW (ref 4.0–10.5)
nRBC: 0 % (ref 0.0–0.2)

## 2022-11-09 LAB — BASIC METABOLIC PANEL
Anion gap: 9 (ref 5–15)
BUN: 19 mg/dL (ref 8–23)
CO2: 23 mmol/L (ref 22–32)
Calcium: 7.9 mg/dL — ABNORMAL LOW (ref 8.9–10.3)
Chloride: 104 mmol/L (ref 98–111)
Creatinine, Ser: 1.05 mg/dL (ref 0.61–1.24)
GFR, Estimated: >60 mL/min (ref >60)
Glucose, Bld: 154 mg/dL — ABNORMAL HIGH (ref 70–99)
Potassium: 4.5 mmol/L (ref 3.5–5.1)
Sodium: 136 mmol/L (ref 135–145)

## 2022-11-09 LAB — POCT I-STAT, CHEM 8
BUN: 21 mg/dL (ref 8–23)
Calcium, Ion: 1.12 mmol/L — ABNORMAL LOW (ref 1.15–1.40)
Chloride: 103 mmol/L (ref 98–111)
Creatinine, Ser: 1.6 mg/dL — ABNORMAL HIGH (ref 0.61–1.24)
Glucose, Bld: 136 mg/dL — ABNORMAL HIGH (ref 70–99)
HCT: 25 % — ABNORMAL LOW (ref 39.0–52.0)
Hemoglobin: 8.5 g/dL — ABNORMAL LOW (ref 13.0–17.0)
Potassium: 4.2 mmol/L (ref 3.5–5.1)
Sodium: 138 mmol/L (ref 135–145)
TCO2: 26 mmol/L (ref 22–32)

## 2022-11-09 LAB — BPAM RBC
Blood Product Expiration Date: 202407052359
ISSUE DATE / TIME: 202406131148
Unit Type and Rh: 6200

## 2022-11-09 LAB — APTT: aPTT: 33 seconds (ref 24–36)

## 2022-11-09 LAB — ABO/RH: ABO/RH(D): A POS

## 2022-11-09 LAB — PREPARE RBC (CROSSMATCH)

## 2022-11-09 LAB — PROTIME-INR
INR: 1.2 (ref 0.8–1.2)
INR: 1.4 — ABNORMAL HIGH (ref 0.8–1.2)
Prothrombin Time: 15.6 seconds — ABNORMAL HIGH (ref 11.4–15.2)
Prothrombin Time: 17.2 seconds — ABNORMAL HIGH (ref 11.4–15.2)

## 2022-11-09 LAB — MAGNESIUM: Magnesium: 1.7 mg/dL (ref 1.7–2.4)

## 2022-11-09 SURGERY — INSERTION, ENDOVASCULAR STENT GRAFT, AORTA, ABDOMINAL
Anesthesia: General | Site: Groin

## 2022-11-09 MED ORDER — ENOXAPARIN SODIUM 60 MG/0.6ML IJ SOSY
60.0000 mg | PREFILLED_SYRINGE | Freq: Two times a day (BID) | INTRAMUSCULAR | Status: DC
  Administered 2022-11-10: 60 mg via SUBCUTANEOUS
  Filled 2022-11-09 (×2): qty 0.6

## 2022-11-09 MED ORDER — GUAIFENESIN-DM 100-10 MG/5ML PO SYRP: 15.0000 mL | ORAL_SOLUTION | ORAL | Status: DC | PRN

## 2022-11-09 MED ORDER — CHLORHEXIDINE GLUCONATE 0.12 % MT SOLN
15.0000 mL | Freq: Once | OROMUCOSAL | Status: AC
  Administered 2022-11-09: 15 mL via OROMUCOSAL
  Filled 2022-11-09: qty 15

## 2022-11-09 MED ORDER — ROCURONIUM BROMIDE 100 MG/10ML IV SOLN
INTRAVENOUS | Status: DC | PRN
  Administered 2022-11-09: 10 mg via INTRAVENOUS
  Administered 2022-11-09: 50 mg via INTRAVENOUS
  Administered 2022-11-09: 10 mg via INTRAVENOUS
  Administered 2022-11-09: 20 mg via INTRAVENOUS
  Administered 2022-11-09 (×3): 10 mg via INTRAVENOUS

## 2022-11-09 MED ORDER — CARVEDILOL 3.125 MG PO TABS
3.1250 mg | ORAL_TABLET | Freq: Two times a day (BID) | ORAL | Status: DC
  Administered 2022-11-10 – 2022-11-14 (×8): 3.125 mg via ORAL
  Filled 2022-11-09 (×9): qty 1

## 2022-11-09 MED ORDER — ACETAMINOPHEN 500 MG PO TABS
1000.0000 mg | ORAL_TABLET | Freq: Once | ORAL | Status: AC
  Administered 2022-11-09: 1000 mg via ORAL
  Filled 2022-11-09: qty 2

## 2022-11-09 MED ORDER — ONDANSETRON HCL 4 MG/2ML IJ SOLN
INTRAMUSCULAR | Status: DC | PRN
  Administered 2022-11-09: 4 mg via INTRAVENOUS

## 2022-11-09 MED ORDER — HEPARIN 6000 UNIT IRRIGATION SOLUTION
Status: DC | PRN
  Administered 2022-11-09 (×3): 1

## 2022-11-09 MED ORDER — ORAL CARE MOUTH RINSE: 15.0000 mL | OROMUCOSAL | Status: DC | PRN

## 2022-11-09 MED ORDER — ONDANSETRON HCL 4 MG/2ML IJ SOLN: 4.0000 mg | Freq: Four times a day (QID) | INTRAMUSCULAR | Status: DC | PRN

## 2022-11-09 MED ORDER — CHLORHEXIDINE GLUCONATE CLOTH 2 % EX PADS: 6.0000 | MEDICATED_PAD | Freq: Once | CUTANEOUS | Status: DC

## 2022-11-09 MED ORDER — FENTANYL CITRATE (PF) 100 MCG/2ML IJ SOLN: 25.0000 ug | INTRAMUSCULAR | Status: DC | PRN

## 2022-11-09 MED ORDER — METOPROLOL TARTRATE 5 MG/5ML IV SOLN: 2.0000 mg | INTRAVENOUS | Status: DC | PRN

## 2022-11-09 MED ORDER — PROPOFOL 10 MG/ML IV BOLUS
INTRAVENOUS | Status: AC
  Filled 2022-11-09: qty 20

## 2022-11-09 MED ORDER — LIDOCAINE 2% (20 MG/ML) 5 ML SYRINGE
INTRAMUSCULAR | Status: DC | PRN
  Administered 2022-11-09: 60 mg via INTRAVENOUS

## 2022-11-09 MED ORDER — SENNOSIDES-DOCUSATE SODIUM 8.6-50 MG PO TABS: 1.0000 | ORAL_TABLET | Freq: Every evening | ORAL | Status: DC | PRN

## 2022-11-09 MED ORDER — MIDAZOLAM HCL 2 MG/2ML IJ SOLN
INTRAMUSCULAR | Status: AC
  Filled 2022-11-09: qty 2

## 2022-11-09 MED ORDER — IODIXANOL 320 MG/ML IV SOLN
INTRAVENOUS | Status: DC | PRN
  Administered 2022-11-09: 62 mL via INTRA_ARTERIAL

## 2022-11-09 MED ORDER — CHLORHEXIDINE GLUCONATE CLOTH 2 % EX PADS
6.0000 | MEDICATED_PAD | Freq: Every day | CUTANEOUS | Status: DC
  Administered 2022-11-09 – 2022-11-12 (×6): 6 via TOPICAL

## 2022-11-09 MED ORDER — ETOMIDATE 2 MG/ML IV SOLN
INTRAVENOUS | Status: DC | PRN
  Administered 2022-11-09: 10 mg via INTRAVENOUS

## 2022-11-09 MED ORDER — HEPARIN SODIUM (PORCINE) 1000 UNIT/ML IJ SOLN
INTRAMUSCULAR | Status: DC | PRN
  Administered 2022-11-09: 2000 [IU] via INTRAVENOUS
  Administered 2022-11-09: 7000 [IU] via INTRAVENOUS
  Administered 2022-11-09 (×2): 2000 [IU] via INTRAVENOUS
  Administered 2022-11-09: 3000 [IU] via INTRAVENOUS
  Administered 2022-11-09: 1000 [IU] via INTRAVENOUS

## 2022-11-09 MED ORDER — TRAZODONE HCL 50 MG PO TABS
50.0000 mg | ORAL_TABLET | Freq: Every day | ORAL | Status: DC
  Administered 2022-11-09 – 2022-11-13 (×5): 50 mg via ORAL
  Filled 2022-11-09 (×5): qty 1

## 2022-11-09 MED ORDER — MAGNESIUM SULFATE 2 GM/50ML IV SOLN
2.0000 g | Freq: Every day | INTRAVENOUS | Status: DC | PRN
  Filled 2022-11-09: qty 50

## 2022-11-09 MED ORDER — WARFARIN - PHYSICIAN DOSING INPATIENT: Freq: Every day | Status: DC

## 2022-11-09 MED ORDER — ALBUMIN HUMAN 5 % IV SOLN: INTRAVENOUS | Status: DC | PRN

## 2022-11-09 MED ORDER — PROTAMINE SULFATE 10 MG/ML IV SOLN
INTRAVENOUS | Status: DC | PRN
  Administered 2022-11-09: 10 mg via INTRAVENOUS
  Administered 2022-11-09: 40 mg via INTRAVENOUS

## 2022-11-09 MED ORDER — DOCUSATE SODIUM 100 MG PO CAPS
100.0000 mg | ORAL_CAPSULE | Freq: Every day | ORAL | Status: DC
  Administered 2022-11-10 – 2022-11-13 (×4): 100 mg via ORAL
  Filled 2022-11-09 (×5): qty 1

## 2022-11-09 MED ORDER — NOREPINEPHRINE 4 MG/250ML-% IV SOLN
INTRAVENOUS | Status: DC | PRN
  Administered 2022-11-09: 2 ug/min via INTRAVENOUS

## 2022-11-09 MED ORDER — OXYCODONE-ACETAMINOPHEN 5-325 MG PO TABS
1.0000 | ORAL_TABLET | ORAL | Status: DC | PRN
  Administered 2022-11-09: 2 via ORAL
  Administered 2022-11-10 – 2022-11-12 (×4): 1 via ORAL
  Administered 2022-11-12: 2 via ORAL
  Administered 2022-11-13: 1 via ORAL
  Filled 2022-11-09 (×7): qty 1
  Filled 2022-11-09: qty 2

## 2022-11-09 MED ORDER — WARFARIN SODIUM 2.5 MG PO TABS
2.5000 mg | ORAL_TABLET | Freq: Once | ORAL | Status: AC
  Administered 2022-11-09: 2.5 mg via ORAL
  Filled 2022-11-09: qty 1

## 2022-11-09 MED ORDER — SODIUM CHLORIDE 0.9 % IV SOLN: INTRAVENOUS | Status: DC

## 2022-11-09 MED ORDER — CEFAZOLIN SODIUM-DEXTROSE 2-4 GM/100ML-% IV SOLN
2.0000 g | INTRAVENOUS | Status: AC
  Administered 2022-11-09: 2 g via INTRAVENOUS
  Filled 2022-11-09: qty 100

## 2022-11-09 MED ORDER — CEFAZOLIN SODIUM-DEXTROSE 2-4 GM/100ML-% IV SOLN
2.0000 g | Freq: Three times a day (TID) | INTRAVENOUS | Status: AC
  Administered 2022-11-09 (×2): 2 g via INTRAVENOUS
  Filled 2022-11-09 (×2): qty 100

## 2022-11-09 MED ORDER — ALUM & MAG HYDROXIDE-SIMETH 200-200-20 MG/5ML PO SUSP: 15.0000 mL | ORAL | Status: DC | PRN

## 2022-11-09 MED ORDER — 0.9 % SODIUM CHLORIDE (POUR BTL) OPTIME
TOPICAL | Status: DC | PRN
  Administered 2022-11-09: 2000 mL

## 2022-11-09 MED ORDER — LEVOTHYROXINE SODIUM 75 MCG PO TABS
75.0000 ug | ORAL_TABLET | Freq: Every day | ORAL | Status: DC
  Administered 2022-11-10 – 2022-11-14 (×5): 75 ug via ORAL
  Filled 2022-11-09 (×5): qty 1

## 2022-11-09 MED ORDER — SODIUM CHLORIDE 0.9 % IV SOLN: 500.0000 mL | Freq: Once | INTRAVENOUS | Status: DC | PRN

## 2022-11-09 MED ORDER — HYDROMORPHONE HCL 1 MG/ML IJ SOLN
0.5000 mg | INTRAMUSCULAR | Status: DC | PRN
  Filled 2022-11-09: qty 1

## 2022-11-09 MED ORDER — SPIRONOLACTONE 25 MG PO TABS
25.0000 mg | ORAL_TABLET | ORAL | Status: DC
  Administered 2022-11-10 – 2022-11-14 (×3): 25 mg via ORAL
  Filled 2022-11-09 (×3): qty 1

## 2022-11-09 MED ORDER — ALBUMIN HUMAN 5 % IV SOLN: 12.5000 g | Freq: Once | INTRAVENOUS | Status: AC

## 2022-11-09 MED ORDER — ASPIRIN 81 MG PO TABS: 81.0000 mg | ORAL_TABLET | Freq: Every day | ORAL | Status: DC

## 2022-11-09 MED ORDER — PANTOPRAZOLE SODIUM 40 MG PO TBEC
40.0000 mg | DELAYED_RELEASE_TABLET | Freq: Every day | ORAL | Status: DC
  Administered 2022-11-09 – 2022-11-14 (×6): 40 mg via ORAL
  Filled 2022-11-09 (×6): qty 1

## 2022-11-09 MED ORDER — ALBUMIN HUMAN 5 % IV SOLN
INTRAVENOUS | Status: AC
  Administered 2022-11-09: 12.5 g via INTRAVENOUS
  Filled 2022-11-09: qty 250

## 2022-11-09 MED ORDER — DEXAMETHASONE SODIUM PHOSPHATE 10 MG/ML IJ SOLN
INTRAMUSCULAR | Status: AC
  Filled 2022-11-09: qty 1

## 2022-11-09 MED ORDER — POTASSIUM CHLORIDE CRYS ER 10 MEQ PO TBCR
10.0000 meq | EXTENDED_RELEASE_TABLET | Freq: Every day | ORAL | Status: DC
  Administered 2022-11-10 – 2022-11-14 (×5): 10 meq via ORAL
  Filled 2022-11-09 (×5): qty 1

## 2022-11-09 MED ORDER — LACTATED RINGERS IV SOLN: INTRAVENOUS | Status: DC

## 2022-11-09 MED ORDER — HYDRALAZINE HCL 20 MG/ML IJ SOLN: 5.0000 mg | INTRAMUSCULAR | Status: DC | PRN

## 2022-11-09 MED ORDER — DEXAMETHASONE SODIUM PHOSPHATE 10 MG/ML IJ SOLN
INTRAMUSCULAR | Status: DC | PRN
  Administered 2022-11-09: 5 mg via INTRAVENOUS

## 2022-11-09 MED ORDER — MIDAZOLAM HCL 5 MG/5ML IJ SOLN
INTRAMUSCULAR | Status: DC | PRN
  Administered 2022-11-09 (×2): 1 mg via INTRAVENOUS

## 2022-11-09 MED ORDER — FENTANYL CITRATE (PF) 250 MCG/5ML IJ SOLN
INTRAMUSCULAR | Status: AC
  Filled 2022-11-09: qty 5

## 2022-11-09 MED ORDER — HEMOSTATIC AGENTS (NO CHARGE) OPTIME
TOPICAL | Status: DC | PRN
  Administered 2022-11-09 (×2): 1 via TOPICAL

## 2022-11-09 MED ORDER — ORAL CARE MOUTH RINSE: 15.0000 mL | Freq: Once | OROMUCOSAL | Status: AC

## 2022-11-09 MED ORDER — ASPIRIN 81 MG PO TBEC
81.0000 mg | DELAYED_RELEASE_TABLET | Freq: Every day | ORAL | Status: DC
  Administered 2022-11-10 – 2022-11-14 (×5): 81 mg via ORAL
  Filled 2022-11-09 (×5): qty 1

## 2022-11-09 MED ORDER — BISACODYL 5 MG PO TBEC
5.0000 mg | DELAYED_RELEASE_TABLET | Freq: Every day | ORAL | Status: DC | PRN
  Administered 2022-11-10 – 2022-11-11 (×2): 5 mg via ORAL
  Filled 2022-11-09 (×2): qty 1

## 2022-11-09 MED ORDER — DAPAGLIFLOZIN PROPANEDIOL 10 MG PO TABS
10.0000 mg | ORAL_TABLET | Freq: Every day | ORAL | Status: DC
  Administered 2022-11-10 – 2022-11-14 (×5): 10 mg via ORAL
  Filled 2022-11-09 (×5): qty 1

## 2022-11-09 MED ORDER — LABETALOL HCL 5 MG/ML IV SOLN: 10.0000 mg | INTRAVENOUS | Status: DC | PRN

## 2022-11-09 MED ORDER — ACETAMINOPHEN 325 MG PO TABS
325.0000 mg | ORAL_TABLET | ORAL | Status: DC | PRN
  Administered 2022-11-10 – 2022-11-14 (×6): 650 mg via ORAL
  Filled 2022-11-09 (×6): qty 2

## 2022-11-09 MED ORDER — FENTANYL CITRATE (PF) 100 MCG/2ML IJ SOLN
INTRAMUSCULAR | Status: DC | PRN
  Administered 2022-11-09 (×2): 25 ug via INTRAVENOUS
  Administered 2022-11-09: 50 ug via INTRAVENOUS

## 2022-11-09 MED ORDER — POTASSIUM CHLORIDE CRYS ER 20 MEQ PO TBCR: 20.0000 meq | EXTENDED_RELEASE_TABLET | Freq: Every day | ORAL | Status: DC | PRN

## 2022-11-09 MED ORDER — ETOMIDATE 2 MG/ML IV SOLN
INTRAVENOUS | Status: AC
  Filled 2022-11-09: qty 10

## 2022-11-09 MED ORDER — ACETAMINOPHEN 325 MG RE SUPP: 325.0000 mg | RECTAL | Status: DC | PRN

## 2022-11-09 MED ORDER — SUGAMMADEX SODIUM 200 MG/2ML IV SOLN
INTRAVENOUS | Status: DC | PRN
  Administered 2022-11-09: 150 mg via INTRAVENOUS

## 2022-11-09 MED ORDER — LACTATED RINGERS IV SOLN: INTRAVENOUS | Status: DC | PRN

## 2022-11-09 MED ORDER — PHENOL 1.4 % MT LIQD: 1.0000 | OROMUCOSAL | Status: DC | PRN

## 2022-11-09 MED ORDER — FENTANYL CITRATE (PF) 100 MCG/2ML IJ SOLN
INTRAMUSCULAR | Status: AC
  Filled 2022-11-09: qty 2

## 2022-11-09 MED ORDER — AMIODARONE HCL 200 MG PO TABS
200.0000 mg | ORAL_TABLET | Freq: Every day | ORAL | Status: DC
  Administered 2022-11-09 – 2022-11-14 (×6): 200 mg via ORAL
  Filled 2022-11-09 (×6): qty 1

## 2022-11-09 MED ORDER — HEPARIN 6000 UNIT IRRIGATION SOLUTION
Status: AC
  Filled 2022-11-09: qty 500

## 2022-11-09 SURGICAL SUPPLY — 89 items
ADH SKN CLS APL DERMABOND .7 (GAUZE/BANDAGES/DRESSINGS) ×4
AGENT HMST KT MTR STRL THRMB (HEMOSTASIS) ×2
BAG COUNTER SPONGE SURGICOUNT (BAG) ×2 IMPLANT
BAG SPNG CNTER NS LX DISP (BAG) ×2
BLADE CLIPPER SURG (BLADE) ×2 IMPLANT
CANISTER SUCT 3000ML PPV (MISCELLANEOUS) ×2 IMPLANT
CATH BEACON 5.038 65CM KMP-01 (CATHETERS) ×2 IMPLANT
CATH EMB 5FR 80CM (CATHETERS) IMPLANT
CATH OMNI FLUSH .035X70CM (CATHETERS) ×2 IMPLANT
CATH SHOCKWAVE 5.5X60 (CATHETERS) IMPLANT
CATH SHOCKWAVE 7.0X60 (CATHETERS) IMPLANT
CATH VANSCH 5FR 6CM (CATHETERS) IMPLANT
CLIP TI MEDIUM 6 (CLIP) IMPLANT
CLIP TI WIDE RED SMALL 6 (CLIP) IMPLANT
DERMABOND ADVANCED .7 DNX12 (GAUZE/BANDAGES/DRESSINGS) ×2 IMPLANT
DEVICE CLOSURE PERCLS PRGLD 6F (VASCULAR PRODUCTS) ×8 IMPLANT
DEVICE TORQUE H2O (MISCELLANEOUS) IMPLANT
DRSG TEGADERM 2-3/8X2-3/4 SM (GAUZE/BANDAGES/DRESSINGS) ×4 IMPLANT
ELECT CAUTERY BLADE 6.4 (BLADE) ×2 IMPLANT
ELECT REM PT RETURN 9FT ADLT (ELECTROSURGICAL) ×4
ELECT SOLID GEL RDN PRO-PADZ (MISCELLANEOUS) ×2
ELECTRODE REM PT RTRN 9FT ADLT (ELECTROSURGICAL) ×4 IMPLANT
ELECTRODE SOLI GEL RDN PROPADZ (MISCELLANEOUS) IMPLANT
EXCLDR TRNK ENDO 26X14.5X12 16 (Endovascular Graft) ×2 IMPLANT
EXCLUDER TNK END 26X14.5X12 16 (Endovascular Graft) IMPLANT
GAUZE SPONGE 2X2 8PLY STRL LF (GAUZE/BANDAGES/DRESSINGS) ×4 IMPLANT
GLIDEWIRE ADV .035X180CM (WIRE) IMPLANT
GLOVE SURG SS PI 7.5 STRL IVOR (GLOVE) ×6 IMPLANT
GOWN STRL REUS W/ TWL LRG LVL3 (GOWN DISPOSABLE) ×4 IMPLANT
GOWN STRL REUS W/ TWL XL LVL3 (GOWN DISPOSABLE) ×4 IMPLANT
GOWN STRL REUS W/TWL LRG LVL3 (GOWN DISPOSABLE) ×4
GOWN STRL REUS W/TWL XL LVL3 (GOWN DISPOSABLE) ×4
GRAFT BALLN CATH 65CM (BALLOONS) ×2 IMPLANT
GUIDEWIRE ANGLED .035X150CM (WIRE) IMPLANT
HEMOSTAT SNOW SURGICEL 2X4 (HEMOSTASIS) IMPLANT
KIT BASIN OR (CUSTOM PROCEDURE TRAY) ×2 IMPLANT
KIT DRAIN CSF ACCUDRAIN (MISCELLANEOUS) IMPLANT
KIT ENCORE 26 ADVANTAGE (KITS) IMPLANT
KIT TURNOVER KIT B (KITS) ×2 IMPLANT
LEG CONTRALATERAL 16X20X13.5 (Vascular Products) ×2 IMPLANT
LEG CONTRALATERAL 20X11.5 (Vascular Products) ×2 IMPLANT
LOOP VASCLR MAXI BLUE 18IN ST (MISCELLANEOUS) IMPLANT
LOOP VASCULAR MAXI 18 BLUE (MISCELLANEOUS) ×6
LOOP VASCULAR MINI 18 RED (MISCELLANEOUS) ×6
LOOPS VASCLR MAXI BLUE 18IN ST (MISCELLANEOUS) ×6 IMPLANT
NS IRRIG 1000ML POUR BTL (IV SOLUTION) ×2 IMPLANT
PACK ENDOVASCULAR (PACKS) ×2 IMPLANT
PAD ARMBOARD 7.5X6 YLW CONV (MISCELLANEOUS) ×4 IMPLANT
PATCH VASC XENOSURE 1CMX6CM (Vascular Products) ×4 IMPLANT
PATCH VASC XENOSURE 1X6 (Vascular Products) IMPLANT
PENCIL BUTTON HOLSTER BLD 10FT (ELECTRODE) ×2 IMPLANT
PERCLOSE PROGLIDE 6F (VASCULAR PRODUCTS) ×8
SET MICROPUNCTURE 5F STIFF (MISCELLANEOUS) ×2 IMPLANT
SHEATH BRITE TIP 8FR 23CM (SHEATH) ×2 IMPLANT
SHEATH DRYSEAL FLEX 12FR 33CM (SHEATH) IMPLANT
SHEATH DRYSEAL FLEX 16FR 33CM (SHEATH) IMPLANT
SHEATH PINNACLE 8F 10CM (SHEATH) ×2 IMPLANT
STENT GRAFT CONTRALAT 20X11.5 (Vascular Products) IMPLANT
STENT GRAFT CONTRALAT 20X13.5 (Vascular Products) IMPLANT
STOPCOCK MORSE 400PSI 3WAY (MISCELLANEOUS) ×2 IMPLANT
SURGIFLO W/THROMBIN 8M KIT (HEMOSTASIS) IMPLANT
SUT MNCRL AB 4-0 PS2 18 (SUTURE) IMPLANT
SUT PROLENE 5 0 C 1 24 (SUTURE) IMPLANT
SUT PROLENE 5 0 C 1 36 (SUTURE) IMPLANT
SUT PROLENE 6 0 BV (SUTURE) IMPLANT
SUT SILK 2 0 (SUTURE) ×2
SUT SILK 2-0 18XBRD TIE 12 (SUTURE) IMPLANT
SUT SILK 3 0 (SUTURE) ×2
SUT SILK 3-0 18XBRD TIE 12 (SUTURE) IMPLANT
SUT SILK 4 0 (SUTURE) ×2
SUT SILK 4-0 18XBRD TIE 12 (SUTURE) IMPLANT
SUT VIC AB 2-0 CT1 27 (SUTURE) ×6
SUT VIC AB 2-0 CT1 TAPERPNT 27 (SUTURE) IMPLANT
SUT VIC AB 3-0 SH 27 (SUTURE) ×4
SUT VIC AB 3-0 SH 27X BRD (SUTURE) IMPLANT
SUT VIC AB 3-0 X1 27 (SUTURE) IMPLANT
SUT VICRYL 4-0 PS2 18IN ABS (SUTURE) IMPLANT
SYR 20ML LL LF (SYRINGE) ×2 IMPLANT
SYR BULB IRRIG 60ML STRL (SYRINGE) IMPLANT
TOWEL GREEN STERILE (TOWEL DISPOSABLE) ×2 IMPLANT
TRAY FOLEY MTR SLVR 16FR STAT (SET/KITS/TRAYS/PACK) ×2 IMPLANT
TUBING HIGH PRESSURE 120CM (CONNECTOR) ×2 IMPLANT
VASCULAR TIE MAXI BLUE 18IN ST (MISCELLANEOUS) ×6
VASCULAR TIE MINI RED 18IN STL (MISCELLANEOUS) IMPLANT
WIRE AMPLATZ SS-J .035X180CM (WIRE) ×4 IMPLANT
WIRE BENTSON .035X145CM (WIRE) ×4 IMPLANT
WIRE SPARTACORE .014X190CM (WIRE) IMPLANT
WIRE SPARTACORE .014X300CM (WIRE) IMPLANT
WIRE TORQFLEX AUST .018X40CM (WIRE) IMPLANT

## 2022-11-09 NOTE — Progress Notes (Signed)
[employer] contacted about procedure today. Orders for magnet were given on 11/01/2022. Representative restated magnet is appropriate for this procedure.

## 2022-11-09 NOTE — Anesthesia Procedure Notes (Signed)
Procedure Name: Intubation Date/Time: 11/09/2022 7:54 AM  Performed by: Marny Lowenstein, CRNAPre-anesthesia Checklist: Patient identified, Emergency Drugs available, Suction available and Patient being monitored Patient Re-evaluated:Patient Re-evaluated prior to induction Oxygen Delivery Method: Circle system utilized Preoxygenation: Pre-oxygenation with 100% oxygen Induction Type: IV induction Ventilation: Mask ventilation without difficulty Laryngoscope Size: Miller and 2 Grade View: Grade I Tube type: Oral Tube size: 7.5 mm Number of attempts: 1 Airway Equipment and Method: Stylet Placement Confirmation: ETT inserted through vocal cords under direct vision, positive ETCO2 and breath sounds checked- equal and bilateral Secured at: 20 cm Tube secured with: Tape Dental Injury: Teeth and Oropharynx as per pre-operative assessment

## 2022-11-09 NOTE — H&P (Signed)
 [facility] [place]  Patient name: Taylor [name] MRN: [id number] DOB: 10/05/1950 Sex: male   REASON FOR VISIT:    Follow up  HISOTRY OF PRESENT ILLNESS:    Taylor [name] is a 71 y.o. male who is a former patient of Dr. Fields who has been followed for abdominal aortic aneurysm.  Recently by ultrasound it had grown to larger than 5 cm and so he was sent for CT scan.  He denies any abdominal pain or back pain.  The patient is on aspirin and Coumadin for coronary issues, including aortic valve repair.  He has hypercholesterolemia but does not tolerate statins.  He has left-sided AICD.  He sees Dr. Bensimhon for his ischemic cardiomyopathy he is medically managed for hypertension.  He is seeing hematology for essential thrombocytosis   PAST MEDICAL HISTORY:   Past Medical History:  Diagnosis Date   Atherosclerotic heart disease of native coronary artery without angina pectoris    Chronic anticoagulation 02/18/2018   Chronic systolic heart failure (HCC) 02/02/2017   Essential thrombocythemia (HCC) 02/15/2017   Hyperlipidemia    Hypertension    Ischemic cardiomyopathy    Leg pain, central, left 04/17/2017   Hx sciatica - pain similar; no red flag signs on hx or exam    Presence of other heart-valve replacement    Ventricular tachycardia (HCC)      FAMILY HISTORY:   Family History  Problem Relation Age of Onset   Hypertension Father     SOCIAL HISTORY:   Social History   Tobacco Use   Smoking status: Former    Packs/day: 1.00    Years: 30.00    Additional pack years: 0.00    Total pack years: 30.00    Types: Cigarettes    Start date: 05/26/1979    Quit date: 05/18/2016    Years since quitting: 6.3    Passive exposure: Never   Smokeless tobacco: Never  Substance Use Topics   Alcohol use: Yes    Alcohol/week: 10.0 standard drinks of alcohol    Types: 10 Cans of beer per week    Comment: 1 can a day..      ALLERGIES:   Allergies  Allergen Reactions   Erythromycin Rash   Rosuvastatin Calcium     Muscle cramps   Simvastatin     Muscle cramps   Zithromax [Azithromycin] Rash     CURRENT MEDICATIONS:   Current Outpatient Medications  Medication Sig Dispense Refill   amiodarone (PACERONE) 200 MG tablet To replace Amiodarone 100 mg. Take 1/2 tablet (100 mg) PO in the morning and 1 tablets (200 mg) PO at night. 135 tablet 3   amoxicillin (AMOXIL) 500 MG capsule Take 4 capsules (2.000mg) one hour before dental procedures. 4 capsule 0   Ascorbic Acid ([employer]S VITAMIN C PO) Take 1 tablet by mouth daily. 60 mg Vit C and 15 mg Zinc ( combo)     aspirin 81 MG tablet Take 81 mg by mouth daily.     benzonatate (TESSALON) 100 MG capsule Take 100 mg by mouth 3 (three) times daily as needed for cough.     carvedilol (COREG) 3.125 MG tablet TAKE 1 TABLET(3.125 MG) BY MOUTH TWICE DAILY WITH A MEAL 180 tablet 3   dapagliflozin propanediol (FARXIGA) 10 MG TABS tablet Take 1 tablet (10 mg total) by mouth daily. 90 tablet 3   furosemide (LASIX) 20 MG tablet Take 1 tablet (20 mg total) by mouth daily. 30 tablet 6     hydroxyurea (HYDREA) 500 MG capsule Take 2 capsules (1,000 mg total) by mouth every Tuesday, Thursday, Saturday, and Sunday AND 3 capsules (1,500 mg total) every Monday, Wednesday, and Friday. May take with food to minimize GI side effects.. 75 capsule 2   ipratropium (ATROVENT) 0.06 % nasal spray Place 2 sprays into both nostrils 3 (three) times daily.     levothyroxine (SYNTHROID) 75 MCG tablet Take 75 mcg by mouth daily before breakfast.     potassium chloride SA (KLOR-CON M) 10 MEQ tablet Take 1 tablet (10 mEq total) by mouth daily. 30 tablet 6   spironolactone (ALDACTONE) 25 MG tablet TAKE 1/2 TABLET(12.5 MG) BY MOUTH DAILY 45 tablet 1   traZODone (DESYREL) 50 MG tablet Take by mouth.     vitamin B-12 (CYANOCOBALAMIN) 1000 MCG tablet Take 1,000 mcg by mouth daily.     Vitamin D,  Ergocalciferol, (DRISDOL) 1.25 MG (50000 UNIT) CAPS capsule Take 50,000 Units by mouth daily.     warfarin (COUMADIN) 5 MG tablet Take 0.5-1 tablets (2.5-5 mg total) by mouth See admin instructions. Take 1 tablet daily except 1/2 tablet on Mondays and Fridays 90 tablet 1   No current facility-administered medications for this visit.    REVIEW OF SYSTEMS:   [X] denotes positive finding, [ ] denotes negative finding Cardiac  Comments:  Chest pain or chest pressure:    Shortness of breath upon exertion:    Short of breath when lying flat:    Irregular heart rhythm:        Vascular    Pain in calf, thigh, or hip brought on by ambulation:    Pain in feet at night that wakes you up from your sleep:     Blood clot in your veins:    Leg swelling:         Pulmonary    Oxygen at home:    Productive cough:     Wheezing:         Neurologic    Sudden weakness in arms or legs:     Sudden numbness in arms or legs:     Sudden onset of difficulty speaking or slurred speech:    Temporary loss of vision in one eye:     Problems with dizziness:         Gastrointestinal    Blood in stool:     Vomited blood:         Genitourinary    Burning when urinating:     Blood in urine:        Psychiatric    Major depression:         Hematologic    Bleeding problems:    Problems with blood clotting too easily:        Skin    Rashes or ulcers:        Constitutional    Fever or chills:      PHYSICAL EXAM:   There were no vitals filed for this visit.  GENERAL: The patient is a well-nourished male, in no acute distress. The vital signs are documented above. CARDIAC: There is a regular rate and rhythm.  VASCULAR: Nonpalpable pedal pulses PULMONARY: Non-labored respirations ABDOMEN: Soft and non-tender  MUSCULOSKELETAL: There are no major deformities or cyanosis. NEUROLOGIC: No focal weakness or paresthesias are detected. SKIN: There are no ulcers or rashes noted. PSYCHIATRIC: The patient  has a normal affect.  STUDIES:   I have reviewed the following CTA: VASCULAR IMPRESSION:   1. Interval   increase in size of known infrarenal abdominal aortic aneurysm now measuring 5.1 cm, previously, 4.5 cm. No evidence of abdominal aortic dissection or perivascular stranding. If intervention is not pursued, recommend follow-up CTA of the abdomen and pelvis in 3-6 months as indicated. This recommendation follows ACR consensus guidelines: White Paper of the ACR Incidental Findings Committee II on Vascular Findings. J Am Coll Radiol 2013; 10:789-794. 2. 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 The IMA is diseased though remains patent with early collateral supply from the SMA. 3. Aneurysmal dilatation of the distal aspects of the bilateral common iliac arteries, the right measuring 1.8 cm and the left measuring 1.9 cm. 4. 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 5. Tandem areas of at least 50% luminal narrowing involving the bilateral pelvic arterial inflow and proximal outflow, suboptimally evaluated due to extensive mural calcifications.   NONVASCULAR IMPRESSION:   1. No acute findings within the abdomen or pelvis. 2. Stable presumed postoperative change in scarring involving the inferior pole of the left kidney.  MEDICAL ISSUES:   AAA:   The patient's aneurysm has enlarged to 5.1 cm.  We discussed proceeding with endovascular repair.  I discussed the risks and benefits of the operation including the risk of stroke, death, intestinal ischemia, renal insufficiency, lower extremity vascular issues.  He wishes to proceed.  Because of the amount of disease within his external iliac and common femoral arteries, he may require open femoral artery exposure and possible external iliac artery stenting.  This should also help with his claudication symptoms.  He wants to  get this done as soon as possible.   I will need to get clearance from his cardiology team before proceeding.  He is on Coumadin for his aortic valve.  This will need to be discontinued for surgery.  I would like to ask cardiology's assistance in appropriate management of this  I am getting baseline ABI and carotid duplex studies today.    Wells Allayah Raineri, IV, MD, FACS [facility] [place] Tel [phone] Pager [phone]  

## 2022-11-09 NOTE — Transfer of Care (Signed)
Immediate Anesthesia Transfer of Care Note  Patient: Taylor [name]  Procedure(s) Performed: ABDOMINAL AORTIC ENDOVASCULAR STENT GRAFT ULTRASOUND GUIDANCE FOR VASCULAR ACCESS (Bilateral: Groin) BILATERAL ENDARTERECTOMY FEMORAL (Bilateral: Groin) BILATERAL INTRAVASCULAR LITHOTRIPSY PROCEDURE (Bilateral: Groin) BILATERAL PATCH ANGIOPLASTY USING 1CM X 6CM XENOSURE PATCH (Bilateral: Groin)  Patient Location: PACU  Anesthesia Type:General  Level of Consciousness: awake, alert , and oriented  Airway & Oxygen Therapy: Patient Spontanous Breathing  Post-op Assessment: Report given to RN and Post -op Vital signs reviewed and stable  Post vital signs: Reviewed and stable  Last Vitals:  Vitals Value Taken Time  BP 125/54 11/09/22 1345  Temp    Pulse 58 11/09/22 1346  Resp 15 11/09/22 1346  SpO2 93 % 11/09/22 1346  Vitals shown include unvalidated device data.  Last Pain:  Vitals:   11/09/22 0646  TempSrc:   PainSc: 0-No pain         Complications: No notable events documented.

## 2022-11-09 NOTE — Anesthesia Procedure Notes (Signed)
Central Venous Catheter Insertion Performed by: Gaynelle Adu, MD, anesthesiologist Start/End6/13/2024 7:05 AM, 11/09/2022 7:20 AM Patient location: Pre-op. Preanesthetic checklist: patient identified, IV checked, site marked, risks and benefits discussed, surgical consent, monitors and equipment checked, pre-op evaluation, timeout performed and anesthesia consent Position: Trendelenburg Lidocaine 1% used for infiltration and patient sedated Hand hygiene performed , maximum sterile barriers used  and Seldinger technique used Catheter size: 8.5 Fr Total catheter length 10. Central line was placed.Sheath introducer Procedure performed using ultrasound guided technique. Ultrasound Notes:anatomy identified, needle tip was noted to be adjacent to the nerve/plexus identified, no ultrasound evidence of intravascular and/or intraneural injection and image(s) printed for medical record Attempts: 1 Following insertion, line sutured, dressing applied and Biopatch. Post procedure assessment: blood return through all ports, free fluid flow and no air  Patient tolerated the procedure well with no immediate complications. Additional procedure comments: 3 lumen "slick: catheter placed through the [facility] port.Marland Kitchen

## 2022-11-09 NOTE — Op Note (Signed)
Patient name: Recardo Linn MRN: [id number] DOB: [birth year] Sex: male  11/09/2022 Pre-operative Diagnosis: AAA Post-operative diagnosis:  Same Surgeon:  Durene Cal Assistants:  Adonis Housekeeper, PA Procedure:   #1:  Endovascular infrarenal aneurysm repair (40981)   #2:  Bilateral open femoral artery exposure (19147)    #3:  right femoral endarterectomy with bovine patch angioplasty   #4:  Left femoral endarterectomy with bovine patch angioplasty   #5:  Shockwave intra-arterial lithotripsy left external iliac, right common iliac, and right external iliac artery ([id number] x 3)   #6:  Radiology S&I codes Anesthesia:  General Blood Loss:  See anesthesia record Specimens:  none  Findings:  severe calcification in bilateral iliac arteries which required lithotripsy and angioplasty to advance the sheaths.  Bilateral femoral arteries required endarterectomy and bovine patch repair  Devices:  primary left GORE 26x14x12.  Ipsilateral left extension :20x12.  Contralateral right extension: 20x14  Indications: This is a 72 year old gentleman with heart failure who is on Coumadin for prior valve repair who comes in with a enlarging infrarenal abdominal aortic aneurysm.  He also has severe claudication with significant iliofemoral calcification.  Details of the operation and the risks and benefits were discussed with the patient and he wishes to proceed  Procedure:  The patient was identified in the holding area and taken to [facility] OR ROOM 16  The patient was then placed supine on the table. general anesthesia was administered.  The patient was prepped and draped in the usual sterile fashion.  A time out was called and antibiotics were administered.  A PA was necessary to expedite the procedure and assist with technical details.  She help with wire exchanges.  She help with suture following for patch angioplasty repair.  She help with wound closure.  Oblique incisions were made in each groin with a 10 blade.   Cautery was used divide subcutaneous tissue down to the femoral sheath which was opened sharply.  I exposed the common femoral artery bilaterally from the inguinal ligament to the bifurcation.  Bilateral superficial femoral and profundofemoral arteries were individually isolated.  Crossing circumflex iliac veins were divided under the inguinal ligament.  Circumflex iliac branches were individually isolated.  The femoral vessels were heavily calcified circumferentially.  The patient was then fully heparinized.  The right common femoral artery was cannulated with a micropuncture needle.  A 018 wire was inserted without resistance and a micropuncture sheath was placed.  I then inserted a Bentson wire followed by a 8 [place] sheath.  On the left side, I had difficulty getting the needle into the artery because of the calcification.  Ultimately I was able to get a micropuncture needle and wire into the iliac system followed by placement of a micropuncture sheath.  A contrast injection was then performed which showed severe iliac calcification and stenosis particularly in the external iliac artery.  I was ultimately able to navigate a Glidewire advantage into the descending thoracic aorta and placed an 8 [place] sheath.  Next, a Sparta core wire was inserted up the left side.  I selected a 5.5 shockwave lithotripsy balloon and performed angioplasty and lithotripsy of the left external iliac artery.  160 seconds of pulses were given with multiple balloon inflations.  I then replaced a Sparta core wire with a Amplatz Super Stiff wire and was able to get a 16 French sheath into the aorta.  Attention was turned towards the right iliac system.  A Sparta core wire was placed.  I  selected a 5.5 shockwave lithotripsy balloon and performed angioplasty and lithotripsy of the external iliac artery for right severe calcific disease and stenosis greater than 70%.  Under 20 seconds of pulses were delivered with multiple inflations.   I then switched out to a Amplatz Super Stiff wire and I tried to advance a 12 [place] sheath.  I could not get the sheath to go past the common iliac artery.  I then switched out again to the Sparta core wire and upsized to a 7 mm shockwave lithotripsy balloon and repeated treatment 420 seconds in the common iliac artery.  A Amplatz wire was then placed in this time with some difficulty I was able to advance a 12 French sheath into the aorta.  A Amplatz Super Stiff wire was then placed.  Next, the main body was prepared on the back table.  This was a Gore 26 x 14 x 12 device.  It was advanced up the left side and positioned in the abdominal aorta.  A pigtail catheter was advanced at the right side and an abdominal aortogram was performed.  Next the device was deployed landing just below the renal arteries down to the contralateral gate.  The contralateral gate was then cannulated with a Vanchee 5 catheter.  I switched out for a Omni Flush catheter which was able to be freely rotated within the main body of the device, confirming successful cannulation.  A Amplatz Super Stiff wire was then placed.  Next the image detector was rotated to a left anterior oblique position and a retrograde injection was performed to the sheath in the right groin locating the right hypogastric artery.  The 12 French sheath was then advanced into the gate.  A 20 x 14 device was then inserted.  This was deployed landing at the level of the hypogastric artery and the common iliac artery.  Next, the remaining portion of the ipsilateral limb was deployed.  The image detector was rotated to a right anterior oblique position and a retrograde injection performed through the sheath in the left groin locating the left hypogastric artery.  A 20 x 12 device was then inserted and deployed landing just proximal to the hypogastric artery.  Next, a MOB balloon was used to mold the proximal and distal attachment sites as well as device overlap.  A  completion angiogram was then performed which showed successful exclusion of the aneurysm with preservation of patency of the bilateral renal arteries as well as bilateral hypogastric arteries.  I then performed retrograde injections through the sheaths in each groin that showed that the iliac arteries remain disease but were patent without any hemodynamically significant stenoses.  I elected not to stent the external iliac arteries.  There was good pulsatile flow through the sheaths.  Attention was then turned towards the right groin.  I removed the wires and sheaths and occluded the superficial femoral and profundofemoral arteries with baby Gregory clamps.  I had to place 2 Henley clamps to occlude the inflow.  I then performed endarterectomy of the common femoral artery down to the bifurcation.  I did not extend the endarterectomy into the superficial femoral or profundofemoral artery because there was no end to the calcific disease and there did appear to be a visualized patent lumen in each vessel.  I inserted a 5 Fogarty balloon into the external iliac artery to remove is much proximal plaque as I could.  I made sure there was good inflow.  Patch angioplasty was then performed  with bovine pericardial patch and running 5-0 Prolene.  Prior to completion the appropriate flushing maneuvers were performed and the anastomosis was completed.  Blood flow was then reestablished to the right leg.  Several repair stitches were required for hemostasis.  I did have to place a pledgeted stitch in the profundofemoral artery to control bleeding.  There were Doppler signals present in the profunda and superficial femoral artery.  The right groin was then packed with a lap pad and attention was turned towards the left side.  The superficial femoral, profundofemoral and common femoral artery were occluded with vascular clamps.  I had to place to clamps proximally to get hemostasis.  Endarterectomy was performed with a  Ladoris Gene.  This was done down to the bifurcation.  There was good backbleeding from the profunda and superficial femoral artery and good inflow.  I then selected a bovine pericardial patch and performed patch angioplasty with a running 5-0 Prolene.  Prior to completion, the appropriate flushing maneuvers were performed and the anastomosis was completed.  There were good profunda and superficial femoral artery Doppler signals.  Next, the patient's heparin was reversed with 50 mg of protamine.  Multiple topical agents were utilized for hemostasis.  The groin was copiously irrigated.  Once I was satisfied with hemostasis, the femoral sheaths were reapproximated with 2-0 Vicryl.  The subcutaneous tissue was closed with additional layers of 3-0 Vicryl followed by subcuticular closure.  We checked to make sure that the patient had Doppler signals in bilateral pedal vessels.  He was then successfully extubated and taken recovery in stable condition.   Disposition: To PACU stable.   Juleen China, M.D., [facility] [place] Office: [phone] Pager:  [phone]

## 2022-11-09 NOTE — Anesthesia Procedure Notes (Signed)
Arterial Line Insertion Start/End6/13/2024 7:10 AM, 11/09/2022 7:15 AM Performed by: Gaynelle Adu, MD, Marny Lowenstein, CRNA, CRNA  Patient location: Pre-op. Preanesthetic checklist: patient identified, IV checked, site marked, risks and benefits discussed, surgical consent, monitors and equipment checked, pre-op evaluation, timeout performed and anesthesia consent Lidocaine 1% used for infiltration and patient sedated Right, radial was placed Catheter size: 20 G Hand hygiene performed , maximum sterile barriers used  and Seldinger technique used Allen's test indicative of satisfactory collateral circulation Attempts: 1 Procedure performed without using ultrasound guided technique. Following insertion, dressing applied and Biopatch. Post procedure assessment: unchanged  Patient tolerated the procedure well with no immediate complications.

## 2022-11-09 NOTE — Anesthesia Postprocedure Evaluation (Signed)
Anesthesia Post Note  Patient: Taylor [name]  Procedure(s) Performed: ABDOMINAL AORTIC ENDOVASCULAR STENT GRAFT ULTRASOUND GUIDANCE FOR VASCULAR ACCESS (Bilateral: Groin) BILATERAL ENDARTERECTOMY FEMORAL (Bilateral: Groin) BILATERAL INTRAVASCULAR LITHOTRIPSY PROCEDURE (Bilateral: Groin) BILATERAL PATCH ANGIOPLASTY USING 1CM X 6CM XENOSURE PATCH (Bilateral: Groin)     Patient location during evaluation: PACU Anesthesia Type: General Level of consciousness: awake and alert Pain management: pain level controlled Vital Signs Assessment: post-procedure vital signs reviewed and stable Respiratory status: spontaneous breathing, nonlabored ventilation and respiratory function stable Cardiovascular status: blood pressure returned to baseline and stable Postop Assessment: no apparent nausea or vomiting Anesthetic complications: no  No notable events documented.  Last Vitals:  Vitals:   11/09/22 1415 11/09/22 1430  BP: 134/63   Pulse: (!) 57 (!) 58  Resp: 15   Temp:    SpO2: 95%     Last Pain:  Vitals:   11/09/22 1330  TempSrc:   PainSc: 0-No pain                 Alaze Garverick,W. EDMOND

## 2022-11-09 NOTE — Discharge Instructions (Addendum)
[facility]   Discharge Instructions  Endovascular Aortic Aneurysm Repair  Please refer to the following instructions for your post-procedure care. Your surgeon or Physician Assistant will discuss any changes with you.  Activity  You are encouraged to walk as much as you can. You can slowly return to normal activities but must avoid strenuous activity and heavy lifting until your doctor tells you it's OK. Avoid activities such as vacuuming or swinging a gold club. It is normal to feel tired for several weeks after your surgery. Do not drive until your doctor gives the OK and you are no longer taking prescription pain medications. It is also normal to have difficulty with sleep habits, eating, and bowel movements after surgery. These will go away with time.  Bathing/Showering  Shower daily after you go home.  Do not soak in a bathtub, hot tub, or swim until the incision heals completely.  If you have incisions in your groin, wash the groin wounds with soap and water daily and pat dry. (No tub bath-only shower)  Then put a dry gauze or washcloth there to keep this area dry to help prevent wound infection daily and as needed.  Do not use Vaseline or neosporin on your incisions.  Only use soap and water on your incisions and then protect and keep dry.  Incision Care  Wash the groin wound with soap and water daily and pat dry. (No tub bath-only shower)  Then put a dry gauze or washcloth there to keep this area dry daily and as needed.  Do not use Vaseline or neosporin on your incisions.  Only use soap and water on your incisions and then protect and keep dry.   Diet  Resume your normal diet. There are no special food restrictions following this procedure. A low fat/low cholesterol diet is recommended for all patients with vascular disease. In order to heal from your surgery, it is CRITICAL to get adequate nutrition. Your body requires vitamins, minerals, and  protein. Vegetables are the best source of vitamins and minerals. Vegetables also provide the perfect balance of protein. Processed food has little nutritional value, so try to avoid this.  Medications  Resume taking all of your medications unless your doctor or nurse practitioner tells you not to. If your incision is causing pain, you may take over-the-counter pain relievers such as acetaminophen (Tylenol). If you were prescribed a stronger pain medication, please be aware these medications can cause nausea and constipation. Prevent nausea by taking the medication with a snack or meal. Avoid constipation by drinking plenty of fluids and eating foods with a high amount of fiber, such as fruits, vegetables, and grains.   Do not take Tylenol if you are taking prescription pain medications.  Next dose of Lovenox will be 8pm on 11/14/2022.  Take twice daily at 8am and 8pm until instructed otherwise by the coumadin clinic.   Follow up  Our office will schedule a follow-up appointment with a CT scan 3-4 weeks after your surgery.  Please call us immediately for any of the following conditions  Severe or worsening pain in your legs or feet or in your abdomen back or chest. Increased pain, redness, drainage (pus) from your incision site. Increased abdominal pain, bloating, nausea, vomiting or persistent diarrhea. Fever of 101 degrees or higher. Swelling in your leg (s),  Reduce your risk of vascular disease  Stop smoking. If you would like help call [facility] at 1-800-QUIT-NOW (567 404 7939) or [facility] at  [phone]. Manage your cholesterol Maintain a desired weight Control your diabetes Keep your blood pressure down  If you have questions, please call the office at [phone].  Information on my medicine - Coumadin   (Warfarin)  This medication education was reviewed with me or my healthcare representative as part of my discharge preparation.  The pharmacist that spoke with me  during my hospital stay was:    Why was Coumadin prescribed for you? Coumadin was prescribed for you because you have a blood clot or a medical condition that can cause an increased risk of forming blood clots. Blood clots can cause serious health problems by blocking the flow of blood to the heart, lung, or brain. Coumadin can prevent harmful blood clots from forming. As a reminder your indication for Coumadin is:  Blood Clot Prevention after Heart Valve Surgery  What test will check on my response to Coumadin? While on Coumadin (warfarin) you will need to have an INR test regularly to ensure that your dose is keeping you in the desired range. The INR (international normalized ratio) number is calculated from the result of the laboratory test called prothrombin time (PT).  If an INR APPOINTMENT HAS NOT ALREADY BEEN MADE FOR YOU please schedule an appointment to have this lab work done by your health care provider within 7 days. Your INR goal is usually a number between:  2 to 3 or your provider may give you a more narrow range like 2-2.5.  Ask your health care provider during an office visit what your goal INR is.  What  do you need to  know  About  COUMADIN? Take Coumadin (warfarin) exactly as prescribed by your healthcare provider about the same time each day.  DO NOT stop taking without talking to the doctor who prescribed the medication.  Stopping without other blood clot prevention medication to take the place of Coumadin may increase your risk of developing a new clot or stroke.  Get refills before you run out.  What do you do if you miss a dose? If you miss a dose, take it as soon as you remember on the same day then continue your regularly scheduled regimen the next day.  Do not take two doses of Coumadin at the same time.  Important Safety Information A possible side effect of Coumadin (Warfarin) is an increased risk of bleeding. You should call your healthcare provider right away if you  experience any of the following: Bleeding from an injury or your nose that does not stop. Unusual colored urine (red or dark brown) or unusual colored stools (red or black). Unusual bruising for unknown reasons. A serious fall or if you hit your head (even if there is no bleeding).  Some foods or medicines interact with Coumadin (warfarin) and might alter your response to warfarin. To help avoid this: Eat a balanced diet, maintaining a consistent amount of Vitamin K. Notify your provider about major diet changes you plan to make. Avoid alcohol or limit your intake to 1 drink for women and 2 drinks for men per day. (1 drink is 5 oz. wine, 12 oz. beer, or 1.5 oz. liquor.)  Make sure that ANY health care provider who prescribes medication for you knows that you are taking Coumadin (warfarin).  Also make sure the healthcare provider who is monitoring your Coumadin knows when you have started a new medication including herbals and non-prescription products.  Coumadin (Warfarin)  Major Drug Interactions  Increased Warfarin Effect Decreased Warfarin Effect  Alcohol (large quantities) Antibiotics (esp. Septra/Bactrim, Flagyl, Cipro) Amiodarone (Cordarone) Aspirin (ASA) Cimetidine (Tagamet) Megestrol (Megace) NSAIDs (ibuprofen, naproxen, etc.) Piroxicam (Feldene) Propafenone (Rythmol SR) Propranolol (Inderal) Isoniazid (INH) Posaconazole (Noxafil) Barbiturates (Phenobarbital) Carbamazepine (Tegretol) Chlordiazepoxide (Librium) Cholestyramine (Questran) Griseofulvin Oral Contraceptives Rifampin Sucralfate (Carafate) Vitamin K   Coumadin (Warfarin) Major Herbal Interactions  Increased Warfarin Effect Decreased Warfarin Effect  Garlic Ginseng Ginkgo biloba Coenzyme Q10 Green tea St. John's wort    Coumadin (Warfarin) FOOD Interactions  Eat a consistent number of servings per week of foods HIGH in Vitamin K (1 serving =  cup)  Collards (cooked, or boiled & drained) Kale  (cooked, or boiled & drained) Mustard greens (cooked, or boiled & drained) Parsley *serving size only =  cup Spinach (cooked, or boiled & drained) Swiss chard (cooked, or boiled & drained) Turnip greens (cooked, or boiled & drained)  Eat a consistent number of servings per week of foods MEDIUM-HIGH in Vitamin K (1 serving = 1 cup)  Asparagus (cooked, or boiled & drained) Broccoli (cooked, boiled & drained, or raw & chopped) Brussel sprouts (cooked, or boiled & drained) *serving size only =  cup Lettuce, raw (green leaf, endive, romaine) Spinach, raw Turnip greens, raw & chopped   These websites have more information on Coumadin (warfarin):  http://www.king-russell.com/; https://www.hines.net/;

## 2022-11-10 ENCOUNTER — Inpatient Hospital Stay (HOSPITAL_COMMUNITY): Payer: Medicare Other | Admitting: Anesthesiology

## 2022-11-10 ENCOUNTER — Encounter (HOSPITAL_COMMUNITY): Payer: Medicare Other

## 2022-11-10 ENCOUNTER — Encounter (HOSPITAL_COMMUNITY)
Admission: RE | Disposition: A | Discharge status: Home Health | Payer: Self-pay | Source: Home / Self Care | Attending: Surgery

## 2022-11-10 ENCOUNTER — Encounter (HOSPITAL_COMMUNITY): Payer: Self-pay | Admitting: Surgery

## 2022-11-10 ENCOUNTER — Inpatient Hospital Stay (HOSPITAL_COMMUNITY): Payer: Medicare Other

## 2022-11-10 ENCOUNTER — Other Ambulatory Visit (HOSPITAL_COMMUNITY): Payer: Self-pay

## 2022-11-10 DIAGNOSIS — R52 Pain, unspecified: Secondary | ICD-10-CM

## 2022-11-10 DIAGNOSIS — I9789 Other postprocedural complications and disorders of the circulatory system, not elsewhere classified: Secondary | ICD-10-CM

## 2022-11-10 DIAGNOSIS — I509 Heart failure, unspecified: Secondary | ICD-10-CM

## 2022-11-10 DIAGNOSIS — I2510 Atherosclerotic heart disease of native coronary artery without angina pectoris: Secondary | ICD-10-CM

## 2022-11-10 DIAGNOSIS — I110 Hypertensive heart disease with heart failure: Secondary | ICD-10-CM

## 2022-11-10 DIAGNOSIS — Z87891 Personal history of nicotine dependence: Secondary | ICD-10-CM

## 2022-11-10 HISTORY — PX: THROMBECTOMY FEMORAL ARTERY: SHX6406

## 2022-11-10 HISTORY — PX: ENDARTERECTOMY FEMORAL: SHX5804

## 2022-11-10 HISTORY — PX: VEIN HARVEST: SHX6363

## 2022-11-10 HISTORY — PX: PATCH ANGIOPLASTY: SHX6230

## 2022-11-10 LAB — CBC
HCT: 27.2 % — ABNORMAL LOW (ref 39.0–52.0)
Hemoglobin: 9.4 g/dL — ABNORMAL LOW (ref 13.0–17.0)
MCH: 40.9 pg — ABNORMAL HIGH (ref 26.0–34.0)
MCHC: 34.6 g/dL (ref 30.0–36.0)
MCV: 118.3 fL — ABNORMAL HIGH (ref 80.0–100.0)
Platelets: 261 10*3/uL (ref 150–400)
RBC: 2.3 MIL/uL — ABNORMAL LOW (ref 4.22–5.81)
RDW: 24.8 % — ABNORMAL HIGH (ref 11.5–15.5)
WBC: 3.5 10*3/uL — ABNORMAL LOW (ref 4.0–10.5)
nRBC: 0 % (ref 0.0–0.2)

## 2022-11-10 LAB — TYPE AND SCREEN
Unit division: 0
Unit division: 0
Unit division: 0

## 2022-11-10 LAB — PROTIME-INR
INR: 1.3 — ABNORMAL HIGH (ref 0.8–1.2)
Prothrombin Time: 16.1 seconds — ABNORMAL HIGH (ref 11.4–15.2)

## 2022-11-10 LAB — BPAM RBC
Blood Product Expiration Date: 202406212359
Blood Product Expiration Date: 202407042359
Blood Product Expiration Date: 202407042359
Blood Product Expiration Date: 202407042359
Blood Product Expiration Date: 202407052359
ISSUE DATE / TIME: 202406131148
Unit Type and Rh: 6200
Unit Type and Rh: 6200

## 2022-11-10 LAB — BASIC METABOLIC PANEL
Anion gap: 7 (ref 5–15)
BUN: 17 mg/dL (ref 8–23)
CO2: 24 mmol/L (ref 22–32)
Calcium: 8.4 mg/dL — ABNORMAL LOW (ref 8.9–10.3)
Chloride: 103 mmol/L (ref 98–111)
Creatinine, Ser: 1.27 mg/dL — ABNORMAL HIGH (ref 0.61–1.24)
GFR, Estimated: >60 mL/min (ref >60)
Glucose, Bld: 133 mg/dL — ABNORMAL HIGH (ref 70–99)
Potassium: 4.5 mmol/L (ref 3.5–5.1)
Sodium: 134 mmol/L — ABNORMAL LOW (ref 135–145)

## 2022-11-10 LAB — PREPARE RBC (CROSSMATCH)

## 2022-11-10 LAB — GLUCOSE, CAPILLARY: Glucose-Capillary: 131 mg/dL — ABNORMAL HIGH (ref 70–99)

## 2022-11-10 LAB — MAGNESIUM: Magnesium: 1.9 mg/dL (ref 1.7–2.4)

## 2022-11-10 SURGERY — THROMBECTOMY, ARTERY, FEMORAL
Anesthesia: General | Site: Leg Upper | Laterality: Left

## 2022-11-10 MED ORDER — ORAL CARE MOUTH RINSE: 15.0000 mL | Freq: Once | OROMUCOSAL | Status: DC

## 2022-11-10 MED ORDER — HEPARIN (PORCINE) 25000 UT/250ML-% IV SOLN
INTRAVENOUS | Status: AC
  Administered 2022-11-10: 1150 [IU]/h via INTRAVENOUS
  Filled 2022-11-10: qty 250

## 2022-11-10 MED ORDER — SODIUM CHLORIDE 0.9 % IV SOLN: INTRAVENOUS | Status: DC | PRN

## 2022-11-10 MED ORDER — CEFAZOLIN SODIUM-DEXTROSE 2-4 GM/100ML-% IV SOLN
INTRAVENOUS | Status: AC
  Filled 2022-11-10: qty 100

## 2022-11-10 MED ORDER — HEPARIN 6000 UNIT IRRIGATION SOLUTION
Status: AC
  Filled 2022-11-10: qty 500

## 2022-11-10 MED ORDER — PROTAMINE SULFATE 10 MG/ML IV SOLN
INTRAVENOUS | Status: DC | PRN
  Administered 2022-11-10: 10 mg via INTRAVENOUS
  Administered 2022-11-10: 40 mg via INTRAVENOUS

## 2022-11-10 MED ORDER — 0.9 % SODIUM CHLORIDE (POUR BTL) OPTIME
TOPICAL | Status: DC | PRN
  Administered 2022-11-10: 1000 mL

## 2022-11-10 MED ORDER — CEFAZOLIN SODIUM-DEXTROSE 2-4 GM/100ML-% IV SOLN
2.0000 g | Freq: Three times a day (TID) | INTRAVENOUS | Status: AC
  Administered 2022-11-10 – 2022-11-11 (×2): 2 g via INTRAVENOUS
  Filled 2022-11-10 (×2): qty 100

## 2022-11-10 MED ORDER — HEPARIN BOLUS VIA INFUSION
4000.0000 [IU] | Freq: Once | INTRAVENOUS | Status: AC
  Administered 2022-11-10: 4000 [IU] via INTRAVENOUS
  Filled 2022-11-10: qty 4000

## 2022-11-10 MED ORDER — DEXAMETHASONE SODIUM PHOSPHATE 10 MG/ML IJ SOLN
INTRAMUSCULAR | Status: DC | PRN
  Administered 2022-11-10: 5 mg via INTRAVENOUS

## 2022-11-10 MED ORDER — HEPARIN (PORCINE) 25000 UT/250ML-% IV SOLN
1350.0000 [IU]/h | INTRAVENOUS | Status: DC
  Administered 2022-11-10: 1150 [IU]/h via INTRAVENOUS
  Administered 2022-11-11: 950 [IU]/h via INTRAVENOUS
  Administered 2022-11-12: 1200 [IU]/h via INTRAVENOUS
  Administered 2022-11-13: 1350 [IU]/h via INTRAVENOUS
  Filled 2022-11-10 (×4): qty 250

## 2022-11-10 MED ORDER — FENTANYL CITRATE (PF) 250 MCG/5ML IJ SOLN
INTRAMUSCULAR | Status: AC
  Filled 2022-11-10: qty 5

## 2022-11-10 MED ORDER — SODIUM CHLORIDE 0.9 % IV SOLN: INTRAVENOUS | Status: AC

## 2022-11-10 MED ORDER — SODIUM CHLORIDE 0.9 % IV SOLN: 500.0000 mL | Freq: Once | INTRAVENOUS | Status: DC | PRN

## 2022-11-10 MED ORDER — SODIUM CHLORIDE 0.9% IV SOLUTION: Freq: Once | INTRAVENOUS | Status: DC

## 2022-11-10 MED ORDER — FENTANYL CITRATE (PF) 250 MCG/5ML IJ SOLN
INTRAMUSCULAR | Status: DC | PRN
  Administered 2022-11-10: 100 ug via INTRAVENOUS

## 2022-11-10 MED ORDER — CEFAZOLIN SODIUM-DEXTROSE 2-4 GM/100ML-% IV SOLN
2.0000 g | Freq: Once | INTRAVENOUS | Status: AC
  Administered 2022-11-10: 2 g via INTRAVENOUS

## 2022-11-10 MED ORDER — CHLORHEXIDINE GLUCONATE 0.12 % MT SOLN
OROMUCOSAL | Status: AC
  Filled 2022-11-10: qty 15

## 2022-11-10 MED ORDER — ONDANSETRON HCL 4 MG/2ML IJ SOLN
INTRAMUSCULAR | Status: DC | PRN
  Administered 2022-11-10: 4 mg via INTRAVENOUS

## 2022-11-10 MED ORDER — HEMOSTATIC AGENTS (NO CHARGE) OPTIME
TOPICAL | Status: DC | PRN
  Administered 2022-11-10: 1 via TOPICAL

## 2022-11-10 MED ORDER — OXYCODONE-ACETAMINOPHEN 5-325 MG PO TABS
1.0000 | ORAL_TABLET | Freq: Four times a day (QID) | ORAL | 0 refills | Status: DC | PRN
  Filled 2022-11-10: qty 30, 8d supply, fill #0

## 2022-11-10 MED ORDER — HEPARIN SODIUM (PORCINE) 1000 UNIT/ML IJ SOLN
INTRAMUSCULAR | Status: DC | PRN
  Administered 2022-11-10: 3000 [IU] via INTRAVENOUS
  Administered 2022-11-10: 7000 [IU] via INTRAVENOUS

## 2022-11-10 MED ORDER — FENTANYL CITRATE (PF) 100 MCG/2ML IJ SOLN: 25.0000 ug | INTRAMUSCULAR | Status: DC | PRN

## 2022-11-10 MED ORDER — ROCURONIUM BROMIDE 10 MG/ML (PF) SYRINGE
PREFILLED_SYRINGE | INTRAVENOUS | Status: DC | PRN
  Administered 2022-11-10: 20 mg via INTRAVENOUS
  Administered 2022-11-10: 50 mg via INTRAVENOUS
  Administered 2022-11-10: 20 mg via INTRAVENOUS

## 2022-11-10 MED ORDER — NOREPINEPHRINE 4 MG/250ML-% IV SOLN
INTRAVENOUS | Status: DC | PRN
  Administered 2022-11-10: 2 ug/min via INTRAVENOUS

## 2022-11-10 MED ORDER — LACTATED RINGERS IV SOLN: INTRAVENOUS | Status: DC | PRN

## 2022-11-10 MED ORDER — SUGAMMADEX SODIUM 200 MG/2ML IV SOLN
INTRAVENOUS | Status: DC | PRN
  Administered 2022-11-10: 200 mg via INTRAVENOUS

## 2022-11-10 MED ORDER — LIDOCAINE 2% (20 MG/ML) 5 ML SYRINGE
INTRAMUSCULAR | Status: DC | PRN
  Administered 2022-11-10: 60 mg via INTRAVENOUS

## 2022-11-10 MED ORDER — CHLORHEXIDINE GLUCONATE 0.12 % MT SOLN: 15.0000 mL | Freq: Once | OROMUCOSAL | Status: DC

## 2022-11-10 MED ORDER — ETOMIDATE 2 MG/ML IV SOLN
INTRAVENOUS | Status: DC | PRN
  Administered 2022-11-10: 20 mg via INTRAVENOUS

## 2022-11-10 MED ORDER — ONDANSETRON HCL 4 MG/2ML IJ SOLN: 4.0000 mg | Freq: Four times a day (QID) | INTRAMUSCULAR | Status: DC | PRN

## 2022-11-10 MED ORDER — HEPARIN 6000 UNIT IRRIGATION SOLUTION
Status: DC | PRN
  Administered 2022-11-10: 1

## 2022-11-10 MED ORDER — OXYCODONE HCL 5 MG PO TABS: 5.0000 mg | ORAL_TABLET | Freq: Once | ORAL | Status: DC | PRN

## 2022-11-10 MED ORDER — OXYCODONE HCL 5 MG/5ML PO SOLN: 5.0000 mg | Freq: Once | ORAL | Status: DC | PRN

## 2022-11-10 MED ORDER — LACTATED RINGERS IV SOLN: INTRAVENOUS | Status: DC

## 2022-11-10 SURGICAL SUPPLY — 60 items
ADH SKN CLS APL DERMABOND .7 (GAUZE/BANDAGES/DRESSINGS) ×3
APL SKNCLS STERI-STRIP NONHPOA (GAUZE/BANDAGES/DRESSINGS) ×3
BAG COUNTER SPONGE SURGICOUNT (BAG) ×3 IMPLANT
BAG ISL DRAPE 18X18 STRL (DRAPES) ×3
BAG ISOLATION DRAPE 18X18 (DRAPES) IMPLANT
BAG SPNG CNTER NS LX DISP (BAG) ×3
BANDAGE ESMARK 6X9 LF (GAUZE/BANDAGES/DRESSINGS) IMPLANT
BENZOIN TINCTURE PRP APPL 2/3 (GAUZE/BANDAGES/DRESSINGS) ×3 IMPLANT
BNDG CMPR 9X6 STRL LF SNTH (GAUZE/BANDAGES/DRESSINGS)
BNDG ELASTIC 4X5.8 VLCR STR LF (GAUZE/BANDAGES/DRESSINGS) IMPLANT
BNDG ESMARK 6X9 LF (GAUZE/BANDAGES/DRESSINGS)
CANISTER SUCT 3000ML PPV (MISCELLANEOUS) ×3 IMPLANT
CATH EMB 4FR 40 (CATHETERS) IMPLANT
COVER PROBE W GEL 5X96 (DRAPES) IMPLANT
CUFF TOURN SGL QUICK 24 (TOURNIQUET CUFF)
CUFF TOURN SGL QUICK 34 (TOURNIQUET CUFF)
CUFF TOURN SGL QUICK 42 (TOURNIQUET CUFF) IMPLANT
CUFF TRNQT CYL 24X4X16.5-23 (TOURNIQUET CUFF) IMPLANT
CUFF TRNQT CYL 34X4.125X (TOURNIQUET CUFF) IMPLANT
DERMABOND ADVANCED .7 DNX12 (GAUZE/BANDAGES/DRESSINGS) IMPLANT
DRAIN CHANNEL 15F RND FF W/TCR (WOUND CARE) IMPLANT
DRAPE X-RAY CASS 24X20 (DRAPES) IMPLANT
ELECT REM PT RETURN 9FT ADLT (ELECTROSURGICAL) ×3
ELECTRODE REM PT RTRN 9FT ADLT (ELECTROSURGICAL) ×3 IMPLANT
EVACUATOR SILICONE 100CC (DRAIN) IMPLANT
GLOVE BIOGEL PI IND STRL 6.5 (GLOVE) IMPLANT
GLOVE BIOGEL PI IND STRL 7.5 (GLOVE) ×3 IMPLANT
GLOVE SURG SS PI 7.5 STRL IVOR (GLOVE) ×3 IMPLANT
GOWN STRL REUS W/ TWL LRG LVL3 (GOWN DISPOSABLE) ×6 IMPLANT
GOWN STRL REUS W/ TWL XL LVL3 (GOWN DISPOSABLE) ×3 IMPLANT
GOWN STRL REUS W/TWL LRG LVL3 (GOWN DISPOSABLE) ×6
GOWN STRL REUS W/TWL XL LVL3 (GOWN DISPOSABLE) ×3
HEMOSTAT SNOW SURGICEL 2X4 (HEMOSTASIS) IMPLANT
KIT BASIN OR (CUSTOM PROCEDURE TRAY) ×3 IMPLANT
KIT TURNOVER KIT B (KITS) ×3 IMPLANT
MARKER GRAFT CORONARY BYPASS (MISCELLANEOUS) IMPLANT
NS IRRIG 1000ML POUR BTL (IV SOLUTION) ×6 IMPLANT
PACK PERIPHERAL VASCULAR (CUSTOM PROCEDURE TRAY) ×3 IMPLANT
PAD ARMBOARD 7.5X6 YLW CONV (MISCELLANEOUS) ×6 IMPLANT
SET COLLECT BLD 21X3/4 12 (NEEDLE) IMPLANT
STOPCOCK 4 WAY LG BORE MALE ST (IV SETS) IMPLANT
STRIP CLOSURE SKIN 1/2X4 (GAUZE/BANDAGES/DRESSINGS) ×6 IMPLANT
SUT ETHILON 3 0 PS 1 (SUTURE) IMPLANT
SUT MNCRL AB 4-0 PS2 18 (SUTURE) IMPLANT
SUT PROLENE 5 0 C 1 24 (SUTURE) ×3 IMPLANT
SUT PROLENE 6 0 BV (SUTURE) ×3 IMPLANT
SUT PROLENE 7 0 BV 1 (SUTURE) IMPLANT
SUT SILK 2 0 SH (SUTURE) ×3 IMPLANT
SUT SILK 3 0 (SUTURE)
SUT SILK 3-0 18XBRD TIE 12 (SUTURE) IMPLANT
SUT VIC AB 2-0 CT1 27 (SUTURE) ×6
SUT VIC AB 2-0 CT1 TAPERPNT 27 (SUTURE) ×6 IMPLANT
SUT VIC AB 3-0 SH 27 (SUTURE) ×3
SUT VIC AB 3-0 SH 27X BRD (SUTURE) ×6 IMPLANT
SUT VICRYL 4-0 PS2 18IN ABS (SUTURE) ×6 IMPLANT
TOWEL GREEN STERILE (TOWEL DISPOSABLE) ×3 IMPLANT
TRAY FOLEY MTR SLVR 16FR STAT (SET/KITS/TRAYS/PACK) ×3 IMPLANT
TUBING EXTENTION W/L.L. (IV SETS) IMPLANT
UNDERPAD 30X36 HEAVY ABSORB (UNDERPADS AND DIAPERS) ×3 IMPLANT
WATER STERILE IRR 1000ML POUR (IV SOLUTION) ×3 IMPLANT

## 2022-11-10 NOTE — Progress Notes (Addendum)
[facility] [place]  Subjective  - Doing well   Objective 124/60 66 98.2 F (36.8 C) (Oral) (!) 23 95%  Intake/Output Summary (Last 24 hours) at 11/10/2022 0720 Last data filed at 11/10/2022 0600 Gross per 24 hour  Intake 4475.52 ml  Output 3210 ml  Net 1265.52 ml    Doppler signals PT B weak DP right LE Groin incisions healing well without evidence of hematoma Abdomin soft, tolerated supper without N/V   Assessment/Planning: POD # 1 EVAR  He has PT doppler B No N/V with PO He will need to void independently prior to discharge  D/C He has not needed BP pressors since last night BP 104-152 systolic He will wean back onto his Coumadin with Lovenox bridging that he has at home. He will be discharged home later today.     Mosetta Pigeon 11/10/2022 7:20 AM --  Laboratory Lab Results: Recent Labs    11/09/22 1330 11/10/22 0215  WBC 2.5* 3.5*  HGB 10.4* 9.4*  HCT 30.1* 27.2*  PLT 316 261   BMET Recent Labs    11/09/22 1330 11/10/22 0215  NA 136 134*  K 4.5 4.5  CL 104 103  CO2 23 24  GLUCOSE 154* 133*  BUN 19 17  CREATININE 1.05 1.27*  CALCIUM 7.9* 8.4*    COAG Lab Results  Component Value Date   INR 1.3 (H) 11/10/2022   INR 1.4 (H) 11/09/2022   INR 1.2 11/09/2022   No results found for: "PTT"   I have seen and evaluated the patient. I agree with the PA note as documented above.  Postop day 1 status post EVAR with bilateral femoral endarterectomies.  Wounds look good.  No abdominal pain.  He has PT Doppler signals that are monophasic.  No lower extremity symptoms.  Plan discharge today.  1 month follow-up with CTA with Dr. Myra Gianotti.  He has a plan to resume his Coumadin which was started last night and his Lovenox bridge at home.  Cephus Shelling, MD [facility] [place] Office: [phone]

## 2022-11-10 NOTE — Evaluation (Signed)
Physical Therapy Evaluation/ Discharge Patient Details Name: Taylor [name] MRN: [id number] DOB: [birth year] Today's Date: 11/10/2022  History of Present Illness  72 yo male admitted 6/13 due to enlarging AAA s/p bil fem endarterectomy with endovascular infrarenal aneurysm repair. PMhx: AICD, ICM, HTN, AAA, CHF, AVR, Afib  Clinical Impression  Pt pleasant, lives alone with sister who can assist as needed. Pt with all necessary DME and able to walk hall distance with pauses due to pain. Pt with rollator at home and encouraged use for activity tolerance and progressive gait limited by pain. Pt able to perform transfers and functional mobility without assist and does not require further therapy intervention at this time with pt aware and agreeable, will sign off.        Recommendations for follow up therapy are one component of a multi-disciplinary discharge planning process, led by the attending physician.  Recommendations may be updated based on patient status, additional functional criteria and insurance authorization.  Follow Up Recommendations       Assistance Recommended at Discharge PRN  Patient can return home with the following  Assistance with cooking/housework;Assist for transportation    Equipment Recommendations None recommended by PT  Recommendations for Other Services       Functional Status Assessment Patient has not had a recent decline in their functional status     Precautions / Restrictions Precautions Precautions: Fall      Mobility  Bed Mobility Overal bed mobility: Modified Independent                  Transfers Overall transfer level: Modified independent                      Ambulation/Gait Ambulation/Gait assistance: Supervision Gait Distance (Feet): 350 Feet Assistive device: None Gait Pattern/deviations: Step-through pattern, Decreased stride length   Gait velocity interpretation: 1.31 - 2.62 ft/sec, indicative of limited community  ambulator   General Gait Details: 2 standing rests with seated rest at 350' then able to walk additional 33' after resting reporting bil calf pain 7/10  Stairs            Wheelchair Mobility    Modified Rankin (Stroke Patients Only)       Balance Overall balance assessment: Mild deficits observed, not formally tested                                           Pertinent Vitals/Pain Pain Assessment Pain Assessment: 0-10 Pain Score: 7  Pain Location: bil calves Pain Descriptors / Indicators: Aching, Sore Pain Intervention(s): Limited activity within patient's tolerance, Monitored during session, Patient requesting pain meds-RN notified, Repositioned    Home Living Family/patient expects to be discharged to:: Private residence Living Arrangements: Alone Available Help at Discharge: Family;Available 24 hours/day Type of Home: House Home Access: Level entry       Home Layout: Two level;Laundry or work area in [employer] Equipment: Agricultural consultant (2 wheels);Cane - single point;Rollator (4 wheels);BSC/3in1 Additional Comments: was going to the gym    Prior Function Prior Level of Function : Independent/Modified Independent;Driving                     Hand Dominance        Extremity/Trunk Assessment   Upper Extremity Assessment Upper Extremity Assessment: Overall WFL for tasks assessed    Lower Extremity Assessment  Lower Extremity Assessment: Overall WFL for tasks assessed    Cervical / Trunk Assessment Cervical / Trunk Assessment: Normal  Communication   Communication: No difficulties  Cognition Arousal/Alertness: Awake/alert Behavior During Therapy: WFL for tasks assessed/performed Overall Cognitive Status: Within Functional Limits for tasks assessed                                          General Comments      Exercises     Assessment/Plan    PT Assessment Patient does not need any further PT  services  PT Problem List         PT Treatment Interventions      PT Goals (Current goals can be found in the Care Plan section)  Acute Rehab PT Goals PT Goal Formulation: All assessment and education complete, DC therapy    Frequency       Co-evaluation               AM-PAC PT "6 Clicks" Mobility  Outcome Measure Help needed turning from your back to your side while in a flat bed without using bedrails?: None Help needed moving from lying on your back to sitting on the side of a flat bed without using bedrails?: None Help needed moving to and from a bed to a chair (including a wheelchair)?: None Help needed standing up from a chair using your arms (e.g., wheelchair or bedside chair)?: None Help needed to walk in hospital room?: None Help needed climbing 3-5 steps with a railing? : A Little 6 Click Score: 23    End of Session   Activity Tolerance: Patient tolerated treatment well Patient left: in chair;with call bell/phone within reach Nurse Communication: Mobility status PT Visit Diagnosis: Other abnormalities of gait and mobility (R26.89)    Time: 4401-0272 PT Time Calculation (min) (ACUTE ONLY): 17 min   Charges:   PT Evaluation $PT Eval Low Complexity: 1 Low          Gottfried Standish P, PT Acute Rehabilitation Services Office: [phone]   Enedina Finner [place] 11/10/2022, 9:52 AM

## 2022-11-10 NOTE — Anesthesia Preprocedure Evaluation (Addendum)
Anesthesia Evaluation  Patient identified by MRN, date of birth, ID band Patient awake    Reviewed: Allergy & Precautions, H&P , NPO status , Patient's Chart, lab work & pertinent test results  Airway Mallampati: II  TM Distance: >3 FB Neck ROM: Full    Dental no notable dental hx. (+) Edentulous Upper, Partial Lower, Dental Advisory Given   Pulmonary former smoker   Pulmonary exam normal breath sounds clear to auscultation       Cardiovascular Exercise Tolerance: Good hypertension, Pt. on medications and Pt. on home beta blockers + CAD, + CABG, + Peripheral Vascular Disease and +CHF  + dysrhythmias + pacemaker + Cardiac Defibrillator  Rhythm:Regular Rate:Normal     Neuro/Psych negative neurological ROS     GI/Hepatic negative GI ROS, Neg liver ROS,,,  Endo/Other  Hypothyroidism    Renal/GU Renal InsufficiencyRenal disease     Musculoskeletal   Abdominal   Peds  Hematology  (+) Blood dyscrasia, anemia   Anesthesia Other Findings   Reproductive/Obstetrics                             Anesthesia Physical Anesthesia Plan  ASA: 4 and emergent  Anesthesia Plan: General   Post-op Pain Management: Tylenol PO (pre-op)*   Induction: Intravenous  PONV Risk Score and Plan: 3 and Ondansetron, Dexamethasone and Treatment may vary due to age or medical condition  Airway Management Planned: Oral ETT  Additional Equipment: Arterial line  Intra-op Plan:   Post-operative Plan: Extubation in OR  Informed Consent: I have reviewed the patients History and Physical, chart, labs and discussed the procedure including the risks, benefits and alternatives for the proposed anesthesia with the patient or authorized representative who has indicated his/her understanding and acceptance.     Dental advisory given  Plan Discussed with: CRNA  Anesthesia Plan Comments: (PAT note by Antionette Poles,  PA-C: Follows with cardiology for hx of  CAD s/p CABG x 3 with redo mechanical AVR 04/2016, ICM, chronic systolic heart failure, valvular heart disease s/p mechanical aortic/mitral valve replacement, V-fib arrest s/p [employer] CRT-D implanted 05/2016, paroxysmal atrial fibrillation/flutter s/p ablation at [facility] 11/2021, hypertension, hyperlipidemia, AAA and essential thrombocytopenia. Seen by Bernadene Person, NP 10/18/22 for preop eval. Pre note, "According to the Revised Cardiac Risk Index (RCRI), his Perioperative Risk of Major Cardiac Event is (%): 11. His Functional Capacity in METs is: 5.81 according to the Duke Activity Status Index (DASI).Therefore, based on ACC/AHA guidelines, patient would be at acceptable risk for the planned procedure without further cardiovascular testing. The patient was advised that if he develops new symptoms prior to surgery to contact our office to arrange for a follow-up visit, and he verbalized understanding. Per office protocol, patient can hold warfarin for 5 days prior to procedure.   Patient WILL need bridging with Lovenox (enoxaparin) around procedure.  Patient was advised that he will need to contact the Coumadin clinic to coordinate Lovenox bridge once surgery date is planned.  Please resume warfarin as soon as possible postprocedure, the discretion of the surgeon. Regarding ASA therapy, we recommend continuation of ASA throughout the perioperative period.  However, if the surgeon feels that cessation of ASA is required in the perioperative period, it may be stopped 5-7 days prior to surgery with a plan to resume it as soon as felt to be feasible from a surgical standpoint in the post-operative period."  Patient reports he was instructed to hold Coumadin 5  days.  He has Lovenox bridge instructions.  Follows with vascular surgery for history of AAA, 5.1 cm, endovascular repair recommended.  Preop labs reviewed, creatinine mildly elevated 1.51, mild anemia with  hemoglobin 12.5, chronic leukocytopenia with WBC 2.6 (felt secondary to Hydrea used to treat essential thrombocytosis, monitored by hematology).  EKG 05/26/2022: Ventricular paced.  Rate 57.  Perioperative prescription for implanted cardiac device programming per progress note 11/01/2022: Device Information:  Clinic EP Physician:  Loman Brooklyn, MD   Device Type:  Defibrillator Manufacturer and Phone #:  [employer]: [phone] Pacemaker Dependent?:  Unknown Date of Last Device Check:  3/7/24Normal Device Function?:  Yes.    Electrophysiologist's Recommendations:  ? Have magnet available. ? Provide continuous ECG monitoring when magnet is used or reprogramming is to be performed.  ? Procedure may interfere with device function.  Magnet should be placed over device during procedure.  TTE 10/18/2021: 1. Akinesis of the inferior and inferolateral walls and remaining walls  hypokinetic; overall severe LV dysfunction; S/P AVR with mild AI and  normal mean gradient.  2. Left ventricular ejection fraction, by estimation, is 25 to 30%. The  left ventricle has severely decreased function. The left ventricle  demonstrates regional wall motion abnormalities (see scoring  diagram/findings for description). The left  ventricular internal cavity size was moderately dilated. Left ventricular  diastolic parameters are consistent with Grade II diastolic dysfunction  (pseudonormalization). Elevated left atrial pressure.  3. Right ventricular systolic function is normal. The right ventricular  size is normal.  4. Left atrial size was mildly dilated.  5. The mitral valve is normal in structure. Mild mitral valve  regurgitation. No evidence of mitral stenosis.  6. The aortic valve has been repaired/replaced. Aortic valve  regurgitation is mild. No aortic stenosis is present. There is a [employer]  valve present in the aortic position.  7. Aortic dilatation noted. There is mild  dilatation of the aortic root,  measuring 39 mm.  8. The inferior vena cava is normal in size with greater than 50%  respiratory variability, suggesting right atrial pressure of 3 mmHg.    )        Anesthesia Quick Evaluation

## 2022-11-10 NOTE — Progress Notes (Addendum)
ANTICOAGULATION CONSULT NOTE  Pharmacy Consult for heparin Indication:  critical limb ischemia  Allergies  Allergen Reactions   Erythromycin Rash   Rosuvastatin Calcium     Muscle cramps   Simvastatin     Muscle cramps   Zithromax [Azithromycin] Rash    Patient Measurements: Height: 5\' 9"  (175.3 cm) Weight: 70.2 kg (154 lb 12.2 oz) IBW/kg (Calculated) : 70.7 Heparin Dosing Weight: 69.9 kg   Vital Signs: Temp: 98.7 F (37.1 C) (06/14 1200) Temp Source: Oral (06/14 1200) BP: 123/60 (06/14 1400) Pulse Rate: 71 (06/14 1400)  Labs: Recent Labs    11/09/22 0725 11/09/22 1148 11/09/22 1201 11/09/22 1258 11/09/22 1330 11/10/22 0215  HGB  --  8.5*   < > 9.9* 10.4* 9.4*  HCT  --  25.0*   < > 29.0* 30.1* 27.2*  PLT  --   --   --   --  316 261  APTT  --   --   --   --  33  --   LABPROT 15.6*  --   --   --  17.2* 16.1*  INR 1.2  --   --   --  1.4* 1.3*  CREATININE  --  1.60*  --   --  1.05 1.27*   < > = values in this interval not displayed.    Estimated Creatinine Clearance: 52.2 mL/min (A) (by C-G formula based on SCr of 1.27 mg/dL (H)).   Medical History: Past Medical History:  Diagnosis Date   Abdominal aortic aneurysm (AAA) (HCC)    AICD (automatic cardioverter/defibrillator) present    Atherosclerotic heart disease of native coronary artery without angina pectoris    Chronic anticoagulation 02/18/2018   Chronic systolic heart failure (HCC) 02/02/2017   Essential thrombocythemia (HCC) 02/15/2017   Hyperlipidemia    Hypertension    Hypothyroidism    Ischemic cardiomyopathy    Leg pain, central, left 04/17/2017   Hx sciatica - pain similar; no red flag signs on hx or exam    Presence of other heart-valve replacement    Presence of permanent cardiac pacemaker    Thrombocytosis    pt states he takes a "chemo pill" for this   Ventricular tachycardia (HCC)     Medications:  Scheduled:   amiodarone  200 mg Oral Daily   aspirin EC  81 mg Oral Daily    carvedilol  3.125 mg Oral BID WC   Chlorhexidine Gluconate Cloth  6 each Topical Daily   dapagliflozin propanediol  10 mg Oral Daily   docusate sodium  100 mg Oral Daily   heparin  4,000 Units Intravenous Once   levothyroxine  75 mcg Oral QAC breakfast   pantoprazole  40 mg Oral Daily   potassium chloride  10 mEq Oral Daily   spironolactone  25 mg Oral QODAY   traZODone  50 mg Oral QHS    Assessment: 71 yom who underwent EVAR on 6/13 - today prior to discharge started to complain about L leg pain. Duplex showing L common femoral artery occlusion. Received enoxaparin 60 mg on 6/14@0923  and last dose warfarin 6/13.  INR is subtherapeutic at 1.3. Hgb 9.4, plt 261. No s/sx of bleeding. Discussed with vascular, plan to start heparin with bolus now.  Goal of Therapy:  Heparin level 0.3-0.7 units/ml Monitor platelets by anticoagulation protocol: Yes   Plan:  Stop warfarin for now Give 4000 units bolus x 1 Start heparin infusion at 1150 units/hr Check anti-Xa level in 8 hours and  daily while on heparin Continue to monitor H&H and platelets  Thank you for allowing pharmacy to participate in this patient's care,  Sherron Monday, PharmD, BCCCP Clinical Pharmacist  Phone: [phone] 11/10/2022 3:36 PM  Please check AMION for all [facility] Pharmacy phone numbers After 10:00 PM, call Main Pharmacy [phone]

## 2022-11-10 NOTE — Transfer of Care (Signed)
Immediate Anesthesia Transfer of Care Note  Patient: Taylor [name]  Procedure(s) Performed: THROMBECTOMY FEMORAL ARTERY (Left) LEFT GREATER SAPHENOUS VEIN HARVEST (Left: Leg Upper) LEFT FEMORAL ENDARTERECTOMY (Left: Groin) LEFT FEMORAL VEIN PATCH ANGIOPLASTY (Left: Groin)  Patient Location: PACU  Anesthesia Type:General  Level of Consciousness: awake, alert , and oriented  Airway & Oxygen Therapy: Patient Spontanous Breathing  Post-op Assessment: Report given to RN and Post -op Vital signs reviewed and stable  Post vital signs: Reviewed and stable  Last Vitals:  Vitals Value Taken Time  BP 142/71 11/10/22 2020  Temp    Pulse 75 11/10/22 2023  Resp 16 11/10/22 2023  SpO2 94 % 11/10/22 2023  Vitals shown include unvalidated device data.  Last Pain:  Vitals:   11/10/22 1745  TempSrc: Oral  PainSc: 4       Patients Stated Pain Goal: 2 (11/10/22 1745)  Complications: No notable events documented.

## 2022-11-10 NOTE — Progress Notes (Addendum)
Patient c/o throbbing pain in his left calf when walking. States before surgery it wasn't doing that. Checked for Homan's sign and patient states that he has pain in left calf when dorsiflexed back. After numerous tries, was finally able to get a faint dopplered pulse in left posterior tib.  Left leg /foot pale and cool to the touch compared to the right leg/foot. Emilie Rutter, PA notified and will come to the bedside to check it.  1340 Emilie Rutter at bedside. See orders. Pulses were doppled Left Posterior tib after numerous tries.

## 2022-11-10 NOTE — Op Note (Signed)
DATE OF SERVICE: 11/10/2022  PATIENT:  Taylor [name]  72 y.o. male  PRE-OPERATIVE DIAGNOSIS:  thrombosed left common femoral endarterectomy  POST-OPERATIVE DIAGNOSIS:  Same  PROCEDURE:   1) left greater saphenous vein harvest 2) re-do left iliofemoral thromboendarterectomy and profundaplasty with vein patch angioplasty  SURGEON:  Surgeon(s) and Role:    * Leonie Douglas, MD - Primary  ASSISTANT: Aggie Moats, PA-C  An experienced assistant was required given the complexity of this procedure and the standard of surgical care. My assistant helped with exposure through counter tension, suctioning, ligation and retraction to better visualize the surgical field.  My assistant expedited sewing during the case by following my sutures. Wherever I use the term "we" in the report, my assistant actively helped me with that portion of the procedure.  ANESTHESIA:   general  EBL:  BLOOD ADMINISTERED:none  DRAINS: none   LOCAL MEDICATIONS USED:  NONE  SPECIMEN:  none  COUNTS: confirmed correct.  TOURNIQUET:  none  PATIENT DISPOSITION:  PACU - hemodynamically stable.   Delay start of Pharmacological VTE agent (>24hrs) due to surgical blood loss or risk of bleeding: no  INDICATION FOR PROCEDURE: Taylor [name] is a 72 y.o. male with sudden development of left leg claudication after EVAR with bilateral common femoral endarterectomy yesterday. Duplex ultrasound was performed. This showed a thrombosed left common femoral artery. After careful discussion of risks, benefits, and alternatives the patient was offered thromboendarterectomy. The patient understood and wished to proceed.  OPERATIVE FINDINGS: Left common femoral artery thrombosed from its bifurcation to the circumflex iliac branch.  Palpable pulse in the external iliac above this.  Patient systemically heparinized and clamps applied to the profunda femoris and superficial femoral artery and external iliac artery.  The patch was  reopened.  Acute clot was found.  Severe plaque was noted in the profunda femoris artery.  I extended the endarterectomy onto the main profunda branch and had control of the second-order branches.  Good technical result was achieved with brisk backbleeding after further endarterectomy.  Greater saphenous vein was harvested and uses of patch angioplasty.  Good technical result was achieved.  Brisk Doppler flow was heard throughout the repair and in the posterior tibial artery at completion.  DESCRIPTION OF PROCEDURE: After identification of the patient in the pre-operative holding area, the patient was transferred to the operating room. The patient was positioned supine on the operating room table. Anesthesia was induced. The abdomen and left leg were prepped and draped in standard fashion. A surgical pause was performed confirming correct patient, procedure, and operative location.  The oblique incision in the left groin was reopened sharply.  All suture material was removed from the groin.  The previous patch angioplasty was skeletonized.  I felt a palpable pulse in the external iliac artery.  No flow was noted in the mid common femoral artery or in its branches.  The patient was systemically heparinized.  Activated clotting time measurements were used throughout the case to confirm adequate anticoagulation.  Henley clamp was applied to the external iliac artery.  Gregory clamps were applied to the profunda femoris and superficial femoral artery.  The patch was excised with an 11 blade.  Acute thrombus was noted in the lumen.  I noted significant plaque in the profunda femoris artery.  I extended the endarterectomy arteriotomy onto the profunda femoris artery.  I encircled the anterior and posterior division of the profunda femoris artery to obtain more control distally on the profunda.  I then  performed a profundoplasty and extended the endarterectomy to the anterior and posterior division.  Here good  backbleeding was achieved.  I also removed a "plug" of plaque out of the origin of the superficial femoral artery to allow future endovascular recanalization if necessary.  The greater saphenous vein was identified at its confluence with the common femoral vein at the saphenofemoral junction.  This was traced peripherally in the leg as far as I could from the oblique groin incision.  I right angle clamp was applied to the distal saphenous vein and the vein excised.  I then applied a Gregory clamp to the saphenofemoral junction and excised the vein here.  The vein was allowed to dwell in a heparinized saline solution.  Both ends of the vein were oversewn with 2-0 silk suture.  The vein was opened longitudinally to make a patch angioplasty.  This was sewn to the femoral arteriotomy using continuous running suture of 5-0 Prolene.  Immediately prior to completion the repair was flushed and de-aired.  The repair was completed.  Clamps were released on the profunda femoris arteries.  Several areas of bleeding were identified and repaired with simple stitch.  A clamp was then released on the external iliac artery.  Pulsatile flow was noted throughout.  Brisk Doppler flow was heard in the profunda femoris artery.  Good Doppler flow was heard in the posterior tibial artery.  Satisfied we ended the case here.  Heparin was reversed with protamine.  Hemostasis was again confirmed in the patch angioplasty.  Hemostasis was confirmed in the surgical field.  The wound was closed in layers using 2-0 Vicryl, 3-0 Vicryl, 4-0 Monocryl.  Dermabond was reapplied to the skin.  Upon completion of the case instrument and sharps counts were confirmed correct. The patient was transferred to the PACU in good condition. I was present for all portions of the procedure.  FOLLOW UP PLAN: Resume heparin in 1 hour postop.  Check echocardiogram tomorrow.  Anticipate patient will be admitted through the weekend.  Rande Brunt. Lenell Antu, MD  [facility] Phone Number: [phone] 11/10/2022 8:29 PM

## 2022-11-10 NOTE — Progress Notes (Signed)
Lower extremity arterial duplex left study completed.  Preliminary results relayed to [place], PA.   See CV Proc for preliminary results report.   Jean Rosenthal, RDMS, RVT

## 2022-11-10 NOTE — Anesthesia Procedure Notes (Signed)
Procedure Name: Intubation Date/Time: 11/10/2022 6:24 PM  Performed by: Randon Goldsmith, CRNAPre-anesthesia Checklist: Patient identified, Emergency Drugs available, Suction available and Patient being monitored Patient Re-evaluated:Patient Re-evaluated prior to induction Oxygen Delivery Method: Circle system utilized Preoxygenation: Pre-oxygenation with 100% oxygen Induction Type: IV induction Ventilation: Mask ventilation without difficulty Laryngoscope Size: Mac and 4 Grade View: Grade I Tube type: Oral Tube size: 7.5 mm Number of attempts: 1 Airway Equipment and Method: Stylet and Oral airway Placement Confirmation: ETT inserted through vocal cords under direct vision, positive ETCO2 and breath sounds checked- equal and bilateral Secured at: 20 cm Tube secured with: Tape Dental Injury: Teeth and Oropharynx as per pre-operative assessment

## 2022-11-10 NOTE — Progress Notes (Addendum)
  Progress Note    11/10/2022 3:32 PM 1 Day Post-Op  Subjective:  Patient experienced short distance L calf claudication when ambulating earlier today.  L foot feels cold   Vitals:   11/10/22 1340 11/10/22 1400  BP: 118/64 123/60  Pulse: 72 71  Resp: 17 14  Temp:    SpO2: 96% 97%   Physical Exam: Lungs:  non labored Extremities:  L foot cold to touch; soft L PT by doppler in mid lower leg Neurologic: A&O  CBC    Component Value Date/Time   WBC 3.5 (L) 11/10/2022 0215   RBC 2.30 (L) 11/10/2022 0215   HGB 9.4 (L) 11/10/2022 0215   HGB 12.6 (L) 02/07/2018 1336   HCT 27.2 (L) 11/10/2022 0215   HCT 35.0 (L) 02/07/2018 1336   PLT 261 11/10/2022 0215   PLT 440 02/07/2018 1336   MCV 118.3 (H) 11/10/2022 0215   MCV 108 (H) 02/07/2018 1336   MCH 40.9 (H) 11/10/2022 0215   MCHC 34.6 11/10/2022 0215   RDW 24.8 (H) 11/10/2022 0215   RDW 15.7 (H) 02/07/2018 1336   LYMPHSABS 0.4 (L) 09/05/2022 1519   LYMPHSABS 0.7 02/07/2018 1336   MONOABS 0.3 09/05/2022 1519   EOSABS 0.0 09/05/2022 1519   EOSABS 0.1 02/07/2018 1336   BASOSABS 0.0 09/05/2022 1519   BASOSABS 0.0 02/07/2018 1336    BMET    Component Value Date/Time   NA 134 (L) 11/10/2022 0215   NA 136 07/04/2022 1212   K 4.5 11/10/2022 0215   CL 103 11/10/2022 0215   CO2 24 11/10/2022 0215   GLUCOSE 133 (H) 11/10/2022 0215   BUN 17 11/10/2022 0215   BUN 24 07/04/2022 1212   CREATININE 1.27 (H) 11/10/2022 0215   CALCIUM 8.4 (L) 11/10/2022 0215   GFRNONAA >60 11/10/2022 0215   GFRAA >60 10/06/2019 1055    INR    Component Value Date/Time   INR 1.3 (H) 11/10/2022 0215   INR 3.3 10/26/2022 1440     Intake/Output Summary (Last 24 hours) at 11/10/2022 1532 Last data filed at 11/10/2022 1300 Gross per 24 hour  Intake 1259.08 ml  Output 1810 ml  Net -550.92 ml     Assessment/Plan:  72 y.o. male is s/p EVAR with B CFA endarterectomies 1 Day Post-Op   Taylor [name] developed short distance claudication pain in  his left calf when ambulating the halls this morning.  I was called to evaluate the patient.  L foot is cold to touch.  I was only able to find a soft PT signal by Doppler in the upper ankle.  Stat ultrasound was ordered demonstrating thrombus in his left common femoral artery.  He will be started on therapeutic heparin and we will return to the operating room for left femoral thrombectomy.  This was discussed with the patient including risks and he is agreeable to proceed.  Consent ordered.    Taylor Rutter, PA-C [facility] [phone] 11/10/2022 3:32 PM  VASCULAR STAFF ADDENDUM: I have independently interviewed and examined the patient. I agree with the above.  To OR for L CFA thrombectomy  Taylor Brunt. Lenell Antu, Taylor [name] Phone Number: [phone] 11/10/2022 6:07 PM

## 2022-11-10 NOTE — Anesthesia Procedure Notes (Addendum)
Arterial Line Insertion Start/End6/14/2024 6:00 PM, 11/10/2022 6:10 PM Performed by: Achille Rich, MD, Darryl Nestle, CRNA, CRNA  Patient location: Pre-op. Preanesthetic checklist: patient identified, IV checked, site marked, risks and benefits discussed, surgical consent, monitors and equipment checked, pre-op evaluation, timeout performed and anesthesia consent Lidocaine 1% used for infiltration Left, radial was placed Catheter size: 20 G Hand hygiene performed  and maximum sterile barriers used   Attempts: 3 Procedure performed using ultrasound guided technique. Ultrasound Notes:anatomy identified, needle tip was noted to be adjacent to the nerve/plexus identified and no ultrasound evidence of intravascular and/or intraneural injection Following insertion, dressing applied and Biopatch. Post procedure assessment: normal and unchanged  Post procedure complications: unsuccessful attempts. Patient tolerated the procedure well with no immediate complications.

## 2022-11-11 ENCOUNTER — Inpatient Hospital Stay (HOSPITAL_COMMUNITY): Payer: Medicare Other

## 2022-11-11 ENCOUNTER — Encounter (HOSPITAL_COMMUNITY): Payer: Self-pay | Admitting: Vascular Surgery

## 2022-11-11 DIAGNOSIS — Z952 Presence of prosthetic heart valve: Secondary | ICD-10-CM

## 2022-11-11 LAB — BPAM RBC
Blood Product Expiration Date: 202407052359
Blood Product Expiration Date: 202407052359
ISSUE DATE / TIME: 202406141816
ISSUE DATE / TIME: 202406141816
Unit Type and Rh: 6200
Unit Type and Rh: 6200

## 2022-11-11 LAB — POCT I-STAT 7, (LYTES, BLD GAS, ICA,H+H)
Acid-Base Excess: 4 mmol/L — ABNORMAL HIGH (ref 0.0–2.0)
Bicarbonate: 27.4 mmol/L (ref 20.0–28.0)
Calcium, Ion: 1.13 mmol/L — ABNORMAL LOW (ref 1.15–1.40)
HCT: 25 % — ABNORMAL LOW (ref 39.0–52.0)
Hemoglobin: 8.5 g/dL — ABNORMAL LOW (ref 13.0–17.0)
O2 Saturation: 100 %
Patient temperature: 36.5
Potassium: 4.3 mmol/L (ref 3.5–5.1)
Sodium: 136 mmol/L (ref 135–145)
TCO2: 29 mmol/L (ref 22–32)
pCO2 arterial: 36.7 mmHg (ref 32–48)
pH, Arterial: 7.479 — ABNORMAL HIGH (ref 7.35–7.45)
pO2, Arterial: 195 mmHg — ABNORMAL HIGH (ref 83–108)

## 2022-11-11 LAB — BASIC METABOLIC PANEL
Anion gap: 8 (ref 5–15)
BUN: 16 mg/dL (ref 8–23)
CO2: 26 mmol/L (ref 22–32)
Calcium: 8.5 mg/dL — ABNORMAL LOW (ref 8.9–10.3)
Chloride: 101 mmol/L (ref 98–111)
Creatinine, Ser: 1.05 mg/dL (ref 0.61–1.24)
GFR, Estimated: >60 mL/min (ref >60)
Glucose, Bld: 140 mg/dL — ABNORMAL HIGH (ref 70–99)
Potassium: 4.7 mmol/L (ref 3.5–5.1)
Sodium: 135 mmol/L (ref 135–145)

## 2022-11-11 LAB — CBC
HCT: 27.9 % — ABNORMAL LOW (ref 39.0–52.0)
Hemoglobin: 9.5 g/dL — ABNORMAL LOW (ref 13.0–17.0)
MCH: 38.6 pg — ABNORMAL HIGH (ref 26.0–34.0)
MCHC: 34.1 g/dL (ref 30.0–36.0)
MCV: 113.4 fL — ABNORMAL HIGH (ref 80.0–100.0)
Platelets: 267 10*3/uL (ref 150–400)
RBC: 2.46 MIL/uL — ABNORMAL LOW (ref 4.22–5.81)
WBC: 4.2 10*3/uL (ref 4.0–10.5)
nRBC: 0 % (ref 0.0–0.2)

## 2022-11-11 LAB — ECHOCARDIOGRAM COMPLETE
AR max vel: 2.58 cm2
AV Area VTI: 2.65 cm2
AV Area mean vel: 2.61 cm2
AV Mean grad: 7.5 mmHg
AV Peak grad: 15.3 mmHg
Ao pk vel: 1.96 m/s
Area-P 1/2: 2.71 cm2
Calc EF: 24.9 %
Height: 69 in
S' Lateral: 5.4 cm
Single Plane A2C EF: 19.4 %
Single Plane A4C EF: 28.3 %
Weight: 2476.21 oz

## 2022-11-11 LAB — PROTIME-INR
INR: 1.2 (ref 0.8–1.2)
Prothrombin Time: 15.5 seconds — ABNORMAL HIGH (ref 11.4–15.2)

## 2022-11-11 LAB — TYPE AND SCREEN

## 2022-11-11 LAB — POCT ACTIVATED CLOTTING TIME
Activated Clotting Time: 159 seconds
Activated Clotting Time: 238 seconds
Activated Clotting Time: 250 seconds

## 2022-11-11 LAB — HEPARIN LEVEL (UNFRACTIONATED): Heparin Unfractionated: 0.72 IU/mL — ABNORMAL HIGH (ref 0.30–0.70)

## 2022-11-11 MED ORDER — HYDROXYUREA 500 MG PO CAPS
500.0000 mg | ORAL_CAPSULE | ORAL | Status: DC
  Administered 2022-11-13: 500 mg via ORAL
  Filled 2022-11-11: qty 1

## 2022-11-11 MED ORDER — WARFARIN SODIUM 5 MG PO TABS
5.0000 mg | ORAL_TABLET | Freq: Once | ORAL | Status: AC
  Administered 2022-11-11: 5 mg via ORAL
  Filled 2022-11-11: qty 1

## 2022-11-11 MED ORDER — HYDROXYUREA 500 MG PO CAPS
500.0000 mg | ORAL_CAPSULE | Freq: Two times a day (BID) | ORAL | Status: DC
  Administered 2022-11-11 – 2022-11-14 (×6): 500 mg via ORAL
  Filled 2022-11-11 (×7): qty 1

## 2022-11-11 MED ORDER — WARFARIN - PHARMACIST DOSING INPATIENT: Freq: Every day | Status: DC

## 2022-11-11 NOTE — Progress Notes (Signed)
ANTICOAGULATION CONSULT NOTE  Pharmacy Consult for heparin Indication:  critical limb ischemia  Allergies  Allergen Reactions   Erythromycin Rash   Rosuvastatin Calcium     Muscle cramps   Simvastatin     Muscle cramps   Zithromax [Azithromycin] Rash    Patient Measurements: Height: 5\' 9"  (175.3 cm) Weight: 70.2 kg (154 lb 12.2 oz) IBW/kg (Calculated) : 70.7 Heparin Dosing Weight: 69.9 kg   Vital Signs: Temp: 97.6 F (36.4 C) (06/15 0300) Temp Source: Oral (06/15 0300) BP: 117/51 (06/15 0830) Pulse Rate: 66 (06/15 0830)  Labs: Recent Labs    11/09/22 1330 11/10/22 0215 11/10/22 1918 11/11/22 0403  HGB 10.4* 9.4* 8.5* 9.5*  HCT 30.1* 27.2* 25.0* 27.9*  PLT 316 261  --  267  APTT 33  --   --   --   LABPROT 17.2* 16.1*  --  15.5*  INR 1.4* 1.3*  --  1.2  HEPARINUNFRC  --   --   --  0.72*  CREATININE 1.05 1.27*  --  1.05     Estimated Creatinine Clearance: 63.1 mL/min (by C-G formula based on SCr of 1.05 mg/dL).   Medical History: Past Medical History:  Diagnosis Date   Abdominal aortic aneurysm (AAA) (HCC)    AICD (automatic cardioverter/defibrillator) present    Atherosclerotic heart disease of native coronary artery without angina pectoris    Chronic anticoagulation 02/18/2018   Chronic systolic heart failure (HCC) 02/02/2017   Essential thrombocythemia (HCC) 02/15/2017   Hyperlipidemia    Hypertension    Hypothyroidism    Ischemic cardiomyopathy    Leg pain, central, left 04/17/2017   Hx sciatica - pain similar; no red flag signs on hx or exam    Presence of other heart-valve replacement    Presence of permanent cardiac pacemaker    Thrombocytosis    pt states he takes a "chemo pill" for this   Ventricular tachycardia (HCC)     Medications:  Scheduled:   sodium chloride   Intravenous Once   amiodarone  200 mg Oral Daily   aspirin EC  81 mg Oral Daily   carvedilol  3.125 mg Oral BID WC   Chlorhexidine Gluconate Cloth  6 each Topical Daily    dapagliflozin propanediol  10 mg Oral Daily   docusate sodium  100 mg Oral Daily   levothyroxine  75 mcg Oral QAC breakfast   pantoprazole  40 mg Oral Daily   potassium chloride  10 mEq Oral Daily   spironolactone  25 mg Oral QODAY   traZODone  50 mg Oral QHS    Assessment: 71 yom who underwent EVAR on 6/13 - today prior to discharge started to complain about L leg pain. Duplex showing L common femoral artery occlusion. Received enoxaparin 60 mg on 6/14@0923  and last dose warfarin 6/13.  Heparin level slightly supratherapeutic on 1150 units/hr s/p restart post-op and rate reduced this AM.  Pharmacy asked to resume Coumadin tonight.  PTA dosing was 2.5 mg daily except 5 mg on Tuesdays.  Goal of Therapy:  Heparin level 0.3-0.7 units/ml Monitor platelets by anticoagulation protocol: Yes   Plan:  Coumadin 5 mg x 1 today. Daily PT/INR.  Reece Leader, Colon Flattery, [facility] Clinical Pharmacist  11/11/2022 10:43 AM   [facility] pharmacy phone numbers are listed on [email or web address]

## 2022-11-11 NOTE — Progress Notes (Signed)
ANTICOAGULATION CONSULT NOTE  Pharmacy Consult for heparin Indication:  critical limb ischemia  Allergies  Allergen Reactions   Erythromycin Rash   Rosuvastatin Calcium     Muscle cramps   Simvastatin     Muscle cramps   Zithromax [Azithromycin] Rash    Patient Measurements: Height: 5\' 9"  (175.3 cm) Weight: 70.2 kg (154 lb 12.2 oz) IBW/kg (Calculated) : 70.7 Heparin Dosing Weight: 69.9 kg   Vital Signs: Temp: 98 F (36.7 C) (06/15 0815) Temp Source: Oral (06/15 0815) BP: 120/62 (06/15 1200) Pulse Rate: 70 (06/15 1500)  Labs: Recent Labs    11/09/22 1330 11/10/22 0215 11/10/22 1918 11/11/22 0403  HGB 10.4* 9.4* 8.5* 9.5*  HCT 30.1* 27.2* 25.0* 27.9*  PLT 316 261  --  267  APTT 33  --   --   --   LABPROT 17.2* 16.1*  --  15.5*  INR 1.4* 1.3*  --  1.2  HEPARINUNFRC  --   --   --  0.72*  CREATININE 1.05 1.27*  --  1.05     Estimated Creatinine Clearance: 63.1 mL/min (by C-G formula based on SCr of 1.05 mg/dL).   Assessment: 71 yom who underwent EVAR on 6/13 - today prior to discharge started to complain about L leg pain. Duplex showing L common femoral artery occlusion. Received enoxaparin 60 mg on 6/14@0923  and last dose warfarin 6/13.  Heparin level slightly supratherapeutic (0.72) on 1050 units/hr. No bleeding noted.   Goal of Therapy:  Heparin level 0.3-0.7 units/ml Monitor platelets by anticoagulation protocol: Yes   Plan:  Decrease heparin to 950 units/hr Follow up a.m. heparin level  Christoper Fabian, PharmD, BCPS Please see amion for complete clinical pharmacist phone list  11/11/2022 4:03 PM

## 2022-11-11 NOTE — Progress Notes (Signed)
ANTICOAGULATION CONSULT NOTE  Pharmacy Consult for heparin Indication:  critical limb ischemia  Allergies  Allergen Reactions   Erythromycin Rash   Rosuvastatin Calcium     Muscle cramps   Simvastatin     Muscle cramps   Zithromax [Azithromycin] Rash    Patient Measurements: Height: 5\' 9"  (175.3 cm) Weight: 70.2 kg (154 lb 12.2 oz) IBW/kg (Calculated) : 70.7 Heparin Dosing Weight: 69.9 kg   Vital Signs: Temp: 97.6 F (36.4 C) (06/15 0300) Temp Source: Oral (06/15 0300) BP: 117/91 (06/15 0400) Pulse Rate: 68 (06/15 0400)  Labs: Recent Labs    11/09/22 1258 11/09/22 1330 11/10/22 0215 11/11/22 0403  HGB 9.9* 10.4* 9.4*  --   HCT 29.0* 30.1* 27.2*  --   PLT  --  316 261  --   APTT  --  33  --   --   LABPROT  --  17.2* 16.1* 15.5*  INR  --  1.4* 1.3* 1.2  HEPARINUNFRC  --   --   --  0.72*  CREATININE  --  1.05 1.27* 1.05     Estimated Creatinine Clearance: 63.1 mL/min (by C-G formula based on SCr of 1.05 mg/dL).   Medical History: Past Medical History:  Diagnosis Date   Abdominal aortic aneurysm (AAA) (HCC)    AICD (automatic cardioverter/defibrillator) present    Atherosclerotic heart disease of native coronary artery without angina pectoris    Chronic anticoagulation 02/18/2018   Chronic systolic heart failure (HCC) 02/02/2017   Essential thrombocythemia (HCC) 02/15/2017   Hyperlipidemia    Hypertension    Hypothyroidism    Ischemic cardiomyopathy    Leg pain, central, left 04/17/2017   Hx sciatica - pain similar; no red flag signs on hx or exam    Presence of other heart-valve replacement    Presence of permanent cardiac pacemaker    Thrombocytosis    pt states he takes a "chemo pill" for this   Ventricular tachycardia (HCC)     Medications:  Scheduled:   sodium chloride   Intravenous Once   amiodarone  200 mg Oral Daily   aspirin EC  81 mg Oral Daily   carvedilol  3.125 mg Oral BID WC   Chlorhexidine Gluconate Cloth  6 each Topical Daily    dapagliflozin propanediol  10 mg Oral Daily   docusate sodium  100 mg Oral Daily   levothyroxine  75 mcg Oral QAC breakfast   pantoprazole  40 mg Oral Daily   potassium chloride  10 mEq Oral Daily   spironolactone  25 mg Oral QODAY   traZODone  50 mg Oral QHS    Assessment: 71 yom who underwent EVAR on 6/13 - today prior to discharge started to complain about L leg pain. Duplex showing L common femoral artery occlusion. Received enoxaparin 60 mg on 6/14@0923  and last dose warfarin 6/13.  Heparin level slightly supratherapeutic on 1150 units/hr s/p restart post-op  Goal of Therapy:  Heparin level 0.3-0.7 units/ml Monitor platelets by anticoagulation protocol: Yes   Plan:  Decrease heparin gtt to 1050 units/hr Follow up 8 hour heparin level to confirm  Daylene Posey, PharmD, [facility] Clinical Pharmacist ED Pharmacist Phone # [phone] 11/11/2022 4:50 AM

## 2022-11-11 NOTE — Progress Notes (Signed)
[facility] [place] PROGRESS NOTE  ASSESSMENT / PLAN: Taylor [name] is a 72 y.o. male status post EVAR with bilateral common femoral endarterectomy 11/09/22. He thrombosed his left femoral patch yesterday requiring re-do thromboendarterectomy with vein patch 11/10/22. His claudication symptoms have resolved.   PRN pain control Diet as tolerated PT / OT / OOB Transfer 4E   SUBJECTIVE: Doing well.  Sore today.  He reports some scrotal discomfort.  Up and walking already today.  No claudication symptoms.  No pain in the foot.  OBJECTIVE: BP (!) 117/51   Pulse 66   Temp 97.6 F (36.4 C) (Oral)   Resp 18   Ht 5\' 9"  (1.753 m)   Wt 70.2 kg   SpO2 98%   BMI 22.85 kg/m   Intake/Output Summary (Last 24 hours) at 11/11/2022 0930 Last data filed at 11/11/2022 0800 Gross per 24 hour  Intake 2516.66 ml  Output 2505 ml  Net 11.66 ml    Elderly gentleman in no acute distress Regular rate and rhythm Unlabored breathing Groin incisions clean, dry, and intact. Brisk right posterior tibial Doppler flow Left posterior tibial Doppler flow brisk, but less than right side     Latest Ref Rng & Units 11/11/2022    4:03 AM 11/10/2022    7:18 PM 11/10/2022    2:15 AM  CBC  WBC 4.0 - 10.5 K/uL 4.2   3.5   Hemoglobin 13.0 - 17.0 g/dL 9.5  8.5  9.4   Hematocrit 39.0 - 52.0 % 27.9  25.0  27.2   Platelets 150 - 400 K/uL 267   261         Latest Ref Rng & Units 11/11/2022    4:03 AM 11/10/2022    7:18 PM 11/10/2022    2:15 AM  CMP  Glucose 70 - 99 mg/dL 409   811   BUN 8 - 23 mg/dL 16   17   Creatinine 9.14 - 1.24 mg/dL 7.82   9.56   Sodium 213 - 145 mmol/L 135  136  134   Potassium 3.5 - 5.1 mmol/L 4.7  4.3  4.5   Chloride 98 - 111 mmol/L 101   103   CO2 22 - 32 mmol/L 26   24   Calcium 8.9 - 10.3 mg/dL 8.5   8.4    Zailyn Rowser N. Lenell Antu, MD [facility] of [facility] Phone Number: [phone] 11/11/2022 9:30 AM

## 2022-11-12 LAB — HEPARIN LEVEL (UNFRACTIONATED)
Heparin Unfractionated: 0.15 IU/mL — ABNORMAL LOW (ref 0.30–0.70)
Heparin Unfractionated: 0.27 IU/mL — ABNORMAL LOW (ref 0.30–0.70)
Heparin Unfractionated: 0.25 IU/mL — ABNORMAL LOW (ref 0.30–0.70)

## 2022-11-12 LAB — PROTIME-INR
INR: 1.1 (ref 0.8–1.2)
Prothrombin Time: 14.7 seconds (ref 11.4–15.2)

## 2022-11-12 LAB — CBC
HCT: 26.8 % — ABNORMAL LOW (ref 39.0–52.0)
Hemoglobin: 9.1 g/dL — ABNORMAL LOW (ref 13.0–17.0)
MCH: 39.2 pg — ABNORMAL HIGH (ref 26.0–34.0)
MCHC: 34 g/dL (ref 30.0–36.0)
MCV: 115.5 fL — ABNORMAL HIGH (ref 80.0–100.0)
Platelets: 272 10*3/uL (ref 150–400)
RBC: 2.32 MIL/uL — ABNORMAL LOW (ref 4.22–5.81)
WBC: 3.7 10*3/uL — ABNORMAL LOW (ref 4.0–10.5)
nRBC: 0 % (ref 0.0–0.2)

## 2022-11-12 MED ORDER — WARFARIN SODIUM 5 MG PO TABS
5.0000 mg | ORAL_TABLET | Freq: Once | ORAL | Status: AC
  Administered 2022-11-12: 5 mg via ORAL
  Filled 2022-11-12: qty 1

## 2022-11-12 MED ORDER — POLYETHYLENE GLYCOL 3350 17 G PO PACK
17.0000 g | PACK | Freq: Two times a day (BID) | ORAL | Status: DC
  Administered 2022-11-12 – 2022-11-14 (×2): 17 g via ORAL
  Filled 2022-11-12 (×4): qty 1

## 2022-11-12 MED ORDER — WARFARIN SODIUM 5 MG PO TABS: 10.0000 mg | ORAL_TABLET | Freq: Once | ORAL | Status: DC

## 2022-11-12 NOTE — Progress Notes (Signed)
ANTICOAGULATION CONSULT NOTE- Follow Up  Pharmacy Consult for heparin and warfarin Indication:  critical limb ischemia  Allergies  Allergen Reactions   Erythromycin Rash   Rosuvastatin Calcium     Muscle cramps   Simvastatin     Muscle cramps   Zithromax [Azithromycin] Rash    Patient Measurements: Height: 5\' 9"  (175.3 cm) Weight: 70.2 kg (154 lb 12.2 oz) IBW/kg (Calculated) : 70.7 Heparin Dosing Weight: 69.9 kg   Vital Signs: Temp: 98 F (36.7 C) (06/16 2020) Temp Source: Oral (06/16 2020) BP: 147/67 (06/16 2020) Pulse Rate: 74 (06/16 2020)  Labs: Recent Labs    11/10/22 0215 11/10/22 1918 11/10/22 1918 11/11/22 0403 11/12/22 0122 11/12/22 1131 11/12/22 2105  HGB 9.4* 8.5*  --  9.5* 9.1*  --   --   HCT 27.2* 25.0*  --  27.9* 26.8*  --   --   PLT 261  --   --  267 272  --   --   LABPROT 16.1*  --   --  15.5* 14.7  --   --   INR 1.3*  --   --  1.2 1.1  --   --   HEPARINUNFRC  --   --    < > 0.72* 0.15* 0.27* 0.25*  CREATININE 1.27*  --   --  1.05  --   --   --    < > = values in this interval not displayed.     Estimated Creatinine Clearance: 63.1 mL/min (by C-G formula based on SCr of 1.05 mg/dL).  Assessment: 71 yom who underwent EVAR on 6/13 - today prior to discharge started to complain about L leg pain. Duplex showing L common femoral artery occlusion. Previously supra-therapeutic on 1050 units/hr with heparin level 0.72 and decreased to 950 units/hr. Patient then became subtherapeutic at 0.15 with no issues with heparin infusion reported, so increased to 1100 units/hr.  Heparin level subtherapeutic (0.25) on 1200 units/hr. No infusion issues or signs/symptoms of bleeding per RN.  Goal of Therapy:  Heparin level 0.3-0.7 units/ml Monitor platelets by anticoagulation protocol: Yes   Plan:  Increase heparin gtt to 1350 units/hr F/U 8h heparin level  Christoper Fabian, PharmD, BCPS Please see amion for complete clinical pharmacist phone list 11/12/2022 10:22  PM

## 2022-11-12 NOTE — Progress Notes (Signed)
ANTICOAGULATION CONSULT NOTE  Pharmacy Consult for heparin Indication:  critical limb ischemia  Allergies  Allergen Reactions   Erythromycin Rash   Rosuvastatin Calcium     Muscle cramps   Simvastatin     Muscle cramps   Zithromax [Azithromycin] Rash    Patient Measurements: Height: 5\' 9"  (175.3 cm) Weight: 70.2 kg (154 lb 12.2 oz) IBW/kg (Calculated) : 70.7 Heparin Dosing Weight: 69.9 kg   Vital Signs: Temp: 98 F (36.7 C) (06/15 1959) Temp Source: Oral (06/15 1959) BP: 130/81 (06/15 1959) Pulse Rate: 69 (06/15 1959)  Labs: Recent Labs    11/09/22 1330 11/10/22 0215 11/10/22 1918 11/11/22 0403 11/12/22 0122  HGB 10.4* 9.4* 8.5* 9.5* 9.1*  HCT 30.1* 27.2* 25.0* 27.9* 26.8*  PLT 316 261  --  267 272  APTT 33  --   --   --   --   LABPROT 17.2* 16.1*  --  15.5* 14.7  INR 1.4* 1.3*  --  1.2 1.1  HEPARINUNFRC  --   --   --  0.72* 0.15*  CREATININE 1.05 1.27*  --  1.05  --      Estimated Creatinine Clearance: 63.1 mL/min (by C-G formula based on SCr of 1.05 mg/dL).   Medical History: Past Medical History:  Diagnosis Date   Abdominal aortic aneurysm (AAA) (HCC)    AICD (automatic cardioverter/defibrillator) present    Atherosclerotic heart disease of native coronary artery without angina pectoris    Chronic anticoagulation 02/18/2018   Chronic systolic heart failure (HCC) 02/02/2017   Essential thrombocythemia (HCC) 02/15/2017   Hyperlipidemia    Hypertension    Hypothyroidism    Ischemic cardiomyopathy    Leg pain, central, left 04/17/2017   Hx sciatica - pain similar; no red flag signs on hx or exam    Presence of other heart-valve replacement    Presence of permanent cardiac pacemaker    Thrombocytosis    pt states he takes a "chemo pill" for this   Ventricular tachycardia (HCC)     Medications:  Scheduled:   sodium chloride   Intravenous Once   amiodarone  200 mg Oral Daily   aspirin EC  81 mg Oral Daily   carvedilol  3.125 mg Oral BID WC    Chlorhexidine Gluconate Cloth  6 each Topical Daily   dapagliflozin propanediol  10 mg Oral Daily   docusate sodium  100 mg Oral Daily   hydroxyurea  500 mg Oral BID   And   [START ON 11/13/2022] hydroxyurea  500 mg Oral Once per day on Mon Wed Fri   levothyroxine  75 mcg Oral QAC breakfast   pantoprazole  40 mg Oral Daily   potassium chloride  10 mEq Oral Daily   spironolactone  25 mg Oral QODAY   traZODone  50 mg Oral QHS   Warfarin - Pharmacist Dosing Inpatient   Does not apply q1600    Assessment: 71 yom who underwent EVAR on 6/13 - today prior to discharge started to complain about L leg pain. Duplex showing L common femoral artery occlusion.   Heparin level subtherapeutic on 950 units/hr, no infusion issues  Goal of Therapy:  Heparin level 0.3-0.7 units/ml Monitor platelets by anticoagulation protocol: Yes   Plan:  Increase heparin gtt to 1100 units/hr F/U 8h heparin level  Daylene Posey, PharmD, [facility] Clinical Pharmacist ED Pharmacist Phone # [phone] 11/12/2022 2:57 AM

## 2022-11-12 NOTE — Anesthesia Postprocedure Evaluation (Signed)
Anesthesia Post Note  Patient: Taylor [name]  Procedure(s) Performed: THROMBECTOMY FEMORAL ARTERY (Left) LEFT GREATER SAPHENOUS VEIN HARVEST (Left: Leg Upper) LEFT FEMORAL ENDARTERECTOMY (Left: Groin) LEFT FEMORAL VEIN PATCH ANGIOPLASTY (Left: Groin)     Patient location during evaluation: PACU Anesthesia Type: General Level of consciousness: awake and alert Pain management: pain level controlled Vital Signs Assessment: post-procedure vital signs reviewed and stable Respiratory status: spontaneous breathing, nonlabored ventilation, respiratory function stable and patient connected to nasal cannula oxygen Cardiovascular status: blood pressure returned to baseline and stable Postop Assessment: no apparent nausea or vomiting Anesthetic complications: no   No notable events documented.  Last Vitals:  Vitals:   11/11/22 2300 11/12/22 0435  BP: (!) 113/92 (!) 105/49  Pulse:    Resp: 20 16  Temp:  36.9 C  SpO2:      Last Pain:  Vitals:   11/12/22 0435  TempSrc: Oral  PainSc:                  Vincenza Dail S

## 2022-11-12 NOTE — Progress Notes (Addendum)
  Progress Note    11/12/2022 8:26 AM 2 Days Post-Op  Subjective:  no complaints   Vitals:   11/11/22 2300 11/12/22 0435  BP: (!) 113/92 (!) 105/49  Pulse:    Resp: 20 16  Temp:  98.5 F (36.9 C)  SpO2:     Physical Exam: Lungs:  non labored Incisions:  groin incisions c/d/i Extremities:  brisk DP and PT doppler signals BLE; feet symmetrically warm Abdomen:  soft, NT, ND Neurologic: A&O  CBC    Component Value Date/Time   WBC 3.7 (L) 11/12/2022 0122   RBC 2.32 (L) 11/12/2022 0122   HGB 9.1 (L) 11/12/2022 0122   HGB 12.6 (L) 02/07/2018 1336   HCT 26.8 (L) 11/12/2022 0122   HCT 35.0 (L) 02/07/2018 1336   PLT 272 11/12/2022 0122   PLT 440 02/07/2018 1336   MCV 115.5 (H) 11/12/2022 0122   MCV 108 (H) 02/07/2018 1336   MCH 39.2 (H) 11/12/2022 0122   MCHC 34.0 11/12/2022 0122   RDW Not Measured 11/12/2022 0122   RDW 15.7 (H) 02/07/2018 1336   LYMPHSABS 0.4 (L) 09/05/2022 1519   LYMPHSABS 0.7 02/07/2018 1336   MONOABS 0.3 09/05/2022 1519   EOSABS 0.0 09/05/2022 1519   EOSABS 0.1 02/07/2018 1336   BASOSABS 0.0 09/05/2022 1519   BASOSABS 0.0 02/07/2018 1336    BMET    Component Value Date/Time   NA 135 11/11/2022 0403   NA 136 07/04/2022 1212   K 4.7 11/11/2022 0403   CL 101 11/11/2022 0403   CO2 26 11/11/2022 0403   GLUCOSE 140 (H) 11/11/2022 0403   BUN 16 11/11/2022 0403   BUN 24 07/04/2022 1212   CREATININE 1.05 11/11/2022 0403   CALCIUM 8.5 (L) 11/11/2022 0403   GFRNONAA >60 11/11/2022 0403   GFRAA >60 10/06/2019 1055    INR    Component Value Date/Time   INR 1.1 11/12/2022 0122   INR 3.3 10/26/2022 1440     Intake/Output Summary (Last 24 hours) at 11/12/2022 0826 Last data filed at 11/12/2022 0500 Gross per 24 hour  Intake 1197.93 ml  Output 1875 ml  Net -677.07 ml     Assessment/Plan:  72 y.o. male is s/p EVAR with B CFA endarterectomies with subsequent L femoral thrombectomy  2 Days Post-Op   BLE well perfused by doppler  exam Incisions are well appearing Encouraged OOB today Miralax added Continue heparin and coumadin per pharmacy Possible d/c home tomorrow   Emilie Rutter, PA-C [facility] [phone] 11/12/2022 8:26 AM  VASCULAR STAFF ADDENDUM: I have independently interviewed and examined the patient. I agree with the above.   Rande Brunt. Lenell Antu, MD [facility] of [facility] [place] Phone Number: [phone] 11/12/2022 11:32 AM

## 2022-11-12 NOTE — Progress Notes (Signed)
ANTICOAGULATION CONSULT NOTE- Follow Up  Pharmacy Consult for heparin and warfarin Indication:  critical limb ischemia  Allergies  Allergen Reactions   Erythromycin Rash   Rosuvastatin Calcium     Muscle cramps   Simvastatin     Muscle cramps   Zithromax [Azithromycin] Rash    Patient Measurements: Height: 5\' 9"  (175.3 cm) Weight: 70.2 kg (154 lb 12.2 oz) IBW/kg (Calculated) : 70.7 Heparin Dosing Weight: 69.9 kg   Vital Signs: Temp: 98.5 F (36.9 C) (06/16 0435) Temp Source: Oral (06/16 0435) BP: 105/49 (06/16 0435)  Labs: Recent Labs    11/09/22 1330 11/10/22 0215 11/10/22 1918 11/11/22 0403 11/12/22 0122 11/12/22 1131  HGB 10.4* 9.4* 8.5* 9.5* 9.1*  --   HCT 30.1* 27.2* 25.0* 27.9* 26.8*  --   PLT 316 261  --  267 272  --   APTT 33  --   --   --   --   --   LABPROT 17.2* 16.1*  --  15.5* 14.7  --   INR 1.4* 1.3*  --  1.2 1.1  --   HEPARINUNFRC  --   --   --  0.72* 0.15* 0.27*  CREATININE 1.05 1.27*  --  1.05  --   --     Estimated Creatinine Clearance: 63.1 mL/min (by C-G formula based on SCr of 1.05 mg/dL).   Medical History: Past Medical History:  Diagnosis Date   Abdominal aortic aneurysm (AAA) (HCC)    AICD (automatic cardioverter/defibrillator) present    Atherosclerotic heart disease of native coronary artery without angina pectoris    Chronic anticoagulation 02/18/2018   Chronic systolic heart failure (HCC) 02/02/2017   Essential thrombocythemia (HCC) 02/15/2017   Hyperlipidemia    Hypertension    Hypothyroidism    Ischemic cardiomyopathy    Leg pain, central, left 04/17/2017   Hx sciatica - pain similar; no red flag signs on hx or exam    Presence of other heart-valve replacement    Presence of permanent cardiac pacemaker    Thrombocytosis    pt states he takes a "chemo pill" for this   Ventricular tachycardia (HCC)     Medications:  Scheduled:   sodium chloride   Intravenous Once   amiodarone  200 mg Oral Daily   aspirin EC  81 mg  Oral Daily   carvedilol  3.125 mg Oral BID WC   Chlorhexidine Gluconate Cloth  6 each Topical Daily   dapagliflozin propanediol  10 mg Oral Daily   docusate sodium  100 mg Oral Daily   hydroxyurea  500 mg Oral BID   And   [START ON 11/13/2022] hydroxyurea  500 mg Oral Once per day on Mon Wed Fri   levothyroxine  75 mcg Oral QAC breakfast   pantoprazole  40 mg Oral Daily   polyethylene glycol  17 g Oral BID   potassium chloride  10 mEq Oral Daily   spironolactone  25 mg Oral QODAY   traZODone  50 mg Oral QHS   warfarin  5 mg Oral ONCE-1600   Warfarin - Pharmacist Dosing Inpatient   Does not apply q1600    Assessment: 71 yom who underwent EVAR on 6/13 - today prior to discharge started to complain about L leg pain. Duplex showing L common femoral artery occlusion. Previously supra-therapeutic on 1050 units/hr with heparin level 0.72 and decreased to 950 units/hr. Patient then became subtherapeutic at 0.15 with no issues with heparin infusion reported, so increased to 1100 units/hr.  Heparin level subtherapeutic (0.27) on 1100 units/hr. Hgb 9.5>9.1 and plts WNL. No infusion issues or signs/symptoms of bleeding per RN.  Pharmacy asked to resume Coumadin on 6/15 PM. PTA dosing was 2.5 mg daily except 5 mg on Tuesdays. Received 5 mg dose on 6/15, current INR 1.1. Given low INR will double PTA dose to 5 mg tonight  Goal of Therapy:  Heparin level 0.3-0.7 units/ml Monitor platelets by anticoagulation protocol: Yes   Plan:  Increase heparin gtt to 1200 units/hr Warfarin 5 mg PO x1 F/U 8h heparin level an daily INR Monitor for sings/symptoms of bleeding   Thanks,  Arabella Merles, PharmD. [facility] Acute Care PGY-1 11/12/2022 12:40 PM

## 2022-11-13 ENCOUNTER — Inpatient Hospital Stay (HOSPITAL_COMMUNITY): Payer: Medicare Other

## 2022-11-13 LAB — PROTIME-INR
INR: 1.2 (ref 0.8–1.2)
Prothrombin Time: 15 seconds (ref 11.4–15.2)

## 2022-11-13 LAB — HEPARIN LEVEL (UNFRACTIONATED): Heparin Unfractionated: 0.39 IU/mL (ref 0.30–0.70)

## 2022-11-13 LAB — CBC
HCT: 28.4 % — ABNORMAL LOW (ref 39.0–52.0)
Hemoglobin: 9.5 g/dL — ABNORMAL LOW (ref 13.0–17.0)
MCH: 39.3 pg — ABNORMAL HIGH (ref 26.0–34.0)
MCHC: 33.5 g/dL (ref 30.0–36.0)
MCV: 117.4 fL — ABNORMAL HIGH (ref 80.0–100.0)
Platelets: UNDETERMINED 10*3/uL (ref 150–400)
RBC: 2.42 MIL/uL — ABNORMAL LOW (ref 4.22–5.81)
WBC: 2.8 10*3/uL — ABNORMAL LOW (ref 4.0–10.5)
nRBC: 0 % (ref 0.0–0.2)

## 2022-11-13 LAB — BPAM RBC
Blood Product Expiration Date: 202406212359
ISSUE DATE / TIME: 202406141816
ISSUE DATE / TIME: 202406141816
Unit Type and Rh: 6200
Unit Type and Rh: 6200

## 2022-11-13 LAB — TYPE AND SCREEN
Antibody Screen: NEGATIVE
Unit division: 0
Unit division: 0

## 2022-11-13 MED ORDER — IOHEXOL 350 MG/ML SOLN
100.0000 mL | Freq: Once | INTRAVENOUS | Status: AC | PRN
  Administered 2022-11-13: 100 mL via INTRAVENOUS

## 2022-11-13 MED ORDER — WARFARIN SODIUM 5 MG PO TABS: 7.5000 mg | ORAL_TABLET | Freq: Once | ORAL | Status: DC

## 2022-11-13 MED ORDER — WARFARIN SODIUM 5 MG PO TABS
5.0000 mg | ORAL_TABLET | Freq: Once | ORAL | Status: AC
  Administered 2022-11-13: 5 mg via ORAL
  Filled 2022-11-13: qty 1

## 2022-11-13 NOTE — Progress Notes (Signed)
Pt walked in the hallway this afternoon and having some soreness in both legs today.  When I arrived in the unit, pt was sitting in chair in hallway.    CTA obtained this afternoon revealed a long segment occlusion of the left SFA with patent 3 vessel runoff.  There was a hematoma in the left groin without extravasation.  Upon further discussion with pt and RN, will not discharge him this late in the day and will re-evaluate in the morning with most likely discharge at that time.    Pt will f/u in 2-3 weeks with ABI.    He will need to get his pain medication from the [facility] pharmacy prior to discharge as that is where it was sent on Friday morning.     Doreatha Massed, [facility] 11/13/2022 4:59 PM

## 2022-11-13 NOTE — Discharge Summary (Signed)
EVAR Discharge Summary   Taylor [name] [facility] 10/24/1950 72 y.o. male  MRN: [id number]  Admission Date: 11/09/2022  Discharge Date: 11/14/2022  Physician: No att. providers found  Admission Diagnosis: AAA (abdominal aortic aneurysm) (HCC) [I71.40] Abdominal aortic aneurysm (AAA) greater than 5.5 cm in diameter in male [facility]) [I71.40] PAD (peripheral artery disease) (HCC) [I73.9]   HPI:   This is a 72 y.o. male  who is a former patient of Dr. Darrick Penna who has been followed for abdominal aortic aneurysm.  Recently by ultrasound it had grown to larger than 5 cm and so he was sent for CT scan.  He denies any abdominal pain or back pain.   The patient is on aspirin and Coumadin for coronary issues, including aortic valve repair.  He has hypercholesterolemia but does not tolerate statins.  He has left-sided AICD.  He sees Dr. Gala Romney for his ischemic cardiomyopathy he is medically managed for hypertension.  He is seeing hematology for essential thrombocytosis  Hospital Course:  The patient was admitted to the hospital and taken to the operating room on 11/10/2022 and underwent: EVAR, bilateral open femoral artery endarterectomy with bovine patch angioplasty, and Shockwave intra-arterial lithotripsy left external iliac, right common iliac, and right external iliac artery by Dr. Myra Gianotti.    Findings:  severe calcification in bilateral iliac arteries which required lithotripsy and angioplasty to advance the sheaths.  Bilateral femoral arteries required endarterectomy and bovine patch repair   Devices:  primary left GORE 26x14x12.  Ipsilateral left extension :20x12.  Contralateral right extension: 20x14  The pt tolerated the procedure well and was transported to the PACU in good condition.   By POD 1, he was doing well with doppler signals bilateral PT.  He was restarted on his coumadin with Lovenox bridging and plan was to discharge him home that day.  He was ambulating and experienced  short distance claudication in the left calf and his left foot was cold to touch.  A soft PT doppler signal could be found.  Stat ultrasound was ordered demonstrating thrombus in his left common femoral artery.  He was started on IV heparin.  He was taken to the operating room and underwent redo left iliofemoral thromboendarterectomy and profundoplasty with vein patch angioplasty with harvest of left GSV on 11/10/2022 by Dr. Lenell Antu.    OPERATIVE FINDINGS: Left common femoral artery thrombosed from its bifurcation to the circumflex iliac branch.  Palpable pulse in the external iliac above this.  Patient systemically heparinized and clamps applied to the profunda femoris and superficial femoral artery and external iliac artery.  The patch was reopened.  Acute clot was found.  Severe plaque was noted in the profunda femoris artery.  I extended the endarterectomy onto the main profunda branch and had control of the second-order branches.  Good technical result was achieved with brisk backbleeding after further endarterectomy.  Greater saphenous vein was harvested and uses of patch angioplasty.  Good technical result was achieved.  Brisk Doppler flow was heard throughout the repair and in the posterior tibial artery at completion.   11/11/2022, his claudication sx resolved.  He was not having any pain in his foot.  He did have some scrotal discomfort.  He was transferred to 4 east that day.    11/12/2022, his BLE were well perfused by doppler exam and incisions looked fine.  He was put on heparin to coumadin bridge.    11/13/2022, pt continue to have Brisk right PT, strong monophasic left. Still with left calf  discomfort, but walked 2 laps yesterday. Will plan on CTA today. If he has SFA disease, We can let him go home today. Anticoagulation plan mapped out in last outpatient note by coumadin clinc.   Pt walked in the hallway this afternoon and having some soreness in both legs today.  When I arrived in the unit,  pt was sitting in chair in hallway.     CTA obtained this afternoon revealed a long segment occlusion of the left SFA with patent 3 vessel runoff.  There was a hematoma in the left groin without extravasation.   Upon further discussion with pt and RN, will not discharge him this late in the day and will re-evaluate in the morning with most likely discharge at that time.     Pt will f/u in 2-3 weeks with ABI.     He will need to get his pain medication from the [facility] pharmacy prior to discharge as that is where it was sent on Friday morning.    11/14/2022, pt continued to have brisk doppler flow bilateral PT with motor and sensory in tact.  His groin incisions looked fine.  He was given a dose of Lovenox for his bridge to coumadin for his mechanical AVR.  He will need INR check on Thursday.  (He has scheduled appt tomorrow but is going to call to change it to Thursday for INR check).   He was discharged home.    CBC    Component Value Date/Time   WBC 3.0 (L) 11/14/2022 0118   RBC 2.16 (L) 11/14/2022 0118   HGB 8.4 (L) 11/14/2022 0118   HGB 12.6 (L) 02/07/2018 1336   HCT 25.2 (L) 11/14/2022 0118   HCT 35.0 (L) 02/07/2018 1336   PLT 294 11/14/2022 0118   PLT 440 02/07/2018 1336   MCV 116.7 (H) 11/14/2022 0118   MCV 108 (H) 02/07/2018 1336   MCH 38.9 (H) 11/14/2022 0118   MCHC 33.3 11/14/2022 0118   RDW Not Measured 11/14/2022 0118   RDW 15.7 (H) 02/07/2018 1336   LYMPHSABS 0.4 (L) 09/05/2022 1519   LYMPHSABS 0.7 02/07/2018 1336   MONOABS 0.3 09/05/2022 1519   EOSABS 0.0 09/05/2022 1519   EOSABS 0.1 02/07/2018 1336   BASOSABS 0.0 09/05/2022 1519   BASOSABS 0.0 02/07/2018 1336    BMET    Component Value Date/Time   NA 135 11/11/2022 0403   NA 136 07/04/2022 1212   K 4.7 11/11/2022 0403   CL 101 11/11/2022 0403   CO2 26 11/11/2022 0403   GLUCOSE 140 (H) 11/11/2022 0403   BUN 16 11/11/2022 0403   BUN 24 07/04/2022 1212   CREATININE 1.05 11/11/2022 0403   CALCIUM 8.5 (L)  11/11/2022 0403   GFRNONAA >60 11/11/2022 0403   GFRAA >60 10/06/2019 1055       Discharge Instructions     Call MD for:  redness, tenderness, or signs of infection (pain, swelling, bleeding, redness, odor or green/yellow discharge around incision site)   Complete by: As directed    Call MD for:  redness, tenderness, or signs of infection (pain, swelling, bleeding, redness, odor or green/yellow discharge around incision site)   Complete by: As directed    Call MD for:  severe or increased pain, loss or decreased feeling  in affected limb(s)   Complete by: As directed    Call MD for:  severe or increased pain, loss or decreased feeling  in affected limb(s)   Complete by: As directed  Call MD for:  temperature >100.5   Complete by: As directed    Call MD for:  temperature >100.5   Complete by: As directed    Discharge instructions   Complete by: As directed    He should have instructions to resume Coumadin and f/u INR ck with Lovenox bridging   Discharge patient   Complete by: As directed    Please make sure pt has pain med from [facility] prior to discharge.  Thanks   Discharge disposition: 01-Home or Self Care   Discharge patient date: 11/14/2022   Resume previous diet   Complete by: As directed    Resume previous diet   Complete by: As directed        Discharge Diagnosis:  AAA (abdominal aortic aneurysm) (HCC) [I71.40] Abdominal aortic aneurysm (AAA) greater than 5.5 cm in diameter in male [facility]) [I71.40] PAD (peripheral artery disease) (HCC) [I73.9]  Secondary Diagnosis: Patient Active Problem List   Diagnosis Date Noted   AAA (abdominal aortic aneurysm) (HCC) 11/09/2022   Abdominal aortic aneurysm (AAA) greater than 5.5 cm in diameter in male (HCC) 11/09/2022   PAD (peripheral artery disease) (HCC) 11/09/2022   PVC's (premature ventricular contractions) 10/31/2021   Heart block AV complete (HCC) 10/31/2021   Atrial flutter (HCC)    Persistent atrial fibrillation (HCC)  09/29/2020   Secondary hypercoagulable state (HCC) 09/29/2020   Ischemic cardiomyopathy 08/04/2019   ICD (implantable cardioverter-defibrillator) in place 08/04/2019   Essential hypertension 06/23/2019   Sepsis (HCC) 06/23/2019   AKI (acute kidney injury) (HCC) 06/23/2019   Hyponatremia 06/23/2019   Normal anion gap metabolic acidosis 06/23/2019   Normocytic anemia 06/23/2019   Acute cholecystitis 06/22/2019   Chronic anticoagulation 02/18/2018   Leg pain, central, left 04/17/2017   Essential thrombocythemia (HCC) 02/15/2017   Encounter for therapeutic drug monitoring 02/15/2017   Aortic valve replaced 02/15/2017   Chronic systolic heart failure (HCC) 02/02/2017   Past Medical History:  Diagnosis Date   Abdominal aortic aneurysm (AAA) (HCC)    AICD (automatic cardioverter/defibrillator) present    Atherosclerotic heart disease of native coronary artery without angina pectoris    Chronic anticoagulation 02/18/2018   Chronic systolic heart failure (HCC) 02/02/2017   Essential thrombocythemia (HCC) 02/15/2017   Hyperlipidemia    Hypertension    Hypothyroidism    Ischemic cardiomyopathy    Leg pain, central, left 04/17/2017   Hx sciatica - pain similar; no red flag signs on hx or exam    Presence of other heart-valve replacement    Presence of permanent cardiac pacemaker    Thrombocytosis    pt states he takes a "chemo pill" for this   Ventricular tachycardia (HCC)      Allergies as of 11/14/2022       Reactions   Erythromycin Rash   Rosuvastatin Calcium    Muscle cramps   Simvastatin    Muscle cramps   Zithromax [azithromycin] Rash        Medication List     TAKE these medications    amiodarone 200 MG tablet Commonly known as: PACERONE To replace Amiodarone 100 mg. Take 1/2 tablet (100 mg) PO in the morning and 1 tablets (200 mg) PO at night.   amoxicillin 500 MG capsule Commonly known as: AMOXIL Take 4 capsules (2.000mg ) one hour before dental  procedures.   aspirin 81 MG tablet Take 81 mg by mouth daily.   carvedilol 3.125 MG tablet Commonly known as: COREG TAKE 1 TABLET(3.125 MG) BY MOUTH TWICE DAILY  WITH A MEAL   [employer] VITAMIN C PO Take 1 tablet by mouth daily.   cyanocobalamin 1000 MCG tablet Commonly known as: VITAMIN B12 Take 1,000 mcg by mouth daily.   dapagliflozin propanediol 10 MG Tabs tablet Commonly known as: Farxiga Take 1 tablet (10 mg total) by mouth daily.   enoxaparin 60 MG/0.6ML injection Commonly known as: LOVENOX Inject 0.6 mLs (60 mg total) into the skin every 12 (twelve) hours.   furosemide 20 MG tablet Commonly known as: Lasix Take 1 tablet (20 mg total) by mouth daily.   hydroxyurea 500 MG capsule Commonly known as: HYDREA TAKE 2 CAPSULES BY MOUTH EVERY TUESDAY, THURSDAY, SATURDAY AND SUNDAY AND 3 CAPSULES BY MOUTH EVERY MONDAY, WEDNESDAY AND FRIDAY   levothyroxine 75 MCG tablet Commonly known as: SYNTHROID Take 75 mcg by mouth daily before breakfast.   oxyCODONE-acetaminophen 5-325 MG tablet Commonly known as: PERCOCET/ROXICET Take 1 tablet by mouth every 6 (six) hours as needed for moderate pain.   potassium chloride 10 MEQ tablet Commonly known as: KLOR-CON M Take 1 tablet (10 mEq total) by mouth daily.   spironolactone 25 MG tablet Commonly known as: ALDACTONE TAKE 1/2 TABLET(12.5 MG) BY MOUTH DAILY What changed: See the new instructions.   traZODone 50 MG tablet Commonly known as: DESYREL Take 50 mg by mouth at bedtime.   Vitamin D 50 MCG (2000 UT) tablet Take 2,000 Units by mouth daily.   warfarin 5 MG tablet Commonly known as: COUMADIN Take as directed. If you are unsure how to take this medication, talk to your nurse or doctor. Original instructions: Take 0.5-1 tablets (2.5-5 mg total) by mouth See admin instructions. Take 1 tablet daily except 1/2 tablet on Mondays and Fridays What changed: additional instructions        Discharge Instructions:  [facility]  Discharge Instructions Endovascular Aortic Aneurysm Repair  Please refer to the following instructions for your post-procedure care. Your surgeon or Physician Assistant will discuss any changes with you.  Activity  You are encouraged to walk as much as you can. You can slowly return to normal activities but must avoid strenuous activity and heavy lifting until your doctor tells you it's OK. Avoid activities such as vacuuming or swinging a gold club. It is normal to feel tired for several weeks after your surgery. Do not drive until your doctor gives the OK and you are no longer taking prescription pain medications. It is also normal to have difficulty with sleep habits, eating, and bowel movements after surgery. These will go away with time.  Bathing/Showering  You may shower after you go home. If you have an incision, do not soak in a bathtub, hot tub, or swim until the incision heals completely.  Incision Care  Shower every day. Clean your incision with mild soap and water. Pat the area dry with a clean towel. You do not need a bandage unless otherwise instructed. Do not apply any ointments or creams to your incision. If you clothing is irritating, you may cover your incision with a dry gauze pad.  Diet  Resume your normal diet. There are no special food restrictions following this procedure. A low fat/low cholesterol diet is recommended for all patients with vascular disease. In order to heal from your surgery, it is CRITICAL to get adequate nutrition. Your body requires vitamins, minerals, and protein. Vegetables are the best source of vitamins and minerals. Vegetables also provide the perfect balance of protein. Processed food has  little nutritional value, so try to avoid this.  Medications  Resume taking all of your medications unless your doctor or Physician Assistnat tells you not to. If your incision is causing pain, you may take over-the-counter pain  relievers such as acetaminophen (Tylenol). If you were prescribed a stronger pain medication, please be aware these medications can cause nausea and constipation. Prevent nausea by taking the medication with a snack or meal. Avoid constipation by drinking plenty of fluids and eating foods with a high amount of fiber, such as fruits, vegetables, and grains.  Do not take Tylenol if you are taking prescription pain medications.   Follow up  Our office will schedule a follow-up appointment with a C.T. scan 3-4 weeks after your surgery.  Please call us immediately for any of the following conditions  Severe or worsening pain in your legs or feet or in your abdomen back or chest. Increased pain, redness, drainage (pus) from your incision sit. Increased abdominal pain, bloating, nausea, vomiting or persistent diarrhea. Fever of 101 degrees or higher. Swelling in your leg (s),  Reduce your risk of vascular disease  Stop smoking. If you would like help call [facility] at 1-800-QUIT-NOW ((240)066-9966) or [place] at [phone]. Manage your cholesterol Maintain a desired weight Control your diabetes Keep your blood pressure down  If you have questions, please call the office at [phone].   Prescriptions given: 1.  Roxicet   Disposition: home  Patient's condition: is Good  Follow up: 1. Dr. Myra Gianotti in 4 weeks with CTA protocol   Doreatha Massed, PA-C [facility] [phone] 11/16/2022  9:54 AM   - For VQI Registry use - Post-op:  Time to Extubation: [x]  In OR, [ ]  < 12 hrs, [ ]  12-24 hrs, [ ]  >=24 hrs Vasopressors Req. Post-op: No MI: No., [ ]  Troponin only, [ ]  EKG or Clinical New Arrhythmia: No CHF: No ICU Stay: 1 day in ICU Transfusion: Yes     If yes, 3 units given  Complications: Resp failure: No., [ ]  Pneumonia, [ ]  Ventilator Chg in renal function: No., [ ]  Inc. Cr > 0.5, [ ]  Temp. Dialysis,  [ ]  Permanent dialysis Leg ischemia: Yes.   , no Surgery needed, [x ] Yes, Surgery needed,  [ ]  Amputation Bowel ischemia: No., [ ]  Medical Rx, [ ]  Surgical Rx Wound complication: No., [ ]  Superficial separation/infection, [ ]  Return to OR Return to OR: Yes  Return to OR for bleeding: No Stroke: No., [ ]  Minor, [ ]  Major  Discharge medications: Statin use:  No - allergy ASA use:  Yes  Plavix use:  No  Beta blocker use:  Yes  ARB use:  No ACEI use:  No CCB use:  No Lovenox/coumadin:  yes

## 2022-11-13 NOTE — Progress Notes (Addendum)
Progress Note    11/13/2022 6:45 AM 3 Days Post-Op  Subjective:  says he is still getting the cramping in the left calf when he walks.  Says the outside of his left calf is sore this morning.  He denies any pain in his left foot and is able to move it normally.  Afebrile HR 60's 100's-140's systolic 96% RA  Vitals:   11/12/22 2335 11/13/22 0334  BP: (!) 113/54 (!) 105/51  Pulse: 66 66  Resp: 20 14  Temp: 98 F (36.7 C) 98.5 F (36.9 C)  SpO2: 94% 96%    Physical Exam: General:  no distress Cardiac:  regular Lungs:  non labored Incisions:  bilateral groin incisions look good.  Left groin with some ecchymosis.  Extremities:  brisk monophasic left PT doppler signal.  Left calf is very soft.  Brisk biphasic right PT and monophasic DP doppler signals.  Motor and sensory are in tact both legs.   Abdomen:  soft.   CBC    Component Value Date/Time   WBC 3.7 (L) 11/12/2022 0122   RBC 2.32 (L) 11/12/2022 0122   HGB 9.1 (L) 11/12/2022 0122   HGB 12.6 (L) 02/07/2018 1336   HCT 26.8 (L) 11/12/2022 0122   HCT 35.0 (L) 02/07/2018 1336   PLT 272 11/12/2022 0122   PLT 440 02/07/2018 1336   MCV 115.5 (H) 11/12/2022 0122   MCV 108 (H) 02/07/2018 1336   MCH 39.2 (H) 11/12/2022 0122   MCHC 34.0 11/12/2022 0122   RDW Not Measured 11/12/2022 0122   RDW 15.7 (H) 02/07/2018 1336   LYMPHSABS 0.4 (L) 09/05/2022 1519   LYMPHSABS 0.7 02/07/2018 1336   MONOABS 0.3 09/05/2022 1519   EOSABS 0.0 09/05/2022 1519   EOSABS 0.1 02/07/2018 1336   BASOSABS 0.0 09/05/2022 1519   BASOSABS 0.0 02/07/2018 1336    BMET    Component Value Date/Time   NA 135 11/11/2022 0403   NA 136 07/04/2022 1212   K 4.7 11/11/2022 0403   CL 101 11/11/2022 0403   CO2 26 11/11/2022 0403   GLUCOSE 140 (H) 11/11/2022 0403   BUN 16 11/11/2022 0403   BUN 24 07/04/2022 1212   CREATININE 1.05 11/11/2022 0403   CALCIUM 8.5 (L) 11/11/2022 0403   GFRNONAA >60 11/11/2022 0403   GFRAA >60 10/06/2019 1055     INR    Component Value Date/Time   INR 1.1 11/12/2022 0122   INR 3.3 10/26/2022 1440     Intake/Output Summary (Last 24 hours) at 11/13/2022 0645 Last data filed at 11/13/2022 1610 Gross per 24 hour  Intake 1286.26 ml  Output 1926 ml  Net -639.74 ml      Assessment/Plan:  72 y.o. male is s/p:   EVAR with B CFA endarterectomies 11/09/2022 by Dr. Myra Gianotti and  subsequent L femoral thrombectomy 11/10/2022 by Dr. Lenell Antu. 3 Days Post-Op   -pt with brisk left monophasic doppler flow left PT and left calf is very soft. He continues to have some claudication sx in the left leg with walking the hallways. His left calf is very soft without evidence of compartment syndrome.  He has motor and sensory in tact bilateral feet.   He does not have rest pain.  He has brisk biphasic doppler flow right PT and monophasic DP also with motor and sensory in tact.  -DVT prophylaxis:  heparin to coumadin bridge for mechanical AVR.  INR today is 1.1.  He has received 2.5mg  6/13, 5mg  6/15 and 5mg  6/16.  INR slow to increase.  Will need INR of at least 2.0 prior to discharge.    Doreatha Massed, PA-C [facility] [phone] 11/13/2022 6:45 AM  Brisk right PT, strong monophasic left.  Still with left calf discomfort, but walked 2 laps yesterday.  Will plan on CTA today.  If he has SFA disease, We can let him go home today.  Anticoagulation plan mapped out in last outpatient note by coumadin clinc.  Durene Cal

## 2022-11-13 NOTE — Progress Notes (Signed)
ANTICOAGULATION CONSULT NOTE- Follow Up  Pharmacy Consult for heparin and warfarin Indication: mAVR  Allergies  Allergen Reactions   Erythromycin Rash   Rosuvastatin Calcium     Muscle cramps   Simvastatin     Muscle cramps   Zithromax [Azithromycin] Rash    Patient Measurements: Height: 5\' 9"  (175.3 cm) Weight: 70.2 kg (154 lb 12.2 oz) IBW/kg (Calculated) : 70.7 Heparin Dosing Weight: 69.9 kg   Vital Signs: Temp: 98.5 F (36.9 C) (06/17 0334) Temp Source: Oral (06/17 0334) BP: 105/51 (06/17 0334) Pulse Rate: 66 (06/17 0334)  Labs: Recent Labs    11/10/22 1918 11/10/22 1918 11/11/22 0403 11/12/22 0122 11/12/22 1131 11/12/22 2105 11/13/22 0643  HGB 8.5*  --  9.5* 9.1*  --   --   --   HCT 25.0*  --  27.9* 26.8*  --   --   --   PLT  --   --  267 272  --   --   --   LABPROT  --   --  15.5* 14.7  --   --  15.0  INR  --   --  1.2 1.1  --   --  1.2  HEPARINUNFRC  --    < > 0.72* 0.15* 0.27* 0.25* 0.39  CREATININE  --   --  1.05  --   --   --   --    < > = values in this interval not displayed.     Estimated Creatinine Clearance: 63.1 mL/min (by C-G formula based on SCr of 1.05 mg/dL).  Assessment: 71 yom who underwent EVAR on 6/13 - today prior to discharge started to complain about L leg pain. Duplex showing L common femoral artery occlusion. Previously supra-therapeutic on 1050 units/hr with heparin level 0.72 and decreased to 950 units/hr. Patient then became subtherapeutic at 0.15 with no issues with heparin infusion reported, so increased to 1100 units/hr.  Heparin is therapeutic this AM. INR remains subtherapeutic at 1.2. We will repeat the higher home dose today. INR should increase tomorrow. Messaged to use Lovenox for bridging instead.   PTA coumadin = coumadin 2.5mg  PO qday except 5mg  Tues Goal of Therapy:  Heparin level 0.3-0.7 units/ml INR 2.5-3.5 Monitor platelets by anticoagulation protocol: Yes   Plan:  Cont heparin gtt 1350 units/hr F/U confirm  heparin level Coumadin 5mg  PO x1 Daily HL and INR  Ulyses Southward, PharmD, BCIDP, AAHIVP, CPP Infectious Disease Pharmacist 11/13/2022 7:50 AM

## 2022-11-14 LAB — CBC
HCT: 25.2 % — ABNORMAL LOW (ref 39.0–52.0)
Hemoglobin: 8.4 g/dL — ABNORMAL LOW (ref 13.0–17.0)
MCH: 38.9 pg — ABNORMAL HIGH (ref 26.0–34.0)
MCHC: 33.3 g/dL (ref 30.0–36.0)
MCV: 116.7 fL — ABNORMAL HIGH (ref 80.0–100.0)
Platelets: 294 10*3/uL (ref 150–400)
RBC: 2.16 MIL/uL — ABNORMAL LOW (ref 4.22–5.81)
WBC: 3 10*3/uL — ABNORMAL LOW (ref 4.0–10.5)
nRBC: 0 % (ref 0.0–0.2)

## 2022-11-14 LAB — HEPARIN LEVEL (UNFRACTIONATED): Heparin Unfractionated: 0.34 IU/mL (ref 0.30–0.70)

## 2022-11-14 LAB — PROTIME-INR
INR: 1.4 — ABNORMAL HIGH (ref 0.8–1.2)
Prothrombin Time: 16.9 seconds — ABNORMAL HIGH (ref 11.4–15.2)

## 2022-11-14 MED ORDER — ENOXAPARIN SODIUM 80 MG/0.8ML IJ SOSY
70.0000 mg | PREFILLED_SYRINGE | Freq: Two times a day (BID) | INTRAMUSCULAR | Status: DC
  Administered 2022-11-14: 70 mg via SUBCUTANEOUS
  Filled 2022-11-14: qty 0.8

## 2022-11-14 MED ORDER — WARFARIN SODIUM 5 MG PO TABS
5.0000 mg | ORAL_TABLET | ORAL | Status: AC
  Administered 2022-11-14: 5 mg via ORAL
  Filled 2022-11-14: qty 1

## 2022-11-14 NOTE — Progress Notes (Addendum)
Discharge instructions reviewed with pt.  Copy of instructions given to pt. Pt has TOC pharmacy filled med at bedside. Pt instructed to take his next  lovenox dose tonight at 8pm, and to start his coumadin 5mg  tomorrow, (he had his 5mg  dose today already). Pt verbalized understanding of his instructions and how to care for his incisions.  Handout instructions read to pt, and has copy. Pt getting dressed, sister to be here about 11:30am.  Will plan to take to d/c lounge if pt's ride is not here when he finishes getting dressed.    Areta Terwilliger,RN SWOT            Pt's sister is here at the main entrance, hospital volunteer to take pt out via wheelchair. Pt's belongings packed and sent with him.

## 2022-11-14 NOTE — Progress Notes (Addendum)
  Progress Note    11/14/2022 6:57 AM 4 Days Post-Op  Subjective:  says he is ready to go home.  He has walked a couple more times since yesterday.  Still some cramping in the left calf but tolerable.    Afebrile HR 60's-80's  100's-150's systolic 100% RA  Vitals:   11/13/22 2335 11/14/22 0317  BP: (!) 100/53 100/64  Pulse: 62 64  Resp: 16 15  Temp: 98.4 F (36.9 C) 98 F (36.7 C)  SpO2: 100% 100%    Physical Exam: General:  no distress Cardiac:  regular Lungs:  non labored Incisions:  bilateral groins clean and dry; ecchymosis left groin Extremities:  brisk bilateral PT doppler signals.  Motor and sensory are in tact Abdomen:  soft, NT  CBC    Component Value Date/Time   WBC 3.0 (L) 11/14/2022 0118   RBC 2.16 (L) 11/14/2022 0118   HGB 8.4 (L) 11/14/2022 0118   HGB 12.6 (L) 02/07/2018 1336   HCT 25.2 (L) 11/14/2022 0118   HCT 35.0 (L) 02/07/2018 1336   PLT 294 11/14/2022 0118   PLT 440 02/07/2018 1336   MCV 116.7 (H) 11/14/2022 0118   MCV 108 (H) 02/07/2018 1336   MCH 38.9 (H) 11/14/2022 0118   MCHC 33.3 11/14/2022 0118   RDW Not Measured 11/14/2022 0118   RDW 15.7 (H) 02/07/2018 1336   LYMPHSABS 0.4 (L) 09/05/2022 1519   LYMPHSABS 0.7 02/07/2018 1336   MONOABS 0.3 09/05/2022 1519   EOSABS 0.0 09/05/2022 1519   EOSABS 0.1 02/07/2018 1336   BASOSABS 0.0 09/05/2022 1519   BASOSABS 0.0 02/07/2018 1336    BMET    Component Value Date/Time   NA 135 11/11/2022 0403   NA 136 07/04/2022 1212   K 4.7 11/11/2022 0403   CL 101 11/11/2022 0403   CO2 26 11/11/2022 0403   GLUCOSE 140 (H) 11/11/2022 0403   BUN 16 11/11/2022 0403   BUN 24 07/04/2022 1212   CREATININE 1.05 11/11/2022 0403   CALCIUM 8.5 (L) 11/11/2022 0403   GFRNONAA >60 11/11/2022 0403   GFRAA >60 10/06/2019 1055    INR    Component Value Date/Time   INR 1.4 (H) 11/14/2022 0118   INR 3.3 10/26/2022 1440     Intake/Output Summary (Last 24 hours) at 11/14/2022 0657 Last data filed at  11/14/2022 4401 Gross per 24 hour  Intake 564.13 ml  Output 2475 ml  Net -1910.87 ml      Assessment/Plan:  72 y.o. male is s/p:   EVAR with B CFA endarterectomies 11/09/2022 by Dr. Myra Gianotti and  subsequent L femoral thrombectomy 11/10/2022 by Dr. Lenell Antu   4 Days Post-Op   -pt continues to have brisk doppler flow bilateral PT with motor and sensory in tact. Still with some left leg claudication.  Discussed with pt to walk as much as he is able.   -bilateral groins are clean-discussed groin wound care and to clean with soap and water daily otherwise keep dry.  He expressed understanding.   -DVT prophylaxis:  heparin to coumadin bridge for mechanical AVR-INR today 1.4. -will discharge home today.  Pharmacy to dose his Lovenox and he will get dose prior to discharge as well as coumadin dose today.  He will need INR check on Thursday.  (He has scheduled appt tomorrow but is going to call to change it to Thursday for INR check).   Doreatha Massed, PA-C [facility] [phone] 11/14/2022 6:57 AM

## 2022-11-14 NOTE — TOC Transition Note (Signed)
Transition of Care (TOC) - CM/SW Discharge Note Donn Pierini RN, BSN Transitions of Care Unit 4E- RN Case Manager See Treatment Team for direct phone #   Patient Details  Name: Taylor [name] MRN: [id number] Date of Birth: March 04, 1951  Transition [facility] Care [facility]) CM/SW Contact:  Darrold Span, RN Phone Number: 11/14/2022, 11:39 AM   Clinical Narrative:    Pt stable for transition home today, sister to provide transport home.  CM Notified by Iantha Fallen liaison -following patient with MD office protocol referral prearranged for [facility] needs- spoke with liaison Rhonda on 6/17 to confirm services- [facility] will follow up with pt post discharge for start of care scheduling and needs.   TOC pharmacy providing d/c medications. Pt to f/u INR on Thur- pt to schedule appointment.  No further TOC needs noted.   Final next level of care: Home w Home Health Services Barriers to Discharge: No Barriers Identified   Patient Goals and CMS Choice    Return home, Vascular office referral for [facility] needs  Discharge Placement                 Home w/ [facility]        Discharge Plan and Services Additional resources added to the After Visit Summary for     Discharge Planning Services: CM Consult Post Acute Care Choice: Home Health          DME Arranged: N/A DME Agency: NA       HH Arranged: PT, RN HH Agency: [facility] Date [facility] Agency Contacted: 11/13/22   Representative spoke with at [facility] Agency: Bjorn Loser  Social Determinants of Health (SDOH) Interventions SDOH Screenings   Food Insecurity: Food Insecurity Present (04/03/2022)  Housing: Medium Risk (04/03/2022)  Transportation Needs: No Transportation Needs (04/03/2022)  Utilities: Not At Risk (04/03/2022)  Depression (PHQ2-9): Low Risk  (04/03/2022)  Tobacco Use: Medium Risk (11/11/2022)     Readmission Risk Interventions    11/14/2022   11:39 AM  Readmission Risk Prevention Plan  Transportation Screening Complete  PCP or  Specialist Appt within 5-7 Days Complete  Home Care Screening Complete  Medication Review (RN CM) Complete

## 2022-11-14 NOTE — Progress Notes (Signed)
ANTICOAGULATION CONSULT NOTE- Follow Up  Pharmacy Consult for heparin and warfarin Indication: mAVR  Allergies  Allergen Reactions   Erythromycin Rash   Rosuvastatin Calcium     Muscle cramps   Simvastatin     Muscle cramps   Zithromax [Azithromycin] Rash    Patient Measurements: Height: 5\' 9"  (175.3 cm) Weight: 70.2 kg (154 lb 12.2 oz) IBW/kg (Calculated) : 70.7 Heparin Dosing Weight: 69.9 kg   Vital Signs: Temp: 98 F (36.7 C) (06/18 0317) Temp Source: Oral (06/18 0317) BP: 100/64 (06/18 0317) Pulse Rate: 64 (06/18 0317)  Labs: Recent Labs    11/12/22 0122 11/12/22 1131 11/12/22 2105 11/13/22 0643 11/14/22 0118  HGB 9.1*  --   --  9.5* 8.4*  HCT 26.8*  --   --  28.4* 25.2*  PLT 272  --   --  PLATELET CLUMPS NOTED ON SMEAR, UNABLE TO ESTIMATE 294  LABPROT 14.7  --   --  15.0 16.9*  INR 1.1  --   --  1.2 1.4*  HEPARINUNFRC 0.15*   < > 0.25* 0.39 0.34   < > = values in this interval not displayed.     Estimated Creatinine Clearance: 63.1 mL/min (by C-G formula based on SCr of 1.05 mg/dL).  Assessment: 71 yom who underwent EVAR on 6/13 - today prior to discharge started to complain about L leg pain. Duplex showing L common femoral artery occlusion. Previously supra-therapeutic on 1050 units/hr with heparin level 0.72 and decreased to 950 units/hr. Patient then became subtherapeutic at 0.15 with no issues with heparin infusion reported, so increased to 1100 units/hr.  Heparin is therapeutic this AM. INR trended up to 1.4. We will repeat the higher home dose today. Transition heparin to Lovenox today.  PTA coumadin = coumadin 2.5mg  PO qday except 5mg  Tues  Goal of Therapy:  Anti-Xa: 0.6-1 INR 2.5-3.5 Monitor platelets by anticoagulation protocol: Yes   Plan:  Dc heparin Lovenox 70mg  SQ BID Coumadin 5mg  PO x1 Daily HL and INR If dc today, coumadin 5 tomorrow and check INR Thursday  Easton Fetty, PharmD, [place], AAHIVP, CPP Infectious Disease  Pharmacist 11/14/2022 7:51 AM

## 2022-11-15 ENCOUNTER — Telehealth: Payer: Self-pay

## 2022-11-15 NOTE — Telephone Encounter (Signed)
Returned phone call to pt in regards to c/o vomiting early AM and a golf ball size fluid filled area at base of penis. After speaking with PA Lelon Mast) advised pt to roll a towel and elevate the scrotum. If there was still concerns pt can be seen on Friday by Dr. Myra Gianotti. After explaining this to pt he verbalized understanding then asked about the vomiting. Pt stated that he had not vomited since this morning and felt fine, he stated "I think something did not agree with my stomach"  I advised since it was an isolated event, if he had another episode of vomiting that was concerning he could give our office a call. Pt declined scheduling an appointment and stated he would give our office a call if he had any concerns.

## 2022-11-16 ENCOUNTER — Emergency Department (HOSPITAL_COMMUNITY): Payer: Medicare Other

## 2022-11-16 ENCOUNTER — Observation Stay (HOSPITAL_COMMUNITY)
Admission: EM | Admit: 2022-11-16 | Discharge: 2022-11-17 | Disposition: A | Discharge status: Home / Self Care | Payer: Medicare Other | Attending: Vascular Surgery | Admitting: Vascular Surgery

## 2022-11-16 ENCOUNTER — Encounter: Payer: Self-pay | Admitting: Surgery

## 2022-11-16 DIAGNOSIS — E039 Hypothyroidism, unspecified: Secondary | ICD-10-CM | POA: Insufficient documentation

## 2022-11-16 DIAGNOSIS — Z7901 Long term (current) use of anticoagulants: Secondary | ICD-10-CM | POA: Insufficient documentation

## 2022-11-16 DIAGNOSIS — Z79899 Other long term (current) drug therapy: Secondary | ICD-10-CM | POA: Diagnosis not present

## 2022-11-16 DIAGNOSIS — Z950 Presence of cardiac pacemaker: Secondary | ICD-10-CM | POA: Diagnosis not present

## 2022-11-16 DIAGNOSIS — Z952 Presence of prosthetic heart valve: Secondary | ICD-10-CM | POA: Insufficient documentation

## 2022-11-16 DIAGNOSIS — I714 Abdominal aortic aneurysm, without rupture, unspecified: Principal | ICD-10-CM | POA: Insufficient documentation

## 2022-11-16 DIAGNOSIS — R112 Nausea with vomiting, unspecified: Secondary | ICD-10-CM

## 2022-11-16 DIAGNOSIS — I2510 Atherosclerotic heart disease of native coronary artery without angina pectoris: Secondary | ICD-10-CM | POA: Diagnosis not present

## 2022-11-16 DIAGNOSIS — Z951 Presence of aortocoronary bypass graft: Secondary | ICD-10-CM | POA: Insufficient documentation

## 2022-11-16 DIAGNOSIS — N179 Acute kidney failure, unspecified: Secondary | ICD-10-CM | POA: Insufficient documentation

## 2022-11-16 DIAGNOSIS — R110 Nausea: Secondary | ICD-10-CM | POA: Diagnosis present

## 2022-11-16 DIAGNOSIS — Z7984 Long term (current) use of oral hypoglycemic drugs: Secondary | ICD-10-CM | POA: Diagnosis not present

## 2022-11-16 DIAGNOSIS — I110 Hypertensive heart disease with heart failure: Secondary | ICD-10-CM | POA: Diagnosis not present

## 2022-11-16 DIAGNOSIS — I4819 Other persistent atrial fibrillation: Secondary | ICD-10-CM | POA: Diagnosis not present

## 2022-11-16 DIAGNOSIS — Z95810 Presence of automatic (implantable) cardiac defibrillator: Secondary | ICD-10-CM | POA: Diagnosis not present

## 2022-11-16 DIAGNOSIS — Z87891 Personal history of nicotine dependence: Secondary | ICD-10-CM | POA: Diagnosis not present

## 2022-11-16 DIAGNOSIS — Z7982 Long term (current) use of aspirin: Secondary | ICD-10-CM | POA: Insufficient documentation

## 2022-11-16 DIAGNOSIS — I5022 Chronic systolic (congestive) heart failure: Secondary | ICD-10-CM | POA: Insufficient documentation

## 2022-11-16 LAB — URINALYSIS, ROUTINE W REFLEX MICROSCOPIC
Bacteria, UA: NONE SEEN
Bilirubin Urine: NEGATIVE
Glucose, UA: >=500 mg/dL — AB
Hgb urine dipstick: NEGATIVE
Ketones, ur: NEGATIVE mg/dL
Leukocytes,Ua: NEGATIVE
Nitrite: NEGATIVE
Protein, ur: NEGATIVE mg/dL
Specific Gravity, Urine: 1.011 (ref 1.005–1.030)
pH: 5 (ref 5.0–8.0)

## 2022-11-16 LAB — CBC
HCT: 30.2 % — ABNORMAL LOW (ref 39.0–52.0)
Hemoglobin: 10.5 g/dL — ABNORMAL LOW (ref 13.0–17.0)
MCH: 41.2 pg — ABNORMAL HIGH (ref 26.0–34.0)
MCHC: 34.8 g/dL (ref 30.0–36.0)
MCV: 118.4 fL — ABNORMAL HIGH (ref 80.0–100.0)
Platelets: 449 10*3/uL — ABNORMAL HIGH (ref 150–400)
RBC: 2.55 MIL/uL — ABNORMAL LOW (ref 4.22–5.81)
WBC: 3.5 10*3/uL — ABNORMAL LOW (ref 4.0–10.5)
nRBC: 0 % (ref 0.0–0.2)

## 2022-11-16 LAB — COMPREHENSIVE METABOLIC PANEL
ALT: 24 U/L (ref 0–44)
AST: 33 U/L (ref 15–41)
Albumin: 3.6 g/dL (ref 3.5–5.0)
Alkaline Phosphatase: 67 U/L (ref 38–126)
Anion gap: 14 (ref 5–15)
BUN: 22 mg/dL (ref 8–23)
CO2: 23 mmol/L (ref 22–32)
Calcium: 9.2 mg/dL (ref 8.9–10.3)
Chloride: 95 mmol/L — ABNORMAL LOW (ref 98–111)
Creatinine, Ser: 1.43 mg/dL — ABNORMAL HIGH (ref 0.61–1.24)
GFR, Estimated: 52 mL/min — ABNORMAL LOW (ref >60)
Glucose, Bld: 110 mg/dL — ABNORMAL HIGH (ref 70–99)
Potassium: 4.5 mmol/L (ref 3.5–5.1)
Sodium: 132 mmol/L — ABNORMAL LOW (ref 135–145)
Total Bilirubin: 1.3 mg/dL — ABNORMAL HIGH (ref 0.3–1.2)
Total Protein: 7.1 g/dL (ref 6.5–8.1)

## 2022-11-16 LAB — PROTIME-INR
INR: 1.7 — ABNORMAL HIGH (ref 0.8–1.2)
Prothrombin Time: 20.2 seconds — ABNORMAL HIGH (ref 11.4–15.2)

## 2022-11-16 LAB — LIPASE, BLOOD: Lipase: 21 U/L (ref 11–51)

## 2022-11-16 MED ORDER — WARFARIN SODIUM 5 MG PO TABS: 5.0000 mg | ORAL_TABLET | Freq: Once | ORAL | Status: DC

## 2022-11-16 MED ORDER — LACTATED RINGERS IV BOLUS
500.0000 mL | Freq: Once | INTRAVENOUS | Status: AC
  Administered 2022-11-16: 500 mL via INTRAVENOUS

## 2022-11-16 MED ORDER — AMIODARONE HCL 200 MG PO TABS: 200.0000 mg | ORAL_TABLET | Freq: Every day | ORAL | Status: DC

## 2022-11-16 MED ORDER — ACETAMINOPHEN 325 MG RE SUPP: 325.0000 mg | RECTAL | Status: DC | PRN

## 2022-11-16 MED ORDER — AMIODARONE HCL 200 MG PO TABS
200.0000 mg | ORAL_TABLET | Freq: Every day | ORAL | Status: DC
  Administered 2022-11-17: 200 mg via ORAL
  Filled 2022-11-16: qty 1

## 2022-11-16 MED ORDER — LABETALOL HCL 5 MG/ML IV SOLN: 10.0000 mg | INTRAVENOUS | Status: DC | PRN

## 2022-11-16 MED ORDER — POTASSIUM CHLORIDE CRYS ER 20 MEQ PO TBCR: 20.0000 meq | EXTENDED_RELEASE_TABLET | Freq: Every day | ORAL | Status: DC | PRN

## 2022-11-16 MED ORDER — WARFARIN - PHARMACIST DOSING INPATIENT: Freq: Every day | Status: DC

## 2022-11-16 MED ORDER — ACETAMINOPHEN 325 MG PO TABS: 325.0000 mg | ORAL_TABLET | ORAL | Status: DC | PRN

## 2022-11-16 MED ORDER — FUROSEMIDE 20 MG PO TABS
20.0000 mg | ORAL_TABLET | Freq: Every day | ORAL | Status: DC
  Administered 2022-11-16 – 2022-11-17 (×2): 20 mg via ORAL
  Filled 2022-11-16 (×2): qty 1

## 2022-11-16 MED ORDER — OXYCODONE-ACETAMINOPHEN 5-325 MG PO TABS: 1.0000 | ORAL_TABLET | Freq: Four times a day (QID) | ORAL | Status: DC | PRN

## 2022-11-16 MED ORDER — TRAZODONE HCL 50 MG PO TABS
50.0000 mg | ORAL_TABLET | Freq: Every day | ORAL | Status: DC
  Administered 2022-11-16: 50 mg via ORAL
  Filled 2022-11-16: qty 1

## 2022-11-16 MED ORDER — LEVOTHYROXINE SODIUM 75 MCG PO TABS
75.0000 ug | ORAL_TABLET | Freq: Every day | ORAL | Status: DC
  Administered 2022-11-17: 75 ug via ORAL
  Filled 2022-11-16: qty 1

## 2022-11-16 MED ORDER — FLEET ENEMA 7-19 GM/118ML RE ENEM: 1.0000 | ENEMA | Freq: Once | RECTAL | Status: DC

## 2022-11-16 MED ORDER — SODIUM CHLORIDE 0.9 % IV SOLN: 500.0000 mL | Freq: Once | INTRAVENOUS | Status: DC | PRN

## 2022-11-16 MED ORDER — WARFARIN SODIUM 5 MG PO TABS
5.0000 mg | ORAL_TABLET | Freq: Once | ORAL | Status: AC
  Administered 2022-11-16: 5 mg via ORAL
  Filled 2022-11-16 (×2): qty 1

## 2022-11-16 MED ORDER — ONDANSETRON HCL 4 MG/2ML IJ SOLN
4.0000 mg | Freq: Once | INTRAMUSCULAR | Status: AC
  Administered 2022-11-16: 4 mg via INTRAVENOUS
  Filled 2022-11-16: qty 2

## 2022-11-16 MED ORDER — ENOXAPARIN SODIUM 60 MG/0.6ML IJ SOSY
70.0000 mg | PREFILLED_SYRINGE | Freq: Two times a day (BID) | INTRAMUSCULAR | Status: DC
  Administered 2022-11-16 – 2022-11-17 (×2): 70 mg via SUBCUTANEOUS
  Filled 2022-11-16 (×3): qty 1.2

## 2022-11-16 MED ORDER — HYDRALAZINE HCL 20 MG/ML IJ SOLN: 5.0000 mg | INTRAMUSCULAR | Status: DC | PRN

## 2022-11-16 MED ORDER — ALUM & MAG HYDROXIDE-SIMETH 200-200-20 MG/5ML PO SUSP: 15.0000 mL | ORAL | Status: DC | PRN

## 2022-11-16 MED ORDER — SODIUM CHLORIDE 0.9 % IV SOLN: INTRAVENOUS | Status: DC

## 2022-11-16 MED ORDER — ONDANSETRON HCL 4 MG/2ML IJ SOLN: 4.0000 mg | Freq: Four times a day (QID) | INTRAMUSCULAR | Status: DC | PRN

## 2022-11-16 MED ORDER — CARVEDILOL 3.125 MG PO TABS
3.1250 mg | ORAL_TABLET | Freq: Two times a day (BID) | ORAL | Status: DC
  Administered 2022-11-17: 3.125 mg via ORAL
  Filled 2022-11-16: qty 1

## 2022-11-16 MED ORDER — AMIODARONE HCL 200 MG PO TABS
200.0000 mg | ORAL_TABLET | Freq: Every day | ORAL | Status: DC
  Administered 2022-11-16: 200 mg via ORAL
  Filled 2022-11-16: qty 1

## 2022-11-16 MED ORDER — MAGNESIUM SULFATE 2 GM/50ML IV SOLN: 2.0000 g | Freq: Every day | INTRAVENOUS | Status: DC | PRN

## 2022-11-16 MED ORDER — IOHEXOL 350 MG/ML SOLN
100.0000 mL | Freq: Once | INTRAVENOUS | Status: AC | PRN
  Administered 2022-11-16: 100 mL via INTRAVENOUS

## 2022-11-16 MED ORDER — METOPROLOL TARTRATE 5 MG/5ML IV SOLN: 2.0000 mg | INTRAVENOUS | Status: DC | PRN

## 2022-11-16 MED ORDER — ASPIRIN 81 MG PO TBEC
81.0000 mg | DELAYED_RELEASE_TABLET | Freq: Every day | ORAL | Status: DC
  Administered 2022-11-17: 81 mg via ORAL
  Filled 2022-11-16: qty 1
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
## 2022-11-16 NOTE — ED Triage Notes (Addendum)
Pt. Had a triple A.surgery last Thursday.  Last night I started having N/V and I'm constipated, last BM was on Tuesday and before that it was before my surgery. Pt stated, when I get here we are suppose to call Dr. Linnell Fulling
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
## 2022-11-16 NOTE — ED Notes (Signed)
Pt gone to CT 

## 2022-11-16 NOTE — Progress Notes (Signed)
ANTICOAGULATION CONSULT NOTE - Initial Consult  Pharmacy Consult for warfarin  Indication: mechanical aortic / mitral valve   Allergies  Allergen Reactions   Erythromycin Rash   Rosuvastatin Calcium     Muscle cramps   Simvastatin     Muscle cramps   Zithromax [Azithromycin] Rash      Vital Signs: Temp: 98 F (36.7 C) (06/20 1639) BP: 128/62 (06/20 1639) Pulse Rate: 72 (06/20 1639)  Labs: Recent Labs    11/14/22 0118 11/16/22 1331 11/16/22 1825  HGB 8.4* 10.5*  --   HCT 25.2* 30.2*  --   PLT 294 449*  --   LABPROT 16.9*  --  20.2*  INR 1.4*  --  1.7*  HEPARINUNFRC 0.34  --   --   CREATININE  --  1.43*  --     Estimated Creatinine Clearance: 46.4 mL/min (A) (by C-G formula based on SCr of 1.43 mg/dL (H)).   Medical History: Past Medical History:  Diagnosis Date   Abdominal aortic aneurysm (AAA) (HCC)    AICD (automatic cardioverter/defibrillator) present    Atherosclerotic heart disease of native coronary artery without angina pectoris    Chronic anticoagulation 02/18/2018   Chronic systolic heart failure (HCC) 02/02/2017   Essential thrombocythemia (HCC) 02/15/2017   Hyperlipidemia    Hypertension    Hypothyroidism    Ischemic cardiomyopathy    Leg pain, central, left 04/17/2017   Hx sciatica - pain similar; no red flag signs on hx or exam    Presence of other heart-valve replacement    Presence of permanent cardiac pacemaker    Thrombocytosis    pt states he takes a "chemo pill" for this   Ventricular tachycardia [facility])      Assessment: Patient recently discharged on 6/18 for scheduled AAA repair and thrombosed left common femoral after endarterectomy (warfarin was held patient on heparin gtt). Patient d/c on Lovenox bridge due to subtherapeutic INR. PTA dosing was 2.5 mg daily except 5 mg on Tuesdays, last dose on 6/19 @ 19:00. Was going to be following up with clinic today.   Note that patient is chronically on amiodarone. INR today 1.7. CC today is  abdominal pain and constipation, admitted for hydration.   Goal of Therapy:  INR 2.5-3.5  Monitor platelets by anticoagulation protocol: Yes   Plan:  Continue Lovenox, adjusted to 70mg  q12h.  Will give warfarin 5mg  today.  Daily INR.   Estill Batten, PharmD, BCCCP  11/16/2022,8:27 PM
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
## 2022-11-17 LAB — BASIC METABOLIC PANEL
Anion gap: 11 (ref 5–15)
Anion gap: 14 (ref 5–15)
BUN: 24 mg/dL — ABNORMAL HIGH (ref 8–23)
BUN: 21 mg/dL (ref 8–23)
CO2: 26 mmol/L (ref 22–32)
CO2: 26 mmol/L (ref 22–32)
Calcium: 8.7 mg/dL — ABNORMAL LOW (ref 8.9–10.3)
Calcium: 8.6 mg/dL — ABNORMAL LOW (ref 8.9–10.3)
Chloride: 96 mmol/L — ABNORMAL LOW (ref 98–111)
Chloride: 96 mmol/L — ABNORMAL LOW (ref 98–111)
Creatinine, Ser: 1.46 mg/dL — ABNORMAL HIGH (ref 0.61–1.24)
Creatinine, Ser: 1.35 mg/dL — ABNORMAL HIGH (ref 0.61–1.24)
GFR, Estimated: 51 mL/min — ABNORMAL LOW (ref >60)
GFR, Estimated: 56 mL/min — ABNORMAL LOW (ref >60)
Glucose, Bld: 117 mg/dL — ABNORMAL HIGH (ref 70–99)
Glucose, Bld: 150 mg/dL — ABNORMAL HIGH (ref 70–99)
Potassium: 4.2 mmol/L (ref 3.5–5.1)
Potassium: 4 mmol/L (ref 3.5–5.1)
Sodium: 133 mmol/L — ABNORMAL LOW (ref 135–145)
Sodium: 136 mmol/L (ref 135–145)

## 2022-11-17 LAB — CBC
HCT: 28 % — ABNORMAL LOW (ref 39.0–52.0)
Hemoglobin: 9.4 g/dL — ABNORMAL LOW (ref 13.0–17.0)
MCH: 39.3 pg — ABNORMAL HIGH (ref 26.0–34.0)
MCHC: 33.6 g/dL (ref 30.0–36.0)
MCV: 117.2 fL — ABNORMAL HIGH (ref 80.0–100.0)
Platelets: 404 10*3/uL — ABNORMAL HIGH (ref 150–400)
RBC: 2.39 MIL/uL — ABNORMAL LOW (ref 4.22–5.81)
WBC: 2.7 10*3/uL — ABNORMAL LOW (ref 4.0–10.5)
nRBC: 0 % (ref 0.0–0.2)

## 2022-11-17 LAB — PROTIME-INR
INR: 1.9 — ABNORMAL HIGH (ref 0.8–1.2)
Prothrombin Time: 21.9 seconds — ABNORMAL HIGH (ref 11.4–15.2)

## 2022-11-17 MED ORDER — WARFARIN SODIUM 5 MG PO TABS: 5.0000 mg | ORAL_TABLET | Freq: Once | ORAL | Status: DC

## 2022-11-17 MED ORDER — SPIRONOLACTONE 25 MG PO TABS: 25.0000 mg | ORAL_TABLET | ORAL | Status: DC

## 2022-11-17 NOTE — Progress Notes (Signed)
Offer to give enema this a.m. patient refused at this time. Instructed patient to let us know when he is ready for it.

## 2022-11-17 NOTE — Discharge Summary (Signed)
Discharge Summary    Taylor [name] [facility] 1951-05-20 72 y.o. male  [id number]  Admission Date: 11/16/2022  Discharge Date: 11/17/2022  Physician: Cephus Shelling, MD  Admission Diagnosis: AAA (abdominal aortic aneurysm) (HCC) [I71.40]   HPI:   This is a 72 y.o. male with history of CAD s/p CABG, systolic CHF, valvular heart disease s/p mechanical aortic/mitral valve on Coumadin, hypertension, hyperlipidemia, AAA status post EVAR that presents to the ED with nausea vomiting.  Patient states he called our office and was directed to come to the ED.  States he started vomiting yesterday morning and had two episodes of emesis yesterday and the third episode this morning.  Also has not had a bowel movement since hospital discharge.  No abdominal pain.  No lower extremity complaints.   Patient had an EVAR last week on 11/09/2022 for abdominal aortic aneurysm with Dr. Myra Gianotti requiring bilateral common femoral endarterectomies.  He had to go back on 11/10/2022 for a thrombosed left common femoral artery after endarterectomy.  Hospital Course:  The patient was admitted to the hospital with N/V.  His incisions looked fine.  He was admitted for IV hydration given AKI with creatinine of 1.43.  His INR was 1.9 and he was continued on Lovenox.   He was given an enema and had a large bowel movement.  He does have a sore on the left heel, which we will need to keep an eye on.  His creatinine was rechecked and it decreased to 1.35.  He is discharged home with plans to have BMP checked early next week.     CBC    Component Value Date/Time   WBC 2.7 (L) 11/17/2022 0145   RBC 2.39 (L) 11/17/2022 0145   HGB 9.4 (L) 11/17/2022 0145   HGB 12.6 (L) 02/07/2018 1336   HCT 28.0 (L) 11/17/2022 0145   HCT 35.0 (L) 02/07/2018 1336   PLT 404 (H) 11/17/2022 0145   PLT 440 02/07/2018 1336   MCV 117.2 (H) 11/17/2022 0145   MCV 108 (H) 02/07/2018 1336   MCH 39.3 (H) 11/17/2022 0145   MCHC 33.6  11/17/2022 0145   RDW Not Measured 11/17/2022 0145   RDW 15.7 (H) 02/07/2018 1336   LYMPHSABS 0.4 (L) 09/05/2022 1519   LYMPHSABS 0.7 02/07/2018 1336   MONOABS 0.3 09/05/2022 1519   EOSABS 0.0 09/05/2022 1519   EOSABS 0.1 02/07/2018 1336   BASOSABS 0.0 09/05/2022 1519   BASOSABS 0.0 02/07/2018 1336    BMET    Component Value Date/Time   NA 136 11/17/2022 1319   NA 136 07/04/2022 1212   K 4.0 11/17/2022 1319   CL 96 (L) 11/17/2022 1319   CO2 26 11/17/2022 1319   GLUCOSE 150 (H) 11/17/2022 1319   BUN 21 11/17/2022 1319   BUN 24 07/04/2022 1212   CREATININE 1.35 (H) 11/17/2022 1319   CALCIUM 8.6 (L) 11/17/2022 1319   GFRNONAA 56 (L) 11/17/2022 1319   GFRAA >60 10/06/2019 1055      Discharge Instructions     Discharge patient   Complete by: As directed    Discharge disposition: 01-Home or Self Care   Discharge patient date: 11/17/2022       Discharge Diagnosis:  AAA (abdominal aortic aneurysm) (HCC) [I71.40]  Secondary Diagnosis: Patient Active Problem List   Diagnosis Date Noted   AAA (abdominal aortic aneurysm) (HCC) 11/09/2022   Abdominal aortic aneurysm (AAA) greater than 5.5 cm in diameter in male (HCC) 11/09/2022   PAD (peripheral artery disease) (  HCC) 11/09/2022   PVC's (premature ventricular contractions) 10/31/2021   Heart block AV complete (HCC) 10/31/2021   Atrial flutter (HCC)    Persistent atrial fibrillation (HCC) 09/29/2020   Secondary hypercoagulable state (HCC) 09/29/2020   Ischemic cardiomyopathy 08/04/2019   ICD (implantable cardioverter-defibrillator) in place 08/04/2019   Essential hypertension 06/23/2019   Sepsis (HCC) 06/23/2019   AKI (acute kidney injury) (HCC) 06/23/2019   Hyponatremia 06/23/2019   Normal anion gap metabolic acidosis 06/23/2019   Normocytic anemia 06/23/2019   Acute cholecystitis 06/22/2019   Chronic anticoagulation 02/18/2018   Leg pain, central, left 04/17/2017   Essential thrombocythemia (HCC) 02/15/2017    Encounter for therapeutic drug monitoring 02/15/2017   Aortic valve replaced 02/15/2017   Chronic systolic heart failure (HCC) 02/02/2017   Past Medical History:  Diagnosis Date   Abdominal aortic aneurysm (AAA) (HCC)    AICD (automatic cardioverter/defibrillator) present    Atherosclerotic heart disease of native coronary artery without angina pectoris    Chronic anticoagulation 02/18/2018   Chronic systolic heart failure (HCC) 02/02/2017   Essential thrombocythemia (HCC) 02/15/2017   Hyperlipidemia    Hypertension    Hypothyroidism    Ischemic cardiomyopathy    Leg pain, central, left 04/17/2017   Hx sciatica - pain similar; no red flag signs on hx or exam    Presence of other heart-valve replacement    Presence of permanent cardiac pacemaker    Thrombocytosis    pt states he takes a "chemo pill" for this   Ventricular tachycardia (HCC)      Allergies as of 11/17/2022       Reactions   Erythromycin Rash   Rosuvastatin Calcium    Muscle cramps   Simvastatin    Muscle cramps   Zithromax [azithromycin] Rash        Medication List     TAKE these medications    amiodarone 200 MG tablet Commonly known as: PACERONE To replace Amiodarone 100 mg. Take 1/2 tablet (100 mg) PO in the morning and 1 tablets (200 mg) PO at night.   amoxicillin 500 MG capsule Commonly known as: AMOXIL Take 4 capsules (2.000mg ) one hour before dental procedures.   aspirin 81 MG tablet Take 81 mg by mouth daily.   atorvastatin 40 MG tablet Commonly known as: LIPITOR Take 40 mg by mouth every evening.   carvedilol 3.125 MG tablet Commonly known as: COREG TAKE 1 TABLET(3.125 MG) BY MOUTH TWICE DAILY WITH A MEAL What changed: See the new instructions.   [employer] VITAMIN C PO Take 1 tablet by mouth daily.   cyanocobalamin 1000 MCG tablet Commonly known as: VITAMIN B12 Take 1,000 mcg by mouth daily.   dapagliflozin propanediol 10 MG Tabs tablet Commonly known as: Farxiga Take 1 tablet  (10 mg total) by mouth daily.   enoxaparin 60 MG/0.6ML injection Commonly known as: LOVENOX Inject 0.6 mLs (60 mg total) into the skin every 12 (twelve) hours.   furosemide 20 MG tablet Commonly known as: Lasix Take 1 tablet (20 mg total) by mouth daily.   hydroxyurea 500 MG capsule Commonly known as: HYDREA TAKE 2 CAPSULES BY MOUTH EVERY TUESDAY, THURSDAY, SATURDAY AND SUNDAY AND 3 CAPSULES BY MOUTH EVERY MONDAY, WEDNESDAY AND FRIDAY   levothyroxine 75 MCG tablet Commonly known as: SYNTHROID Take 75 mcg by mouth daily before breakfast.   oxyCODONE-acetaminophen 5-325 MG tablet Commonly known as: PERCOCET/ROXICET Take 1 tablet by mouth every 6 (six) hours as needed for moderate pain.   potassium chloride 10 MEQ  tablet Commonly known as: KLOR-CON M Take 1 tablet (10 mEq total) by mouth daily.   spironolactone 25 MG tablet Commonly known as: ALDACTONE Take 1 tablet (25 mg total) by mouth every other day. What changed: See the new instructions.   traZODone 50 MG tablet Commonly known as: DESYREL Take 50 mg by mouth at bedtime.   Vitamin D 50 MCG (2000 UT) tablet Take 2,000 Units by mouth daily.   warfarin 5 MG tablet Commonly known as: COUMADIN Take as directed. If you are unsure how to take this medication, talk to your nurse or doctor. Original instructions: Take 0.5-1 tablets (2.5-5 mg total) by mouth See admin instructions. Take 1 tablet daily except 1/2 tablet on Mondays and Fridays What changed: additional instructions        Prescriptions given: none  Instructions: Continue discharge instructions from previous discharge.   Disposition: home  Patient's condition: is Good  Follow up: 1. Dr. Myra Gianotti in 2-3 weeks with ABI   Doreatha Massed, PA-C [facility] [phone] 11/17/2022  2:34 PM

## 2022-11-17 NOTE — Progress Notes (Signed)
ANTICOAGULATION CONSULT NOTE - Initial Consult  Pharmacy Consult for warfarin  Indication: mechanical aortic / mitral valve   Allergies  Allergen Reactions   Erythromycin Rash   Rosuvastatin Calcium     Muscle cramps   Simvastatin     Muscle cramps   Zithromax [Azithromycin] Rash   Height: 5\' 9"  (175.3 cm) Weight: 68.8 kg (151 lb 11.2 oz) IBW/kg (Calculated) : 70.7  Vital Signs: Temp: 98.5 F (36.9 C) (06/21 0433) Temp Source: Oral (06/21 0433) BP: 102/53 (06/21 0433) Pulse Rate: 70 (06/21 0433)  Labs: Recent Labs    11/16/22 1331 11/16/22 1825 11/17/22 0145  HGB 10.5*  --  9.4*  HCT 30.2*  --  28.0*  PLT 449*  --  404*  LABPROT  --  20.2* 21.9*  INR  --  1.7* 1.9*  CREATININE 1.43*  --  1.46*     Estimated Creatinine Clearance: 44.5 mL/min (A) (by C-G formula based on SCr of 1.46 mg/dL (H)).   Medical History: Past Medical History:  Diagnosis Date   Abdominal aortic aneurysm (AAA) (HCC)    AICD (automatic cardioverter/defibrillator) present    Atherosclerotic heart disease of native coronary artery without angina pectoris    Chronic anticoagulation 02/18/2018   Chronic systolic heart failure (HCC) 02/02/2017   Essential thrombocythemia (HCC) 02/15/2017   Hyperlipidemia    Hypertension    Hypothyroidism    Ischemic cardiomyopathy    Leg pain, central, left 04/17/2017   Hx sciatica - pain similar; no red flag signs on hx or exam    Presence of other heart-valve replacement    Presence of permanent cardiac pacemaker    Thrombocytosis    pt states he takes a "chemo pill" for this   Ventricular tachycardia [facility])      Assessment: Patient recently discharged on 6/18 for scheduled AAA repair and thrombosed left common femoral after endarterectomy (warfarin was held patient on heparin gtt). Patient d/c on Lovenox bridge due to subtherapeutic INR. PTA dosing was 2.5 mg daily except 5 mg on Tuesdays, last dose on 6/19 @ 19:00. Was going to be following up with  clinic today.   Note that patient is chronically on amiodarone. CC today is abdominal pain and constipation, admitted for hydration.   INR up to 1.9 today. We will repeat with higher dose today with lovenox bridging.  Goal of Therapy:  INR 2.5-3.5  Monitor platelets by anticoagulation protocol: Yes   Plan:  Continue Lovenox, adjusted to 70mg  q12h.  Will give warfarin 5mg  today.  Daily INR.   Ulyses Southward, PharmD, BCIDP, AAHIVP, CPP Infectious Disease Pharmacist 11/17/2022 7:22 AM

## 2022-11-17 NOTE — Progress Notes (Cosign Needed Addendum)
  Progress Note    11/17/2022 6:36 AM   Subjective:  says he is feeling better.  Says he is getting an enema this morning.  Says he has a sore on the left heel that is hurting  Afebrile HR 50's-70's 90's-120's systolic 94% RA  Vitals:   11/16/22 2326 11/17/22 0433  BP: (!) 97/57 (!) 102/53  Pulse: 73 70  Resp: 20 20  Temp: 98.4 F (36.9 C) 98.5 F (36.9 C)  SpO2: 98% 97%    Physical Exam: General:  no distress Cardiac:  regular Lungs:  non labored Incisions:  bilateral groins healing nicely; left groin with hematoma unchanged.   Extremities:  brisk PT doppler signals;  left lateral heel ulcer  Abdomen:  soft, NT/ND  CBC    Component Value Date/Time   WBC 2.7 (L) 11/17/2022 0145   RBC 2.39 (L) 11/17/2022 0145   HGB 9.4 (L) 11/17/2022 0145   HGB 12.6 (L) 02/07/2018 1336   HCT 28.0 (L) 11/17/2022 0145   HCT 35.0 (L) 02/07/2018 1336   PLT 404 (H) 11/17/2022 0145   PLT 440 02/07/2018 1336   MCV 117.2 (H) 11/17/2022 0145   MCV 108 (H) 02/07/2018 1336   MCH 39.3 (H) 11/17/2022 0145   MCHC 33.6 11/17/2022 0145   RDW Not Measured 11/17/2022 0145   RDW 15.7 (H) 02/07/2018 1336   LYMPHSABS 0.4 (L) 09/05/2022 1519   LYMPHSABS 0.7 02/07/2018 1336   MONOABS 0.3 09/05/2022 1519   EOSABS 0.0 09/05/2022 1519   EOSABS 0.1 02/07/2018 1336   BASOSABS 0.0 09/05/2022 1519   BASOSABS 0.0 02/07/2018 1336    BMET    Component Value Date/Time   NA 133 (L) 11/17/2022 0145   NA 136 07/04/2022 1212   K 4.2 11/17/2022 0145   CL 96 (L) 11/17/2022 0145   CO2 26 11/17/2022 0145   GLUCOSE 117 (H) 11/17/2022 0145   BUN 24 (H) 11/17/2022 0145   BUN 24 07/04/2022 1212   CREATININE 1.46 (H) 11/17/2022 0145   CALCIUM 8.7 (L) 11/17/2022 0145   GFRNONAA 51 (L) 11/17/2022 0145   GFRAA >60 10/06/2019 1055    INR    Component Value Date/Time   INR 1.9 (H) 11/17/2022 0145   INR 3.3 10/26/2022 1440     Intake/Output Summary (Last 24 hours) at 11/17/2022 0636 Last data filed at  11/17/2022 0435 Gross per 24 hour  Intake 864.39 ml  Output 600 ml  Net 264.39 ml      Assessment/Plan:  72 y.o. male is s/p:  EVAR with B CFA endarterectomies 11/09/2022 by Dr. Myra Gianotti and subsequent L femoral thrombectomy 11/10/2022 by Dr. Lenell Antu readmitted with N/V, dehydration and constipation on 11/16/2022   -pt with brisk doppler flow bilateral PT.  He has a superficial sore on the left lateral heel.  I have floated his foot off the bed and discussed importance of this in light of his SFA occlusion. Will continue to monitor closely.  -AKI with creatinine 1.46 today essentially unchanged from yesterday at 1.43.  continue with IV hydration -DVT prophylaxis:  Lovenox to coumadin-pharmacy has been consulted.  INR 1.9 today.    Doreatha Massed, PA-C [facility] [phone] 11/17/2022 6:36 AM

## 2022-11-17 NOTE — Progress Notes (Signed)
Patient received via stretcher from E.R. . Aert and orin. R.N. into do assessment . Patient orin. To room and equipment. Patient did not want Enema tonight wants it in a.m. Dr. Chestine Spore aware and is o.k with this.

## 2022-11-20 ENCOUNTER — Other Ambulatory Visit (HOSPITAL_COMMUNITY): Payer: Self-pay

## 2022-11-21 ENCOUNTER — Ambulatory Visit: Payer: Medicare Other | Attending: Internal Medicine | Admitting: *Deleted

## 2022-11-21 DIAGNOSIS — Z5181 Encounter for therapeutic drug level monitoring: Secondary | ICD-10-CM

## 2022-11-21 DIAGNOSIS — Z952 Presence of prosthetic heart valve: Secondary | ICD-10-CM | POA: Diagnosis not present

## 2022-11-21 LAB — POCT INR: INR: 2.9 (ref 2.0–3.0)

## 2022-11-21 NOTE — Progress Notes (Signed)
Remote ICD transmission.   

## 2022-11-21 NOTE — Patient Instructions (Signed)
Continue taking 1/2 tablet daily except for 1 tablet on Tuesdays  Stop Lovenox On Amiodarone 300mg  daily Recheck INR 1 wk

## 2022-11-22 ENCOUNTER — Ambulatory Visit: Payer: Medicare Other | Attending: Internal Medicine | Admitting: Cardiology

## 2022-11-22 ENCOUNTER — Encounter: Payer: Medicare Other | Admitting: Internal Medicine

## 2022-11-22 ENCOUNTER — Other Ambulatory Visit: Payer: Self-pay

## 2022-11-22 ENCOUNTER — Encounter: Payer: Self-pay | Admitting: Cardiology

## 2022-11-22 VITALS — BP 126/70 | HR 74 | Ht 69.0 in | Wt 157.8 lb

## 2022-11-22 DIAGNOSIS — I4819 Other persistent atrial fibrillation: Secondary | ICD-10-CM

## 2022-11-22 DIAGNOSIS — D6869 Other thrombophilia: Secondary | ICD-10-CM

## 2022-11-22 DIAGNOSIS — I2510 Atherosclerotic heart disease of native coronary artery without angina pectoris: Secondary | ICD-10-CM

## 2022-11-22 DIAGNOSIS — Z952 Presence of prosthetic heart valve: Secondary | ICD-10-CM | POA: Diagnosis not present

## 2022-11-22 DIAGNOSIS — Z79899 Other long term (current) drug therapy: Secondary | ICD-10-CM

## 2022-11-22 DIAGNOSIS — I5022 Chronic systolic (congestive) heart failure: Secondary | ICD-10-CM

## 2022-11-22 MED ORDER — CARVEDILOL 3.125 MG PO TABS: 3.1250 mg | ORAL_TABLET | Freq: Two times a day (BID) | ORAL | 0 refills | Status: DC

## 2022-11-22 MED ORDER — SPIRONOLACTONE 25 MG PO TABS: 25.0000 mg | ORAL_TABLET | ORAL | 0 refills | Status: DC

## 2022-11-22 NOTE — Telephone Encounter (Signed)
This is a CHF pt. Dr. Gala Romney. Please address

## 2022-11-22 NOTE — Progress Notes (Signed)
Electrophysiology Office Note:   Date:  11/22/2022  ID:  Jacobus Colvin [place], [place] Oct 25, 1950, MRN [id number]  Primary Cardiologist: None Electrophysiologist: Thurston Brendlinger Jorja Loa, MD      History of Present Illness:   Kordell Jafri is a 72 y.o. male with h/o coronary artery disease, chronic systolic heart failure seen today for post hospital follow up.    Admitted June 2024 for AAA EVAR and PAD with shockwave intra-arterial lithotripsy left internal iliac, right common iliac, right external iliac arteries.  He was readmitted with nausea vomiting and dehydration.  He required IV hydration.  Since discharge from hospital the patient reports doing.  He has no chest pain or shortness of breath.  He is able to do all of his daily activities without restriction.  He says that his legs feel much improved since having his vascular intervention.  he denies chest pain, palpitations, dyspnea, PND, orthopnea, nausea, vomiting, dizziness, syncope, edema, weight gain, or early satiety.   Review of systems complete and found to be negative unless listed in HPI.     He has a history of hypertension, hyperlipidemia, hypothyroidism, coronary artery disease post CABG and multiple PCI's, mechanical AVR, VT/VF arrest in 2017, atrial flutter post ablation 12/20/2021 at [facility], alcohol and tobacco abuse.  He has complete heart block.  He is status post [employer] CRT-D implanted for 06/02/2016.  He has had appropriate therapy for VT/VF.      EP information / Studies Reviewed:    EKG is not ordered today. EKG from 11/16/22 reviewed which showed A sense, V pace      ICD Interrogation-  reviewed in detail today,  See PACEART report.  Device History: [employer] BiV ICD implanted 06/02/2016 for [facility] heart failure, cardiac arrest History of appropriate therapy: Yes History of AAD therapy: Yes; currently on amiodarone    Risk Assessment/Calculations:    CHA2DS2-VASc Score = 4   This indicates  a 4.8% annual risk of stroke. The patient's score is based upon: CHF History: 1 HTN History: 1 Diabetes History: 0 Stroke History: 0 Vascular Disease History: 1 Age Score: 1 Gender Score: 0             Physical Exam:   VS:  BP 126/70   Pulse 74   Ht 5\' 9"  (1.753 m)   Wt 157 lb 12.8 oz (71.6 kg)   SpO2 99%   BMI 23.30 kg/m    Wt Readings from Last 3 Encounters:  11/22/22 157 lb 12.8 oz (71.6 kg)  11/16/22 151 lb 11.2 oz (68.8 kg)  11/10/22 154 lb 12.2 oz (70.2 kg)     GEN: Well nourished, well developed in no acute distress NECK: No JVD; No carotid bruits CARDIAC: Regular rate and rhythm, no murmurs, rubs, gallops RESPIRATORY:  Clear to auscultation without rales, wheezing or rhonchi  ABDOMEN: Soft, non-tender, non-distended EXTREMITIES:  No edema; No deformity   ASSESSMENT AND PLAN:    1.  Chronic systolic dysfunction s/p [employer] CRT-D  euvolemic today Stable on an appropriate medical regimen Normal ICD function See Pace Art report No changes today  2.  History of VT/VF arrest: Currently on amiodarone.  Further ventricular arrhythmias.  3.  Mechanical aortic and mitral valves: Continue warfarin for anticoagulation  4.  Atrial fibrillation/flutter: Post ablation at [facility] June 2023.  On Coumadin.  5.  Coronary disease: Denies ischemia  6.  High risk medication monitoring: Currently on amiodarone.  Recent labs within normal limits.  7.  Secondary  hypercoagulable state: Currently on warfarin for atrial fibrillation and valve replacement  Disposition:   Follow up with EP APP in 6 months   Signed, Taziah Difatta Jorja Loa, MD

## 2022-11-22 NOTE — Patient Instructions (Signed)
Medication Instructions:  Your physician recommends that you continue on your current medications as directed. Please refer to the Current Medication list given to you today.  *If you need a refill on your cardiac medications before your next appointment, please call your pharmacy*   Lab Work: None ordered   Testing/Procedures: None ordered   Follow-Up: At [facility], you and your health needs are our priority.  As part of our continuing mission to provide you with exceptional heart care, we have created designated Provider Care Teams.  These Care Teams include your primary Cardiologist (physician) and Advanced Practice Providers (APPs -  Physician Assistants and Nurse Practitioners) who all work together to provide you with the care you need, when you need it.  Your next appointment:   6 month(s)  The format for your next appointment:   In Person  Provider:   You will see one of the following Advanced Practice Providers on your designated Care Team:   Renee Ursuy, PA-C Michael "Andy" Tillery, PA-C   Thank you for choosing [facility]!!   Elektra Wartman, RN [phone]         

## 2022-11-27 ENCOUNTER — Other Ambulatory Visit: Payer: Self-pay | Admitting: Internal Medicine

## 2022-11-28 ENCOUNTER — Ambulatory Visit: Payer: Medicare Other | Attending: Internal Medicine | Admitting: *Deleted

## 2022-11-28 DIAGNOSIS — Z5181 Encounter for therapeutic drug level monitoring: Secondary | ICD-10-CM

## 2022-11-28 DIAGNOSIS — Z952 Presence of prosthetic heart valve: Secondary | ICD-10-CM

## 2022-11-28 LAB — POCT INR: INR: 1.9 — AB (ref 2.0–3.0)

## 2022-11-28 NOTE — Patient Instructions (Signed)
Increase warfarin to 1/2 tablet daily except for 1 tablet on Tuesdays and Fridays On Amiodarone 300mg  daily Recheck INR 1 wk

## 2022-12-06 ENCOUNTER — Ambulatory Visit: Payer: Medicare Other | Attending: Internal Medicine | Admitting: *Deleted

## 2022-12-06 DIAGNOSIS — Z952 Presence of prosthetic heart valve: Secondary | ICD-10-CM | POA: Diagnosis not present

## 2022-12-06 DIAGNOSIS — Z5181 Encounter for therapeutic drug level monitoring: Secondary | ICD-10-CM | POA: Diagnosis not present

## 2022-12-06 LAB — POCT INR: INR: 2.8 (ref 2.0–3.0)

## 2022-12-06 NOTE — Patient Instructions (Signed)
Continue warfarin 1/2 tablet daily except for 1 tablet on Tuesdays and Fridays On Amiodarone 300mg  daily Recheck INR 2 wk

## 2022-12-07 ENCOUNTER — Ambulatory Visit (HOSPITAL_COMMUNITY): Payer: Medicare Other

## 2022-12-07 ENCOUNTER — Inpatient Hospital Stay: Payer: Medicare Other

## 2022-12-11 ENCOUNTER — Inpatient Hospital Stay: Payer: Medicare Other | Attending: Hematology

## 2022-12-11 DIAGNOSIS — I2510 Atherosclerotic heart disease of native coronary artery without angina pectoris: Secondary | ICD-10-CM | POA: Insufficient documentation

## 2022-12-11 DIAGNOSIS — D473 Essential (hemorrhagic) thrombocythemia: Secondary | ICD-10-CM

## 2022-12-11 DIAGNOSIS — Z1589 Genetic susceptibility to other disease: Secondary | ICD-10-CM

## 2022-12-11 DIAGNOSIS — D75839 Thrombocytosis, unspecified: Secondary | ICD-10-CM | POA: Insufficient documentation

## 2022-12-11 LAB — CBC WITH DIFFERENTIAL/PLATELET
Abs Immature Granulocytes: 0.04 10*3/uL (ref 0.00–0.07)
Basophils Absolute: 0 10*3/uL (ref 0.0–0.1)
Basophils Relative: 1 %
Eosinophils Absolute: 0 10*3/uL (ref 0.0–0.5)
Eosinophils Relative: 1 %
HCT: 32.8 % — ABNORMAL LOW (ref 39.0–52.0)
Hemoglobin: 11 g/dL — ABNORMAL LOW (ref 13.0–17.0)
Immature Granulocytes: 2 %
Lymphocytes Relative: 12 %
Lymphs Abs: 0.3 10*3/uL — ABNORMAL LOW (ref 0.7–4.0)
MCH: 41.5 pg — ABNORMAL HIGH (ref 26.0–34.0)
MCHC: 33.5 g/dL (ref 30.0–36.0)
MCV: 123.8 fL — ABNORMAL HIGH (ref 80.0–100.0)
Monocytes Absolute: 0.3 10*3/uL (ref 0.1–1.0)
Monocytes Relative: 11 %
Neutro Abs: 1.9 10*3/uL (ref 1.7–7.7)
Neutrophils Relative %: 73 %
Platelets: 371 10*3/uL (ref 150–400)
RBC: 2.65 MIL/uL — ABNORMAL LOW (ref 4.22–5.81)
RDW: 22.6 % — ABNORMAL HIGH (ref 11.5–15.5)
WBC: 2.6 10*3/uL — ABNORMAL LOW (ref 4.0–10.5)
nRBC: 0 % (ref 0.0–0.2)

## 2022-12-11 LAB — COMPREHENSIVE METABOLIC PANEL
ALT: 25 U/L (ref 0–44)
AST: 26 U/L (ref 15–41)
Albumin: 3.8 g/dL (ref 3.5–5.0)
Alkaline Phosphatase: 79 U/L (ref 38–126)
Anion gap: 9 (ref 5–15)
BUN: 20 mg/dL (ref 8–23)
CO2: 25 mmol/L (ref 22–32)
Calcium: 9 mg/dL (ref 8.9–10.3)
Chloride: 99 mmol/L (ref 98–111)
Creatinine, Ser: 1.3 mg/dL — ABNORMAL HIGH (ref 0.61–1.24)
GFR, Estimated: 58 mL/min — ABNORMAL LOW (ref >60)
Glucose, Bld: 97 mg/dL (ref 70–99)
Potassium: 4.1 mmol/L (ref 3.5–5.1)
Sodium: 133 mmol/L — ABNORMAL LOW (ref 135–145)
Total Bilirubin: 0.9 mg/dL (ref 0.3–1.2)
Total Protein: 7.2 g/dL (ref 6.5–8.1)

## 2022-12-11 LAB — VITAMIN B12: Vitamin B-12: 729 pg/mL (ref 180–914)

## 2022-12-11 LAB — FOLATE: Folate: 10.8 ng/mL (ref >5.9)

## 2022-12-11 LAB — LACTATE DEHYDROGENASE: LDH: 280 U/L — ABNORMAL HIGH (ref 98–192)

## 2022-12-12 ENCOUNTER — Other Ambulatory Visit (HOSPITAL_COMMUNITY): Payer: Self-pay | Admitting: Family Medicine

## 2022-12-12 ENCOUNTER — Other Ambulatory Visit: Payer: Self-pay | Admitting: *Deleted

## 2022-12-12 DIAGNOSIS — I739 Peripheral vascular disease, unspecified: Secondary | ICD-10-CM

## 2022-12-12 LAB — HOMOCYSTEINE: Homocysteine: 15.6 umol/L (ref 0.0–19.2)

## 2022-12-13 ENCOUNTER — Other Ambulatory Visit (HOSPITAL_COMMUNITY): Payer: Self-pay | Admitting: Family Medicine

## 2022-12-13 LAB — COPPER, SERUM: Copper: 131 ug/dL (ref 69–132)

## 2022-12-14 ENCOUNTER — Inpatient Hospital Stay: Payer: Medicare Other | Admitting: Physician Assistant

## 2022-12-15 LAB — METHYLMALONIC ACID, SERUM: Methylmalonic Acid, Quantitative: 306 nmol/L (ref 0–378)

## 2022-12-20 ENCOUNTER — Ambulatory Visit: Payer: Medicare Other | Attending: Internal Medicine | Admitting: *Deleted

## 2022-12-20 DIAGNOSIS — Z5181 Encounter for therapeutic drug level monitoring: Secondary | ICD-10-CM | POA: Diagnosis not present

## 2022-12-20 DIAGNOSIS — Z952 Presence of prosthetic heart valve: Secondary | ICD-10-CM | POA: Diagnosis not present

## 2022-12-20 LAB — POCT INR: INR: 3.6 — AB (ref 2.0–3.0)

## 2022-12-20 NOTE — Patient Instructions (Signed)
Decrease dose to 1/2 tablet daily except for 1 tablet on Tuesdays  On Amiodarone 300mg  daily Recheck INR 3 wk

## 2022-12-23 ENCOUNTER — Encounter: Payer: Self-pay | Admitting: Oncology

## 2022-12-26 ENCOUNTER — Telehealth: Payer: Self-pay

## 2022-12-26 ENCOUNTER — Encounter: Payer: Self-pay | Admitting: Surgery

## 2022-12-26 NOTE — Telephone Encounter (Signed)
Pt had sent a few MyChart msgs requesting a CT.   Reviewed pt's chart, replied to him stating that Dr. Myra Gianotti stated that he had a CT done prior to d/c and didn't need another. He then responded c/o pain and concern for possible infection.  Called pt, two identifiers used. Pt stated that his heel sore had healed, but he had a burst blister on the outside of his foot with redness and swelling. He denied any drainage. He had finished a course of antibiotics from the ED in [place], [place]. That MD recommended the CT scan. He felt that his symptoms had improved, but now they were worsening again. Instructed him to see PCP or go to urgent care to r/o infection or cellulitis. Pt lives over an hour away and this seemed like the better option given the distance. Reviewed that the symptoms may be reperfusion, but he needed to r/o infection. Pt going to PCP. Keeping appt on 8/5 unless a more urgent need to move earlier. Confirmed understanding.

## 2022-12-26 NOTE — Telephone Encounter (Signed)
Hey, It looks like we see him for elevated platelet counts and prescribe him hydroxyurea.  I am not sure why he has messaged me because I have never seen him.  Looks like he typically sees Lurena Joiner or at least he last saw her.  I think he should probably follow-up with primary care doctor.  What do you think?

## 2022-12-28 ENCOUNTER — Inpatient Hospital Stay: Payer: Medicare Other | Attending: Hematology | Admitting: Oncology

## 2022-12-28 VITALS — BP 109/55 | HR 59 | Temp 97.5°F | Resp 18 | Ht 69.0 in | Wt 153.7 lb

## 2022-12-28 DIAGNOSIS — D473 Essential (hemorrhagic) thrombocythemia: Secondary | ICD-10-CM | POA: Insufficient documentation

## 2022-12-28 DIAGNOSIS — L97529 Non-pressure chronic ulcer of other part of left foot with unspecified severity: Secondary | ICD-10-CM | POA: Diagnosis not present

## 2022-12-28 DIAGNOSIS — Z87891 Personal history of nicotine dependence: Secondary | ICD-10-CM | POA: Insufficient documentation

## 2022-12-28 DIAGNOSIS — D75839 Thrombocytosis, unspecified: Secondary | ICD-10-CM | POA: Insufficient documentation

## 2022-12-28 MED ORDER — MUPIROCIN 2 % EX OINT: 1.0000 | TOPICAL_OINTMENT | Freq: Two times a day (BID) | CUTANEOUS | 0 refills | Status: DC

## 2022-12-28 MED ORDER — SULFAMETHOXAZOLE-TRIMETHOPRIM 800-160 MG PO TABS: 1.0000 | ORAL_TABLET | Freq: Two times a day (BID) | ORAL | 0 refills | Status: DC
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
## 2022-12-28 NOTE — Progress Notes (Signed)
Pt walked into clinic with c/o "left foot infected, toes are purple, left leg goes completely numb when sitting". Pt has been brought back to a room and the toes appeared pink and warm to touch. Pt stated they looked better than they had earlier. He has been advised to return to the Urgent Care that put him on the antibiotics for the sore on his foot. He is on day four of them and does not feel they are helping. He has been encouraged to elevate his leg, as his L foot did appear to have some swelling.He states the numbness comes and goes, which it has been doing for some time. Additionally, he has been advised to report to ED should he feel cold foot, numbness that is lingering or color changes that are not going away. Pt verbalized understanding. He has f/u with [facility] Monday.

## 2023-01-01 ENCOUNTER — Ambulatory Visit (HOSPITAL_COMMUNITY): Payer: Medicare Other

## 2023-01-01 ENCOUNTER — Encounter: Payer: Medicare Other | Admitting: Surgery

## 2023-01-11 ENCOUNTER — Ambulatory Visit: Payer: Medicare Other | Attending: Internal Medicine | Admitting: *Deleted

## 2023-01-11 DIAGNOSIS — Z5181 Encounter for therapeutic drug level monitoring: Secondary | ICD-10-CM | POA: Diagnosis not present

## 2023-01-11 DIAGNOSIS — Z952 Presence of prosthetic heart valve: Secondary | ICD-10-CM

## 2023-01-11 LAB — POCT INR: INR: 2 (ref 2.0–3.0)

## 2023-01-11 NOTE — Patient Instructions (Addendum)
Continue warfarin 1/2 tablet daily except for 1 tablet on Tuesdays  Stop Lovenox injections On Amiodarone 300mg  daily Recheck INR 1 wk

## 2023-01-18 ENCOUNTER — Ambulatory Visit: Payer: Medicare Other | Attending: Cardiology | Admitting: *Deleted

## 2023-01-18 DIAGNOSIS — Z5181 Encounter for therapeutic drug level monitoring: Secondary | ICD-10-CM

## 2023-01-18 DIAGNOSIS — Z952 Presence of prosthetic heart valve: Secondary | ICD-10-CM | POA: Diagnosis not present

## 2023-01-18 LAB — POCT INR: INR: 4.2 — AB (ref 2.0–3.0)

## 2023-01-18 NOTE — Patient Instructions (Signed)
Hold warfarin tonight then decrease dose to 1/2 tablet daily  On Amiodarone 300mg  daily Recheck INR 1 wk

## 2023-01-25 ENCOUNTER — Other Ambulatory Visit (HOSPITAL_COMMUNITY): Payer: Self-pay | Admitting: Family Medicine

## 2023-01-25 ENCOUNTER — Ambulatory Visit: Payer: Medicare Other | Attending: Internal Medicine | Admitting: *Deleted

## 2023-01-25 DIAGNOSIS — Z952 Presence of prosthetic heart valve: Secondary | ICD-10-CM | POA: Diagnosis not present

## 2023-01-25 DIAGNOSIS — Z5181 Encounter for therapeutic drug level monitoring: Secondary | ICD-10-CM

## 2023-01-25 LAB — POCT INR: POC INR: 4.3

## 2023-01-25 NOTE — Patient Instructions (Signed)
Description   Hold warfarin today and tomorrow then decrease warfarin dose to 1/2 tablet daily  On Amiodarone 300mg  daily Recheck INR 1 wk

## 2023-02-01 ENCOUNTER — Ambulatory Visit (INDEPENDENT_AMBULATORY_CARE_PROVIDER_SITE_OTHER): Payer: Medicare Other

## 2023-02-01 ENCOUNTER — Ambulatory Visit: Payer: Medicare Other | Attending: Internal Medicine | Admitting: *Deleted

## 2023-02-01 DIAGNOSIS — Z5181 Encounter for therapeutic drug level monitoring: Secondary | ICD-10-CM

## 2023-02-01 DIAGNOSIS — Z952 Presence of prosthetic heart valve: Secondary | ICD-10-CM | POA: Diagnosis not present

## 2023-02-01 DIAGNOSIS — I255 Ischemic cardiomyopathy: Secondary | ICD-10-CM

## 2023-02-01 LAB — CUP PACEART REMOTE DEVICE CHECK
Battery Remaining Longevity: 54 mo
Battery Remaining Percentage: 80 %
Brady Statistic RA Percent Paced: 1 %
Brady Statistic RV Percent Paced: 96 %
Date Time Interrogation Session: 20240905025100
HighPow Impedance: 68 Ohm
Implantable Lead Connection Status: 753985
Implantable Lead Connection Status: 753985
Implantable Lead Connection Status: 753985
Implantable Lead Implant Date: 20180105
Implantable Lead Implant Date: 20180105
Implantable Lead Implant Date: 20180105
Implantable Lead Location: 753858
Implantable Lead Location: 753859
Implantable Lead Location: 753860
Implantable Lead Model: 292
Implantable Lead Model: 4672
Implantable Lead Model: 7741
Implantable Lead Serial Number: 428973
Implantable Lead Serial Number: 801088
Implantable Lead Serial Number: 849519
Implantable Pulse Generator Implant Date: 20180105
Lead Channel Impedance Value: 548 Ohm
Lead Channel Impedance Value: 580 Ohm
Lead Channel Impedance Value: 581 Ohm
Lead Channel Pacing Threshold Amplitude: 0.8 V
Lead Channel Pacing Threshold Amplitude: 0.9 V
Lead Channel Pacing Threshold Amplitude: 1.1 V
Lead Channel Pacing Threshold Pulse Width: 0.4 ms
Lead Channel Pacing Threshold Pulse Width: 0.4 ms
Lead Channel Pacing Threshold Pulse Width: 1 ms
Lead Channel Setting Pacing Amplitude: 2 V
Lead Channel Setting Pacing Amplitude: 2 V
Lead Channel Setting Pacing Amplitude: 2.2 V
Lead Channel Setting Pacing Pulse Width: 0.4 ms
Lead Channel Setting Pacing Pulse Width: 1 ms
Lead Channel Setting Sensing Sensitivity: 0.6 mV
Lead Channel Setting Sensing Sensitivity: 1 mV
Pulse Gen Serial Number: 180139
Zone Setting Status: 755011

## 2023-02-01 LAB — POCT INR: INR: 4.4 — AB (ref 2.0–3.0)

## 2023-02-01 NOTE — Patient Instructions (Signed)
Hold warfarin today and tomorrow then decrease warfarin dose to 1/2 tablet daily  (Has still been taking 1 tablet on Tuesday) On Amiodarone 300mg  daily Recheck INR 1 wk

## 2023-02-06 NOTE — Progress Notes (Signed)
Remote ICD transmission.   

## 2023-02-14 ENCOUNTER — Other Ambulatory Visit: Payer: Self-pay | Admitting: Internal Medicine

## 2023-02-21 ENCOUNTER — Ambulatory Visit: Payer: Medicare Other | Attending: Internal Medicine | Admitting: *Deleted

## 2023-02-21 DIAGNOSIS — Z5181 Encounter for therapeutic drug level monitoring: Secondary | ICD-10-CM

## 2023-02-21 DIAGNOSIS — Z952 Presence of prosthetic heart valve: Secondary | ICD-10-CM | POA: Diagnosis not present

## 2023-02-21 LAB — POCT INR: INR: 3.6 — AB (ref 2.0–3.0)

## 2023-02-21 NOTE — Patient Instructions (Signed)
Decrease warfarin to 1/2 tablet daily except none on Wednesdays On Amiodarone 300mg  daily Recheck INR 2 wk

## 2023-02-27 ENCOUNTER — Other Ambulatory Visit: Payer: Self-pay | Admitting: Physician Assistant

## 2023-02-27 DIAGNOSIS — Z952 Presence of prosthetic heart valve: Secondary | ICD-10-CM

## 2023-02-27 NOTE — Telephone Encounter (Addendum)
Warfarin 5mg  refill S/p AVR Last INR 02/21/23 Last OV 11/22/22

## 2023-03-02 ENCOUNTER — Encounter (HOSPITAL_COMMUNITY): Payer: Self-pay | Admitting: Internal Medicine

## 2023-03-02 ENCOUNTER — Ambulatory Visit (HOSPITAL_COMMUNITY)
Admission: RE | Admit: 2023-03-02 | Discharge: 2023-03-02 | Disposition: A | Discharge status: Home / Self Care | Payer: Medicare Other | Source: Ambulatory Visit | Attending: Internal Medicine | Admitting: Internal Medicine

## 2023-03-02 VITALS — BP 112/64 | HR 68 | Wt 161.8 lb

## 2023-03-02 DIAGNOSIS — I472 Ventricular tachycardia, unspecified: Secondary | ICD-10-CM

## 2023-03-02 DIAGNOSIS — Z8679 Personal history of other diseases of the circulatory system: Secondary | ICD-10-CM | POA: Diagnosis not present

## 2023-03-02 DIAGNOSIS — F1010 Alcohol abuse, uncomplicated: Secondary | ICD-10-CM | POA: Diagnosis not present

## 2023-03-02 DIAGNOSIS — Z7982 Long term (current) use of aspirin: Secondary | ICD-10-CM | POA: Insufficient documentation

## 2023-03-02 DIAGNOSIS — Z952 Presence of prosthetic heart valve: Secondary | ICD-10-CM | POA: Insufficient documentation

## 2023-03-02 DIAGNOSIS — Z79899 Other long term (current) drug therapy: Secondary | ICD-10-CM | POA: Insufficient documentation

## 2023-03-02 DIAGNOSIS — I252 Old myocardial infarction: Secondary | ICD-10-CM | POA: Insufficient documentation

## 2023-03-02 DIAGNOSIS — I5022 Chronic systolic (congestive) heart failure: Secondary | ICD-10-CM | POA: Insufficient documentation

## 2023-03-02 DIAGNOSIS — I714 Abdominal aortic aneurysm, without rupture, unspecified: Secondary | ICD-10-CM | POA: Insufficient documentation

## 2023-03-02 DIAGNOSIS — L97529 Non-pressure chronic ulcer of other part of left foot with unspecified severity: Secondary | ICD-10-CM | POA: Insufficient documentation

## 2023-03-02 DIAGNOSIS — Z951 Presence of aortocoronary bypass graft: Secondary | ICD-10-CM | POA: Diagnosis not present

## 2023-03-02 DIAGNOSIS — Z86718 Personal history of other venous thrombosis and embolism: Secondary | ICD-10-CM | POA: Diagnosis not present

## 2023-03-02 DIAGNOSIS — F17210 Nicotine dependence, cigarettes, uncomplicated: Secondary | ICD-10-CM | POA: Diagnosis not present

## 2023-03-02 DIAGNOSIS — R609 Edema, unspecified: Secondary | ICD-10-CM | POA: Diagnosis not present

## 2023-03-02 DIAGNOSIS — Z7901 Long term (current) use of anticoagulants: Secondary | ICD-10-CM | POA: Diagnosis not present

## 2023-03-02 DIAGNOSIS — I2510 Atherosclerotic heart disease of native coronary artery without angina pectoris: Secondary | ICD-10-CM | POA: Insufficient documentation

## 2023-03-02 DIAGNOSIS — I110 Hypertensive heart disease with heart failure: Secondary | ICD-10-CM | POA: Diagnosis not present

## 2023-03-02 DIAGNOSIS — Z8674 Personal history of sudden cardiac arrest: Secondary | ICD-10-CM | POA: Insufficient documentation

## 2023-03-02 DIAGNOSIS — Z95810 Presence of automatic (implantable) cardiac defibrillator: Secondary | ICD-10-CM | POA: Diagnosis not present

## 2023-03-02 NOTE — Progress Notes (Incomplete)
Advanced Heart Failure Clinic Note    PCP: Renaldo Harrison, DO HF Cardiologist: Dr. Gala Romney   HPI: Taylor [name] is a 72 y.o. male with mechnical AVR on  chronic coumadin, h/o VF arrest s/p ICD, CAD s/p CABG, chronic systolic HF (EF 20-25%), AAA and h/o essential thrombosis.    Has h/o congenital AoV abnormality with AI. In 1994 had MI and underwent PCI (unclear artery) and mechanical AVR at [facility].    Has h/o polysubstance abuse with cocaine, ETOH and tobacco abuse. No longer doing cocaine or smoking.    Did well from heart perspective until 2017, when he had VF arrest on 05/18/16. Found to have 3v CAD and MV disease and underwent CABG x 3 with re-do mechanical AVR. Post-operatively had AF (treated with amio) and CHB requiring implantation of a BosSci CRT-D device. This is followed by Dr. Graciela Husbands. Coumadin Clinic at [facility] street follows INR. INR goal 2.0-3.0.    Echo on 12/18 showed LVEF of 25-30% stable AVR   Had CT on 12/18 for his back. Showed 3.7cm AAA. Had f/u US 05/2018 that showed mild increase in size of the known distal fusiform abdominal aortic aneurysm, measuring 4.2 cm in maximum diameter. He was referred to [facility] and he is now followed by Dr. Darrick Penna.     Most recent echo 02/2019 showed EF 20-25%. RV normal. A  He was admitted to [facility] on 1/21 w/ acute cholecystitis and sepsis. Hypotensive on admit w/ AKI and HF meds held. SCr bumped to 1.7 (baseline 1.1). Treated w/ IVFs and abx. Blood cultures negative. General surgery consulted and plans were for percutaneous cholecystostomy w/ drain, however HIDA scan was negative and he improved symptomatically w/ conservative therapy.   Echo 02/03/21: EF 25-30%, AVR looks good.   In AFL 2/23, EP unsuccessful with pace termination. Underwent DCCV to [facility] (4/23) but had ERAF about a week later.  Echo (5/23) showed EF 25-30%, grade II DD, RV ok, s/p AVR with mild AI  S/p CTI ablation 7/23 at [facility] with Dr. Jean Rosenthal. Patient called [facility] 8/23 with 3  ICD therapies. He notified Dr. Graciela Husbands who increased amio.  Saw EP 11/23, dizzy spells improved since fixing antifreeze leak in his car. Device interrogation showed recurrent MMVT and amio increased to 200 bid x 2 weeks then 200 daily.   In 6/24 EVAR for AAA requiring bilateral common femoral endarterectomies.  He had to go back on 11/10/2022 for a thrombosed left common femoral artery after endarterectomy. Then went to [facility] for left fem-pop. Course c/b LLE foot ulcer  Returns for f/u. Says foot wound starting to heal. Having LE pain. Denies CP or SOB. Has edema in left leg (surgery). No problems with meds. No ICD firings. Ambulation limited by foot wound. SBP 100/50 at home   Past Medical History:  Diagnosis Date   Abdominal aortic aneurysm (AAA) (HCC)    AICD (automatic cardioverter/defibrillator) present    Atherosclerotic heart disease of native coronary artery without angina pectoris    Chronic anticoagulation 02/18/2018   Chronic systolic heart failure (HCC) 02/02/2017   Essential thrombocythemia (HCC) 02/15/2017   Hyperlipidemia    Hypertension    Hypothyroidism    Ischemic cardiomyopathy    Leg pain, central, left 04/17/2017   Hx sciatica - pain similar; no red flag signs on hx or exam    Presence of other heart-valve replacement    Presence of permanent cardiac pacemaker    Thrombocytosis    pt states he takes a "chemo  pill" for this   Ventricular tachycardia (HCC)    Current Outpatient Medications  Medication Sig Dispense Refill   amiodarone (PACERONE) 200 MG tablet To replace Amiodarone 100 mg. Take 1/2 tablet (100 mg) PO in the morning and 1 tablets (200 mg) PO at night. 135 tablet 3   amoxicillin (AMOXIL) 500 MG capsule Take 4 capsules (2.000mg ) one hour before dental procedures. 4 capsule 0   Ascorbic Acid ([employer] VITAMIN C PO) Take 1 tablet by mouth daily.     aspirin 81 MG tablet Take 81 mg by mouth daily.     atorvastatin (LIPITOR) 40 MG tablet Take 40 mg by mouth every  evening.     carvedilol (COREG) 3.125 MG tablet Take 3.125 mg by mouth 2 (two) times daily with a meal.     Cholecalciferol (VITAMIN D) 50 MCG (2000 UT) tablet Take 2,000 Units by mouth daily.     dapagliflozin propanediol (FARXIGA) 10 MG TABS tablet Take 1 tablet (10 mg total) by mouth daily. 90 tablet 3   furosemide (LASIX) 20 MG tablet TAKE 1 TABLET(20 MG) BY MOUTH DAILY. NEED FOLLOW UP APPOINTMENT FOR MORE REFILLS 30 tablet 1   hydroxyurea (HYDREA) 500 MG capsule TAKE 2 CAPSULES BY MOUTH EVERY TUESDAY, THURSDAY, SATURDAY AND SUNDAY AND 3 CAPSULES BY MOUTH EVERY MONDAY, WEDNESDAY AND FRIDAY 75 capsule 2   levothyroxine (SYNTHROID) 75 MCG tablet Take 75 mcg by mouth daily before breakfast.     oxyCODONE-acetaminophen (PERCOCET/ROXICET) 5-325 MG tablet Take 1 tablet by mouth every 6 (six) hours as needed for moderate pain. 30 tablet 0   potassium chloride (KLOR-CON M) 10 MEQ tablet TAKE 1 TABLET(10 MEQ) BY MOUTH DAILY. NEED FOLLOW UP APPOINTMENT FOR MORE REFILLS 30 tablet 1   spironolactone (ALDACTONE) 25 MG tablet Take 1 tablet (25 mg total) by mouth every other day. 45 tablet 3   traZODone (DESYREL) 50 MG tablet Take 50 mg by mouth at bedtime.     vitamin B-12 (CYANOCOBALAMIN) 1000 MCG tablet Take 1,000 mcg by mouth daily.     warfarin (COUMADIN) 5 MG tablet Take 1/2 tablet daily by mouth or as directed by Anticoagulation Clinic. 40 tablet 1   No current facility-administered medications for this encounter.   Allergies  Allergen Reactions   Erythromycin Rash   Rosuvastatin Calcium     Muscle cramps   Simvastatin     Muscle cramps   Zithromax [Azithromycin] Rash   Social History   Socioeconomic History   Marital status: Divorced    Spouse name: Not on file   Number of children: 2   Years of education: Not on file   Highest education level: Not on file  Occupational History   Not on file  Tobacco Use   Smoking status: Former    Current packs/day: 0.00    Average packs/day: 1  pack/day for 37.0 years (37.0 ttl pk-yrs)    Types: Cigarettes    Start date: 05/26/1979    Quit date: 05/18/2016    Years since quitting: 6.7    Passive exposure: Never   Smokeless tobacco: Never  Vaping Use   Vaping status: Never Used  Substance and Sexual Activity   Alcohol use: Yes    Alcohol/week: 10.0 standard drinks of alcohol    Types: 10 Cans of beer per week    Comment: 2 beers a day max (sometimes just one a day)   Drug use: No   Sexual activity: Not on file  Other Topics Concern  Not on file  Social History Narrative   Not on file   Social Determinants of Health   Financial Resource Strain: Patient Unable To Answer (01/10/2023)   Received from [facility]   Overall Financial Resource Strain (CARDIA)    Difficulty of Paying Living Expenses: Patient unable to answer  Food Insecurity: Patient Unable To Answer (01/10/2023)   Received from [facility]   Hunger Vital Sign    Worried About Running Out of Food in the Last Year: Patient unable to answer    Ran Out of Food in the Last Year: Patient unable to answer  Transportation Needs: Patient Unable To Answer (01/10/2023)   Received from [facility]   PRAPARE - Transportation    In the past 12 months, has lack of transportation kept you from medical appointments or from getting medications?: Patient unable to answer    Lack of Transportation (Non-Medical): Patient unable to answer  Physical Activity: Not on file  Stress: Not on file  Social Connections: Not on file  Intimate Partner Violence: Not At Risk (04/03/2022)   Humiliation, Afraid, Rape, and Kick questionnaire    Fear of Current or Ex-Partner: No    Emotionally Abused: No    Physically Abused: No    Sexually Abused: No   Family History  Problem Relation Age of Onset   Hypertension Father    BP 112/64   Pulse 68   Wt 73.4 kg (161 lb 12.8 oz)   SpO2 97%   BMI 23.89 kg/m   Wt Readings from Last 3  Encounters:  03/02/23 73.4 kg (161 lb 12.8 oz)  12/28/22 69.7 kg (153 lb 11.2 oz)  11/22/22 71.6 kg (157 lb 12.8 oz)   PHYSICAL EXAM: General:  Sitting on exam table  No resp difficulty HEENT: normal Neck: supple. JVp to jaw Carotids 2+ bilat; no bruits. No lymphadenopathy or thryomegaly appreciated. Cor: PMI nondisplaced. Regular rate & rhythm.Mechincal s2. 2/6 MR  Lungs: clear Abdomen: soft, nontender, nondistended. No hepatosplenomegaly. No bruits or masses. Good bowel sounds. Extremities: no cyanosis, clubbing, rash, 2+ edema on the R 2-3+ edema + surgical wounds and boot Neuro: alert & orientedx3, cranial nerves grossly intact. moves all 4 extremities w/o difficulty. Affect pleasant  Device interrogation (personally reviewed): No VF/VT activity level 0.6hr/day Personally reviewed   ASSESSMENT & PLAN:  1. Chronic Systolic Heart Failure - Echo (05/2016 @ [facility]): EF 40% - Echo (12/18) EF 25-30% - Echo (12/20): EF 20-25%. Diffuse hypokinesis, No LVH. RV normal. Mild AI, trace MR - s/p CRT-D.  - Echo 5/23 EF 25-30% - Echo 6/24 EF 25-30%  - NYHA II-III limited by dizziness and LE wounds - Volume overloaded today on lasix 20 mg daily- will double to 40 daily x 5 days then back to 20 daily  - Off losartan 12.5 mg at bedtime and Entresto due to low BP.  - Continue Farxiga 10 mg daily. - Continue Coreg 3.125 mg bid.  - Continue spiro 12.5 mg daily.  - Labs today  2. CAD - h/o remote PCI in 1994 at [facility] - s/p LHC in 2017 in the setting of Vfib Arrest w/ 3V CAD treated w/ CABG x 3 - No s/s angina  - Continue ASA, statin and ? blocker. - Goal LDL < 70   4. Mechanical Aortic Valve - Stable on recent echo. - On coumadin. INR followed by Coumadin Clinic - No bleeding. - Reminded about SBE prophylaxis  5. H/o  VF Arrest and H/o CHB - s/p BosSci CRT-D device, followed by EP (Dr. Graciela Husbands) - Continue amiodarone 100 qam/200qpm, per EP. - VT on 04/12/22 with ATP x 1, by device  interrogation. - Followed by EP  6. PAD with AAA  - 6/24 EVAR for AAA requiring bilateral common femoral endarterectomies.  He had to go back on 11/10/2022 for a thrombosed left common femoral artery after endarterectomy. Then went to [facility] for left fem-pop. Course c/b LLE foot ulcer - Followed by [facility] and Podiatry - Continue statin + ASA  7. H/o substance abuse - Counseled on cutting back on ETOH  Arvilla Meres, MD 03/02/23

## 2023-03-02 NOTE — Patient Instructions (Signed)
Good to see you today!  INCREASE Lasix to 40mg  daily x 5 days then back to previous dose  Wear compression stocking on the right  IF fluid hasn't improved in 5 days give [place] a call  Your physician recommends that you schedule a follow-up appointment in: as scheduled   If you have any questions or concerns before your next appointment please send [place] a message through [place] or call our office at [phone].    TO LEAVE A MESSAGE FOR THE NURSE SELECT OPTION 2, PLEASE LEAVE A MESSAGE INCLUDING: YOUR NAME DATE OF BIRTH CALL BACK NUMBER REASON FOR CALL**this is important as we prioritize the call backs  YOU WILL RECEIVE A CALL BACK THE SAME DAY AS LONG AS YOU CALL BEFORE 4:00 PM  At the [facility], you and your health needs are our priority. As part of our continuing mission to provide you with exceptional heart care, we have created designated Provider Care Teams. These Care Teams include your primary Cardiologist (physician) and Advanced Practice Providers (APPs- Physician Assistants and Nurse Practitioners) who all work together to provide you with the care you need, when you need it.   You may see any of the following providers on your designated Care Team at your next follow up: Dr Arvilla Meres Dr Marca Ancona Dr. Dorthula Nettles Dr. Clearnce Hasten Amy Filbert Schilder, NP Robbie Lis, [place] [facility] [place], [place] Brynda Peon, NP [place] Lee, NP Karle Plumber, PharmD   Please be sure to bring in all your medications bottles to every appointment.    Thank you for choosing [facility]

## 2023-03-07 ENCOUNTER — Ambulatory Visit: Payer: Medicare Other | Attending: Internal Medicine | Admitting: *Deleted

## 2023-03-07 DIAGNOSIS — Z5181 Encounter for therapeutic drug level monitoring: Secondary | ICD-10-CM | POA: Diagnosis not present

## 2023-03-07 DIAGNOSIS — Z952 Presence of prosthetic heart valve: Secondary | ICD-10-CM | POA: Diagnosis not present

## 2023-03-07 LAB — POCT INR: INR: 1.5 — AB (ref 2.0–3.0)

## 2023-03-07 NOTE — Patient Instructions (Addendum)
Pt states he has been taking 1/2 tablet daily.  Did not decrease dose as instructed Take warfarin 1 tablet tonight then resume 1/2 tablet daily  On Amiodarone 300mg  daily Recheck INR 2 wk

## 2023-03-13 ENCOUNTER — Other Ambulatory Visit: Payer: Self-pay | Admitting: Internal Medicine

## 2023-03-14 ENCOUNTER — Telehealth: Payer: Self-pay | Admitting: Cardiology

## 2023-03-14 NOTE — Telephone Encounter (Signed)
Pt c/o medication issue:  1. Name of Medication: furosemide (LASIX) 20 MG tablet   2. How are you currently taking this medication (dosage and times per day)?   3. Are you having a reaction (difficulty breathing--STAT)?   4. What is your medication issue? Patient's pharmacy is calling to get clarification on this medication. She states that patient informed pharmacy that the medication was changed to 40 mg from 20 mg.   Informed pharmacy that Dr. Gala Romney did this refill, but I would send message to triage just in case.

## 2023-03-15 ENCOUNTER — Other Ambulatory Visit: Payer: Self-pay | Admitting: Internal Medicine

## 2023-03-15 ENCOUNTER — Telehealth (HOSPITAL_COMMUNITY): Payer: Self-pay | Admitting: Cardiology

## 2023-03-15 NOTE — Telephone Encounter (Signed)
Pt left VM on triage line reports increased dose of lasix x 5 days did not work. Was told at ov to return call for additional medications    Returned call for details Weight,symptoms etc  LMOM

## 2023-03-16 NOTE — Telephone Encounter (Signed)
Pt reports swelling is mildy better  Still haS swelling in L leg, R leg has gotten better Denies SOB and weight stable   Again reports he was told med change would be made if this didn't get better  Please advise

## 2023-03-21 ENCOUNTER — Ambulatory Visit: Payer: Medicare Other | Attending: Internal Medicine | Admitting: *Deleted

## 2023-03-21 DIAGNOSIS — Z5181 Encounter for therapeutic drug level monitoring: Secondary | ICD-10-CM | POA: Diagnosis not present

## 2023-03-21 DIAGNOSIS — Z952 Presence of prosthetic heart valve: Secondary | ICD-10-CM

## 2023-03-21 LAB — POCT INR: INR: 5.5 — AB (ref 2.0–3.0)

## 2023-03-21 NOTE — Patient Instructions (Signed)
Had bad nosebleed last night. Went to [facility] ED today.  INR was 5.6.  Was given Vit K 2.5mg  po.   Hold warfarin tonight and tomorrow then resume 1/2 tablet daily. On Amiodarone 300mg  daily Recheck INR 1 wk

## 2023-03-26 ENCOUNTER — Ambulatory Visit: Payer: Medicare Other | Attending: Internal Medicine | Admitting: *Deleted

## 2023-03-26 DIAGNOSIS — Z952 Presence of prosthetic heart valve: Secondary | ICD-10-CM | POA: Diagnosis not present

## 2023-03-26 DIAGNOSIS — Z5181 Encounter for therapeutic drug level monitoring: Secondary | ICD-10-CM

## 2023-03-26 LAB — POCT INR: INR: 1.7 — AB (ref 2.0–3.0)

## 2023-03-26 NOTE — Telephone Encounter (Signed)
Pt aware  Reports weight has gone back down Will continue current medications Appreciative of returned call

## 2023-03-26 NOTE — Patient Instructions (Signed)
Take warfarin 1 tablet tonight then resume 1/2 tablet daily. On Amiodarone 300mg  daily Recheck INR 1 wk

## 2023-03-27 ENCOUNTER — Encounter: Payer: Self-pay | Admitting: Internal Medicine

## 2023-04-04 ENCOUNTER — Other Ambulatory Visit: Payer: Self-pay

## 2023-04-04 ENCOUNTER — Ambulatory Visit: Payer: Medicare Other | Attending: Internal Medicine | Admitting: *Deleted

## 2023-04-04 DIAGNOSIS — Z5181 Encounter for therapeutic drug level monitoring: Secondary | ICD-10-CM | POA: Diagnosis not present

## 2023-04-04 DIAGNOSIS — Z952 Presence of prosthetic heart valve: Secondary | ICD-10-CM | POA: Diagnosis not present

## 2023-04-04 DIAGNOSIS — D473 Essential (hemorrhagic) thrombocythemia: Secondary | ICD-10-CM

## 2023-04-04 LAB — POCT INR: INR: 3.1 — AB (ref 2.0–3.0)

## 2023-04-04 NOTE — Patient Instructions (Signed)
Continue warfarin 1/2 tablet daily. Eat salad today Has 1 wk of keflex left On Amiodarone 300mg  daily Recheck INR 2 wk

## 2023-04-04 NOTE — Progress Notes (Signed)
Lab orders entered

## 2023-04-05 ENCOUNTER — Inpatient Hospital Stay: Payer: Medicare Other

## 2023-04-05 ENCOUNTER — Inpatient Hospital Stay: Payer: Medicare Other | Attending: Hematology | Admitting: *Deleted

## 2023-04-05 ENCOUNTER — Ambulatory Visit (HOSPITAL_COMMUNITY): Payer: Medicare Other

## 2023-04-05 DIAGNOSIS — D75839 Thrombocytosis, unspecified: Secondary | ICD-10-CM | POA: Diagnosis present

## 2023-04-05 DIAGNOSIS — E119 Type 2 diabetes mellitus without complications: Secondary | ICD-10-CM | POA: Diagnosis not present

## 2023-04-05 DIAGNOSIS — E785 Hyperlipidemia, unspecified: Secondary | ICD-10-CM | POA: Diagnosis not present

## 2023-04-05 DIAGNOSIS — I119 Hypertensive heart disease without heart failure: Secondary | ICD-10-CM

## 2023-04-05 DIAGNOSIS — D473 Essential (hemorrhagic) thrombocythemia: Secondary | ICD-10-CM

## 2023-04-05 LAB — CBC WITH DIFFERENTIAL/PLATELET
Abs Immature Granulocytes: 0.03 10*3/uL (ref 0.00–0.07)
Basophils Absolute: 0 10*3/uL (ref 0.0–0.1)
Basophils Relative: 1 %
Eosinophils Absolute: 0 10*3/uL (ref 0.0–0.5)
Eosinophils Relative: 1 %
HCT: 26.8 % — ABNORMAL LOW (ref 39.0–52.0)
Hemoglobin: 8.9 g/dL — ABNORMAL LOW (ref 13.0–17.0)
Immature Granulocytes: 1 %
Lymphocytes Relative: 12 %
Lymphs Abs: 0.4 10*3/uL — ABNORMAL LOW (ref 0.7–4.0)
MCH: 44.5 pg — ABNORMAL HIGH (ref 26.0–34.0)
MCHC: 33.2 g/dL (ref 30.0–36.0)
MCV: 134 fL — ABNORMAL HIGH (ref 80.0–100.0)
Monocytes Absolute: 0.2 10*3/uL (ref 0.1–1.0)
Monocytes Relative: 6 %
Neutro Abs: 2.5 10*3/uL (ref 1.7–7.7)
Neutrophils Relative %: 79 %
Platelets: 426 10*3/uL — ABNORMAL HIGH (ref 150–400)
RBC: 2 MIL/uL — ABNORMAL LOW (ref 4.22–5.81)
RDW: 15.7 % — ABNORMAL HIGH (ref 11.5–15.5)
WBC: 3.1 10*3/uL — ABNORMAL LOW (ref 4.0–10.5)
nRBC: 0 % (ref 0.0–0.2)

## 2023-04-05 LAB — COMPREHENSIVE METABOLIC PANEL
ALT: 16 U/L (ref 0–44)
AST: 22 U/L (ref 15–41)
Albumin: 3.7 g/dL (ref 3.5–5.0)
Alkaline Phosphatase: 56 U/L (ref 38–126)
Anion gap: 11 (ref 5–15)
BUN: 60 mg/dL — ABNORMAL HIGH (ref 8–23)
CO2: 20 mmol/L — ABNORMAL LOW (ref 22–32)
Calcium: 8.8 mg/dL — ABNORMAL LOW (ref 8.9–10.3)
Chloride: 103 mmol/L (ref 98–111)
Creatinine, Ser: 2.49 mg/dL — ABNORMAL HIGH (ref 0.61–1.24)
GFR, Estimated: 27 mL/min — ABNORMAL LOW (ref >60)
Glucose, Bld: 138 mg/dL — ABNORMAL HIGH (ref 70–99)
Potassium: 4.6 mmol/L (ref 3.5–5.1)
Sodium: 134 mmol/L — ABNORMAL LOW (ref 135–145)
Total Bilirubin: 0.6 mg/dL (ref <1.2)
Total Protein: 7.1 g/dL (ref 6.5–8.1)

## 2023-04-05 LAB — LIPID PANEL
Cholesterol: 107 mg/dL (ref 0–200)
HDL: 42 mg/dL (ref >40)
LDL Cholesterol: 46 mg/dL (ref 0–99)
Total CHOL/HDL Ratio: 2.5 {ratio}
Triglycerides: 96 mg/dL (ref <150)
VLDL: 19 mg/dL (ref 0–40)

## 2023-04-05 LAB — HEMOGLOBIN A1C
Hgb A1c MFr Bld: 5 % (ref 4.8–5.6)
Mean Plasma Glucose: 96.8 mg/dL

## 2023-04-05 LAB — T4, FREE: Free T4: 1.03 ng/dL (ref 0.61–1.12)

## 2023-04-05 LAB — LACTATE DEHYDROGENASE: LDH: 277 U/L — ABNORMAL HIGH (ref 98–192)

## 2023-04-05 LAB — TSH: TSH: 11.974 u[IU]/mL — ABNORMAL HIGH (ref 0.350–4.500)

## 2023-04-05 NOTE — Progress Notes (Signed)
Orders received from [facility], DO.  Orders placed and will be drawn today.

## 2023-04-06 LAB — T3, FREE: T3, Free: 1.7 pg/mL — ABNORMAL LOW (ref 2.0–4.4)

## 2023-04-11 ENCOUNTER — Other Ambulatory Visit (HOSPITAL_COMMUNITY): Payer: Self-pay | Admitting: Family Medicine

## 2023-04-13 ENCOUNTER — Inpatient Hospital Stay: Payer: Medicare Other | Admitting: Oncology

## 2023-04-17 ENCOUNTER — Other Ambulatory Visit (HOSPITAL_COMMUNITY): Payer: Self-pay

## 2023-04-17 MED ORDER — FUROSEMIDE 20 MG PO TABS: 20.0000 mg | ORAL_TABLET | Freq: Every day | ORAL | 0 refills | Status: DC

## 2023-04-18 ENCOUNTER — Ambulatory Visit: Payer: Medicare Other | Attending: Internal Medicine

## 2023-04-18 DIAGNOSIS — Z952 Presence of prosthetic heart valve: Secondary | ICD-10-CM | POA: Diagnosis not present

## 2023-04-18 DIAGNOSIS — I5022 Chronic systolic (congestive) heart failure: Secondary | ICD-10-CM | POA: Diagnosis not present

## 2023-04-18 DIAGNOSIS — Z5181 Encounter for therapeutic drug level monitoring: Secondary | ICD-10-CM

## 2023-04-18 LAB — POCT INR: INR: 2.2 (ref 2.0–3.0)

## 2023-04-18 NOTE — Patient Instructions (Signed)
Description   Continue warfarin 1/2 tablet daily. Eat salad today Currently on Amoxicillin On Amiodarone 300mg  daily Recheck INR 3 wk

## 2023-05-01 NOTE — Progress Notes (Signed)
Advanced [facility] Failure Clinic Note    PCP: Renaldo Harrison, DO HF Cardiologist: Dr. Gala Romney   HPI: Taylor [name] is a 72 y.o.. male with mechnical AVR on chronic coumadin, h/o VF arrest s/p ICD, CAD s/p CABG, chronic systolic HF (EF 20-25%), AAA and h/o essential thrombosis.    Has h/o congenital AoV abnormality with AI. In 1994 had MI and underwent PCI (unclear artery) and mechanical AVR at [facility].    Has h/o polysubstance abuse with cocaine, ETOH and tobacco abuse. No longer doing cocaine or smoking.    Did well from heart perspective until 2017, when he had VF arrest on 05/18/16. Found to have 3v CAD and MV disease and underwent CABG x 3 with re-do mechanical AVR. Post-operatively had AF (treated with amio) and CHB requiring implantation of a BosSci CRT-D device. This is followed by Dr. Graciela Husbands. Coumadin Clinic at [facility] follows INR. INR goal 2.0-3.0.    Echo on 12/18 showed LVEF of 25-30% stable AVR   Had CT on 12/18 for his back. Showed 3.7cm AAA. Had f/u US 05/2018 that showed mild increase in size of the known distal fusiform abdominal aortic aneurysm, measuring 4.2 cm in maximum diameter. He was referred to [facility] and he is now followed by Dr. Darrick Penna.     Echo 10/20 showed EF 20-25%. RV normal. A  Admitted 1/21 w/ acute cholecystitis and sepsis. Hypotensive on admit w/ AKI and HF meds held. SCr bumped to 1.7 (baseline 1.1). Treated w/ IVFs and abx. Blood cultures negative. General surgery consulted and plans were for percutaneous cholecystostomy w/ drain, however HIDA scan was negative and he improved symptomatically w/ conservative therapy.   Echo 02/03/21: EF 25-30%, AVR looks good.   In AFL 2/23, EP unsuccessful with pace termination. Underwent DCCV to [facility] (4/23) but had ERAF about a week later.  Echo (5/23) showed EF 25-30%, grade II DD, RV ok, s/p AVR with mild AI  S/p CTI ablation 7/23 at [facility] with Dr. Jean Rosenthal. Patient called [facility] 8/23 with 3 ICD therapies. He notified Dr.  Graciela Husbands who increased amio.  Saw EP 11/23, dizzy spells improved since fixing antifreeze leak in his car. Device interrogation showed recurrent MMVT and amio increased to 200 bid x 2 weeks then 200 daily.   In 6/24 EVAR for AAA requiring bilateral common femoral endarterectomies.  He had to go back on 11/10/2022 for a thrombosed left common femoral artery after endarterectomy. Then went to [facility] for left fem-pop. Course c/b LLE foot ulcer  Today he returns for HF follow up with his wife. Overall feeling fine. He has SOB walking on flat ground, can get around OK but has to stop and rest. IN ortho shoe with chronic LLE wounds, followed by Podiatry and Vascular at [facility] Says wounds are slowly healing. Chronic LLE swelling. Denies palpitations, CP, dizziness, or PND/Orthopnea. Appetite ok. No fever or chills. Weight at home 148-149 pounds. Taking all medications, has been off losartan x years. BP at home 110/50s. Quit smoking in 2017, drinks 2 beers/day, no drugs. Has vascular follow up end of this month.   Past Medical History:  Diagnosis Date   Abdominal aortic aneurysm (AAA) (HCC)    AICD (automatic cardioverter/defibrillator) present    Atherosclerotic heart disease of native coronary artery without angina pectoris    Chronic anticoagulation 02/18/2018   Chronic systolic heart failure (HCC) 02/02/2017   Essential thrombocythemia (HCC) 02/15/2017   Hyperlipidemia    Hypertension    Hypothyroidism    Ischemic  cardiomyopathy    Leg pain, central, left 04/17/2017   Hx sciatica - pain similar; no red flag signs on hx or exam    Presence of other heart-valve replacement    Presence of permanent cardiac pacemaker    Thrombocytosis    pt states he takes a "chemo pill" for this   Ventricular tachycardia (HCC)    Current Outpatient Medications  Medication Sig Dispense Refill   amiodarone (PACERONE) 200 MG tablet To replace Amiodarone 100 mg. Take 1/2 tablet (100 mg) PO in the morning and 1 tablets  (200 mg) PO at night. 135 tablet 3   amoxicillin (AMOXIL) 500 MG capsule Take 4 capsules (2.000mg ) one hour before dental procedures. 4 capsule 0   Ascorbic Acid ([employer] VITAMIN C PO) Take 1 tablet by mouth daily.     aspirin 81 MG tablet Take 81 mg by mouth daily.     carvedilol (COREG) 3.125 MG tablet Take 1 tablet (3.125 mg total) by mouth 2 (two) times daily with a meal. 180 tablet 3   Cholecalciferol (VITAMIN D) 50 MCG (2000 UT) tablet Take 2,000 Units by mouth daily.     dapagliflozin propanediol (FARXIGA) 10 MG TABS tablet Take 1 tablet (10 mg total) by mouth daily. 90 tablet 3   furosemide (LASIX) 20 MG tablet Take 1 tablet (20 mg total) by mouth daily. 90 tablet 0   hydroxyurea (HYDREA) 500 MG capsule TAKE 2 CAPSULES BY MOUTH EVERY TUESDAY, THURSDAY, SATURDAY AND SUNDAY AND 3 CAPSULES BY MOUTH EVERY MONDAY, WEDNESDAY AND FRIDAY 75 capsule 2   levothyroxine (SYNTHROID) 75 MCG tablet Take 75 mcg by mouth daily before breakfast.     potassium chloride (KLOR-CON M) 10 MEQ tablet TAKE 1 TABLET(10 MEQ) BY MOUTH DAILY. NEED FOLLOW UP APPOINTMENT FOR MORE REFILLS 30 tablet 1   spironolactone (ALDACTONE) 25 MG tablet Take 1 tablet (25 mg total) by mouth every other day. 45 tablet 3   traZODone (DESYREL) 50 MG tablet Take 50 mg by mouth at bedtime.     vitamin B-12 (CYANOCOBALAMIN) 1000 MCG tablet Take 1,000 mcg by mouth daily.     warfarin (COUMADIN) 5 MG tablet Take 1/2 tablet daily by mouth or as directed by Anticoagulation Clinic. 40 tablet 1   No current facility-administered medications for this encounter.   Allergies  Allergen Reactions   Erythromycin Rash   Rosuvastatin Calcium     Muscle cramps   Simvastatin     Muscle cramps   Zithromax [Azithromycin] Rash   Social History   Socioeconomic History   Marital status: Divorced    Spouse name: Not on file   Number of children: 2   Years of education: Not on file   Highest education level: Not on file  Occupational History   Not  on file  Tobacco Use   Smoking status: Former    Current packs/day: 0.00    Average packs/day: 1 pack/day for 37.0 years (37.0 ttl pk-yrs)    Types: Cigarettes    Start date: 05/26/1979    Quit date: 05/18/2016    Years since quitting: 6.9    Passive exposure: Never   Smokeless tobacco: Never  Vaping Use   Vaping status: Never Used  Substance and Sexual Activity   Alcohol use: Yes    Alcohol/week: 10.0 standard drinks of alcohol    Types: 10 Cans of beer per week    Comment: 2 beers a day max (sometimes just one a day)   Drug use: No  Sexual activity: Not on file  Other Topics Concern   Not on file  Social History Narrative   Not on file   Social Determinants of Health   Financial Resource Strain: Patient Unable To Answer (01/10/2023)   Received from [facility]   Overall Financial Resource Strain (CARDIA)    Difficulty of Paying Living Expenses: Patient unable to answer  Food Insecurity: Patient Unable To Answer (01/10/2023)   Received from [facility]   Hunger Vital Sign    Worried About Running Out of Food in the Last Year: Patient unable to answer    Ran Out of Food in the Last Year: Patient unable to answer  Transportation Needs: Patient Unable To Answer (01/10/2023)   Received from [facility]   PRAPARE - Transportation    In the past 12 months, has lack of transportation kept you from medical appointments or from getting medications?: Patient unable to answer    Lack of Transportation (Non-Medical): Patient unable to answer  Physical Activity: Not on file  Stress: Not on file  Social Connections: Not on file  Intimate Partner Violence: Not At Risk (04/03/2022)   Humiliation, Afraid, Rape, and Kick questionnaire    Fear of Current or Ex-Partner: No    Emotionally Abused: No    Physically Abused: No    Sexually Abused: No   Family History  Problem Relation Age of Onset   Hypertension Father    BP 132/68    Pulse 62   Wt 71.6 kg (157 lb 12.8 oz)   SpO2 98%   BMI 23.30 kg/m   Wt Readings from Last 3 Encounters:  05/02/23 71.6 kg (157 lb 12.8 oz)  03/02/23 73.4 kg (161 lb 12.8 oz)  12/28/22 69.7 kg (153 lb 11.2 oz)   PHYSICAL EXAM: General:  NAD. No resp difficulty, walked into clinic, chronically-ill appearing HEENT: Normal Neck: Supple. No JVD. Carotids 2+ bilat; no bruits. No lymphadenopathy or thryomegaly appreciated. Cor: PMI nondisplaced. Regular rate & rhythm. No rubs, gallops, mechanical S2 Lungs: Clear, diminished in bases Abdomen: Soft, nontender, nondistended. No hepatosplenomegaly. No bruits or masses. Good bowel sounds. Extremities: No cyanosis, clubbing, rash, 1+ LLE edema, L foot in ortho shoe Neuro: Alert & oriented x 3, cranial nerves grossly intact. Moves all 4 extremities w/o difficulty. Affect pleasant.  Device interrogation (personally reviewed): No Vt as of 01/01/23, 94% RV paced, no AF  ASSESSMENT & PLAN: 1. Chronic Systolic Heart Failure - Echo (05/2016 @ [facility]): EF 40% - Echo (12/18) EF 25-30% - Echo (12/20): EF 20-25%. Diffuse hypokinesis, No LVH. RV normal. Mild AI, trace MR - s/p CRT-D.  - Echo 5/23 EF 25-30% - Echo 6/24 EF 25-30%  - NYHA II-III, limited be LE wound - Continue Lasix 20 mg daily + 10 KCL daily. OK to take extra PRN - Continue Farxiga 10 mg daily. - Continue Coreg 3.125 mg bid.  - Continue spironolactone 25 mg every other day  - Off losartan with CKD and low BP - Labs today  2. CAD - h/o remote PCI in 1994 at [facility] - s/p LHC in 2017 in the setting of Vfib Arrest w/ 3V CAD treated w/ CABG x 3 - No s/s angina  - Continue ASA, statin and ? blocker. - Goal LDL < 70   4. Mechanical Aortic Valve - Stable on recent echo. - On coumadin. INR followed by Coumadin Clinic - No bleeding. - Reminded about SBE prophylaxis  5. H/o  VF Arrest and H/o CHB - s/p BoSci CRT-D device, followed by EP (Dr. Graciela Husbands) - Continue amiodarone 100 q am/200 q pm,  per EP. - No VT on device interrogation (since 01/01/23) - Followed by EP  6. PAD with AAA  - 6/24 EVAR for AAA requiring bilateral common femoral endarterectomies.  He had to go back on 11/10/2022 for a thrombosed left common femoral artery after endarterectomy. Then went to [facility] for left fem-pop. Course c/b LLE foot ulcer - Followed by [facility] and Podiatry - Continue statin + ASA  7. H/o substance abuse - Counseled on cutting back on ETOH  Follow up in 3 months with APP  Jacklynn Ganong, FNP 05/02/23

## 2023-05-02 ENCOUNTER — Encounter (HOSPITAL_COMMUNITY): Payer: Self-pay

## 2023-05-02 ENCOUNTER — Ambulatory Visit (HOSPITAL_COMMUNITY)
Admission: RE | Admit: 2023-05-02 | Discharge: 2023-05-02 | Disposition: A | Discharge status: Home / Self Care | Payer: Medicare Other | Source: Ambulatory Visit | Attending: Family Medicine | Admitting: Family Medicine

## 2023-05-02 VITALS — BP 132/68 | HR 62 | Wt 157.8 lb

## 2023-05-02 DIAGNOSIS — Z8679 Personal history of other diseases of the circulatory system: Secondary | ICD-10-CM | POA: Insufficient documentation

## 2023-05-02 DIAGNOSIS — I5022 Chronic systolic (congestive) heart failure: Secondary | ICD-10-CM | POA: Diagnosis present

## 2023-05-02 DIAGNOSIS — N189 Chronic kidney disease, unspecified: Secondary | ICD-10-CM | POA: Insufficient documentation

## 2023-05-02 DIAGNOSIS — M7989 Other specified soft tissue disorders: Secondary | ICD-10-CM | POA: Diagnosis not present

## 2023-05-02 DIAGNOSIS — Z87891 Personal history of nicotine dependence: Secondary | ICD-10-CM | POA: Diagnosis not present

## 2023-05-02 DIAGNOSIS — Z5181 Encounter for therapeutic drug level monitoring: Secondary | ICD-10-CM

## 2023-05-02 DIAGNOSIS — Z7982 Long term (current) use of aspirin: Secondary | ICD-10-CM | POA: Diagnosis not present

## 2023-05-02 DIAGNOSIS — Z8674 Personal history of sudden cardiac arrest: Secondary | ICD-10-CM | POA: Diagnosis not present

## 2023-05-02 DIAGNOSIS — I472 Ventricular tachycardia, unspecified: Secondary | ICD-10-CM

## 2023-05-02 DIAGNOSIS — Z952 Presence of prosthetic heart valve: Secondary | ICD-10-CM | POA: Diagnosis not present

## 2023-05-02 DIAGNOSIS — I252 Old myocardial infarction: Secondary | ICD-10-CM | POA: Insufficient documentation

## 2023-05-02 DIAGNOSIS — I130 Hypertensive heart and chronic kidney disease with heart failure and stage 1 through stage 4 chronic kidney disease, or unspecified chronic kidney disease: Secondary | ICD-10-CM | POA: Diagnosis not present

## 2023-05-02 DIAGNOSIS — Z951 Presence of aortocoronary bypass graft: Secondary | ICD-10-CM | POA: Diagnosis not present

## 2023-05-02 DIAGNOSIS — Z7901 Long term (current) use of anticoagulants: Secondary | ICD-10-CM | POA: Diagnosis not present

## 2023-05-02 DIAGNOSIS — F1911 Other psychoactive substance abuse, in remission: Secondary | ICD-10-CM

## 2023-05-02 DIAGNOSIS — I714 Abdominal aortic aneurysm, without rupture, unspecified: Secondary | ICD-10-CM | POA: Diagnosis not present

## 2023-05-02 DIAGNOSIS — I2510 Atherosclerotic heart disease of native coronary artery without angina pectoris: Secondary | ICD-10-CM | POA: Diagnosis not present

## 2023-05-02 DIAGNOSIS — Z4502 Encounter for adjustment and management of automatic implantable cardiac defibrillator: Secondary | ICD-10-CM | POA: Insufficient documentation

## 2023-05-02 DIAGNOSIS — Z79899 Other long term (current) drug therapy: Secondary | ICD-10-CM | POA: Insufficient documentation

## 2023-05-02 DIAGNOSIS — I739 Peripheral vascular disease, unspecified: Secondary | ICD-10-CM | POA: Diagnosis not present

## 2023-05-02 LAB — BASIC METABOLIC PANEL
Anion gap: 9 (ref 5–15)
BUN: 32 mg/dL — ABNORMAL HIGH (ref 8–23)
CO2: 20 mmol/L — ABNORMAL LOW (ref 22–32)
Calcium: 8.9 mg/dL (ref 8.9–10.3)
Chloride: 104 mmol/L (ref 98–111)
Creatinine, Ser: 1.49 mg/dL — ABNORMAL HIGH (ref 0.61–1.24)
GFR, Estimated: 49 mL/min — ABNORMAL LOW (ref >60)
Glucose, Bld: 81 mg/dL (ref 70–99)
Potassium: 4.3 mmol/L (ref 3.5–5.1)
Sodium: 133 mmol/L — ABNORMAL LOW (ref 135–145)

## 2023-05-02 LAB — BRAIN NATRIURETIC PEPTIDE: B Natriuretic Peptide: 753.7 pg/mL — ABNORMAL HIGH (ref 0.0–100.0)

## 2023-05-02 NOTE — Patient Instructions (Signed)
Medication Changes:  No Changes In Medications at this time.   Lab Work:  Labs done today, your results will be available in MyChart, we will contact you for abnormal readings.  Follow-Up in: 3 MONTHS AS SCHEDULED WITH APP   At the [facility], you and your health needs are our priority. We have a designated team specialized in the treatment of Heart Failure. This Care Team includes your primary Heart Failure Specialized Cardiologist (physician), Advanced Practice Providers (APPs- Physician Assistants and Nurse Practitioners), and Pharmacist who all work together to provide you with the care you need, when you need it.   You may see any of the following providers on your designated Care Team at your next follow up:  Dr. Arvilla Meres Dr. Marca Ancona Dr. Dorthula Nettles Dr. Theresia Bough Tonye Becket, NP Robbie Lis, [place] [facility] [place], [place] Brynda Peon, NP [place] Lee, NP Karle Plumber, PharmD   Please be sure to bring in all your medications bottles to every appointment.   Need to Contact us:  If you have any questions or concerns before your next appointment please send [place] a message through [place] or call our office at [phone].    TO LEAVE A MESSAGE FOR THE NURSE SELECT OPTION 2, PLEASE LEAVE A MESSAGE INCLUDING: YOUR NAME DATE OF BIRTH CALL BACK NUMBER REASON FOR CALL**this is important as we prioritize the call backs  YOU WILL RECEIVE A CALL BACK THE SAME DAY AS LONG AS YOU CALL BEFORE 4:00 PM

## 2023-05-03 ENCOUNTER — Telehealth (HOSPITAL_COMMUNITY): Payer: Self-pay

## 2023-05-03 ENCOUNTER — Ambulatory Visit (INDEPENDENT_AMBULATORY_CARE_PROVIDER_SITE_OTHER): Payer: Medicare Other

## 2023-05-03 DIAGNOSIS — I255 Ischemic cardiomyopathy: Secondary | ICD-10-CM

## 2023-05-03 DIAGNOSIS — I5022 Chronic systolic (congestive) heart failure: Secondary | ICD-10-CM

## 2023-05-03 LAB — CUP PACEART REMOTE DEVICE CHECK
Battery Remaining Longevity: 48 mo
Battery Remaining Percentage: 68 %
Brady Statistic RA Percent Paced: 0 %
Brady Statistic RV Percent Paced: 94 %
Date Time Interrogation Session: 20241205004300
HighPow Impedance: 56 Ohm
Implantable Lead Connection Status: 753985
Implantable Lead Connection Status: 753985
Implantable Lead Connection Status: 753985
Implantable Lead Implant Date: 20180105
Implantable Lead Implant Date: 20180105
Implantable Lead Implant Date: 20180105
Implantable Lead Location: 753858
Implantable Lead Location: 753859
Implantable Lead Location: 753860
Implantable Lead Model: 292
Implantable Lead Model: 4672
Implantable Lead Model: 7741
Implantable Lead Serial Number: 428973
Implantable Lead Serial Number: 801088
Implantable Lead Serial Number: 849519
Implantable Pulse Generator Implant Date: 20180105
Lead Channel Impedance Value: 506 Ohm
Lead Channel Impedance Value: 539 Ohm
Lead Channel Impedance Value: 566 Ohm
Lead Channel Pacing Threshold Amplitude: 0.8 V
Lead Channel Pacing Threshold Amplitude: 0.9 V
Lead Channel Pacing Threshold Amplitude: 1.1 V
Lead Channel Pacing Threshold Pulse Width: 0.4 ms
Lead Channel Pacing Threshold Pulse Width: 0.4 ms
Lead Channel Pacing Threshold Pulse Width: 1 ms
Lead Channel Setting Pacing Amplitude: 2 V
Lead Channel Setting Pacing Amplitude: 2 V
Lead Channel Setting Pacing Amplitude: 2.2 V
Lead Channel Setting Pacing Pulse Width: 0.4 ms
Lead Channel Setting Pacing Pulse Width: 1 ms
Lead Channel Setting Sensing Sensitivity: 0.6 mV
Lead Channel Setting Sensing Sensitivity: 1 mV
Pulse Gen Serial Number: 180139
Zone Setting Status: 755011

## 2023-05-03 MED ORDER — POTASSIUM CHLORIDE CRYS ER 10 MEQ PO TBCR: 20.0000 meq | EXTENDED_RELEASE_TABLET | Freq: Every day | ORAL | 11 refills | Status: DC

## 2023-05-03 MED ORDER — FUROSEMIDE 20 MG PO TABS: 40.0000 mg | ORAL_TABLET | Freq: Every day | ORAL | 11 refills | Status: DC

## 2023-05-03 NOTE — Telephone Encounter (Signed)
Patient's lasix and Kcl medications has been increased and updated in patient's chart. In addition, pt's labs placed and his appointment scheduled. Pt aware, agreeable, and verbalized understanding.

## 2023-05-03 NOTE — Telephone Encounter (Signed)
-----   Message from [place] sent at 05/02/2023  4:03 PM EST ----- BNP up  Increase Lasix to 40 mg daily, increase KCL to 20 daily.  Repeat BMET in 10-14 days

## 2023-05-04 ENCOUNTER — Encounter (HOSPITAL_COMMUNITY): Payer: Self-pay

## 2023-05-09 ENCOUNTER — Ambulatory Visit: Payer: Medicare Other | Attending: Internal Medicine | Admitting: *Deleted

## 2023-05-09 DIAGNOSIS — Z952 Presence of prosthetic heart valve: Secondary | ICD-10-CM

## 2023-05-09 DIAGNOSIS — Z5181 Encounter for therapeutic drug level monitoring: Secondary | ICD-10-CM | POA: Diagnosis not present

## 2023-05-09 LAB — POCT INR: INR: 6.8 — AB (ref 2.0–3.0)

## 2023-05-09 NOTE — Patient Instructions (Addendum)
Hold x 3 days (Wed,Thurs,Fri) then decrease dose to 1/2 tablet daily except none on Mondays Eat salad today Currently on Keflex x 9 days On Amiodarone 300mg  daily Recheck INR 1 wk Pt denies S/S of bleeding.  Bleeding and fall precautions discussed with pt and he verbalized understanding.

## 2023-05-14 ENCOUNTER — Ambulatory Visit: Payer: Medicare Other | Attending: Internal Medicine | Admitting: *Deleted

## 2023-05-14 ENCOUNTER — Encounter: Payer: Self-pay | Admitting: Cardiology

## 2023-05-14 DIAGNOSIS — Z952 Presence of prosthetic heart valve: Secondary | ICD-10-CM

## 2023-05-14 DIAGNOSIS — Z5181 Encounter for therapeutic drug level monitoring: Secondary | ICD-10-CM

## 2023-05-14 LAB — POCT INR: INR: 1.3 — AB (ref 2.0–3.0)

## 2023-05-14 NOTE — Patient Instructions (Signed)
Did not take warfarin Sat and Sun by mistake.  Pt held it x 5 days.   Restart warfarin 1/2 tablet daily Currently on Keflex x 9 days On Amiodarone 300mg  daily Recheck INR 2 wk

## 2023-05-16 ENCOUNTER — Encounter (HOSPITAL_COMMUNITY): Payer: Self-pay

## 2023-05-16 ENCOUNTER — Ambulatory Visit (HOSPITAL_COMMUNITY)
Admission: RE | Admit: 2023-05-16 | Discharge: 2023-05-16 | Disposition: A | Discharge status: Home / Self Care | Payer: Medicare Other | Source: Ambulatory Visit | Attending: Cardiology | Admitting: Cardiology

## 2023-05-16 DIAGNOSIS — I5022 Chronic systolic (congestive) heart failure: Secondary | ICD-10-CM | POA: Insufficient documentation

## 2023-05-16 LAB — BASIC METABOLIC PANEL
Anion gap: 12 (ref 5–15)
BUN: 29 mg/dL — ABNORMAL HIGH (ref 8–23)
CO2: 22 mmol/L (ref 22–32)
Calcium: 8.8 mg/dL — ABNORMAL LOW (ref 8.9–10.3)
Chloride: 101 mmol/L (ref 98–111)
Creatinine, Ser: 2.14 mg/dL — ABNORMAL HIGH (ref 0.61–1.24)
GFR, Estimated: 32 mL/min — ABNORMAL LOW (ref >60)
Glucose, Bld: 114 mg/dL — ABNORMAL HIGH (ref 70–99)
Potassium: 3.3 mmol/L — ABNORMAL LOW (ref 3.5–5.1)
Sodium: 135 mmol/L (ref 135–145)

## 2023-05-18 ENCOUNTER — Telehealth (HOSPITAL_COMMUNITY): Payer: Self-pay | Admitting: *Deleted

## 2023-05-18 DIAGNOSIS — I5022 Chronic systolic (congestive) heart failure: Secondary | ICD-10-CM

## 2023-05-18 MED ORDER — POTASSIUM CHLORIDE CRYS ER 10 MEQ PO TBCR: 40.0000 meq | EXTENDED_RELEASE_TABLET | Freq: Every day | ORAL | 11 refills | Status: DC

## 2023-05-18 NOTE — Telephone Encounter (Signed)
Called patient per Prince Rome, NP with following lab results and instructions: "Renal function up after increase in diuretics, K is low. Increase KCL to 3 tabs/day (60 mEq daily). Patient has INR check at [facility] in [place] on 06/01/23. Please see if they will draw a BMET for [place] at this visit to follow patient's renal function." Pt reports he has only been taking potassium 20 meq daily (he had not increased his dose to 40 daily). He will increase to 40 meq daily and will get repeat labs at [facility]madin Clinic in [place]. Orders placed. Pt verbalized understanding of same.

## 2023-05-21 ENCOUNTER — Inpatient Hospital Stay: Payer: Medicare Other

## 2023-05-21 ENCOUNTER — Inpatient Hospital Stay: Payer: Medicare Other | Attending: Hematology | Admitting: Oncology

## 2023-05-21 VITALS — BP 113/57 | HR 66 | Temp 98.2°F | Resp 16 | Wt 159.8 lb

## 2023-05-21 DIAGNOSIS — Z7901 Long term (current) use of anticoagulants: Secondary | ICD-10-CM | POA: Diagnosis not present

## 2023-05-21 DIAGNOSIS — D473 Essential (hemorrhagic) thrombocythemia: Secondary | ICD-10-CM | POA: Insufficient documentation

## 2023-05-21 DIAGNOSIS — D72819 Decreased white blood cell count, unspecified: Secondary | ICD-10-CM | POA: Diagnosis not present

## 2023-05-21 DIAGNOSIS — D649 Anemia, unspecified: Secondary | ICD-10-CM | POA: Diagnosis not present

## 2023-05-21 DIAGNOSIS — I739 Peripheral vascular disease, unspecified: Secondary | ICD-10-CM

## 2023-05-21 LAB — CBC WITH DIFFERENTIAL/PLATELET
Abs Immature Granulocytes: 0.02 10*3/uL (ref 0.00–0.07)
Basophils Absolute: 0 10*3/uL (ref 0.0–0.1)
Basophils Relative: 1 %
Eosinophils Absolute: 0 10*3/uL (ref 0.0–0.5)
Eosinophils Relative: 1 %
HCT: 27.4 % — ABNORMAL LOW (ref 39.0–52.0)
Hemoglobin: 9 g/dL — ABNORMAL LOW (ref 13.0–17.0)
Immature Granulocytes: 1 %
Lymphocytes Relative: 13 %
Lymphs Abs: 0.2 10*3/uL — ABNORMAL LOW (ref 0.7–4.0)
MCH: 46.2 pg — ABNORMAL HIGH (ref 26.0–34.0)
MCHC: 32.8 g/dL (ref 30.0–36.0)
MCV: 140.5 fL — ABNORMAL HIGH (ref 80.0–100.0)
Monocytes Absolute: 0.2 10*3/uL (ref 0.1–1.0)
Monocytes Relative: 8 %
Neutro Abs: 1.4 10*3/uL — ABNORMAL LOW (ref 1.7–7.7)
Neutrophils Relative %: 76 %
Platelets: 367 10*3/uL (ref 150–400)
RBC: 1.95 MIL/uL — ABNORMAL LOW (ref 4.22–5.81)
RDW: 15.9 % — ABNORMAL HIGH (ref 11.5–15.5)
WBC: 1.9 10*3/uL — ABNORMAL LOW (ref 4.0–10.5)
nRBC: 0 % (ref 0.0–0.2)

## 2023-05-21 LAB — COMPREHENSIVE METABOLIC PANEL
ALT: 22 U/L (ref 0–44)
AST: 25 U/L (ref 15–41)
Albumin: 3.8 g/dL (ref 3.5–5.0)
Alkaline Phosphatase: 53 U/L (ref 38–126)
Anion gap: 9 (ref 5–15)
BUN: 40 mg/dL — ABNORMAL HIGH (ref 8–23)
CO2: 23 mmol/L (ref 22–32)
Calcium: 9.1 mg/dL (ref 8.9–10.3)
Chloride: 102 mmol/L (ref 98–111)
Creatinine, Ser: 1.94 mg/dL — ABNORMAL HIGH (ref 0.61–1.24)
GFR, Estimated: 36 mL/min — ABNORMAL LOW (ref >60)
Glucose, Bld: 96 mg/dL (ref 70–99)
Potassium: 4.5 mmol/L (ref 3.5–5.1)
Sodium: 134 mmol/L — ABNORMAL LOW (ref 135–145)
Total Bilirubin: 0.6 mg/dL (ref <1.2)
Total Protein: 7.5 g/dL (ref 6.5–8.1)

## 2023-05-21 LAB — IRON AND TIBC
Iron: 94 ug/dL (ref 45–182)
Saturation Ratios: 23 % (ref 17.9–39.5)
TIBC: 406 ug/dL (ref 250–450)
UIBC: 312 ug/dL

## 2023-05-21 LAB — FERRITIN: Ferritin: 32 ng/mL (ref 24–336)
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
## 2023-05-24 ENCOUNTER — Telehealth: Payer: Self-pay | Admitting: *Deleted

## 2023-05-24 NOTE — Telephone Encounter (Signed)
Patient notified of information below.  Verbalized understanding and will begin therapy today.

## 2023-05-24 NOTE — Telephone Encounter (Signed)
-----   Message from Mauro Kaufmann sent at 05/24/2023  7:26 AM EST ----- Good morning Aleczander Fandino,   Would you mind calling this patient and seeing if he could start on iron supplements as iron levels were slightly on the lower end.  He has slowly trended down as far as his hemoglobin is concerned over the past 6 months.  I would like him to take either ferrous sulfate or ferrous gluconate based on what ever he would prefer once daily.  If he cannot tolerate iron tablets, I would recommend some IV iron to see if we can get his hemoglobin up.  Let me know what he says.  Durenda Hurt, NP 05/24/2023 7:27 AM

## 2023-06-01 ENCOUNTER — Ambulatory Visit: Payer: Medicare Other | Attending: Cardiology | Admitting: *Deleted

## 2023-06-01 DIAGNOSIS — Z5181 Encounter for therapeutic drug level monitoring: Secondary | ICD-10-CM

## 2023-06-01 DIAGNOSIS — Z952 Presence of prosthetic heart valve: Secondary | ICD-10-CM | POA: Diagnosis not present

## 2023-06-01 LAB — POCT INR: INR: 3.1 — AB (ref 2.0–3.0)

## 2023-06-01 NOTE — Progress Notes (Signed)
 Clinic Note     Referring Practitoner: Orlando Sula Dome, NP [address] [place],  [place] [address] Reason for Visit:  Chief Complaint  Patient presents with  . Visit Follow Up    HPI    Taylor [name] is a 73 y.o. male who presents for surveillance concerning patient's nonhealing lower extremity wounds.  Patient reports developing sharp pins-and-needles/stabbing pain to the bottom of the feet with activity.  Patient does not report classic claudication symptoms such as development of cramping with ambulation.  Patient reports previous medication efforts with gabapentin resulted in unsatisfactory side effects.  Patient is currently working with our podiatry partners to maximize options for wound healing.  Taylor [name] is accompanied.  He is independently ambulatory.  Taylor [name] is medically managed on aspirin  81 mg; no statin medication secondary to side effects.  They are currently anticoagulated with warfarin.    Smoking status: Patient with a reported 33-year history of tobacco use towing estimate 33 pack years.  Patient quit smoking approximately 7 years ago.  Medical History      Past Medical History:  Diagnosis Date  . AAA (abdominal aortic aneurysm) (CMS-HCC)   . Chronic kidney disease   . Coronary artery disease   . Essential thrombocytosis (CMS/HHS-HCC)   . HLD (hyperlipidemia)   . Hypertension   . Hypothyroidism (acquired)   . Ischemic cardiomyopathy   . NSTEMI (non-ST elevated myocardial infarction) (CMS/HHS-HCC)   . S/P AVR (aortic valve replacement)    mechanical    Past Surgical History:  Procedure Laterality Date  . APPENDECTOMY  1964  . CARDIAC SURGERY  1994   mech AVR  . s/p pci x 2  1994  . REOPERATION ON HEART N/A 05/24/2016   Procedure: REOPERATION, CORONARY ARTERY BYPASS OR VALVE PROCEDURE MORE THAN 1 MONTH AFTER ORIGINAL OPERATION (LIST IN ADDITION TO PRIMARY PROCEDURE), Redo median sternotomy, s/p avr '94;  Surgeon: Lang Lannie Salter, MD;  Location: [facility];  Service: Cardiothoracic;  Laterality: N/A;  . CORONARY ARTERY BYPASS W/SINGLE ARTERY GRAFT N/A 05/24/2016   Procedure: CORONARY ARTERY BYPASS, USING ARTERIAL GRAFT(S); SINGLE ARTERIAL GRAFT, Left internal mammary artery harvest;  Surgeon: Lang Lannie Salter, MD;  Location: [facility];  Service: Cardiothoracic;  Laterality: N/A;  . CORONARY ARTERY BYPASS W/VEIN ONLY N/A 05/24/2016   Procedure: CORONARY ARTERY BYPASS, USING VENOUS GRAFT(S) AND ARTERIAL GRAFT(S); 2 VENOUS GRAFTS  (LIST IN ADDITION TO PRIMARY PROCEDURE);  Surgeon: Lang Lannie Salter, MD;  Location: [facility];  Service: Cardiothoracic;  Laterality: N/A;  . REPLACEMENT AORTIC VALVE N/A 05/24/2016   Procedure: Redo REPLACEMENT, AORTIC VALVE, OPEN, WITH CARDIOPULMONARY BYPASS, STERNOTOMY; WITH PROSTHETIC VALVE OTHER THAN HOMOGRAFT OR STENTLESS VALVE, possible, s/p avr '94;  Surgeon: Lang Lannie Salter, MD;  Location: [facility];  Service: Cardiothoracic;  Laterality: N/A;  . PERCUTANEOUS INSERTION INTRA-AORTIC BALLOON CATH Right 05/24/2016   Procedure: INSERTION OF INTRA-AORTIC BALLOON ASSIST DEVICE, PERCUTANEOUS;  Surgeon: Lang Lannie Salter, MD;  Location: [facility];  Service: Cardiothoracic;  Laterality: Right;  . ANGIOGRAPHY LOWER EXTREMITY Left 01/01/2023   Procedure: ANGIOGRAPHY, LOWER EXTREMITY, UNILATERAL, RADIOLOGICAL SUPERVISION AND INTERPRETATION;  Surgeon: Luke Salt, MD;  Location: [facility];  Service: General Surgery;  Laterality: Left;  LLE angio, possible intervention, poss foot debridement  . SELECTIVE PLACEMENT ARTERIAL SYSTEM CATH ABDOMEN Left 01/01/2023   Procedure: SELECTIVE CATHETER PLACEMENT, ARTERIAL SYSTEM; INITIAL SECOND ORDER ABDOMINAL, PELVIC, OR LOWER EXTREMITY ARTERY BRANCH, WITHIN A VASCULAR FAMILY;  Surgeon: Luke,  Neysa, MD;  Location: [facility];  Service: General Surgery;  Laterality: Left;  . BYPASS GRAFT  FEMORAL-POPLITEAL Left 01/03/2023   Procedure: BYPASS GRAFT, WITH OTHER THAN VEIN; FEMORAL-POPLITEAL;  Surgeon: Luke Neysa, MD;  Location: [facility];  Service: General Surgery;  Laterality: Left;  . REOPERATION FEM-POPLITEAL OR FEM (POPLITEAL)-ANTERIOR TIBIAL, POSTERIOR TIBIAL, PERONEAL ARTERY, OTHER DISTAL VESSELS Left 01/03/2023   Procedure: REOPERATION, FEM-POPLITEAL OR FEM (POPLITEAL)-ANTERIOR TIBIAL, POSTERIOR TIBIAL, PERONEAL ARTERY, OTHER DISTAL VESSELS, MORE 1 MONTH AFTER ORIGINAL OPERATION (LIST IN ADD CODE FOR PRIMARY PROCEDURE);  Surgeon: Luke Neysa, MD;  Location: [facility];  Service: General Surgery;  Laterality: Left;  . FLAP MYOCUTANEOUS/FASCIOCUTANEOUS LEG Left 01/03/2023   Procedure: MUSCLE, MYOCUTANEOUS, OR FASCIOCUTANEOUS FLAP; LOWER EXTREMITY;  Surgeon: Luke Neysa, MD;  Location: [facility];  Service: General Surgery;  Laterality: Left;  . TONSILLECTOMY      Social History   Socioeconomic History  . Marital status: Single  Tobacco Use  . Smoking status: Former    Current packs/day: 0.00    Average packs/day: 1 pack/day for 33.0 years (33.0 ttl pk-yrs)    Types: Cigarettes    Start date: 05/18/1983    Quit date: 05/17/2016    Years since quitting: 7.0  . Smokeless tobacco: Never  Substance and Sexual Activity  . Alcohol  use: Yes    Alcohol /week: 14.0 standard drinks of alcohol     Types: 14 Cans of beer per week    Comment: 2 cans beer per day  . Drug use: No  . Sexual activity: Yes    Partners: Female   Social Drivers of Health   Financial Resource Strain: Patient Unable To Answer (01/10/2023)   Overall Financial Resource Strain (CARDIA)   . Difficulty of Paying Living Expenses: Patient unable to answer  Food Insecurity: Patient Unable To Answer (01/10/2023)   Hunger Vital Sign   . Worried About Programme Researcher, Broadcasting/film/video in the Last Year: Patient unable to answer   . Ran Out of Food in the Last Year: Patient unable to answer  Transportation Needs: Patient  Unable To Answer (01/10/2023)   PRAPARE - Transportation   . Lack of Transportation (Medical): Patient unable to answer   . Lack of Transportation (Non-Medical): Patient unable to answer  Housing Stability: Low Risk  (12/30/2022)   Housing Stability Vital Sign   . Unable to Pay for Housing in the Last Year: No   . Number of Times Moved in the Last Year: 0   . Homeless in the Last Year: No    Family History  Problem Relation Age of Onset  . Heart failure Father     Objective Data      Physical Exam Vitals:   05/29/23 1336  BP: 130/70  Pulse: 66  Weight: 70.8 kg (156 lb)  Height: 175.3 cm (5' 9)  PainSc:   8  PainLoc: Foot   Focused physical exam bilateral lower extremities: Left lower extremity presents with 2 well demarcated ulcerative wounds.  The first wound is on the lateral side of the midfoot and the second wound is on the lateral aspect of the malleolus.  Mild to moderate reactive tissue surrounding the ulcers.  Remainder of leg through the calf is edematous with heavy scaling. Right lower extremity presents with wounds to the lateral malleolus as well as the lateral midfoot as well.  Patient with similar reactive tissue surrounding these wounds and nonpitting edema.  Vascular Exam    Right Left  Radial  2+ 2+  Dorsalis Pedis Pulse: 0 Doppler: Monophasic Pulse: 0 Doppler: Biphasic  Posterior Tibial Pulse: 0 Doppler: Monophasic Pulse: 0* Doppler: Biphasic   Noninvasive Testing Duplex ultrasound of the left lower extremity with ABI testing was performed in clinic on May 29, 2023.  Testing demonstrates a widely patent left common femoral to popliteal artery bypass graft.  No elevated velocities or waveform blunting were noted to the inflow, conduit or outflow.  Lower extremity ABIs were 0.85 on the right and 0.97 on the left.  Absolute digit pressures were 53 mmHg and 83 mmHg respectively yielding indices of 0.38 and 0.59.  Assessment & Plan      Mr. Coluccio is a  73 y.o. male with nonhealing wounds to the bilateral feet.  Noninvasive testing demonstrates patient's left lower extremity bypass to be widely patent.  Regarding patient's pain primarily affecting the feet given the poor response to narcotic medications, the presentation and description strongly suspect the primary driver of the symptoms is neuropathic pain.  Patient reports previous efforts at gabapentin resulted in him feeling groggy and steady.  Will attempt the use of Lyrica as an alternative.  Patient likely has a element of arterial insufficiency and suspect there may be some artificial elevation of the ABI values given the extent of arterial wall calcification noted on x-ray from December 29, 2022.  Despite the calcifications patient does maintain biphasic waveforms to the level of the ankle on the left lower extremity that suggests despite the calcifications patient's arterial vasculature remains patent.  Suspect patient also has venous/lymphatic insufficiency causing a congestive affect down in the foot.  Ideally would consider compression of the extremities however this does run counter to efforts at increasing perfusion to the foot given these longstanding wounds.  Patient also reports some difficulty with walking following up to the likely neuropathic symptoms.  Should the Lyrica prove beneficial at alleviating the symptoms would encourage patient to be very active with regards to walking and utilize the calf pump as a method to move fluid out of the lower extremities.  Concerning the wounds some concern that they are chronically colonized and would benefit from debridement considerations.  Would also consider the potential benefit from hyperbaric therapy.  Concur with podiatry partners suggestion to get input from wound care with regards to these options.  Should patient continue to experience difficulty with getting these wounds to heal and the extent of the calcifications seen on x-rays he may benefit  from at least a diagnostic arteriogram to identify any areas of stenosis/occlusion that would be amenable to endovascular considerations.  Given addition of Lyrica to his medication regimen will have patient follow-up with a video visit in 1 month to assess for efficacy.  Discussed the plan as follows:   Diagnoses and all orders for this visit:  PAD (peripheral artery disease) (CMS-HCC)  S/P bypass graft of extremity  Long-term (current) use of anticoagulants, INR goal 2.0-3.0  Stasis edema of both lower extremities  Neuropathic pain of both feet  Other orders -     pregabalin (LYRICA) 50 MG capsule; Take 1 capsule (50 mg total) by mouth 2 (two) times daily for 60 days    Return in about 4 weeks (around 06/26/2023) for Video visit - 1 month - assess medicatoin efficacy. No orders of the defined types were placed in this encounter.   Will plan on 1 month video visit follow-up to discuss efficacy of Lyrica therapy.   Questions were welcomed and answered.  Patient  verbalized understanding and feels comfortable reaching out to us  if there are any further questions on concerns.   TIME: I spent a total of 40 minutes in both face-to-face and non-face-to-face activities for this visit on the date of the encounter.    Attestation Statement:   I personally performed the service, non-incident to. (WP)   BRYAN CHRISTOPHER KOZAK, PA    Dorise Rand, PA-C Physician Assistant Vascular and Endovascular Surgery

## 2023-06-01 NOTE — Patient Instructions (Signed)
 Continue warfarin 1/2 tablet daily Currently on Cipro x 4 more days On Amiodarone 300mg  daily Recheck INR 2 wk

## 2023-06-04 ENCOUNTER — Other Ambulatory Visit: Payer: Self-pay | Admitting: Physician Assistant

## 2023-06-04 DIAGNOSIS — D473 Essential (hemorrhagic) thrombocythemia: Secondary | ICD-10-CM

## 2023-06-14 ENCOUNTER — Ambulatory Visit: Payer: Medicare Other | Attending: Internal Medicine | Admitting: *Deleted

## 2023-06-14 DIAGNOSIS — Z5181 Encounter for therapeutic drug level monitoring: Secondary | ICD-10-CM | POA: Diagnosis not present

## 2023-06-14 DIAGNOSIS — Z952 Presence of prosthetic heart valve: Secondary | ICD-10-CM | POA: Diagnosis not present

## 2023-06-14 LAB — POCT INR: INR: 2.1 (ref 2.0–3.0)

## 2023-06-14 NOTE — Patient Instructions (Signed)
Continue warfarin 1/2 tablet daily  Currently on Cipro x 1 more wk Going to add Kale back into his diet On Amiodarone 300mg  daily Recheck INR 2 wk

## 2023-06-27 ENCOUNTER — Ambulatory Visit: Payer: Medicare Other | Attending: Internal Medicine | Admitting: *Deleted

## 2023-06-27 DIAGNOSIS — Z5181 Encounter for therapeutic drug level monitoring: Secondary | ICD-10-CM

## 2023-06-27 DIAGNOSIS — Z952 Presence of prosthetic heart valve: Secondary | ICD-10-CM | POA: Diagnosis not present

## 2023-06-27 LAB — POCT INR: INR: 2.1 (ref 2.0–3.0)

## 2023-06-27 NOTE — Patient Instructions (Signed)
Continue warfarin 1/2 tablet daily  Added Kale back into his diet On Amiodarone 300mg  daily Recheck INR 3 wks

## 2023-07-16 ENCOUNTER — Encounter: Payer: Self-pay | Admitting: Cardiology

## 2023-07-17 NOTE — Telephone Encounter (Signed)
Messaged pt back. They are also o/d for f/u w/ WC. Sent back to [employer].

## 2023-07-18 ENCOUNTER — Ambulatory Visit: Payer: Medicare Other

## 2023-07-23 ENCOUNTER — Ambulatory Visit: Payer: Medicare Other | Attending: Internal Medicine | Admitting: *Deleted

## 2023-07-23 ENCOUNTER — Other Ambulatory Visit: Payer: Self-pay | Admitting: Internal Medicine

## 2023-07-23 DIAGNOSIS — Z952 Presence of prosthetic heart valve: Secondary | ICD-10-CM | POA: Diagnosis not present

## 2023-07-23 DIAGNOSIS — Z5181 Encounter for therapeutic drug level monitoring: Secondary | ICD-10-CM

## 2023-07-23 LAB — POCT INR: INR: 4.4 — AB (ref 2.0–3.0)

## 2023-07-23 NOTE — Patient Instructions (Signed)
 Hold warfarin tonight and tomorrow night then resume 1/2 tablet daily  Has not had Kale this 3 weeks On Amiodarone 300mg  daily Restart Kale Recheck INR 2 wks

## 2023-07-31 ENCOUNTER — Telehealth (HOSPITAL_COMMUNITY): Payer: Self-pay | Admitting: *Deleted

## 2023-07-31 NOTE — Telephone Encounter (Signed)
 Called patient and left message reminding pt of appointment tomorrow in [facility] at 2:30. Asked patient to call us if he does not plan on coming.

## 2023-08-01 ENCOUNTER — Ambulatory Visit (HOSPITAL_COMMUNITY)
Admission: RE | Admit: 2023-08-01 | Discharge: 2023-08-01 | Disposition: A | Discharge status: Home / Self Care | Payer: Medicare Other | Source: Ambulatory Visit | Attending: Adult Health | Admitting: Adult Health

## 2023-08-01 ENCOUNTER — Encounter (HOSPITAL_COMMUNITY): Payer: Self-pay

## 2023-08-01 VITALS — BP 136/68 | HR 57 | Ht 69.0 in | Wt 157.2 lb

## 2023-08-01 DIAGNOSIS — N189 Chronic kidney disease, unspecified: Secondary | ICD-10-CM | POA: Insufficient documentation

## 2023-08-01 DIAGNOSIS — I130 Hypertensive heart and chronic kidney disease with heart failure and stage 1 through stage 4 chronic kidney disease, or unspecified chronic kidney disease: Secondary | ICD-10-CM | POA: Insufficient documentation

## 2023-08-01 DIAGNOSIS — F1010 Alcohol abuse, uncomplicated: Secondary | ICD-10-CM

## 2023-08-01 DIAGNOSIS — I714 Abdominal aortic aneurysm, without rupture, unspecified: Secondary | ICD-10-CM | POA: Diagnosis not present

## 2023-08-01 DIAGNOSIS — I2510 Atherosclerotic heart disease of native coronary artery without angina pectoris: Secondary | ICD-10-CM | POA: Diagnosis not present

## 2023-08-01 DIAGNOSIS — Z952 Presence of prosthetic heart valve: Secondary | ICD-10-CM | POA: Insufficient documentation

## 2023-08-01 DIAGNOSIS — Z7901 Long term (current) use of anticoagulants: Secondary | ICD-10-CM | POA: Diagnosis not present

## 2023-08-01 DIAGNOSIS — I739 Peripheral vascular disease, unspecified: Secondary | ICD-10-CM

## 2023-08-01 DIAGNOSIS — Z951 Presence of aortocoronary bypass graft: Secondary | ICD-10-CM | POA: Diagnosis not present

## 2023-08-01 DIAGNOSIS — I480 Paroxysmal atrial fibrillation: Secondary | ICD-10-CM | POA: Insufficient documentation

## 2023-08-01 DIAGNOSIS — Z8679 Personal history of other diseases of the circulatory system: Secondary | ICD-10-CM | POA: Insufficient documentation

## 2023-08-01 DIAGNOSIS — I5022 Chronic systolic (congestive) heart failure: Secondary | ICD-10-CM | POA: Insufficient documentation

## 2023-08-01 DIAGNOSIS — Z79899 Other long term (current) drug therapy: Secondary | ICD-10-CM | POA: Diagnosis not present

## 2023-08-01 DIAGNOSIS — Z87891 Personal history of nicotine dependence: Secondary | ICD-10-CM | POA: Diagnosis not present

## 2023-08-01 DIAGNOSIS — Z7982 Long term (current) use of aspirin: Secondary | ICD-10-CM | POA: Insufficient documentation

## 2023-08-01 DIAGNOSIS — Z8674 Personal history of sudden cardiac arrest: Secondary | ICD-10-CM | POA: Insufficient documentation

## 2023-08-01 DIAGNOSIS — Z86718 Personal history of other venous thrombosis and embolism: Secondary | ICD-10-CM | POA: Diagnosis not present

## 2023-08-01 LAB — BASIC METABOLIC PANEL
Anion gap: 12 (ref 5–15)
BUN: 53 mg/dL — ABNORMAL HIGH (ref 8–23)
CO2: 26 mmol/L (ref 22–32)
Calcium: 9.3 mg/dL (ref 8.9–10.3)
Chloride: 104 mmol/L (ref 98–111)
Creatinine, Ser: 1.86 mg/dL — ABNORMAL HIGH (ref 0.61–1.24)
GFR, Estimated: 38 mL/min — ABNORMAL LOW (ref >60)
Glucose, Bld: 83 mg/dL (ref 70–99)
Potassium: 4.4 mmol/L (ref 3.5–5.1)
Sodium: 142 mmol/L (ref 135–145)

## 2023-08-01 LAB — BRAIN NATRIURETIC PEPTIDE: B Natriuretic Peptide: 560.2 pg/mL — ABNORMAL HIGH (ref 0.0–100.0)

## 2023-08-01 NOTE — Patient Instructions (Addendum)
 Medication Changes:  TAKE FUROSEMIDE (LASIX) 60MG  ONCE DAILY FOR THE NEXT 3 DAYS   THEN RETURN TO 40MG  ONCE DAILY   Lab Work:  Labs done today, your results will be available in MyChart, we will contact you for abnormal readings.  Follow-Up in: in 1-2 months PLEASE CALL OUR OFFICE AROUND APRIL TO GET SCHEDULED FOR YOUR APPOINTMENT. PHONE NUMBER IS [phone] OPTION 2   At the [facility], you and your health needs are our priority. We have a designated team specialized in the treatment of Heart Failure. This Care Team includes your primary Heart Failure Specialized Cardiologist (physician), Advanced Practice Providers (APPs- Physician Assistants and Nurse Practitioners), and Pharmacist who all work together to provide you with the care you need, when you need it.   You may see any of the following providers on your designated Care Team at your next follow up:  Dr. Arvilla Meres Dr. Marca Ancona Dr. Dorthula Nettles Dr. Theresia Bough Tonye Becket, NP Robbie Lis, [place] [facility] Red Bud, [place] Brynda Peon, NP [place] Lee, NP Karle Plumber, PharmD   Please be sure to bring in all your medications bottles to every appointment.   Need to Contact us:  If you have any questions or concerns before your next appointment please send [place] a message through [place] or call our office at [phone].    TO LEAVE A MESSAGE FOR THE NURSE SELECT OPTION 2, PLEASE LEAVE A MESSAGE INCLUDING: YOUR NAME DATE OF BIRTH CALL BACK NUMBER REASON FOR CALL**this is important as we prioritize the call backs  YOU WILL RECEIVE A CALL BACK THE SAME DAY AS LONG AS YOU CALL BEFORE 4:00 PM

## 2023-08-01 NOTE — Progress Notes (Signed)
 [facility] Note    PCP: Renaldo Harrison, DO HF Cardiologist: Dr. Gala Romney   Chief Complaint: Heart Failure Follow-up HPI: Taylor [name] is a 73 y.o.. male with AoV abnormality s/p mechanical AVR (on coumadin), h/o VF arrest in 2017 s/p CRT-D, CAD s/p CABG x3 + redo mAVR, chronic systolic HF (EF 20-25%), paroxysmal AF, AAA s/p EVAR with b/l fem endarterectomies c/b thrombosis of L fem and foot ulcer, essential thrombosis, ETOH, cocaine, and tobacco abuse.     Echo 12/18  LVEF of 25-30% stable AVR Echo 10/20  EF 20-25%. RV normal Echo 9/22: EF 25-30%, AVR looks good.   In AFL 2/23, EP unsuccessful with pace termination. Underwent DCCV to NS[facility] (4/23) but had ERAF about a week later.  Echo (5/23) EF 25-30%, grade II DD, RV ok, s/p AVR with mild AI  S/p CTI ablation 7/23 at [facility] with Dr. Jean Rosenthal. Patient called [facility] 8/23 with 3 ICD therapies. He notified Dr. Graciela Husbands who increased amio.  Today he returns for HF follow up with his wife. Overall feeling fine. He has SOB walking up hill and stairs. He has rest pain in BLE with chronic LLE edema, and has been receiving Soft Wave therapy per [facility], with short-term improvement. Denies palpitations,  CP, dizziness, or PND/Orthopnea. Sleeps on 1 pillow. Appetite ok. No fever or chills. Taking all medications. BP at home 100/60s. Reports he is still drinking "too much".  Past Medical History:  Diagnosis Date   Abdominal aortic aneurysm (AAA) (HCC)    AICD (automatic cardioverter/defibrillator) present    Atherosclerotic heart disease of native coronary artery without angina pectoris    Chronic anticoagulation 02/18/2018   Chronic systolic heart failure (HCC) 02/02/2017   Essential thrombocythemia (HCC) 02/15/2017   Hyperlipidemia    Hypertension    Hypothyroidism    Ischemic cardiomyopathy    Leg pain, central, left 04/17/2017   Hx sciatica - pain similar; no red flag signs on hx or exam    Presence of other heart-valve  replacement    Presence of permanent cardiac pacemaker    Thrombocytosis    pt states he takes a "chemo pill" for this   Ventricular tachycardia (HCC)    Current Outpatient Medications  Medication Sig Dispense Refill   amiodarone (PACERONE) 200 MG tablet TAKE 0.5 TABLET BY MOUTH EVERY MORNING AND 1 TABLET EVERY EVENING 135 tablet 3   amoxicillin (AMOXIL) 500 MG capsule Take 4 capsules (2.000mg ) one hour before dental procedures. 4 capsule 0   Ascorbic Acid ([employer] VITAMIN C PO) Take 1 tablet by mouth daily.     aspirin 81 MG tablet Take 81 mg by mouth daily.     carvedilol (COREG) 3.125 MG tablet Take 1 tablet (3.125 mg total) by mouth 2 (two) times daily with a meal. 180 tablet 3   Cholecalciferol (VITAMIN D) 50 MCG (2000 UT) tablet Take 2,000 Units by mouth daily.     dapagliflozin propanediol (FARXIGA) 10 MG TABS tablet Take 1 tablet (10 mg total) by mouth daily. 90 tablet 3   ferrous sulfate 325 (65 FE) MG tablet Take 325 mg by mouth daily with breakfast.     furosemide (LASIX) 20 MG tablet Take 2 tablets (40 mg total) by mouth daily. 60 tablet 11   hydroxyurea (HYDREA) 500 MG capsule TAKE 2 CAPSULES(1000 MG) BY MOUTH DAILY. MAY TAKE WITH FOOD TO MINIMIZE GI SIDE EFFECTS (Patient taking differently: Take 3 tablet (1500 mg )by mouth daily on Mon,Wed,Friday) 180 capsule 0  levothyroxine (SYNTHROID) 75 MCG tablet Take 75 mcg by mouth daily before breakfast.     potassium chloride (KLOR-CON M) 10 MEQ tablet Take 4 tablets (40 mEq total) by mouth daily. (Patient taking differently: Take 20 mEq by mouth 2 (two) times daily. Take 1 tablet by mouth twice daily.) 120 tablet 11   spironolactone (ALDACTONE) 25 MG tablet Take 1 tablet (25 mg total) by mouth every other day. 45 tablet 3   traZODone (DESYREL) 50 MG tablet Take 50 mg by mouth at bedtime.     vitamin B-12 (CYANOCOBALAMIN) 1000 MCG tablet Take 1,000 mcg by mouth daily.     warfarin (COUMADIN) 5 MG tablet Take 1/2 tablet daily by mouth or as  directed by Anticoagulation Clinic. 40 tablet 1   No current facility-administered medications for this encounter.   Allergies  Allergen Reactions   Erythromycin Rash   Rosuvastatin Calcium     Muscle cramps   Simvastatin     Muscle cramps   Zithromax [Azithromycin] Rash   Social History   Socioeconomic History   Marital status: Divorced    Spouse name: Not on file   Number of children: 2   Years of education: Not on file   Highest education level: Not on file  Occupational History   Not on file  Tobacco Use   Smoking status: Former    Current packs/day: 0.00    Average packs/day: 1 pack/day for 37.0 years (37.0 ttl pk-yrs)    Types: Cigarettes    Start date: 05/26/1979    Quit date: 05/18/2016    Years since quitting: 7.2    Passive exposure: Never   Smokeless tobacco: Never  Vaping Use   Vaping status: Never Used  Substance and Sexual Activity   Alcohol use: Yes    Alcohol/week: 10.0 standard drinks of alcohol    Types: 10 Cans of beer per week    Comment: 2 beers a day max (sometimes just one a day)   Drug use: No   Sexual activity: Not on file  Other Topics Concern   Not on file  Social History Narrative   Not on file   Social Drivers of Health   Financial Resource Strain: Patient Unable To Answer (01/10/2023)   Received from [facility]   Overall Financial Resource Strain (CARDIA)    Difficulty of Paying Living Expenses: Patient unable to answer  Food Insecurity: Patient Unable To Answer (01/10/2023)   Received from [facility]   Hunger Vital Sign    Worried About Running Out of Food in the Last Year: Patient unable to answer    Ran Out of Food in the Last Year: Patient unable to answer  Transportation Needs: Patient Unable To Answer (01/10/2023)   Received from [facility]   PRAPARE - Transportation    In the past 12 months, has lack of transportation kept you from medical appointments or from  getting medications?: Patient unable to answer    Lack of Transportation (Non-Medical): Patient unable to answer  Physical Activity: Not on file  Stress: Not on file  Social Connections: Not on file  Intimate Partner Violence: Not At Risk (04/03/2022)   Humiliation, Afraid, Rape, and Kick questionnaire    Fear of Current or Ex-Partner: No    Emotionally Abused: No    Physically Abused: No    Sexually Abused: No   Family History  Problem Relation Age of Onset   Hypertension Father  BP 136/68   Pulse (!) 57   Ht 5\' 9"  (1.753 m)   Wt 71.3 kg (157 lb 3.2 oz)   SpO2 100%   BMI 23.21 kg/m   Wt Readings from Last 3 Encounters:  08/01/23 71.3 kg (157 lb 3.2 oz)  05/21/23 72.5 kg (159 lb 12.8 oz)  05/02/23 71.6 kg (157 lb 12.8 oz)   PHYSICAL EXAM: General: Elderly appearing. No distress on RA. Walked into clinic Cardiac: JVP ~11cm. S1 and S2 present. No murmurs or rub. Resp: Lung sounds clear and equal B/L Extremities: Warm and dry. 1+ edema. LLE orthotic Neuro: Alert and oriented x3. Affect pleasant.  Device interrogation (personally reviewed): 99% LV paced, 0AF/AT burden, mean HR 67 bpm, 31 ATR noted on 2/24 Vrate in 70s, 0.4h/d activity level  ASSESSMENT & PLAN: 1. Chronic Systolic Heart Failure - Echo (05/2016 @ [facility]): EF 40% - Echo (12/20): EF 20-25%. Diffuse hypokinesis, No LVH. RV normal. Mild AI, trace MR - s/p CRT-D - Echo 6/24 EF 25-30%  - NYHA III, PAD and rest pain contributing - Volume up by exam. Take additional 20 mg Lasix x 3 days then resume Lasix 40 mg daily + 20 KCL bid. BNP/BMET today - Continue Farxiga 10 mg daily. - Continue Coreg 3.125 mg bid.  - Continue spironolactone 25 mg EOD - Off losartan with CKD and low BP - Discussed need for reduction/cessation of ETOH.  - Wants to discuss CCM/Barostim device with Dr. Gala Romney  2. CAD - h/o remote PCI in 1994 at [facility] - s/p LHC in 2017 in the setting of Vfib Arrest w/ 3V CAD treated w/ CABG x 3 - No s/s  angina  - Continue ASA, statin and ? blocker. - Goal LDL < 70   4. Mechanical Aortic Valve in 2017 - Valve stable on echo 6/24 - On coumadin. INR followed by Coumadin Clinic - No bleeding.  5. H/o VF Arrest and H/o CHB - s/p [employer] CRT-D device, followed by EP (Dr. Graciela Husbands) - Continue amiodarone 100 q am/200 q pm, per EP - No VT on device interrogation. - Followed by EP  6. PAD with AAA  - 6/24 EVAR for AAA requiring bilateral common femoral endarterectomies.  He had to go back on 11/10/2022 for a thrombosed left common femoral artery after endarterectomy. Then went to [facility] for left fem-pop. Course c/b LLE foot ulcer.  - Undergoing ESWT - Followed by [facility] and Podiatry - Continue statin + ASA  7. H/o substance abuse - Counseled on cutting back on ETOH again. Still drinking "too much"  Follow up in 1 months with Dr. Gala Romney. Would like to discuss CCM or barostim.  [place] Mattye Verdone, NP 08/01/23

## 2023-08-02 ENCOUNTER — Ambulatory Visit: Payer: Medicare Other

## 2023-08-02 DIAGNOSIS — I255 Ischemic cardiomyopathy: Secondary | ICD-10-CM

## 2023-08-04 LAB — CUP PACEART REMOTE DEVICE CHECK
Battery Remaining Longevity: 48 mo
Battery Remaining Percentage: 67 %
Brady Statistic RA Percent Paced: 0 %
Brady Statistic RV Percent Paced: 94 %
Date Time Interrogation Session: 20250305141700
HighPow Impedance: 61 Ohm
Implantable Lead Connection Status: 753985
Implantable Lead Connection Status: 753985
Implantable Lead Connection Status: 753985
Implantable Lead Implant Date: 20180105
Implantable Lead Implant Date: 20180105
Implantable Lead Implant Date: 20180105
Implantable Lead Location: 753858
Implantable Lead Location: 753859
Implantable Lead Location: 753860
Implantable Lead Model: 292
Implantable Lead Model: 4672
Implantable Lead Model: 7741
Implantable Lead Serial Number: 428973
Implantable Lead Serial Number: 801088
Implantable Lead Serial Number: 849519
Implantable Pulse Generator Implant Date: 20180105
Lead Channel Impedance Value: 511 Ohm
Lead Channel Impedance Value: 605 Ohm
Lead Channel Impedance Value: 606 Ohm
Lead Channel Pacing Threshold Amplitude: 0.8 V
Lead Channel Pacing Threshold Amplitude: 0.9 V
Lead Channel Pacing Threshold Amplitude: 1.1 V
Lead Channel Pacing Threshold Pulse Width: 0.4 ms
Lead Channel Pacing Threshold Pulse Width: 0.4 ms
Lead Channel Pacing Threshold Pulse Width: 1 ms
Lead Channel Setting Pacing Amplitude: 2 V
Lead Channel Setting Pacing Amplitude: 2 V
Lead Channel Setting Pacing Amplitude: 2.2 V
Lead Channel Setting Pacing Pulse Width: 0.4 ms
Lead Channel Setting Pacing Pulse Width: 1 ms
Lead Channel Setting Sensing Sensitivity: 0.6 mV
Lead Channel Setting Sensing Sensitivity: 1 mV
Pulse Gen Serial Number: 180139
Zone Setting Status: 755011

## 2023-08-07 ENCOUNTER — Encounter: Payer: Medicare Other | Admitting: Student

## 2023-08-08 ENCOUNTER — Ambulatory Visit: Payer: Medicare Other | Attending: Internal Medicine | Admitting: *Deleted

## 2023-08-08 DIAGNOSIS — Z5181 Encounter for therapeutic drug level monitoring: Secondary | ICD-10-CM | POA: Diagnosis not present

## 2023-08-08 DIAGNOSIS — Z952 Presence of prosthetic heart valve: Secondary | ICD-10-CM

## 2023-08-08 LAB — POCT INR: INR: 3.1 — AB (ref 2.0–3.0)

## 2023-08-08 NOTE — Patient Instructions (Signed)
 Continue warfarin 1/2 tablet daily  Eat salad tonight On Amiodarone 300mg  daily Recheck INR 3 wks

## 2023-08-21 ENCOUNTER — Other Ambulatory Visit: Payer: Self-pay

## 2023-08-21 DIAGNOSIS — D649 Anemia, unspecified: Secondary | ICD-10-CM

## 2023-08-21 DIAGNOSIS — D72819 Decreased white blood cell count, unspecified: Secondary | ICD-10-CM

## 2023-08-21 DIAGNOSIS — D473 Essential (hemorrhagic) thrombocythemia: Secondary | ICD-10-CM

## 2023-08-22 ENCOUNTER — Encounter: Payer: Self-pay | Admitting: Internal Medicine

## 2023-08-22 ENCOUNTER — Ambulatory Visit (HOSPITAL_COMMUNITY): Payer: Medicare Other

## 2023-08-22 ENCOUNTER — Inpatient Hospital Stay: Payer: Medicare Other | Attending: Hematology

## 2023-08-22 DIAGNOSIS — D649 Anemia, unspecified: Secondary | ICD-10-CM

## 2023-08-22 DIAGNOSIS — D473 Essential (hemorrhagic) thrombocythemia: Secondary | ICD-10-CM | POA: Diagnosis present

## 2023-08-22 DIAGNOSIS — D72819 Decreased white blood cell count, unspecified: Secondary | ICD-10-CM

## 2023-08-22 LAB — COMPREHENSIVE METABOLIC PANEL
ALT: 15 U/L (ref 0–44)
AST: 21 U/L (ref 15–41)
Albumin: 3.7 g/dL (ref 3.5–5.0)
Alkaline Phosphatase: 57 U/L (ref 38–126)
Anion gap: 13 (ref 5–15)
BUN: 49 mg/dL — ABNORMAL HIGH (ref 8–23)
CO2: 23 mmol/L (ref 22–32)
Calcium: 9.2 mg/dL (ref 8.9–10.3)
Chloride: 103 mmol/L (ref 98–111)
Creatinine, Ser: 2.29 mg/dL — ABNORMAL HIGH (ref 0.61–1.24)
GFR, Estimated: 29 mL/min — ABNORMAL LOW (ref >60)
Glucose, Bld: 96 mg/dL (ref 70–99)
Potassium: 4.3 mmol/L (ref 3.5–5.1)
Sodium: 139 mmol/L (ref 135–145)
Total Bilirubin: 0.6 mg/dL (ref 0.0–1.2)
Total Protein: 7.8 g/dL (ref 6.5–8.1)

## 2023-08-22 LAB — CBC WITH DIFFERENTIAL/PLATELET
Abs Immature Granulocytes: 0.09 10*3/uL — ABNORMAL HIGH (ref 0.00–0.07)
Basophils Absolute: 0 10*3/uL (ref 0.0–0.1)
Basophils Relative: 1 %
Eosinophils Absolute: 0 10*3/uL (ref 0.0–0.5)
Eosinophils Relative: 1 %
HCT: 27.7 % — ABNORMAL LOW (ref 39.0–52.0)
Hemoglobin: 9.1 g/dL — ABNORMAL LOW (ref 13.0–17.0)
Immature Granulocytes: 2 %
Lymphocytes Relative: 7 %
Lymphs Abs: 0.3 10*3/uL — ABNORMAL LOW (ref 0.7–4.0)
MCH: 45.7 pg — ABNORMAL HIGH (ref 26.0–34.0)
MCHC: 32.9 g/dL (ref 30.0–36.0)
MCV: 139.2 fL — ABNORMAL HIGH (ref 80.0–100.0)
Monocytes Absolute: 0.3 10*3/uL (ref 0.1–1.0)
Monocytes Relative: 6 %
Neutro Abs: 3.3 10*3/uL (ref 1.7–7.7)
Neutrophils Relative %: 83 %
Platelets: 379 10*3/uL (ref 150–400)
RBC: 1.99 MIL/uL — ABNORMAL LOW (ref 4.22–5.81)
RDW: 15.9 % — ABNORMAL HIGH (ref 11.5–15.5)
Smear Review: ADEQUATE
WBC: 4 10*3/uL (ref 4.0–10.5)
nRBC: 0 % (ref 0.0–0.2)

## 2023-08-22 LAB — IRON AND TIBC
Iron: 87 ug/dL (ref 45–182)
Saturation Ratios: 21 % (ref 17.9–39.5)
TIBC: 415 ug/dL (ref 250–450)
UIBC: 328 ug/dL

## 2023-08-22 LAB — FERRITIN: Ferritin: 30 ng/mL (ref 24–336)

## 2023-08-22 LAB — VITAMIN B12: Vitamin B-12: 1311 pg/mL — ABNORMAL HIGH (ref 180–914)

## 2023-08-22 LAB — LACTATE DEHYDROGENASE: LDH: 268 U/L — ABNORMAL HIGH (ref 98–192)

## 2023-08-27 ENCOUNTER — Encounter: Payer: Self-pay | Admitting: Student

## 2023-08-27 ENCOUNTER — Ambulatory Visit: Attending: Student | Admitting: Student

## 2023-08-27 VITALS — BP 110/70 | HR 71 | Resp 16 | Ht 69.0 in | Wt 156.8 lb

## 2023-08-27 DIAGNOSIS — I5022 Chronic systolic (congestive) heart failure: Secondary | ICD-10-CM

## 2023-08-27 DIAGNOSIS — Z952 Presence of prosthetic heart valve: Secondary | ICD-10-CM | POA: Diagnosis not present

## 2023-08-27 DIAGNOSIS — I2510 Atherosclerotic heart disease of native coronary artery without angina pectoris: Secondary | ICD-10-CM

## 2023-08-27 DIAGNOSIS — F1010 Alcohol abuse, uncomplicated: Secondary | ICD-10-CM | POA: Diagnosis not present

## 2023-08-27 DIAGNOSIS — Z5181 Encounter for therapeutic drug level monitoring: Secondary | ICD-10-CM | POA: Diagnosis not present

## 2023-08-27 DIAGNOSIS — I255 Ischemic cardiomyopathy: Secondary | ICD-10-CM

## 2023-08-27 DIAGNOSIS — I739 Peripheral vascular disease, unspecified: Secondary | ICD-10-CM

## 2023-08-27 LAB — CUP PACEART INCLINIC DEVICE CHECK
Date Time Interrogation Session: 20250331124221
HighPow Impedance: 60 Ohm
Implantable Lead Connection Status: 753985
Implantable Lead Connection Status: 753985
Implantable Lead Connection Status: 753985
Implantable Lead Implant Date: 20180105
Implantable Lead Implant Date: 20180105
Implantable Lead Implant Date: 20180105
Implantable Lead Location: 753858
Implantable Lead Location: 753859
Implantable Lead Location: 753860
Implantable Lead Model: 292
Implantable Lead Model: 4672
Implantable Lead Model: 7741
Implantable Lead Serial Number: 428973
Implantable Lead Serial Number: 801088
Implantable Lead Serial Number: 849519
Implantable Pulse Generator Implant Date: 20180105
Lead Channel Impedance Value: 582 Ohm
Lead Channel Impedance Value: 599 Ohm
Lead Channel Impedance Value: 659 Ohm
Lead Channel Pacing Threshold Amplitude: 0.7 V
Lead Channel Pacing Threshold Amplitude: 0.8 V
Lead Channel Pacing Threshold Amplitude: 1 V
Lead Channel Pacing Threshold Pulse Width: 0.4 ms
Lead Channel Pacing Threshold Pulse Width: 0.4 ms
Lead Channel Pacing Threshold Pulse Width: 1 ms
Lead Channel Sensing Intrinsic Amplitude: 1 mV
Lead Channel Setting Pacing Amplitude: 2 V
Lead Channel Setting Pacing Amplitude: 2 V
Lead Channel Setting Pacing Amplitude: 2.2 V
Lead Channel Setting Pacing Pulse Width: 0.4 ms
Lead Channel Setting Pacing Pulse Width: 1 ms
Lead Channel Setting Sensing Sensitivity: 0.6 mV
Lead Channel Setting Sensing Sensitivity: 1 mV
Pulse Gen Serial Number: 180139
Zone Setting Status: 755011

## 2023-08-27 LAB — METHYLMALONIC ACID, SERUM: Methylmalonic Acid, Quantitative: 398 nmol/L — ABNORMAL HIGH (ref 0–378)

## 2023-08-27 NOTE — Patient Instructions (Signed)
 Medication Instructions:  Your physician recommends that you continue on your current medications as directed. Please refer to the Current Medication list given to you today.  *If you need a refill on your cardiac medications before your next appointment, please call your pharmacy*  Lab Work: ProBNP-TODAY If you have labs (blood work) drawn today and your tests are completely normal, you will receive your results only by: MyChart Message (if you have MyChart) OR A paper copy in the mail If you have any lab test that is abnormal or we need to change your treatment, we will call you to review the results.  Follow-Up: At [facility], you and your health needs are our priority.  As part of our continuing mission to provide you with exceptional heart care, our providers are all part of one team.  This team includes your primary Cardiologist (physician) and Advanced Practice Providers or APPs (Physician Assistants and Nurse Practitioners) who all work together to provide you with the care you need, when you need it.  Your next appointment:   6 month(s)  Provider:   Casimiro Needle "Mardelle Matte" Lanna Poche, PA-C      1st Floor: - Lobby - Registration  - Pharmacy  - Lab - Cafe  2nd Floor: - PV Lab - Diagnostic Testing (echo, CT, nuclear med)  3rd Floor: - Vacant  4th Floor: - TCTS (cardiothoracic surgery) - AFib Clinic - Structural Heart Clinic - Vascular Surgery  - Vascular Ultrasound  5th Floor: - HeartCare Cardiology (general and EP) - Clinical Pharmacy for coumadin, hypertension, lipid, weight-loss medications, and med management appointments    Valet parking services will be available as well.

## 2023-08-27 NOTE — Progress Notes (Signed)
 Electrophysiology Office Note:   ID:  Taylor [name], [place] 1951-02-20, MRN [id number]  Primary Cardiologist: None Electrophysiologist: Will Jorja Loa, MD      History of Present Illness:   Taylor [name] is a 73 y.o. male with h/o AoV abnormality s/p mechanical AVR (on coumadin), h/o VF arrest in 2017 s/p CRT-D, CAD s/p CABG x3 + redo mAVR, chronic systolic HF (EF 20-25%), paroxysmal AF, AAA s/p EVAR with b/l fem endarterectomies c/b thrombosis of L fem and foot ulcer, essential thrombosis, ETOH, cocaine, and tobacco abuse seen today for routine electrophysiology followup and for discussion of adjunctive device therapy.  Seen in HF clinic 08/01/2023. Overall doing OK. Has DOB walking up hills and stairs. He has chronic rest pain in BLE with chronic LLE edema, and has been receiving Soft Wave Therapy per VVS. He was hopeful small device therapy (BAT vs CCM) would help further with this and his HF symptoms. Denies CP, PND, or orthopnea. Tolerating medications well.   Review of systems complete and found to be negative unless listed in HPI.   EP Information / Studies Reviewed:    EKG is not ordered today.        ICD Interrogation-  reviewed in detail today,  See PACEART report.  Arrhythmia/Device History [employer] CRT-D  HF MD--Dr Bensimhon  Vynaqel from [facility] 11/30/17   Physical Exam:   VS:  BP 110/70 (BP Location: Left Arm, Patient Position: Sitting, Cuff Size: Normal)   Pulse 71   Resp 16   Ht 5\' 9"  (1.753 m)   Wt 156 lb 12.8 oz (71.1 kg)   SpO2 93%   BMI 23.16 kg/m    Wt Readings from Last 3 Encounters:  08/27/23 156 lb 12.8 oz (71.1 kg)  08/01/23 157 lb 3.2 oz (71.3 kg)  05/21/23 159 lb 12.8 oz (72.5 kg)     GEN: No acute distress  NECK: No JVD; No carotid bruits CARDIAC: Regular rate and rhythm, no murmurs, rubs, gallops RESPIRATORY:  Clear to auscultation without rales, wheezing or rhonchi  ABDOMEN: Soft, non-tender,  non-distended EXTREMITIES:  1+ edema; chronic component. No deformity   ASSESSMENT AND PLAN:    Chronic systolic CHF  s/p [employer] CRT-D  euvolemic today Stable on an appropriate medical regimen Normal ICD function See Pace Art report No changes today Not candidate for CCM with 3 leads already in We discuss BAT today and he is interested. Will send Pro BNP today and discuss with Dr. Myra Gianotti given his h/o vascular disease  H/o VT/VF Continue amiodarone 100 mg q am and 200 mg q evening CMET stable 08/22/23  CAD Denies s/s ischemia currently  S/p Mechanical AV On coumadin  PAD with AAA Followed by VVS and Podiatry. Functionally limiting somewhat  ETOH abuse Tells HF team he is still drinking "too much".   We discussed adjunctive therapies for HF at length today.   Currently, our team is not offering CCM in pts with pre-existing CRT devices due to the possible complications and increased risk associated with 5 transvenous leads.   Taylor [name]'s heart failure has failed to improve despite titration of guideline directed medication such that he qualifies for the [facility] NEO device. The following information from the patient's medical record supports the medical necessity of this procedure for my patient, consistent with the FDA on-label indication for BAROSTIM NEO:  ? LVEF of 25-30%  confirmed by Echo on 10/2022   ? NT-proBNP of <1600 pg/ml  = Labs to  be drawn today, 08/27/23  ?Symptomatic despite medication management of: diuretic, beta blocker, Aldosterone inhibitor, and SGLT2 inhibitor as evidenced by symptoms below.  Not on ACE/ARB/ARNi due to renal dysfunction.  ? This patients signs and symptoms of heart failure include "dyspnea with mild to moderate exertion, edema, fatigue, weakness, and elevated jugular vein pulse (JVP)   ? NYHA Congestive Heart Failure Classification: III  ? Recent hospitalization for Heart Failure on (not applicable for this  patient)   This patient has previously undergone cardiac resynchronization therapy but has continued to have NYHA III symptoms of CHF.    Despite the above, he has significant vascular disease and PAD, and would likely be a higher risk candidate.  Will send Pro-BNP and discuss with vascular surgeon if risk is felt surmountable.    Disposition:   Follow up with EP APP in 6 months as a placeholder; otherwise would follow up for usual BAT management if proceeds.    Signed, Graciella Freer, PA-C

## 2023-08-28 ENCOUNTER — Ambulatory Visit: Attending: Internal Medicine | Admitting: *Deleted

## 2023-08-28 DIAGNOSIS — Z5181 Encounter for therapeutic drug level monitoring: Secondary | ICD-10-CM | POA: Diagnosis not present

## 2023-08-28 DIAGNOSIS — Z952 Presence of prosthetic heart valve: Secondary | ICD-10-CM | POA: Diagnosis not present

## 2023-08-28 LAB — POCT INR: INR: 3 (ref 2.0–3.0)

## 2023-08-28 NOTE — Patient Instructions (Signed)
 Continue warfarin 1/2 tablet daily  Eat salad tonight On Amiodarone 300mg  daily Recheck INR 3 wks

## 2023-08-29 ENCOUNTER — Ambulatory Visit: Payer: Medicare Other | Admitting: Oncology

## 2023-08-29 ENCOUNTER — Inpatient Hospital Stay: Payer: Medicare Other | Attending: Hematology | Admitting: Oncology

## 2023-08-29 ENCOUNTER — Other Ambulatory Visit: Payer: Self-pay | Admitting: Cardiology

## 2023-08-29 ENCOUNTER — Other Ambulatory Visit: Payer: Self-pay | Admitting: Physician Assistant

## 2023-08-29 VITALS — BP 107/96 | HR 76 | Resp 16 | Wt 160.7 lb

## 2023-08-29 DIAGNOSIS — Z952 Presence of prosthetic heart valve: Secondary | ICD-10-CM

## 2023-08-29 DIAGNOSIS — D649 Anemia, unspecified: Secondary | ICD-10-CM | POA: Insufficient documentation

## 2023-08-29 DIAGNOSIS — D72819 Decreased white blood cell count, unspecified: Secondary | ICD-10-CM | POA: Insufficient documentation

## 2023-08-29 DIAGNOSIS — D473 Essential (hemorrhagic) thrombocythemia: Secondary | ICD-10-CM | POA: Insufficient documentation

## 2023-08-29 MED ORDER — HYDROXYUREA 500 MG PO CAPS: ORAL_CAPSULE | ORAL | 4 refills | Status: DC

## 2023-08-29 NOTE — Telephone Encounter (Signed)
 Prescription refill request received for warfarin Lov: 08/27/23 Taylor [name])  Next INR check: 09/19/23 Warfarin tablet strength: 5mg   Appropriate dose. Refill sent.
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
## 2023-08-30 ENCOUNTER — Ambulatory Visit (HOSPITAL_COMMUNITY)
Admission: RE | Admit: 2023-08-30 | Discharge: 2023-08-30 | Disposition: A | Discharge status: Home / Self Care | Source: Ambulatory Visit | Attending: Internal Medicine | Admitting: Internal Medicine

## 2023-08-30 ENCOUNTER — Encounter (HOSPITAL_COMMUNITY): Payer: Self-pay | Admitting: Internal Medicine

## 2023-08-30 VITALS — BP 104/52 | HR 77 | Ht 69.0 in | Wt 158.6 lb

## 2023-08-30 DIAGNOSIS — I130 Hypertensive heart and chronic kidney disease with heart failure and stage 1 through stage 4 chronic kidney disease, or unspecified chronic kidney disease: Secondary | ICD-10-CM | POA: Diagnosis not present

## 2023-08-30 DIAGNOSIS — Z8674 Personal history of sudden cardiac arrest: Secondary | ICD-10-CM | POA: Insufficient documentation

## 2023-08-30 DIAGNOSIS — Z79899 Other long term (current) drug therapy: Secondary | ICD-10-CM | POA: Diagnosis not present

## 2023-08-30 DIAGNOSIS — I2510 Atherosclerotic heart disease of native coronary artery without angina pectoris: Secondary | ICD-10-CM | POA: Insufficient documentation

## 2023-08-30 DIAGNOSIS — Z7982 Long term (current) use of aspirin: Secondary | ICD-10-CM | POA: Insufficient documentation

## 2023-08-30 DIAGNOSIS — Z87891 Personal history of nicotine dependence: Secondary | ICD-10-CM | POA: Diagnosis not present

## 2023-08-30 DIAGNOSIS — I472 Ventricular tachycardia, unspecified: Secondary | ICD-10-CM

## 2023-08-30 DIAGNOSIS — Z95810 Presence of automatic (implantable) cardiac defibrillator: Secondary | ICD-10-CM | POA: Insufficient documentation

## 2023-08-30 DIAGNOSIS — I5022 Chronic systolic (congestive) heart failure: Secondary | ICD-10-CM | POA: Diagnosis present

## 2023-08-30 DIAGNOSIS — I714 Abdominal aortic aneurysm, without rupture, unspecified: Secondary | ICD-10-CM | POA: Insufficient documentation

## 2023-08-30 DIAGNOSIS — Z7901 Long term (current) use of anticoagulants: Secondary | ICD-10-CM | POA: Insufficient documentation

## 2023-08-30 DIAGNOSIS — Z955 Presence of coronary angioplasty implant and graft: Secondary | ICD-10-CM | POA: Diagnosis not present

## 2023-08-30 DIAGNOSIS — I739 Peripheral vascular disease, unspecified: Secondary | ICD-10-CM | POA: Diagnosis not present

## 2023-08-30 DIAGNOSIS — N189 Chronic kidney disease, unspecified: Secondary | ICD-10-CM | POA: Diagnosis not present

## 2023-08-30 DIAGNOSIS — Z952 Presence of prosthetic heart valve: Secondary | ICD-10-CM | POA: Diagnosis not present

## 2023-08-30 DIAGNOSIS — Z7984 Long term (current) use of oral hypoglycemic drugs: Secondary | ICD-10-CM | POA: Diagnosis not present

## 2023-08-30 DIAGNOSIS — I252 Old myocardial infarction: Secondary | ICD-10-CM | POA: Diagnosis not present

## 2023-08-30 LAB — PRO B NATRIURETIC PEPTIDE: NT-Pro BNP: 9442 pg/mL — ABNORMAL HIGH (ref 0–376)

## 2023-08-30 NOTE — Patient Instructions (Signed)
 Great to see you today!!!  Medication Changes:  None, continue current medications  Testing/Procedures:  Your physician has requested that you have an echocardiogram. Echocardiography is a painless test that uses sound waves to create images of your heart. It provides your doctor with information about the size and shape of your heart and how well your heart's chambers and valves are working. This procedure takes approximately one hour. There are no restrictions for this procedure. Please do NOT wear cologne, perfume, aftershave, or lotions (deodorant is allowed). Please arrive 15 minutes prior to your appointment time.  Please note: We ask at that you not bring children with you during ultrasound (echo/ vascular) testing. Due to room size and safety concerns, children are not allowed in the ultrasound rooms during exams. Our front office staff cannot provide observation of children in our lobby area while testing is being conducted. An adult accompanying a patient to their appointment will only be allowed in the ultrasound room at the discretion of the ultrasound technician under special circumstances. We apologize for any inconvenience.  Special Instructions // Education:  Do the following things EVERYDAY: Weigh yourself in the morning before breakfast. Write it down and keep it in a log. Take your medicines as prescribed Eat low salt foods--Limit salt (sodium) to 2000 mg per day.  Stay as active as you can everyday Limit all fluids for the day to less than 2 liters   Follow-Up in: 2 months   At the [facility], you and your health needs are our priority. We have a designated team specialized in the treatment of Heart Failure. This Care Team includes your primary Heart Failure Specialized Cardiologist (physician), Advanced Practice Providers (APPs- Physician Assistants and Nurse Practitioners), and Pharmacist who all work together to provide you with the care you need,  when you need it.   You may see any of the following providers on your designated Care Team at your next follow up:  Dr. Arvilla Meres Dr. Marca Ancona Dr. Dorthula Nettles Dr. Theresia Bough Tonye Becket, NP Robbie Lis, [place] [facility], [place] Brynda Peon, NP [place] Lee, NP Karle Plumber, PharmD   Please be sure to bring in all your medications bottles to every appointment.   Need to Contact us:  If you have any questions or concerns before your next appointment please send [place] a message through [place] or call our office at [phone].    TO LEAVE A MESSAGE FOR THE NURSE SELECT OPTION 2, PLEASE LEAVE A MESSAGE INCLUDING: YOUR NAME DATE OF BIRTH CALL BACK NUMBER REASON FOR CALL**this is important as we prioritize the call backs  YOU WILL RECEIVE A CALL BACK THE SAME DAY AS LONG AS YOU CALL BEFORE 4:00 PM

## 2023-08-30 NOTE — Progress Notes (Signed)
 [facility] Note    PCP: Renaldo Harrison, DO HF Cardiologist: Dr. Gala Romney   HPI: Taylor [name] is a 73 y.o.. male with mechnical AVR on chronic coumadin, h/o VF arrest s/p ICD, CAD s/p CABG, chronic systolic HF (EF 20-25%), AAA and h/o essential thrombosis.    Has h/o congenital AoV abnormality with AI. In 1994 had MI and underwent PCI (unclear artery) and mechanical AVR at [facility].    Has h/o polysubstance abuse with cocaine, ETOH and tobacco abuse. No longer doing cocaine or smoking.    Did well from heart perspective until 2017, when he had VF arrest on 05/18/16. Found to have 3v CAD and MV disease and underwent CABG x 3 with re-do mechanical AVR. Post-operatively had AF (treated with amio) and CHB requiring implantation of a BosSci CRT-D device. This is followed by Dr. Graciela Husbands. Coumadin Clinic at [facility] street follows INR. INR goal 2.0-3.0.    Echo on 12/18 showed LVEF of 25-30% stable AVR   Had CT on 12/18 for his back. Showed 3.7cm AAA. Had f/u US 05/2018 that showed mild increase in size of the known distal fusiform abdominal aortic aneurysm, measuring 4.2 cm in maximum diameter. He was referred to [facility] and he is now followed by Dr. Darrick Penna.     Echo 10/20 showed EF 20-25%. RV normal. A  Admitted 1/21 w/ acute cholecystitis and sepsis. Hypotensive on admit w/ AKI and HF meds held. SCr bumped to 1.7 (baseline 1.1). Treated w/ IVFs and abx. Blood cultures negative. General surgery consulted and plans were for percutaneous cholecystostomy w/ drain, however HIDA scan was negative and he improved symptomatically w/ conservative therapy.   Echo 02/03/21: EF 25-30%, AVR looks good.   In [facility] 2/23, EP unsuccessful with pace termination. Underwent DCCV to [facility] (4/23) but had ERAF about a week later.  Echo (5/23) showed EF 25-30%, grade II DD, RV ok, s/p AVR with mild AI  S/p CTI ablation 7/23 at [facility] with Dr. Jean Rosenthal. Patient called [facility] 8/23 with 3 ICD therapies. He notified Dr.  Graciela Husbands who increased amio.  Saw EP 11/23, dizzy spells improved since fixing antifreeze leak in his car. Device interrogation showed recurrent MMVT and amio increased to 200 bid x 2 weeks then 200 daily.   In 6/24 EVAR for AAA requiring bilateral common femoral endarterectomies.  He had to go back on 11/10/2022 for a thrombosed left common femoral artery after endarterectomy. Then went to [facility] for left fem-pop. Course c/b LLE foot ulcer  Returns today for HF f/u. Feels ok. Has wounds on both feet. Seeing Poridatry at [facility] and referred for hyperbaric treatment. No CP. SOB is worsening. + edema. Recently double lasix to 40 daily and peeing much more.. No orthopnea or PND   ReDS 36% Personally reviewed   Past Medical History:  Diagnosis Date   Abdominal aortic aneurysm (AAA) (HCC)    AICD (automatic cardioverter/defibrillator) present    Atherosclerotic heart disease of native coronary artery without angina pectoris    Chronic anticoagulation 02/18/2018   Chronic systolic heart failure (HCC) 02/02/2017   Essential thrombocythemia (HCC) 02/15/2017   Hyperlipidemia    Hypertension    Hypothyroidism    Ischemic cardiomyopathy    Leg pain, central, left 04/17/2017   Hx sciatica - pain similar; no red flag signs on hx or exam    Presence of other heart-valve replacement    Presence of permanent cardiac pacemaker    Thrombocytosis    pt states he takes  a "chemo pill" for this   Ventricular tachycardia (HCC)    Current Outpatient Medications  Medication Sig Dispense Refill   amiodarone (PACERONE) 200 MG tablet TAKE 0.5 TABLET BY MOUTH EVERY MORNING AND 1 TABLET EVERY EVENING 135 tablet 3   amoxicillin (AMOXIL) 500 MG capsule Take 4 capsules (2.000mg ) one hour before dental procedures. 4 capsule 0   Ascorbic Acid ([employer] VITAMIN C PO) Take 1 tablet by mouth daily.     aspirin 81 MG tablet Take 81 mg by mouth daily.     carvedilol (COREG) 3.125 MG tablet Take 1 tablet (3.125 mg total) by mouth 2  (two) times daily with a meal. 180 tablet 3   Cholecalciferol (VITAMIN D) 50 MCG (2000 UT) tablet Take 2,000 Units by mouth daily.     dapagliflozin propanediol (FARXIGA) 10 MG TABS tablet Take 1 tablet (10 mg total) by mouth daily. 90 tablet 3   ferrous sulfate 325 (65 FE) MG tablet Take 325 mg by mouth daily with breakfast.     furosemide (LASIX) 20 MG tablet Take 2 tablets (40 mg total) by mouth daily. 60 tablet 11   hydroxyurea (HYDREA) 500 MG capsule Take 3 tablet (1500 mg )by mouth daily on Mon,Wed,Friday and 2 tabs (1000 mg) on Tues, Thursday, Sat and Sunday 180 capsule 4   levothyroxine (SYNTHROID) 75 MCG tablet Take 75 mcg by mouth daily before breakfast.     potassium chloride (KLOR-CON M) 10 MEQ tablet Take 4 tablets (40 mEq total) by mouth daily. (Patient taking differently: Take 20 mEq by mouth 2 (two) times daily. Take 1 tablet by mouth twice daily.) 120 tablet 11   spironolactone (ALDACTONE) 25 MG tablet Take 1 tablet (25 mg total) by mouth every other day. 45 tablet 3   traZODone (DESYREL) 50 MG tablet Take 50 mg by mouth at bedtime.     vitamin B-12 (CYANOCOBALAMIN) 1000 MCG tablet Take 1,000 mcg by mouth daily.     warfarin (COUMADIN) 5 MG tablet TAKE 1/2 TABLET BY MOUTH DAILY OR AS DIRECTED BY COUMADIN CLINIC. 50 tablet 1   No current facility-administered medications for this encounter.   Allergies  Allergen Reactions   Erythromycin Rash   Rosuvastatin Calcium     Muscle cramps   Simvastatin     Muscle cramps   Zithromax [Azithromycin] Rash   Social History   Socioeconomic History   Marital status: Divorced    Spouse name: Not on file   Number of children: 2   Years of education: Not on file   Highest education level: Not on file  Occupational History   Not on file  Tobacco Use   Smoking status: Former    Current packs/day: 0.00    Average packs/day: 1 pack/day for 37.0 years (37.0 ttl pk-yrs)    Types: Cigarettes    Start date: 05/26/1979    Quit date:  05/18/2016    Years since quitting: 7.2    Passive exposure: Never   Smokeless tobacco: Never  Vaping Use   Vaping status: Never Used  Substance and Sexual Activity   Alcohol use: Yes    Alcohol/week: 10.0 standard drinks of alcohol    Types: 10 Cans of beer per week    Comment: 2 beers a day max (sometimes just one a day)   Drug use: No   Sexual activity: Not on file  Other Topics Concern   Not on file  Social History Narrative   Not on file   Social  Drivers of Health   Financial Resource Strain: Patient Unable To Answer (01/10/2023)   Received from [facility]   Overall Financial Resource Strain (CARDIA)    Difficulty of Paying Living Expenses: Patient unable to answer  Food Insecurity: Patient Unable To Answer (01/10/2023)   Received from [facility]   Hunger Vital Sign    Worried About Running Out of Food in the Last Year: Patient unable to answer    Ran Out of Food in the Last Year: Patient unable to answer  Transportation Needs: Patient Unable To Answer (01/10/2023)   Received from [facility]   PRAPARE - Transportation    In the past 12 months, has lack of transportation kept you from medical appointments or from getting medications?: Patient unable to answer    Lack of Transportation (Non-Medical): Patient unable to answer  Physical Activity: Not on file  Stress: Not on file  Social Connections: Not on file  Intimate Partner Violence: Not At Risk (04/03/2022)   Humiliation, Afraid, Rape, and Kick questionnaire    Fear of Current or Ex-Partner: No    Emotionally Abused: No    Physically Abused: No    Sexually Abused: No   Family History  Problem Relation Age of Onset   Hypertension Father    BP (!) 104/52   Pulse 77   Ht 5\' 9"  (1.753 m)   Wt 71.9 kg (158 lb 9.6 oz)   SpO2 96%   BMI 23.42 kg/m   Wt Readings from Last 3 Encounters:  08/30/23 71.9 kg (158 lb 9.6 oz)  08/29/23 72.9 kg (160 lb 11.2 oz)   08/27/23 71.1 kg (156 lb 12.8 oz)   PHYSICAL EXAM: General:  NAD. No resp difficulty, in WC, chronically-ill appearing HEENT: normal Neck: supple. JVP 7-8. Carotids 2+ bilat; no bruits. No lymphadenopathy or thryomegaly appreciated. Cor: PMI nondisplaced. Regular rate & rhythm. Mech s2 Lungs: clear Abdomen: soft, nontender, nondistended. No hepatosplenomegaly. No bruits or masses. Good bowel sounds. Extremities: no cyanosis, clubbing, rash, 2+ edema Neuro: alert & orientedx3, cranial nerves grossly intact. moves all 4 extremities w/o difficulty. Affect pleasant   Device interrogation (personally reviewed): No AF or VT. Personally reviewed  ASSESSMENT & PLAN:  1. Chronic Systolic Heart Failure - Echo (05/2016 @ [facility]): EF 40% - Echo (12/18) EF 25-30% - Echo (12/20): EF 20-25%. Diffuse hypokinesis, No LVH. RV normal. Mild AI, trace MR - s/p CRT-D.  - Echo 5/23 EF 25-30% - Echo 6/24 EF 25-30%  - Continues to struggle with NYHA IIIB symptoms - Mild volume overload on exam.  - Lasix recently increased. ReDS pretty good at 36% today though still with 2+ LE edema - Continue Lasix 40mg  daily  - Continue Farxiga 10 mg daily. - Continue Coreg 3.125 mg bid.  - Continue spironolactone 25 mg every other day  - Off losartan with CKD and low BP - Labs today - Repeat echo - Wrap legs with ACE gently as I suspect some of edema in dependent edema as he is less mobile - We discussed potential RHC to further evaluate but we have decided to defer for now. Will repeat echo - Has failed eval for Barostim and CCM - Currently not candidate for advanced therapies  2. CAD - h/o remote PCI in 1994 at [facility] - s/p LHC in 2017 in the setting of Vfib Arrest w/ 3V CAD treated w/ CABG x 3 - No s/s angina - Continue ASA, statin and ?  blocker. - Goal LDL < 70   4. Mechanical Aortic Valve - Stable on recent echo. - On coumadin. INR followed by coumadin clinic - No bleeding - Aware of SBE  prophylaxis  5. H/o VF Arrest and H/o CHB - s/p BoSci CRT-D device, followed by EP (Dr. Graciela Husbands) - Continue amiodarone 100 q am/200 q pm, per EP. - No VT on device interrogation today  6. PAD with AAA and LE wounds - 6/24 EVAR for AAA requiring bilateral common femoral endarterectomies.  He had to go back on 11/10/2022 for a thrombosed left common femoral artery after endarterectomy. Then went to [facility] for left fem-pop. Course c/b LLE foot ulcer - Followed by [facility] and Podiatry. Pending hyperbaric treatments - Continue statin + ASA  I spent a total of 45 minutes today: 1) reviewing the patient's medical records including previous charts, labs and recent notes from other providers; 2) examining the patient and counseling them on their medical issues/explaining the plan of care; 3) adjusting meds as needed and 4) ordering lab work or other needed tests.    Arvilla Meres, MD 08/30/23

## 2023-09-03 ENCOUNTER — Encounter (HOSPITAL_COMMUNITY): Payer: Self-pay | Admitting: Internal Medicine

## 2023-09-03 ENCOUNTER — Telehealth: Payer: Self-pay | Admitting: Student

## 2023-09-03 NOTE — Telephone Encounter (Signed)
 Patient called and stated he was returning Mindy phone call about repeating labs.

## 2023-09-03 NOTE — Telephone Encounter (Signed)
 Returned call to pt, he has reviewed results in [place] and he also stated that Dr Gala Romney reviewed them with him at his visit. No further questions or concerns for [place] at this time.

## 2023-09-03 NOTE — Telephone Encounter (Signed)
 Pt returning call for lab results

## 2023-09-07 NOTE — Addendum Note (Signed)
 Addended by: Elease Etienne A on: 09/07/2023 10:16 AM   Modules accepted: Orders

## 2023-09-07 NOTE — Progress Notes (Signed)
 Remote ICD transmission.

## 2023-09-13 ENCOUNTER — Encounter (HOSPITAL_COMMUNITY): Payer: Self-pay | Admitting: Internal Medicine

## 2023-09-13 NOTE — Telephone Encounter (Signed)
 Per the instructions from [facility], his last dose of warfarin should be taken on 4/18. He should NOT start lovenox until 4/21 and continue taking lovenox twice daily until 4/23. He should only take one dose of lovenox on 4/23 and this should be given before 7AM. To reiterate the above, he should not start taking lovenox until 4/21. This is because he is already anticoagulated with the warfarin (INR was 3.5 on last check) and they do not want him to start the lovenox until the INR has down trended to a subtherapeutic level. This will reduce the risk of bleeding. If he has any further questions, he should reach out to [facility] as they are the ones managing his lovenox bride for the procedure.

## 2023-09-13 NOTE — Telephone Encounter (Signed)
 Per preop visit with [facility] 4/11  - Pt noted previously he has held his coumadin x 3 days prior to surgeries, which may be reasonable given no planned spinal or regional anesthesia per posting and may be even preferred rather than standard of 5 days given this would alleviate need for lovenox bridge, which would otherwise be indicated given high risk of thrombus with CHADS2vasc score = 6 and additional of essential thrombocytosi   4/18 Last dose of coumadin/warfarin  4/19 NO coumadin/warfarin  4/20 NO coumadin/warfarin  4/21 NO coumadin/warfarin Take Lovenox every 12 hrs (am & pm)  4/22 NO coumadin/warfarin Take Lovenox every 12 hrs (am & pm)  4/23 NO coumadin/warfarin Last dose Lovenox early morning (before 7am) ONLY  DayOfSurgery: 4/24 NO coumadin/warfarin NO Lovenox

## 2023-09-17 ENCOUNTER — Telehealth (HOSPITAL_COMMUNITY): Payer: Self-pay

## 2023-09-17 NOTE — Telephone Encounter (Signed)
  [facility]   Pre-operative Risk Assessment       Request for Surgical Clearance    Procedure:   Debridement, Ankle/Foot/Toe { Date of Surgery:  Clearance 09/19/23                               Surgeon:  Dr. Arn Beth Surgeon's Group or Practice Name:  [facility] Phone number:  [phone] Fax number:  [phone]  Type of Clearance Requested:   - Medical  - Pharmacy:  Hold Aspirin  and Warfarin (Coumadin ) Looks like pharmacy has handled medication portion  { Type of Anesthesia:  MAC   Signed, Arnell Slivinski B Jourdain Guay   09/17/2023, 3:27 PM

## 2023-09-18 NOTE — Telephone Encounter (Signed)
 [place], [place], [place]  You57 minutes ago (8:43 AM)    He is at least a moderate CV risk for this low risk procedure, not prohibitive though.   Copy of clearance routed/faxed via Epic fax function to requesting office.

## 2023-09-19 ENCOUNTER — Encounter

## 2023-09-21 ENCOUNTER — Ambulatory Visit (HOSPITAL_COMMUNITY)

## 2023-09-27 ENCOUNTER — Ambulatory Visit (HOSPITAL_COMMUNITY)
Admission: RE | Admit: 2023-09-27 | Discharge: 2023-09-27 | Disposition: A | Discharge status: Home / Self Care | Source: Ambulatory Visit | Attending: Internal Medicine | Admitting: Internal Medicine

## 2023-09-27 DIAGNOSIS — I429 Cardiomyopathy, unspecified: Secondary | ICD-10-CM | POA: Insufficient documentation

## 2023-09-27 DIAGNOSIS — I110 Hypertensive heart disease with heart failure: Secondary | ICD-10-CM | POA: Insufficient documentation

## 2023-09-27 DIAGNOSIS — I081 Rheumatic disorders of both mitral and tricuspid valves: Secondary | ICD-10-CM | POA: Insufficient documentation

## 2023-09-27 DIAGNOSIS — Z952 Presence of prosthetic heart valve: Secondary | ICD-10-CM | POA: Diagnosis not present

## 2023-09-27 DIAGNOSIS — I5022 Chronic systolic (congestive) heart failure: Secondary | ICD-10-CM | POA: Diagnosis present

## 2023-09-27 DIAGNOSIS — I4892 Unspecified atrial flutter: Secondary | ICD-10-CM | POA: Diagnosis not present

## 2023-09-27 LAB — ECHOCARDIOGRAM COMPLETE
AV Mean grad: 8 mmHg
AV Peak grad: 15.3 mmHg
Ao pk vel: 1.96 m/s
Area-P 1/2: 3.01 cm2
Calc EF: 20.3 %
Radius: 0.7 cm
S' Lateral: 5.9 cm
Single Plane A2C EF: 23.9 %
Single Plane A4C EF: 17 %

## 2023-10-01 ENCOUNTER — Encounter (HOSPITAL_COMMUNITY)

## 2023-10-01 ENCOUNTER — Encounter

## 2023-10-02 ENCOUNTER — Ambulatory Visit: Attending: Internal Medicine | Admitting: *Deleted

## 2023-10-02 DIAGNOSIS — Z952 Presence of prosthetic heart valve: Secondary | ICD-10-CM | POA: Diagnosis not present

## 2023-10-02 DIAGNOSIS — Z5181 Encounter for therapeutic drug level monitoring: Secondary | ICD-10-CM

## 2023-10-02 LAB — POCT INR: INR: 3.1 — AB (ref 2.0–3.0)

## 2023-10-02 NOTE — Patient Instructions (Signed)
 Continue warfarin 1/2 tablet daily  Eat salad tonight On Amiodarone  300mg  daily Recheck INR 2 wks

## 2023-10-17 ENCOUNTER — Ambulatory Visit: Attending: Internal Medicine | Admitting: *Deleted

## 2023-10-17 DIAGNOSIS — Z952 Presence of prosthetic heart valve: Secondary | ICD-10-CM | POA: Diagnosis not present

## 2023-10-17 DIAGNOSIS — Z5181 Encounter for therapeutic drug level monitoring: Secondary | ICD-10-CM

## 2023-10-17 LAB — POCT INR: INR: 7.4 — AB (ref 2.0–3.0)

## 2023-10-17 NOTE — Patient Instructions (Addendum)
 Has been on Cipro  x 15 days for foot wound.  Finished 3-4 days ago. Hold warfarin x 4 days then resume 1/2 tablet daily  Eat salad tonight On Amiodarone  300mg  daily Recheck INR 1 wk Pt denies S/S of bleeding Bleeding and fall precautions discussed with pt and he verbalized understanding.

## 2023-10-24 ENCOUNTER — Ambulatory Visit: Attending: Internal Medicine | Admitting: *Deleted

## 2023-10-24 DIAGNOSIS — Z5181 Encounter for therapeutic drug level monitoring: Secondary | ICD-10-CM | POA: Diagnosis not present

## 2023-10-24 DIAGNOSIS — Z952 Presence of prosthetic heart valve: Secondary | ICD-10-CM | POA: Diagnosis not present

## 2023-10-24 LAB — POCT INR: INR: 3.2 — AB (ref 2.0–3.0)

## 2023-10-24 NOTE — Patient Instructions (Signed)
 Decrease warfarin to 1/2 tablet daily except none on Wednesdays Eat salad tonight On Amiodarone  300mg  daily Recheck INR 1 wk

## 2023-10-30 ENCOUNTER — Ambulatory Visit: Attending: Internal Medicine | Admitting: *Deleted

## 2023-10-30 DIAGNOSIS — Z5181 Encounter for therapeutic drug level monitoring: Secondary | ICD-10-CM | POA: Diagnosis not present

## 2023-10-30 DIAGNOSIS — Z952 Presence of prosthetic heart valve: Secondary | ICD-10-CM | POA: Diagnosis not present

## 2023-10-30 LAB — POCT INR: INR: 3.3 — AB (ref 2.0–3.0)

## 2023-10-30 NOTE — Patient Instructions (Signed)
 Decrease warfarin to 1/2 tablet daily except none on Tuesdays  (Did not decrease last week) On Amiodarone  300mg  daily Recheck INR 1 wk May need to decrease tablet strength

## 2023-11-01 ENCOUNTER — Ambulatory Visit (INDEPENDENT_AMBULATORY_CARE_PROVIDER_SITE_OTHER): Payer: Medicare Other

## 2023-11-01 DIAGNOSIS — I255 Ischemic cardiomyopathy: Secondary | ICD-10-CM

## 2023-11-01 LAB — CUP PACEART REMOTE DEVICE CHECK
Battery Remaining Longevity: 42 mo
Battery Remaining Percentage: 60 %
Brady Statistic RA Percent Paced: 0 %
Brady Statistic RV Percent Paced: 94 %
Date Time Interrogation Session: 20250605004100
HighPow Impedance: 66 Ohm
Implantable Lead Connection Status: 753985
Implantable Lead Connection Status: 753985
Implantable Lead Connection Status: 753985
Implantable Lead Implant Date: 20180105
Implantable Lead Implant Date: 20180105
Implantable Lead Implant Date: 20180105
Implantable Lead Location: 753858
Implantable Lead Location: 753859
Implantable Lead Location: 753860
Implantable Lead Model: 292
Implantable Lead Model: 4672
Implantable Lead Model: 7741
Implantable Lead Serial Number: 428973
Implantable Lead Serial Number: 801088
Implantable Lead Serial Number: 849519
Implantable Pulse Generator Implant Date: 20180105
Lead Channel Impedance Value: 529 Ohm
Lead Channel Impedance Value: 617 Ohm
Lead Channel Impedance Value: 628 Ohm
Lead Channel Pacing Threshold Amplitude: 0.7 V
Lead Channel Pacing Threshold Amplitude: 0.8 V
Lead Channel Pacing Threshold Amplitude: 1 V
Lead Channel Pacing Threshold Pulse Width: 0.4 ms
Lead Channel Pacing Threshold Pulse Width: 0.4 ms
Lead Channel Pacing Threshold Pulse Width: 1 ms
Lead Channel Setting Pacing Amplitude: 2 V
Lead Channel Setting Pacing Amplitude: 2 V
Lead Channel Setting Pacing Amplitude: 2.2 V
Lead Channel Setting Pacing Pulse Width: 0.4 ms
Lead Channel Setting Pacing Pulse Width: 1 ms
Lead Channel Setting Sensing Sensitivity: 0.6 mV
Lead Channel Setting Sensing Sensitivity: 1 mV
Pulse Gen Serial Number: 180139
Zone Setting Status: 755011

## 2023-11-06 ENCOUNTER — Ambulatory Visit: Attending: Internal Medicine | Admitting: *Deleted

## 2023-11-06 DIAGNOSIS — Z952 Presence of prosthetic heart valve: Secondary | ICD-10-CM

## 2023-11-06 DIAGNOSIS — Z5181 Encounter for therapeutic drug level monitoring: Secondary | ICD-10-CM | POA: Diagnosis not present

## 2023-11-06 LAB — POCT INR: INR: 2 (ref 2.0–3.0)

## 2023-11-06 NOTE — Patient Instructions (Signed)
 Take warfarin 1 tablet tonight then resume 1/2 tablet daily except none on Tuesdays   On Amiodarone  300mg  daily Recheck INR 2 wk May need to decrease tablet strength

## 2023-11-12 ENCOUNTER — Ambulatory Visit: Payer: Self-pay | Admitting: Cardiology

## 2023-11-14 ENCOUNTER — Encounter (HOSPITAL_BASED_OUTPATIENT_CLINIC_OR_DEPARTMENT_OTHER): Attending: Internal Medicine | Admitting: Internal Medicine

## 2023-11-14 DIAGNOSIS — I70243 Atherosclerosis of native arteries of left leg with ulceration of ankle: Secondary | ICD-10-CM | POA: Diagnosis not present

## 2023-11-14 DIAGNOSIS — I70235 Atherosclerosis of native arteries of right leg with ulceration of other part of foot: Secondary | ICD-10-CM | POA: Diagnosis not present

## 2023-11-14 DIAGNOSIS — I70245 Atherosclerosis of native arteries of left leg with ulceration of other part of foot: Secondary | ICD-10-CM | POA: Insufficient documentation

## 2023-11-14 DIAGNOSIS — L97518 Non-pressure chronic ulcer of other part of right foot with other specified severity: Secondary | ICD-10-CM | POA: Insufficient documentation

## 2023-11-14 DIAGNOSIS — Z95810 Presence of automatic (implantable) cardiac defibrillator: Secondary | ICD-10-CM | POA: Diagnosis not present

## 2023-11-14 DIAGNOSIS — I5022 Chronic systolic (congestive) heart failure: Secondary | ICD-10-CM | POA: Insufficient documentation

## 2023-11-14 DIAGNOSIS — L97528 Non-pressure chronic ulcer of other part of left foot with other specified severity: Secondary | ICD-10-CM | POA: Diagnosis not present

## 2023-11-16 ENCOUNTER — Other Ambulatory Visit (HOSPITAL_COMMUNITY): Payer: Self-pay | Admitting: Internal Medicine

## 2023-11-16 DIAGNOSIS — I5022 Chronic systolic (congestive) heart failure: Secondary | ICD-10-CM

## 2023-11-21 ENCOUNTER — Ambulatory Visit: Attending: Internal Medicine | Admitting: *Deleted

## 2023-11-21 ENCOUNTER — Telehealth (HOSPITAL_COMMUNITY): Payer: Self-pay | Admitting: Internal Medicine

## 2023-11-21 DIAGNOSIS — Z5181 Encounter for therapeutic drug level monitoring: Secondary | ICD-10-CM

## 2023-11-21 DIAGNOSIS — Z952 Presence of prosthetic heart valve: Secondary | ICD-10-CM

## 2023-11-21 LAB — POCT INR: INR: 3.9 — AB (ref 2.0–3.0)

## 2023-11-21 MED ORDER — WARFARIN SODIUM 2 MG PO TABS: 2.0000 mg | ORAL_TABLET | Freq: Every day | ORAL | 1 refills | Status: DC

## 2023-11-21 NOTE — Telephone Encounter (Signed)
 Called to confirm/remind patient of their appointment at the [facility] on 11/21/2023.   Appointment:   [] Confirmed  [x] Left mess   [] No answer/No voice mail  [] VM Full/unable to leave message  [] Phone not in service  Patient reminded to bring all medications and/or complete list.  Confirmed patient has transportation. Gave directions, instructed to utilize valet parking.

## 2023-11-21 NOTE — Patient Instructions (Signed)
 NEW TABLET STRENGTH:  2mg  tablet Hold warfarin tonight then start 2mg  tablet daily ans soon as it arrives from pharmacy On Amiodarone  300mg  daily Recheck INR 2 wk

## 2023-11-21 NOTE — Progress Notes (Signed)
Please see anticoagulation encounter.

## 2023-11-22 ENCOUNTER — Ambulatory Visit (HOSPITAL_COMMUNITY)
Admission: RE | Admit: 2023-11-22 | Discharge: 2023-11-22 | Disposition: A | Discharge status: Home / Self Care | Source: Ambulatory Visit | Attending: Internal Medicine | Admitting: Internal Medicine

## 2023-11-22 ENCOUNTER — Encounter (HOSPITAL_COMMUNITY): Payer: Self-pay | Admitting: Internal Medicine

## 2023-11-22 VITALS — BP 120/60 | HR 69 | Ht 69.0 in | Wt 155.0 lb

## 2023-11-22 DIAGNOSIS — I5022 Chronic systolic (congestive) heart failure: Secondary | ICD-10-CM | POA: Diagnosis present

## 2023-11-22 DIAGNOSIS — L03119 Cellulitis of unspecified part of limb: Secondary | ICD-10-CM | POA: Diagnosis not present

## 2023-11-22 DIAGNOSIS — Z7982 Long term (current) use of aspirin: Secondary | ICD-10-CM | POA: Insufficient documentation

## 2023-11-22 DIAGNOSIS — Z951 Presence of aortocoronary bypass graft: Secondary | ICD-10-CM | POA: Diagnosis not present

## 2023-11-22 DIAGNOSIS — Z87891 Personal history of nicotine dependence: Secondary | ICD-10-CM | POA: Diagnosis not present

## 2023-11-22 DIAGNOSIS — Z8674 Personal history of sudden cardiac arrest: Secondary | ICD-10-CM | POA: Insufficient documentation

## 2023-11-22 DIAGNOSIS — Z79899 Other long term (current) drug therapy: Secondary | ICD-10-CM | POA: Insufficient documentation

## 2023-11-22 DIAGNOSIS — Z952 Presence of prosthetic heart valve: Secondary | ICD-10-CM | POA: Insufficient documentation

## 2023-11-22 DIAGNOSIS — Z7984 Long term (current) use of oral hypoglycemic drugs: Secondary | ICD-10-CM | POA: Diagnosis not present

## 2023-11-22 DIAGNOSIS — I252 Old myocardial infarction: Secondary | ICD-10-CM | POA: Diagnosis not present

## 2023-11-22 DIAGNOSIS — Z7901 Long term (current) use of anticoagulants: Secondary | ICD-10-CM | POA: Insufficient documentation

## 2023-11-22 DIAGNOSIS — Z8679 Personal history of other diseases of the circulatory system: Secondary | ICD-10-CM | POA: Insufficient documentation

## 2023-11-22 DIAGNOSIS — I739 Peripheral vascular disease, unspecified: Secondary | ICD-10-CM

## 2023-11-22 DIAGNOSIS — Z95810 Presence of automatic (implantable) cardiac defibrillator: Secondary | ICD-10-CM | POA: Diagnosis not present

## 2023-11-22 DIAGNOSIS — N189 Chronic kidney disease, unspecified: Secondary | ICD-10-CM | POA: Insufficient documentation

## 2023-11-22 DIAGNOSIS — I714 Abdominal aortic aneurysm, without rupture, unspecified: Secondary | ICD-10-CM | POA: Diagnosis not present

## 2023-11-22 DIAGNOSIS — I130 Hypertensive heart and chronic kidney disease with heart failure and stage 1 through stage 4 chronic kidney disease, or unspecified chronic kidney disease: Secondary | ICD-10-CM | POA: Insufficient documentation

## 2023-11-22 DIAGNOSIS — I2510 Atherosclerotic heart disease of native coronary artery without angina pectoris: Secondary | ICD-10-CM | POA: Insufficient documentation

## 2023-11-22 LAB — COMPREHENSIVE METABOLIC PANEL WITH GFR
ALT: 13 U/L (ref 0–44)
AST: 26 U/L (ref 15–41)
Albumin: 3.5 g/dL (ref 3.5–5.0)
Alkaline Phosphatase: 53 U/L (ref 38–126)
Anion gap: 12 (ref 5–15)
BUN: 48 mg/dL — ABNORMAL HIGH (ref 8–23)
CO2: 19 mmol/L — ABNORMAL LOW (ref 22–32)
Calcium: 9.2 mg/dL (ref 8.9–10.3)
Chloride: 104 mmol/L (ref 98–111)
Creatinine, Ser: 2.53 mg/dL — ABNORMAL HIGH (ref 0.61–1.24)
GFR, Estimated: 26 mL/min — ABNORMAL LOW
Glucose, Bld: 98 mg/dL (ref 70–99)
Potassium: 5 mmol/L (ref 3.5–5.1)
Sodium: 135 mmol/L (ref 135–145)
Total Bilirubin: 0.8 mg/dL (ref 0.0–1.2)
Total Protein: 7.6 g/dL (ref 6.5–8.1)

## 2023-11-22 LAB — BRAIN NATRIURETIC PEPTIDE: B Natriuretic Peptide: 1219.5 pg/mL — ABNORMAL HIGH (ref 0.0–100.0)

## 2023-11-22 NOTE — Progress Notes (Signed)
 Advanced Heart Failure Clinic Note    PCP: Joella Sieving, DO HF Cardiologist: Dr. Cherrie   HPI: Taylor [name] is a 73 y.o.. male with mechnical AVR on chronic coumadin , h/o VF arrest s/p ICD, CAD s/p CABG, chronic systolic HF (EF 20-25%), AAA and h/o essential thrombosis.    Has h/o congenital AoV abnormality with AI. In 1994 had MI and underwent PCI (unclear artery) and mechanical AVR at [facility].    Has h/o polysubstance abuse with cocaine, ETOH and tobacco abuse. No longer doing cocaine or smoking.    Did well from heart perspective until 2017, when he had VF arrest on 05/18/16. Found to have 3v CAD and MV disease and underwent CABG x 3 with re-do mechanical AVR. Post-operatively had AF (treated with amio) and CHB requiring implantation of a [employer]i CRT-D device. This is followed by Dr. Fernande. Coumadin  Clinic at [facility] follows INR. INR goal 2.0-3.0.    Echo on 12/18 showed LVEF of 25-30% stable AVR   Had CT on 12/18 for his back. Showed 3.7cm AAA. Had f/u US  05/2018 that showed mild increase in size of the known distal fusiform abdominal aortic aneurysm, measuring 4.2 cm in maximum diameter. He was referred to [facility] and he is now followed by Dr. Harvey.     Echo 10/20 showed EF 20-25%. RV normal. A  Admitted 1/21 w/ acute cholecystitis and sepsis. Hypotensive on admit w/ AKI and HF meds held. SCr bumped to 1.7 (baseline 1.1). Treated w/ IVFs and abx. Blood cultures negative. General surgery consulted and plans were for percutaneous cholecystostomy w/ drain, however HIDA scan was negative and he improved symptomatically w/ conservative therapy.   Echo 02/03/21: EF 25-30%, AVR looks good.   In AFL 2/23, EP unsuccessful with pace termination. Underwent DCCV to [facility] (4/23) but had ERAF about a week later.  Echo (5/23) showed EF 25-30%, grade II DD, RV ok, s/p AVR with mild AI  S/p CTI ablation 7/23 at [facility] with Dr. Leonce. Patient called [facility] 8/23 with 3 ICD therapies. He notified Dr.  Fernande who increased amio.  Saw EP 11/23, dizzy spells improved since fixing antifreeze leak in his car. Device interrogation showed recurrent MMVT and amio increased to 200 bid x 2 weeks then 200 daily.   In 6/24 EVAR for AAA requiring bilateral common femoral endarterectomies.  He had to go back on 11/10/2022 for a thrombosed left common femoral artery after endarterectomy. Then went to [facility] for left fem-pop. Course c/b LLE foot ulcer  Echo 5/25 EF 25-30%  Returns today for HF f/u with a friend. Foot wounds not healing well. Followed at [facility] and [facility]. Now being followed at [facility]. His friend is asking for a referral to Dr. Lonni Nevin at [facility] whom he says he researched and is expert in preventing amputations. Denies CP. Does had SOB with mild activity. Mild edema    Past Medical History:  Diagnosis Date   Abdominal aortic aneurysm (AAA) (HCC)    AICD (automatic cardioverter/defibrillator) present    Atherosclerotic heart disease of native coronary artery without angina pectoris    Chronic anticoagulation 02/18/2018   Chronic systolic heart failure (HCC) 02/02/2017   Essential thrombocythemia (HCC) 02/15/2017   Hyperlipidemia    Hypertension    Hypothyroidism    Ischemic cardiomyopathy    Leg pain, central, left 04/17/2017   Hx sciatica - pain similar; no red flag signs on hx or exam    Presence of other heart-valve replacement  Presence of permanent cardiac pacemaker    Thrombocytosis    pt states he takes a chemo pill for this   Ventricular tachycardia (HCC)    Current Outpatient Medications  Medication Sig Dispense Refill   amiodarone  (PACERONE ) 200 MG tablet TAKE 0.5 TABLET BY MOUTH EVERY MORNING AND 1 TABLET EVERY EVENING 135 tablet 3   amoxicillin  (AMOXIL ) 500 MG capsule Take 4 capsules (2.000mg ) one hour before dental procedures. 4 capsule 0   Ascorbic Acid ([employer]S VITAMIN C PO) Take 1 tablet by mouth daily.     aspirin  81 MG tablet Take  81 mg by mouth daily.     Cholecalciferol (VITAMIN D) 50 MCG (2000 UT) tablet Take 2,000 Units by mouth daily.     FARXIGA  10 MG TABS tablet TAKE 1 TABLET(10 MG) BY MOUTH DAILY 90 tablet 3   furosemide  (LASIX ) 20 MG tablet Take 2 tablets (40 mg total) by mouth daily. 60 tablet 11   hydroxyurea  (HYDREA ) 500 MG capsule Take 3 tablet (1500 mg )by mouth daily on Mon,Wed,Friday and 2 tabs (1000 mg) on Tues, Thursday, Sat and Sunday 180 capsule 4   levothyroxine  (SYNTHROID ) 75 MCG tablet Take 75 mcg by mouth daily before breakfast.     potassium chloride  (KLOR-CON  M) 10 MEQ tablet Take 4 tablets (40 mEq total) by mouth daily. 120 tablet 11   spironolactone  (ALDACTONE ) 25 MG tablet Take 1 tablet (25 mg total) by mouth every other day. 45 tablet 3   traZODone  (DESYREL ) 50 MG tablet Take 50 mg by mouth at bedtime.     vitamin B-12 (CYANOCOBALAMIN ) 1000 MCG tablet Take 1,000 mcg by mouth daily.     warfarin (COUMADIN ) 2 MG tablet Take 1 tablet (2 mg total) by mouth daily. 100 tablet 1   warfarin (COUMADIN ) 5 MG tablet TAKE 1/2 TABLET BY MOUTH DAILY OR AS DIRECTED BY COUMADIN  CLINIC. 50 tablet 1   carvedilol  (COREG ) 3.125 MG tablet Take 1 tablet (3.125 mg total) by mouth 2 (two) times daily with a meal. 180 tablet 3   No current facility-administered medications for this encounter.   Allergies  Allergen Reactions   Erythromycin Rash   Rosuvastatin Calcium      Muscle cramps   Simvastatin     Muscle cramps   Zithromax [Azithromycin] Rash   Social History   Socioeconomic History   Marital status: Divorced    Spouse name: Not on file   Number of children: 2   Years of education: Not on file   Highest education level: Not on file  Occupational History   Not on file  Tobacco Use   Smoking status: Former    Current packs/day: 0.00    Average packs/day: 1 pack/day for 37.0 years (37.0 ttl pk-yrs)    Types: Cigarettes    Start date: 05/26/1979    Quit date: 05/18/2016    Years since quitting:  7.5    Passive exposure: Never   Smokeless tobacco: Never  Vaping Use   Vaping status: Never Used  Substance and Sexual Activity   Alcohol use: Yes    Alcohol/week: 10.0 standard drinks of alcohol    Types: 10 Cans of beer per week    Comment: 2 beers a day max (sometimes just one a day)   Drug use: No   Sexual activity: Not on file  Other Topics Concern   Not on file  Social History Narrative   Not on file   Social Drivers of Corporate investment banker  Strain: Medium Risk (09/26/2023)   Received from [facility]   Overall Financial Resource Strain (CARDIA)    Difficulty of Paying Living Expenses: Somewhat hard  Food Insecurity: No Food Insecurity (09/26/2023)   Received from [facility]   Hunger Vital Sign    Within the past 12 months, you worried that your food would run out before you got the money to buy more.: Never true    Within the past 12 months, the food you bought just didn't last and you didn't have money to get more.: Never true  Transportation Needs: No Transportation Needs (09/26/2023)   Received from [facility] - Transportation    In the past 12 months, has lack of transportation kept you from medical appointments or from getting medications?: No    Lack of Transportation (Non-Medical): No  Physical Activity: Not on file  Stress: Not on file  Social Connections: Not on file  Intimate Partner Violence: Not At Risk (04/03/2022)   Humiliation, Afraid, Rape, and Kick questionnaire    Fear of Current or Ex-Partner: No    Emotionally Abused: No    Physically Abused: No    Sexually Abused: No   Family History  Problem Relation Age of Onset   Hypertension Father    BP 120/60   Pulse 69   Ht 5' 9 (1.753 m)   Wt 70.3 kg (155 lb)   SpO2 96%   BMI 22.89 kg/m   Wt Readings from Last 3 Encounters:  11/22/23 70.3 kg (155 lb)  08/30/23 71.9 kg (158 lb 9.6 oz)  08/29/23 72.9 kg (160 lb 11.2 oz)    PHYSICAL EXAM: General:  Thin male Chronically ill appearing No resp difficulty HEENT: normal Neck: supple. JVP 6-7Carotids 2+ bilat; no bruits. No lymphadenopathy or thryomegaly appreciated. Cor: PMI nondisplaced. Regular rate & rhythm. Mech s2 Lungs: clear Abdomen: soft, nontender, nondistended. No hepatosplenomegaly. No bruits or masses. Good bowel sounds. Extremities: no cyanosis, clubbing, rash, tr-1+ edema  Feet wrapped Neuro: alert & orientedx3, cranial nerves grossly intact. moves all 4 extremities w/o difficulty. Affect pleasant  Device interrogation (personally reviewed):No AF/VT Personally reviewed   ASSESSMENT & PLAN:  1. Chronic Systolic Heart Failure - Echo (05/2016 @ [facility]): EF 40% - Echo (12/18) EF 25-30% - Echo (12/20): EF 20-25%. Diffuse hypokinesis, No LVH. RV normal. Mild AI, trace MR - s/p CRT-D.  - Echo 5/23 EF 25-30% - Echo 6/24 EF 25-30%  - Echo 5/25 EF 25-30% - NYHA III-IV mainly iimited currently by LE wound - Volume remains mildly elevated but improving with recent lasix  increase - Continue Farxiga  10 mg daily. - Continue Coreg  3.125 mg bid.  - Continue spironolactone  25 mg every other day  - Off losartan  with CKD and low BP - Has failed eval for Barostim and CCM - Currently not candidate for advanced therapies  2. CAD - h/o remote PCI in 1994 at [facility] - s/p LHC in 2017 in the setting of Vfib Arrest w/ 3V CAD treated w/ CABG x 3 - No s/s angina - Continue ASA, statin and ? blocker. - Goal LDL < 70   3. Mechanical Aortic Valve - Stable on echo - On coumadin . INR followed by coumadin  clinic - No bleeding - Aware of SBE prophylaxis  5. H/o VF Arrest and H/o CHB - s/p BoSci CRT-D device, followed by EP (Dr. Fernande) - Continue amiodarone  100 q am/200 q pm, per EP. - No VT  on device interrogation today  6. PAD with AAA with nonhealing LE wounds - 6/24 EVAR for AAA requiring bilateral common femoral endarterectomies.  He had to go back on  11/10/2022 for a thrombosed left common femoral artery after endarterectomy. Then went to [facility] for left fem-pop. Course c/b LLE foot ulcer - Continue statin + ASA - Continues to struggle with nonhealing wounds. Has been followed at [facility] and here. - Asking for referral to [facility] due to concern for potential need for amputation and effort for limb salvage - Referral made  Toribio Fuel, MD 11/22/23

## 2023-11-22 NOTE — Patient Instructions (Signed)
 Medication Changes:  No Changes In Medications at this time.   Lab Work:  Labs done today, your results will be available in MyChart, we will contact you for abnormal readings.  Referrals:  YOU HAVE BEEN REFERRED TO DR. LONNI ABUBARAGE THEY WILL REACH OUT TO YOU OR CALL TO ARRANGE THIS. PLEASE CALL US  WITH ANY CONCERNS   Follow-Up in: 6 MONTHS PLEASE CALL OUR OFFICE AROUND NOVEMBER TO GET SCHEDULED FOR YOUR APPOINTMENT. PHONE NUMBER IS [phone] OPTION 2   At the Advanced Heart Failure Clinic, you and your health needs are our priority. We have a designated team specialized in the treatment of Heart Failure. This Care Team includes your primary Heart Failure Specialized Cardiologist (physician), Advanced Practice Providers (APPs- Physician Assistants and Nurse Practitioners), and Pharmacist who all work together to provide you with the care you need, when you need it.   You may see any of the following providers on your designated Care Team at your next follow up:  Dr. Toribio Fuel Dr. Ezra Shuck Dr. Ria Commander Dr. Odis Brownie Greig Mosses, NP Caffie Shed, [place] [facility], [place] Beckey Coe, NP [place] Lee, NP Tinnie Redman, PharmD   Please be sure to bring in all your medications bottles to every appointment.   Need to Contact Us :  If you have any questions or concerns before your next appointment please send us  a message through [place] or call our office at [phone].    TO LEAVE A MESSAGE FOR THE NURSE SELECT OPTION 2, PLEASE LEAVE A MESSAGE INCLUDING: YOUR NAME DATE OF BIRTH CALL BACK NUMBER REASON FOR CALL**this is important as we prioritize the call backs  YOU WILL RECEIVE A CALL BACK THE SAME DAY AS LONG AS YOU CALL BEFORE 4:00 PM

## 2023-11-27 ENCOUNTER — Encounter (HOSPITAL_BASED_OUTPATIENT_CLINIC_OR_DEPARTMENT_OTHER): Attending: Internal Medicine | Admitting: Internal Medicine

## 2023-11-27 DIAGNOSIS — L97528 Non-pressure chronic ulcer of other part of left foot with other specified severity: Secondary | ICD-10-CM | POA: Insufficient documentation

## 2023-11-27 DIAGNOSIS — I5022 Chronic systolic (congestive) heart failure: Secondary | ICD-10-CM | POA: Diagnosis not present

## 2023-11-27 DIAGNOSIS — I70243 Atherosclerosis of native arteries of left leg with ulceration of ankle: Secondary | ICD-10-CM | POA: Diagnosis present

## 2023-11-27 DIAGNOSIS — Z95810 Presence of automatic (implantable) cardiac defibrillator: Secondary | ICD-10-CM | POA: Diagnosis not present

## 2023-11-27 DIAGNOSIS — I70235 Atherosclerosis of native arteries of right leg with ulceration of other part of foot: Secondary | ICD-10-CM | POA: Diagnosis not present

## 2023-11-27 DIAGNOSIS — I70245 Atherosclerosis of native arteries of left leg with ulceration of other part of foot: Secondary | ICD-10-CM | POA: Insufficient documentation

## 2023-11-27 DIAGNOSIS — L97518 Non-pressure chronic ulcer of other part of right foot with other specified severity: Secondary | ICD-10-CM | POA: Diagnosis not present

## 2023-11-29 ENCOUNTER — Inpatient Hospital Stay

## 2023-12-03 ENCOUNTER — Telehealth (HOSPITAL_COMMUNITY): Payer: Self-pay

## 2023-12-04 NOTE — Telephone Encounter (Signed)
 Patient called wanting to know if he could be transferred to [facility]. He said he heard from [facility] but the appointment is scheduled too far out. Please advise.

## 2023-12-05 ENCOUNTER — Telehealth (HOSPITAL_COMMUNITY): Payer: Self-pay | Admitting: Cardiology

## 2023-12-05 ENCOUNTER — Ambulatory Visit: Attending: Internal Medicine | Admitting: *Deleted

## 2023-12-05 DIAGNOSIS — Z5181 Encounter for therapeutic drug level monitoring: Secondary | ICD-10-CM | POA: Diagnosis not present

## 2023-12-05 DIAGNOSIS — Z952 Presence of prosthetic heart valve: Secondary | ICD-10-CM

## 2023-12-05 LAB — POCT INR: INR: 4.3 — AB (ref 2.0–3.0)

## 2023-12-05 NOTE — Patient Instructions (Signed)
 NEW TABLET STRENGTH:  2mg  tablet Hold warfarin tonight then decrease dose to 1 tablet daily except 1/2 tablet on Mondays and Thursdays On Amiodarone  300mg  daily Recheck INR 2 wk

## 2023-12-05 NOTE — Telephone Encounter (Signed)
 Patient left VM on triage Reports Taylor [name] needs Name and DOB to proceed with referral   Per Taylor [name] rep [phone]) a single case agreement is needed from insurance in order process referral.  A new auth/case agreement will be needed for every visit that follows  Please fax auth to [phone] Representative states process can take 6-8 weeks

## 2023-12-05 NOTE — Progress Notes (Signed)
Please see anticoagulation encounter.

## 2023-12-07 ENCOUNTER — Inpatient Hospital Stay: Admitting: Oncology

## 2023-12-11 ENCOUNTER — Encounter (HOSPITAL_BASED_OUTPATIENT_CLINIC_OR_DEPARTMENT_OTHER): Admitting: Internal Medicine

## 2023-12-18 NOTE — Progress Notes (Signed)
 Remote ICD transmission.

## 2023-12-19 ENCOUNTER — Ambulatory Visit: Attending: Internal Medicine | Admitting: *Deleted

## 2023-12-19 DIAGNOSIS — Z5181 Encounter for therapeutic drug level monitoring: Secondary | ICD-10-CM | POA: Diagnosis not present

## 2023-12-19 DIAGNOSIS — Z952 Presence of prosthetic heart valve: Secondary | ICD-10-CM

## 2023-12-19 LAB — POCT INR: INR: 3.7 — AB (ref 2.0–3.0)

## 2023-12-19 NOTE — Patient Instructions (Signed)
 NEW TABLET STRENGTH:  2mg  tablet Hold warfarin tonight then decrease dose to 1/2 tablet daily except 1 tablet on Mondays, Wednesdays and Fridays On Amiodarone  300mg  daily Recheck INR 2 wk

## 2023-12-19 NOTE — Progress Notes (Signed)
 Please see anticoagulation encounter 3.7

## 2023-12-20 NOTE — Telephone Encounter (Signed)
 Patient called in stating that he needs chantel to call [phone] to speak with Koren Bihari regarding his referral.

## 2023-12-20 NOTE — Telephone Encounter (Signed)
 Spoke with Koren LABOR peer to peer single case agreement is needed [phone]

## 2024-01-01 ENCOUNTER — Telehealth: Payer: Self-pay

## 2024-01-01 ENCOUNTER — Encounter (HOSPITAL_COMMUNITY): Payer: Self-pay

## 2024-01-01 NOTE — Telephone Encounter (Signed)
 See note from Dr. Inocencio.

## 2024-01-01 NOTE — Telephone Encounter (Signed)
 Spoke to pts sister Audria) per DPR, states she has not spoken to her brother (patient) today.   Informed her that patient was shocked by his ICD twice yesterday evening. [name] advised she is not able to physically check on pt as her back is giving her problems. States she will call/have another family member go check on patient and see if he will go to ER. [name] advised patient normally will not go to local ER, he wants to go to [facility] but no one is available to take him.   States she will attempt to contact patient and call device clinic back. Direct number provided.

## 2024-01-01 NOTE — Telephone Encounter (Signed)
 Alert received from [employer] for Ventricular shock therapy delivered to convert arrhythmia. Events occurred 8/4 @ 20:09, and 8/5 @ 00:34, HR's 183-228, V>A 1st event ATP x3 followed by 41J of HV therapy, 2nd event ATP x2 followed by 41J of HV therapy.  Attempted all numbers on file to contact patient with no success. Left messages on all numbers to call device clinic back with direct dial provided. Will send mychart message.

## 2024-01-01 NOTE — Telephone Encounter (Signed)
 Spoke to patients sister as follow up who reports she went to check on patient who was very pale and weak. States she call EMS and patient just arrived at [place] [place] ER.   Dr. Margretta name provided since [place] hospital is not able to access our system.   Was appreciative of call. Will call at later time to check on status of patient.

## 2024-01-02 ENCOUNTER — Inpatient Hospital Stay

## 2024-01-02 ENCOUNTER — Other Ambulatory Visit (HOSPITAL_COMMUNITY): Payer: Self-pay

## 2024-01-02 ENCOUNTER — Other Ambulatory Visit: Payer: Self-pay

## 2024-01-02 ENCOUNTER — Telehealth (HOSPITAL_COMMUNITY): Payer: Self-pay | Admitting: Pharmacy Technician

## 2024-01-02 ENCOUNTER — Inpatient Hospital Stay (HOSPITAL_COMMUNITY)

## 2024-01-02 ENCOUNTER — Inpatient Hospital Stay (HOSPITAL_COMMUNITY)
Admission: EM | Admit: 2024-01-02 | Discharge: 2024-01-11 | DRG: 291 | Disposition: A | Discharge status: Hospice | Source: Other Acute Inpatient Hospital | Attending: Internal Medicine | Admitting: Internal Medicine

## 2024-01-02 DIAGNOSIS — Z7982 Long term (current) use of aspirin: Secondary | ICD-10-CM

## 2024-01-02 DIAGNOSIS — I959 Hypotension, unspecified: Secondary | ICD-10-CM | POA: Diagnosis not present

## 2024-01-02 DIAGNOSIS — F32A Depression, unspecified: Secondary | ICD-10-CM | POA: Diagnosis present

## 2024-01-02 DIAGNOSIS — E039 Hypothyroidism, unspecified: Secondary | ICD-10-CM | POA: Diagnosis present

## 2024-01-02 DIAGNOSIS — I70244 Atherosclerosis of native arteries of left leg with ulceration of heel and midfoot: Secondary | ICD-10-CM | POA: Diagnosis not present

## 2024-01-02 DIAGNOSIS — I2510 Atherosclerotic heart disease of native coronary artery without angina pectoris: Secondary | ICD-10-CM | POA: Diagnosis not present

## 2024-01-02 DIAGNOSIS — R5382 Chronic fatigue, unspecified: Secondary | ICD-10-CM | POA: Diagnosis present

## 2024-01-02 DIAGNOSIS — I714 Abdominal aortic aneurysm, without rupture, unspecified: Secondary | ICD-10-CM | POA: Diagnosis present

## 2024-01-02 DIAGNOSIS — I472 Ventricular tachycardia, unspecified: Principal | ICD-10-CM | POA: Diagnosis present

## 2024-01-02 DIAGNOSIS — I130 Hypertensive heart and chronic kidney disease with heart failure and stage 1 through stage 4 chronic kidney disease, or unspecified chronic kidney disease: Secondary | ICD-10-CM | POA: Diagnosis present

## 2024-01-02 DIAGNOSIS — Z87891 Personal history of nicotine dependence: Secondary | ICD-10-CM

## 2024-01-02 DIAGNOSIS — R54 Age-related physical debility: Secondary | ICD-10-CM | POA: Diagnosis present

## 2024-01-02 DIAGNOSIS — I5043 Acute on chronic combined systolic (congestive) and diastolic (congestive) heart failure: Secondary | ICD-10-CM | POA: Diagnosis present

## 2024-01-02 DIAGNOSIS — R1310 Dysphagia, unspecified: Secondary | ICD-10-CM | POA: Diagnosis present

## 2024-01-02 DIAGNOSIS — Z515 Encounter for palliative care: Secondary | ICD-10-CM | POA: Diagnosis not present

## 2024-01-02 DIAGNOSIS — D473 Essential (hemorrhagic) thrombocythemia: Secondary | ICD-10-CM | POA: Diagnosis present

## 2024-01-02 DIAGNOSIS — R627 Adult failure to thrive: Secondary | ICD-10-CM | POA: Diagnosis present

## 2024-01-02 DIAGNOSIS — E43 Unspecified severe protein-calorie malnutrition: Secondary | ICD-10-CM | POA: Diagnosis present

## 2024-01-02 DIAGNOSIS — Z79890 Hormone replacement therapy (postmenopausal): Secondary | ICD-10-CM

## 2024-01-02 DIAGNOSIS — Z8249 Family history of ischemic heart disease and other diseases of the circulatory system: Secondary | ICD-10-CM

## 2024-01-02 DIAGNOSIS — I739 Peripheral vascular disease, unspecified: Secondary | ICD-10-CM

## 2024-01-02 DIAGNOSIS — Z8673 Personal history of transient ischemic attack (TIA), and cerebral infarction without residual deficits: Secondary | ICD-10-CM

## 2024-01-02 DIAGNOSIS — Z6822 Body mass index (BMI) 22.0-22.9, adult: Secondary | ICD-10-CM

## 2024-01-02 DIAGNOSIS — I70235 Atherosclerosis of native arteries of right leg with ulceration of other part of foot: Secondary | ICD-10-CM | POA: Diagnosis not present

## 2024-01-02 DIAGNOSIS — F17210 Nicotine dependence, cigarettes, uncomplicated: Secondary | ICD-10-CM | POA: Diagnosis not present

## 2024-01-02 DIAGNOSIS — I70234 Atherosclerosis of native arteries of right leg with ulceration of heel and midfoot: Secondary | ICD-10-CM | POA: Diagnosis not present

## 2024-01-02 DIAGNOSIS — Z7401 Bed confinement status: Secondary | ICD-10-CM

## 2024-01-02 DIAGNOSIS — Z66 Do not resuscitate: Secondary | ICD-10-CM | POA: Diagnosis present

## 2024-01-02 DIAGNOSIS — Z7901 Long term (current) use of anticoagulants: Secondary | ICD-10-CM

## 2024-01-02 DIAGNOSIS — I493 Ventricular premature depolarization: Secondary | ICD-10-CM | POA: Diagnosis present

## 2024-01-02 DIAGNOSIS — I255 Ischemic cardiomyopathy: Secondary | ICD-10-CM | POA: Diagnosis present

## 2024-01-02 DIAGNOSIS — I462 Cardiac arrest due to underlying cardiac condition: Secondary | ICD-10-CM | POA: Diagnosis present

## 2024-01-02 DIAGNOSIS — I5023 Acute on chronic systolic (congestive) heart failure: Secondary | ICD-10-CM | POA: Diagnosis not present

## 2024-01-02 DIAGNOSIS — R531 Weakness: Secondary | ICD-10-CM | POA: Diagnosis not present

## 2024-01-02 DIAGNOSIS — L97519 Non-pressure chronic ulcer of other part of right foot with unspecified severity: Secondary | ICD-10-CM | POA: Diagnosis not present

## 2024-01-02 DIAGNOSIS — Z5941 Food insecurity: Secondary | ICD-10-CM

## 2024-01-02 DIAGNOSIS — R791 Abnormal coagulation profile: Secondary | ICD-10-CM | POA: Diagnosis not present

## 2024-01-02 DIAGNOSIS — L97419 Non-pressure chronic ulcer of right heel and midfoot with unspecified severity: Secondary | ICD-10-CM | POA: Diagnosis present

## 2024-01-02 DIAGNOSIS — I480 Paroxysmal atrial fibrillation: Secondary | ICD-10-CM | POA: Diagnosis present

## 2024-01-02 DIAGNOSIS — Z952 Presence of prosthetic heart valve: Secondary | ICD-10-CM

## 2024-01-02 DIAGNOSIS — F1010 Alcohol abuse, uncomplicated: Secondary | ICD-10-CM | POA: Diagnosis present

## 2024-01-02 DIAGNOSIS — Z5986 Financial insecurity: Secondary | ICD-10-CM

## 2024-01-02 DIAGNOSIS — I252 Old myocardial infarction: Secondary | ICD-10-CM

## 2024-01-02 DIAGNOSIS — D509 Iron deficiency anemia, unspecified: Secondary | ICD-10-CM | POA: Diagnosis present

## 2024-01-02 DIAGNOSIS — R936 Abnormal findings on diagnostic imaging of limbs: Secondary | ICD-10-CM | POA: Diagnosis not present

## 2024-01-02 DIAGNOSIS — I70223 Atherosclerosis of native arteries of extremities with rest pain, bilateral legs: Secondary | ICD-10-CM | POA: Diagnosis present

## 2024-01-02 DIAGNOSIS — Z743 Need for continuous supervision: Secondary | ICD-10-CM | POA: Diagnosis not present

## 2024-01-02 DIAGNOSIS — R161 Splenomegaly, not elsewhere classified: Secondary | ICD-10-CM | POA: Diagnosis present

## 2024-01-02 DIAGNOSIS — R7989 Other specified abnormal findings of blood chemistry: Secondary | ICD-10-CM | POA: Diagnosis not present

## 2024-01-02 DIAGNOSIS — Z9889 Other specified postprocedural states: Secondary | ICD-10-CM | POA: Diagnosis not present

## 2024-01-02 DIAGNOSIS — L97429 Non-pressure chronic ulcer of left heel and midfoot with unspecified severity: Secondary | ICD-10-CM | POA: Diagnosis present

## 2024-01-02 DIAGNOSIS — N189 Chronic kidney disease, unspecified: Secondary | ICD-10-CM | POA: Diagnosis not present

## 2024-01-02 DIAGNOSIS — Z888 Allergy status to other drugs, medicaments and biological substances status: Secondary | ICD-10-CM

## 2024-01-02 DIAGNOSIS — N1832 Chronic kidney disease, stage 3b: Secondary | ICD-10-CM | POA: Diagnosis present

## 2024-01-02 DIAGNOSIS — Z881 Allergy status to other antibiotic agents status: Secondary | ICD-10-CM

## 2024-01-02 DIAGNOSIS — I5084 End stage heart failure: Secondary | ICD-10-CM | POA: Diagnosis present

## 2024-01-02 DIAGNOSIS — N179 Acute kidney failure, unspecified: Secondary | ICD-10-CM | POA: Diagnosis present

## 2024-01-02 DIAGNOSIS — Z9049 Acquired absence of other specified parts of digestive tract: Secondary | ICD-10-CM

## 2024-01-02 DIAGNOSIS — Z79899 Other long term (current) drug therapy: Secondary | ICD-10-CM

## 2024-01-02 DIAGNOSIS — D539 Nutritional anemia, unspecified: Secondary | ICD-10-CM | POA: Diagnosis present

## 2024-01-02 DIAGNOSIS — R296 Repeated falls: Secondary | ICD-10-CM | POA: Diagnosis present

## 2024-01-02 DIAGNOSIS — Z7189 Other specified counseling: Secondary | ICD-10-CM | POA: Diagnosis not present

## 2024-01-02 DIAGNOSIS — E785 Hyperlipidemia, unspecified: Secondary | ICD-10-CM | POA: Diagnosis present

## 2024-01-02 DIAGNOSIS — Z95810 Presence of automatic (implantable) cardiac defibrillator: Secondary | ICD-10-CM | POA: Diagnosis not present

## 2024-01-02 DIAGNOSIS — I70293 Other atherosclerosis of native arteries of extremities, bilateral legs: Secondary | ICD-10-CM | POA: Diagnosis not present

## 2024-01-02 DIAGNOSIS — Z91148 Patient's other noncompliance with medication regimen for other reason: Secondary | ICD-10-CM

## 2024-01-02 DIAGNOSIS — Z95828 Presence of other vascular implants and grafts: Secondary | ICD-10-CM | POA: Diagnosis not present

## 2024-01-02 DIAGNOSIS — I25810 Atherosclerosis of coronary artery bypass graft(s) without angina pectoris: Secondary | ICD-10-CM | POA: Diagnosis not present

## 2024-01-02 DIAGNOSIS — Z951 Presence of aortocoronary bypass graft: Secondary | ICD-10-CM

## 2024-01-02 DIAGNOSIS — I70229 Atherosclerosis of native arteries of extremities with rest pain, unspecified extremity: Secondary | ICD-10-CM | POA: Diagnosis not present

## 2024-01-02 DIAGNOSIS — L97529 Non-pressure chronic ulcer of other part of left foot with unspecified severity: Secondary | ICD-10-CM | POA: Diagnosis not present

## 2024-01-02 DIAGNOSIS — I4901 Ventricular fibrillation: Secondary | ICD-10-CM | POA: Diagnosis present

## 2024-01-02 DIAGNOSIS — E876 Hypokalemia: Secondary | ICD-10-CM | POA: Diagnosis present

## 2024-01-02 LAB — CBC
HCT: 27.5 % — ABNORMAL LOW (ref 39.0–52.0)
Hemoglobin: 9 g/dL — ABNORMAL LOW (ref 13.0–17.0)
MCH: 37 pg — ABNORMAL HIGH (ref 26.0–34.0)
MCHC: 32.7 g/dL (ref 30.0–36.0)
MCV: 113.2 fL — ABNORMAL HIGH (ref 80.0–100.0)
Platelets: 435 K/uL — ABNORMAL HIGH (ref 150–400)
RBC: 2.43 MIL/uL — ABNORMAL LOW (ref 4.22–5.81)
RDW: 31.7 % — ABNORMAL HIGH (ref 11.5–15.5)
WBC: 4.3 K/uL (ref 4.0–10.5)
nRBC: 0.5 % — ABNORMAL HIGH (ref 0.0–0.2)

## 2024-01-02 LAB — PROTIME-INR
INR: 2.7 — ABNORMAL HIGH (ref 0.8–1.2)
Prothrombin Time: 30.2 s — ABNORMAL HIGH (ref 11.4–15.2)

## 2024-01-02 LAB — MAGNESIUM: Magnesium: 1.9 mg/dL (ref 1.7–2.4)

## 2024-01-02 LAB — COMPREHENSIVE METABOLIC PANEL WITH GFR
ALT: 88 U/L — ABNORMAL HIGH (ref 0–44)
AST: 56 U/L — ABNORMAL HIGH (ref 15–41)
Albumin: 2.5 g/dL — ABNORMAL LOW (ref 3.5–5.0)
Alkaline Phosphatase: 70 U/L (ref 38–126)
Anion gap: 12 (ref 5–15)
BUN: 41 mg/dL — ABNORMAL HIGH (ref 8–23)
CO2: 21 mmol/L — ABNORMAL LOW (ref 22–32)
Calcium: 9 mg/dL (ref 8.9–10.3)
Chloride: 100 mmol/L (ref 98–111)
Creatinine, Ser: 1.51 mg/dL — ABNORMAL HIGH (ref 0.61–1.24)
GFR, Estimated: 48 mL/min — ABNORMAL LOW (ref >60)
Glucose, Bld: 127 mg/dL — ABNORMAL HIGH (ref 70–99)
Potassium: 2.7 mmol/L — CL (ref 3.5–5.1)
Sodium: 133 mmol/L — ABNORMAL LOW (ref 135–145)
Total Bilirubin: 1.3 mg/dL — ABNORMAL HIGH (ref 0.0–1.2)
Total Protein: 6.8 g/dL (ref 6.5–8.1)

## 2024-01-02 LAB — COOXEMETRY PANEL
Carboxyhemoglobin: 2.1 % — ABNORMAL HIGH (ref 0.5–1.5)
Methemoglobin: <0.7 % (ref 0.0–1.5)
O2 Saturation: 86.4 %
Total hemoglobin: 9.4 g/dL — ABNORMAL LOW (ref 12.0–16.0)

## 2024-01-02 LAB — TSH: TSH: 17.167 u[IU]/mL — ABNORMAL HIGH (ref 0.350–4.500)

## 2024-01-02 LAB — IRON AND TIBC
Iron: 38 ug/dL — ABNORMAL LOW (ref 45–182)
Saturation Ratios: 12 % — ABNORMAL LOW (ref 17.9–39.5)
TIBC: 318 ug/dL (ref 250–450)
UIBC: 280 ug/dL

## 2024-01-02 LAB — TYPE AND SCREEN
ABO/RH(D): A POS
Antibody Screen: NEGATIVE

## 2024-01-02 LAB — T4, FREE: Free T4: 0.81 ng/dL (ref 0.61–1.12)

## 2024-01-02 LAB — RETICULOCYTES
Immature Retic Fract: 26.7 % — ABNORMAL HIGH (ref 2.3–15.9)
RBC.: 2.43 MIL/uL — ABNORMAL LOW (ref 4.22–5.81)
Retic Count, Absolute: 50.8 K/uL (ref 19.0–186.0)
Retic Ct Pct: 2.1 % (ref 0.4–3.1)

## 2024-01-02 LAB — LACTIC ACID, PLASMA: Lactic Acid, Venous: 1.8 mmol/L (ref 0.5–1.9)

## 2024-01-02 LAB — VITAMIN B12: Vitamin B-12: 2300 pg/mL — ABNORMAL HIGH (ref 180–914)

## 2024-01-02 LAB — ETHANOL: Alcohol, Ethyl (B): <15 mg/dL (ref <15)

## 2024-01-02 LAB — TROPONIN I (HIGH SENSITIVITY): Troponin I (High Sensitivity): 78 ng/L — ABNORMAL HIGH (ref <18)

## 2024-01-02 LAB — FOLATE: Folate: 11.5 ng/mL (ref >5.9)

## 2024-01-02 LAB — FERRITIN: Ferritin: 79 ng/mL (ref 24–336)

## 2024-01-02 MED ORDER — TRAMADOL HCL 50 MG PO TABS
50.0000 mg | ORAL_TABLET | Freq: Two times a day (BID) | ORAL | Status: DC | PRN
  Administered 2024-01-03 – 2024-01-09 (×16): 50 mg via ORAL
  Filled 2024-01-02 (×14): qty 1

## 2024-01-02 MED ORDER — SPIRONOLACTONE 25 MG PO TABS: 25.0000 mg | ORAL_TABLET | ORAL | Status: DC

## 2024-01-02 MED ORDER — PANTOPRAZOLE SODIUM 40 MG IV SOLR
40.0000 mg | Freq: Two times a day (BID) | INTRAVENOUS | Status: DC
  Administered 2024-01-02 – 2024-01-04 (×4): 40 mg via INTRAVENOUS
  Filled 2024-01-02 (×4): qty 10

## 2024-01-02 MED ORDER — ONDANSETRON HCL 4 MG/2ML IJ SOLN
4.0000 mg | Freq: Four times a day (QID) | INTRAMUSCULAR | Status: DC | PRN
  Administered 2024-01-03 – 2024-01-06 (×2): 4 mg via INTRAVENOUS
  Filled 2024-01-02 (×2): qty 2

## 2024-01-02 MED ORDER — AMIODARONE HCL IN DEXTROSE 360-4.14 MG/200ML-% IV SOLN: 60.0000 mg/h | INTRAVENOUS | Status: DC

## 2024-01-02 MED ORDER — SODIUM CHLORIDE 0.9% FLUSH
10.0000 mL | Freq: Two times a day (BID) | INTRAVENOUS | Status: DC
  Administered 2024-01-02 – 2024-01-10 (×22): 10 mL
  Administered 2024-01-11: 20 mL

## 2024-01-02 MED ORDER — SPIRONOLACTONE 25 MG PO TABS
25.0000 mg | ORAL_TABLET | Freq: Every day | ORAL | Status: DC
  Administered 2024-01-02 – 2024-01-11 (×13): 25 mg via ORAL
  Filled 2024-01-02 (×10): qty 1

## 2024-01-02 MED ORDER — SODIUM CHLORIDE 0.9% FLUSH: 10.0000 mL | INTRAVENOUS | Status: DC | PRN

## 2024-01-02 MED ORDER — AMIODARONE HCL IN DEXTROSE 360-4.14 MG/200ML-% IV SOLN: 30.0000 mg/h | INTRAVENOUS | Status: DC

## 2024-01-02 MED ORDER — ASPIRIN 81 MG PO TBEC
81.0000 mg | DELAYED_RELEASE_TABLET | Freq: Every day | ORAL | Status: DC
  Administered 2024-01-03 – 2024-01-11 (×12): 81 mg via ORAL
  Filled 2024-01-02 (×9): qty 1

## 2024-01-02 MED ORDER — POTASSIUM CHLORIDE CRYS ER 20 MEQ PO TBCR
40.0000 meq | EXTENDED_RELEASE_TABLET | Freq: Once | ORAL | Status: AC
  Administered 2024-01-02: 40 meq via ORAL
  Filled 2024-01-02: qty 2

## 2024-01-02 MED ORDER — FUROSEMIDE 10 MG/ML IJ SOLN
80.0000 mg | Freq: Two times a day (BID) | INTRAMUSCULAR | Status: DC
  Administered 2024-01-02 – 2024-01-06 (×9): 80 mg via INTRAVENOUS
  Filled 2024-01-02 (×10): qty 8

## 2024-01-02 MED ORDER — POTASSIUM CHLORIDE CRYS ER 20 MEQ PO TBCR
40.0000 meq | EXTENDED_RELEASE_TABLET | Freq: Every day | ORAL | Status: DC
  Administered 2024-01-03 – 2024-01-11 (×12): 40 meq via ORAL
  Filled 2024-01-02 (×9): qty 2

## 2024-01-02 MED ORDER — CHLORHEXIDINE GLUCONATE CLOTH 2 % EX PADS
6.0000 | MEDICATED_PAD | Freq: Every day | CUTANEOUS | Status: DC
  Administered 2024-01-03 – 2024-01-10 (×12): 6 via TOPICAL

## 2024-01-02 MED ORDER — MEXILETINE HCL 200 MG PO CAPS
200.0000 mg | ORAL_CAPSULE | Freq: Two times a day (BID) | ORAL | Status: DC
Start: 2024-01-02 — End: 2024-01-11
  Administered 2024-01-02 – 2024-01-11 (×25): 200 mg via ORAL
  Filled 2024-01-02 (×20): qty 1

## 2024-01-02 MED ORDER — DAPAGLIFLOZIN PROPANEDIOL 10 MG PO TABS
10.0000 mg | ORAL_TABLET | Freq: Every day | ORAL | Status: DC
  Administered 2024-01-03 – 2024-01-06 (×4): 10 mg via ORAL
  Filled 2024-01-02 (×4): qty 1

## 2024-01-02 MED ORDER — ACETAMINOPHEN 325 MG PO TABS
650.0000 mg | ORAL_TABLET | ORAL | Status: DC | PRN
  Administered 2024-01-02 – 2024-01-06 (×8): 650 mg via ORAL
  Filled 2024-01-02 (×8): qty 2

## 2024-01-02 MED ORDER — HYDROXYUREA 500 MG PO CAPS
1000.0000 mg | ORAL_CAPSULE | ORAL | Status: DC
  Administered 2024-01-03: 1000 mg via ORAL
  Filled 2024-01-02: qty 2

## 2024-01-02 MED ORDER — GERHARDT'S BUTT CREAM
TOPICAL_CREAM | Freq: Two times a day (BID) | CUTANEOUS | Status: DC
  Administered 2024-01-02 – 2024-01-07 (×4): 1 via TOPICAL
  Filled 2024-01-02 (×2): qty 60

## 2024-01-02 MED ORDER — VITAMIN D 50 MCG (2000 UT) PO TABS: 2000.0000 [IU] | ORAL_TABLET | Freq: Every day | ORAL | Status: DC

## 2024-01-02 MED ORDER — FUROSEMIDE 10 MG/ML IJ SOLN
80.0000 mg | Freq: Once | INTRAMUSCULAR | Status: AC
  Administered 2024-01-02: 80 mg via INTRAVENOUS
  Filled 2024-01-02: qty 8

## 2024-01-02 MED ORDER — HYDROXYUREA 500 MG PO CAPS: 1000.0000 mg | ORAL_CAPSULE | Freq: Every day | ORAL | Status: DC

## 2024-01-02 MED ORDER — VITAMIN B-12 1000 MCG PO TABS
1000.0000 ug | ORAL_TABLET | Freq: Every day | ORAL | Status: DC
  Administered 2024-01-03: 1000 ug via ORAL
  Filled 2024-01-02: qty 1

## 2024-01-02 MED ORDER — AMIODARONE HCL IN DEXTROSE 360-4.14 MG/200ML-% IV SOLN
60.0000 mg/h | INTRAVENOUS | Status: AC
  Administered 2024-01-02: 60 mg/h via INTRAVENOUS
  Filled 2024-01-02: qty 200

## 2024-01-02 MED ORDER — SODIUM CHLORIDE 0.9% FLUSH: 3.0000 mL | INTRAVENOUS | Status: DC | PRN

## 2024-01-02 MED ORDER — SODIUM CHLORIDE 0.9 % IV SOLN: 250.0000 mL | INTRAVENOUS | Status: AC | PRN

## 2024-01-02 MED ORDER — POTASSIUM CHLORIDE 10 MEQ/100ML IV SOLN
10.0000 meq | INTRAVENOUS | Status: AC
  Administered 2024-01-02 (×3): 10 meq via INTRAVENOUS
  Filled 2024-01-02 (×2): qty 100

## 2024-01-02 MED ORDER — HYDROXYUREA 500 MG PO CAPS: 1500.0000 mg | ORAL_CAPSULE | ORAL | Status: DC

## 2024-01-02 MED ORDER — VITAMIN D 25 MCG (1000 UNIT) PO TABS
2000.0000 [IU] | ORAL_TABLET | Freq: Every day | ORAL | Status: DC
  Administered 2024-01-03 – 2024-01-09 (×10): 2000 [IU] via ORAL
  Filled 2024-01-02 (×7): qty 2

## 2024-01-02 MED ORDER — CARVEDILOL 3.125 MG PO TABS: 3.1250 mg | ORAL_TABLET | Freq: Two times a day (BID) | ORAL | Status: DC

## 2024-01-02 MED ORDER — THIAMINE HCL 100 MG/ML IJ SOLN
100.0000 mg | Freq: Every day | INTRAMUSCULAR | Status: DC
  Administered 2024-01-02 – 2024-01-03 (×2): 100 mg via INTRAVENOUS
  Filled 2024-01-02 (×3): qty 2

## 2024-01-02 MED ORDER — POTASSIUM CHLORIDE 10 MEQ/100ML IV SOLN
10.0000 meq | INTRAVENOUS | Status: AC
  Administered 2024-01-02: 10 meq via INTRAVENOUS
  Filled 2024-01-02 (×2): qty 100

## 2024-01-02 MED ORDER — LEVOTHYROXINE SODIUM 75 MCG PO TABS: 75.0000 ug | ORAL_TABLET | Freq: Every day | ORAL | Status: DC

## 2024-01-02 MED ORDER — ASPIRIN 81 MG PO TABS: 81.0000 mg | ORAL_TABLET | Freq: Every day | ORAL | Status: DC

## 2024-01-02 MED ORDER — OXYCODONE HCL 5 MG PO TABS
5.0000 mg | ORAL_TABLET | Freq: Four times a day (QID) | ORAL | Status: DC | PRN
  Administered 2024-01-03 – 2024-01-11 (×37): 5 mg via ORAL
  Filled 2024-01-02 (×28): qty 1

## 2024-01-02 MED ORDER — MAGNESIUM SULFATE 2 GM/50ML IV SOLN
2.0000 g | Freq: Once | INTRAVENOUS | Status: AC
  Administered 2024-01-02: 2 g via INTRAVENOUS
  Filled 2024-01-02: qty 50

## 2024-01-02 MED ORDER — TRAZODONE HCL 50 MG PO TABS
50.0000 mg | ORAL_TABLET | Freq: Every day | ORAL | Status: DC
  Administered 2024-01-02 – 2024-01-10 (×12): 50 mg via ORAL
  Filled 2024-01-02 (×9): qty 1

## 2024-01-02 MED ORDER — SODIUM CHLORIDE 0.9% FLUSH
3.0000 mL | Freq: Two times a day (BID) | INTRAVENOUS | Status: DC
  Administered 2024-01-02 – 2024-01-10 (×14): 3 mL via INTRAVENOUS

## 2024-01-02 MED ORDER — LEVOTHYROXINE SODIUM 100 MCG PO TABS
100.0000 ug | ORAL_TABLET | Freq: Every day | ORAL | Status: DC
  Administered 2024-01-03 – 2024-01-11 (×12): 100 ug via ORAL
  Filled 2024-01-02 (×9): qty 1

## 2024-01-02 NOTE — Progress Notes (Signed)
 Patient arrived in the unit, patient is alert and oriented, denies any chest pain, SOB, he is tacypneic and using abd muscles, V/S obtained, CCMD notified, he was incontinent, found multiple pressure injuries and wounds, will document in LDA, 12 lead EKG obtained, patient was complaining of pain on his B/L lower legs that has multiple vascular wounds, didn't find pulse with doppler, will notify MD,  All needs met, call bell in reach.   01/02/24 1007  Vitals  Temp 98.1 F (36.7 C)  Temp Source Oral  BP (!) 122/95  MAP (mmHg) 104  BP Location Left Arm  BP Method Automatic  Patient Position (if appropriate) Lying  Pulse Rate (!) 116  Pulse Rate Source Monitor  ECG Heart Rate (!) 103  Resp (!) 24  Level of Consciousness  Level of Consciousness Alert  MEWS COLOR  MEWS Score Color Yellow  Oxygen Therapy  SpO2 98 %  O2 Device Room Air  Pain Assessment  Pain Scale 0-10  Pain Score 8  Pain Location Leg  Pain Orientation Right;Left;Lower  Pain Descriptors / Indicators Aching;Sore  Pain Frequency Constant  Pain Onset On-going  Patients Stated Pain Goal 0  MEWS Score  MEWS Temp 0  MEWS Systolic 0  MEWS Pulse 1  MEWS RR 1  MEWS LOC 0  MEWS Score 2

## 2024-01-02 NOTE — Progress Notes (Addendum)
 I was paged by this patients nurse because he was hypotensive. The patients RN informed me that they had paged EP and Heart failure. When I looked on telemetry it looks like he was in VT for almost 2 minutes today from 14:1104 pm to 14:1242 pm and his ICD eventually managed to take him out of VT. His blood pressure has been stable. The most recent blood pressure was 116/67. The patient has IV amiodarone  running. Has a potassium of 2.7 and already has potassium replacement ordered. I reached out to EP to inform them of the patients VT which they were not aware of.  Signed,  Morse Clause, PA-C 01/02/2024, 5:06 PM

## 2024-01-02 NOTE — Progress Notes (Signed)
 Patient was unable to tolerate potassium infusion through PIV, informed to the pharmacist to reschedule this medication, PICC line was placed, waiting for the order to use the line from IV team.
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
## 2024-01-02 NOTE — Progress Notes (Signed)
 Peripherally Inserted Central Catheter Placement  The IV Nurse has discussed with the patient and/or persons authorized to consent for the patient, the purpose of this procedure and the potential benefits and risks involved with this procedure.  The benefits include less needle sticks, lab draws from the catheter, and the patient may be discharged home with the catheter. Risks include, but not limited to, infection, bleeding, blood clot (thrombus formation), and puncture of an artery; nerve damage and irregular heartbeat and possibility to perform a PICC exchange if needed/ordered by physician.  Alternatives to this procedure were also discussed.  Bard Power PICC patient education guide, fact sheet on infection prevention and patient information card has been provided to patient /or left at bedside.    PICC Placement Documentation  PICC Double Lumen 01/02/24 Right Basilic 43 cm 0 cm (Active)  Indication for Insertion or Continuance of Line Chronic illness with exacerbations (CF, Sickle Cell, etc.) 01/02/24 1730  Exposed Catheter (cm) 0 cm 01/02/24 1730  Site Assessment Clean, Dry, Intact 01/02/24 1730  Lumen #1 Status Flushed;Saline locked;Blood return noted 01/02/24 1730  Lumen #2 Status Flushed;Saline locked;Blood return noted 01/02/24 1730  Dressing Type Transparent;Securing device 01/02/24 1730  Dressing Status Antimicrobial disc/dressing in place;Clean, Dry, Intact 01/02/24 1730  Line Care Connections checked and tightened 01/02/24 1730  Dressing Intervention New dressing;Adhesive placed at insertion site (IV team only) 01/02/24 1730  Dressing Change Due 01/09/24 01/02/24 1730    POA patient sister signed consent at bedside.   Kayleb Warshaw Albarece 01/02/2024, 5:53 PM

## 2024-01-02 NOTE — Plan of Care (Signed)

## 2024-01-02 NOTE — Telephone Encounter (Signed)
 Patient Product/process development scientist completed.    The patient is insured through [facility]. Patient has Medicare and is not eligible for a copay card, but may be able to apply for patient assistance or Medicare RX Payment Plan (Patient Must reach out to their plan, if eligible for payment plan), if available.    Ran test claim for mexiletine (Mexitil ) 200 mg and the current 30 day co-pay is $0.00.   This test claim was processed through [facility]- copay amounts may vary at other pharmacies due to pharmacy/plan contracts, or as the patient moves through the different stages of their insurance plan.     Taylor [name], CPHT Pharmacy Technician III Certified Patient Advocate [facility] Patient Advocate Team Direct Number: [phone]  Fax: [phone]
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
## 2024-01-02 NOTE — Consult Note (Signed)
 WOC Nurse Consult Note: Reason for Consult: wounds to bilateral lower legs and feet in the setting of critical limb ischemia.  Patient with pain in bilateral lower legs.    Wound type:vascular and moisture associated skin damage (MASD) to bilateral gluteal folds (incontinence)  Pressure Injury POA: NA Measurement: gluteal bilateral erythema 4 cm x 1 cm with 1 cm x 1.5 cm nonintact area in gluteal fold.   Scattered nonintact vascular lesions to bilateral feet Left elbow 1 cm x 1 cm x 0.1 cm  Left heel lateral:  1 cm  x 0.5 cm x0.1 cm  Right heel 2 cm x 1 cm x 0.1 cm  Wound bed: yellow fibrin Drainage (amount, consistency, odor) weeping to gluteal wounds Serosanguinous to left elbow, all others dry Periwound: dry cool skin  Dressing procedure/placement/frequency: cleanse bilateral heel wounds  and left elbow wound with soap and water and pat dry. Apply foam dressings to pad and protect Paint vascular wounds to legs and feet with betadine daily.  Gerhardts butt paste to bilateral gluteal folds, keep clean and dry.   Will not follow at this time.  Please re-consult if needed.  Darice Cooley MSN, RN, FNP-BC CWON Wound, Ostomy, Continence Nurse Outpatient [facility] [phone] Pager [phone]

## 2024-01-02 NOTE — Progress Notes (Signed)
 Heart Failure Navigator Progress Note  Assessed for Heart & Vascular TOC clinic readiness.  Patient does not meet criteria due to Advanced Heart Failure Team patient of Dr. Gala Romney. .   Navigator will sign off at this time.   Rhae Hammock, BSN, Scientist, clinical (histocompatibility and immunogenetics) Only

## 2024-01-02 NOTE — Progress Notes (Signed)
 Date and time results received: 01/02/24 1420 (use smartphrase .now to insert current time)  Test: potassium Critical Value: 2.7  Name of Provider Notified: Ozell Barter T, PA  Orders Received? Or Actions Taken?: waiting for the orders.

## 2024-01-02 NOTE — Progress Notes (Signed)
 PHARMACY - ANTICOAGULATION CONSULT NOTE  Pharmacy Consult for heparin  Indication: AVR  Allergies  Allergen Reactions   Erythromycin Rash   Lisinopril Cough   Rosuvastatin Calcium      Muscle cramps   Simvastatin     Muscle cramps   Zithromax [Azithromycin] Rash    Patient Measurements: Height: 5' 9 (175.3 cm) Weight: 66.1 kg (145 lb 11.6 oz) IBW/kg (Calculated) : 70.7 HEPARIN  DW (KG): 66.1  Vital Signs: Temp: 98.1 F (36.7 C) (08/06 1146) Temp Source: Oral (08/06 1146) BP: 120/93 (08/06 1250) Pulse Rate: 110 (08/06 1306)  Labs: Recent Labs    01/02/24 1211  HGB 9.0*  HCT 27.5*  PLT 435*  LABPROT 30.2*  INR 2.7*  CREATININE 1.51*  TROPONINIHS 78*    Estimated Creatinine Clearance: 40.7 mL/min (A) (by C-G formula based on SCr of 1.51 mg/dL (H)).   Medical History: Past Medical History:  Diagnosis Date   Abdominal aortic aneurysm (AAA) (HCC)    AICD (automatic cardioverter/defibrillator) present    Atherosclerotic heart disease of native coronary artery without angina pectoris    Chronic anticoagulation 02/18/2018   Chronic systolic heart failure (HCC) 02/02/2017   Essential thrombocythemia (HCC) 02/15/2017   Hyperlipidemia    Hypertension    Hypothyroidism    Ischemic cardiomyopathy    Leg pain, central, left 04/17/2017   Hx sciatica - pain similar; no red flag signs on hx or exam    Presence of other heart-valve replacement    Presence of permanent cardiac pacemaker    Thrombocytosis    pt states he takes a chemo pill for this   Ventricular tachycardia (HCC)     Medications:  Medications Prior to Admission  Medication Sig Dispense Refill Last Dose/Taking   amiodarone  (PACERONE ) 200 MG tablet TAKE 0.5 TABLET BY MOUTH EVERY MORNING AND 1 TABLET EVERY EVENING 135 tablet 3    amoxicillin  (AMOXIL ) 500 MG capsule Take 4 capsules (2.000mg ) one hour before dental procedures. 4 capsule 0    Ascorbic Acid (CVS VITAMIN C PO) Take 1 tablet by mouth daily.       aspirin  81 MG tablet Take 81 mg by mouth daily.      carvedilol  (COREG ) 3.125 MG tablet Take 1 tablet (3.125 mg total) by mouth 2 (two) times daily with a meal. 180 tablet 3    Cholecalciferol  (VITAMIN D ) 50 MCG (2000 UT) tablet Take 2,000 Units by mouth daily.      FARXIGA  10 MG TABS tablet TAKE 1 TABLET(10 MG) BY MOUTH DAILY (Patient taking differently: Take 10 mg by mouth daily.) 90 tablet 3    furosemide  (LASIX ) 20 MG tablet Take 2 tablets (40 mg total) by mouth daily. 60 tablet 11    hydroxyurea  (HYDREA ) 500 MG capsule Take 3 tablet (1500 mg )by mouth daily on Mon,Wed,Friday and 2 tabs (1000 mg) on Tues, Thursday, Sat and Sunday 180 capsule 4    levothyroxine  (SYNTHROID ) 75 MCG tablet Take 75 mcg by mouth daily before breakfast.      potassium chloride  (KLOR-CON  M) 10 MEQ tablet Take 4 tablets (40 mEq total) by mouth daily. 120 tablet 11    spironolactone  (ALDACTONE ) 25 MG tablet Take 1 tablet (25 mg total) by mouth every other day. 45 tablet 3    traZODone  (DESYREL ) 50 MG tablet Take 50 mg by mouth at bedtime.      vitamin B-12 (CYANOCOBALAMIN ) 1000 MCG tablet Take 1,000 mcg by mouth daily.      warfarin (COUMADIN ) 2 MG tablet  Take 1 tablet (2 mg total) by mouth daily. 100 tablet 1    warfarin (COUMADIN ) 5 MG tablet TAKE 1/2 TABLET BY MOUTH DAILY OR AS DIRECTED BY COUMADIN  CLINIC. 50 tablet 1    Scheduled:   [START ON 01/03/2024] aspirin  EC  81 mg Oral Daily   cyanocobalamin   1,000 mcg Oral Daily   [START ON 01/03/2024] dapagliflozin  propanediol  10 mg Oral Daily   furosemide   80 mg Intravenous BID   Gerhardt's butt cream   Topical BID   [START ON 01/03/2024] hydroxyurea   1,000 mg Oral Daily   [START ON 01/03/2024] levothyroxine   75 mcg Oral QAC breakfast   mexiletine  200 mg Oral Q12H   pantoprazole  (PROTONIX ) IV  40 mg Intravenous Q12H   [START ON 01/03/2024] potassium chloride   40 mEq Oral Daily   potassium chloride   40 mEq Oral Once   sodium chloride  flush  3 mL Intravenous Q12H    spironolactone   25 mg Oral QODAY   thiamine  (VITAMIN B1) injection  100 mg Intravenous Daily   traZODone   50 mg Oral QHS   Vitamin D   2,000 Units Oral Daily   Infusions:   sodium chloride      amiodarone  60 mg/hr (01/02/24 1236)   magnesium  sulfate bolus IVPB     potassium chloride       Assessment: Pt has been on coumadin  for her AVR. Goal INR 2-3 per outpt encounter. Heparin  order for bridging. INR is still 2.7 today. We will start heparin  when INR less than 2.   Hgb 9, plt wnl  Goal of Therapy:  Heparin  level 0.3-0.7 units/ml Monitor platelets by anticoagulation protocol: Yes   Plan:  Heparin  when INR <2  Sergio Batch, PharmD, BCIDP, AAHIVP, CPP Infectious Disease Pharmacist 01/02/2024 2:53 PM

## 2024-01-02 NOTE — Plan of Care (Signed)
  Problem: Clinical Measurements: Goal: Ability to maintain clinical measurements within normal limits will improve Outcome: Progressing Goal: Will remain free from infection Outcome: Progressing Goal: Diagnostic test results will improve Outcome: Progressing Goal: Respiratory complications will improve Outcome: Progressing Goal: Cardiovascular complication will be avoided Outcome: Progressing   Problem: Clinical Measurements: Goal: Will remain free from infection Outcome: Progressing   Problem: Clinical Measurements: Goal: Diagnostic test results will improve Outcome: Progressing   Problem: Activity: Goal: Risk for activity intolerance will decrease Outcome: Progressing   Problem: Nutrition: Goal: Adequate nutrition will be maintained Outcome: Progressing   Problem: Pain Managment: Goal: General experience of comfort will improve and/or be controlled Outcome: Progressing   Problem: Safety: Goal: Ability to remain free from injury will improve Outcome: Progressing   Problem: Skin Integrity: Goal: Risk for impaired skin integrity will decrease Outcome: Progressing
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
## 2024-01-03 ENCOUNTER — Inpatient Hospital Stay (HOSPITAL_COMMUNITY)

## 2024-01-03 DIAGNOSIS — F17210 Nicotine dependence, cigarettes, uncomplicated: Secondary | ICD-10-CM

## 2024-01-03 DIAGNOSIS — I472 Ventricular tachycardia, unspecified: Secondary | ICD-10-CM

## 2024-01-03 DIAGNOSIS — E43 Unspecified severe protein-calorie malnutrition: Secondary | ICD-10-CM | POA: Insufficient documentation

## 2024-01-03 DIAGNOSIS — I70234 Atherosclerosis of native arteries of right leg with ulceration of heel and midfoot: Secondary | ICD-10-CM

## 2024-01-03 DIAGNOSIS — Z9889 Other specified postprocedural states: Secondary | ICD-10-CM

## 2024-01-03 DIAGNOSIS — Z95828 Presence of other vascular implants and grafts: Secondary | ICD-10-CM

## 2024-01-03 DIAGNOSIS — I5023 Acute on chronic systolic (congestive) heart failure: Secondary | ICD-10-CM | POA: Diagnosis not present

## 2024-01-03 DIAGNOSIS — L97429 Non-pressure chronic ulcer of left heel and midfoot with unspecified severity: Secondary | ICD-10-CM

## 2024-01-03 DIAGNOSIS — L97519 Non-pressure chronic ulcer of other part of right foot with unspecified severity: Secondary | ICD-10-CM

## 2024-01-03 DIAGNOSIS — I70235 Atherosclerosis of native arteries of right leg with ulceration of other part of foot: Secondary | ICD-10-CM

## 2024-01-03 DIAGNOSIS — I70244 Atherosclerosis of native arteries of left leg with ulceration of heel and midfoot: Secondary | ICD-10-CM

## 2024-01-03 DIAGNOSIS — L97419 Non-pressure chronic ulcer of right heel and midfoot with unspecified severity: Secondary | ICD-10-CM

## 2024-01-03 LAB — CBC WITH DIFFERENTIAL/PLATELET
Abs Granulocyte: 2.9 K/uL (ref 1.5–6.5)
Abs Immature Granulocytes: 0.05 K/uL (ref 0.00–0.07)
Basophils Absolute: 0 K/uL (ref 0.0–0.1)
Basophils Relative: 1 %
Eosinophils Absolute: 0 K/uL (ref 0.0–0.5)
Eosinophils Relative: 1 %
HCT: 23.7 % — ABNORMAL LOW (ref 39.0–52.0)
Hemoglobin: 7.7 g/dL — ABNORMAL LOW (ref 13.0–17.0)
Immature Granulocytes: 1 %
Lymphocytes Relative: 3 %
Lymphs Abs: 0.1 K/uL — ABNORMAL LOW (ref 0.7–4.0)
MCH: 36.8 pg — ABNORMAL HIGH (ref 26.0–34.0)
MCHC: 32.5 g/dL (ref 30.0–36.0)
MCV: 113.4 fL — ABNORMAL HIGH (ref 80.0–100.0)
Monocytes Absolute: 0.6 K/uL (ref 0.1–1.0)
Monocytes Relative: 16 %
Neutro Abs: 2.9 K/uL (ref 1.7–7.7)
Neutrophils Relative %: 78 %
Platelets: 372 K/uL (ref 150–400)
RBC: 2.09 MIL/uL — ABNORMAL LOW (ref 4.22–5.81)
RDW: 32.3 % — ABNORMAL HIGH (ref 11.5–15.5)
Smear Review: NORMAL
WBC: 3.7 K/uL — ABNORMAL LOW (ref 4.0–10.5)
nRBC: 1.9 % — ABNORMAL HIGH (ref 0.0–0.2)

## 2024-01-03 LAB — PHOSPHORUS: Phosphorus: 3.5 mg/dL (ref 2.5–4.6)

## 2024-01-03 LAB — HEMOGLOBIN AND HEMATOCRIT, BLOOD
HCT: 28.2 % — ABNORMAL LOW (ref 39.0–52.0)
Hemoglobin: 9.3 g/dL — ABNORMAL LOW (ref 13.0–17.0)

## 2024-01-03 LAB — MAGNESIUM: Magnesium: 2.1 mg/dL (ref 1.7–2.4)

## 2024-01-03 LAB — PROTIME-INR
INR: 2.7 — ABNORMAL HIGH (ref 0.8–1.2)
Prothrombin Time: 30.2 s — ABNORMAL HIGH (ref 11.4–15.2)

## 2024-01-03 LAB — COOXEMETRY PANEL
Carboxyhemoglobin: 1.8 % — ABNORMAL HIGH (ref 0.5–1.5)
Methemoglobin: <0.7 % (ref 0.0–1.5)
O2 Saturation: 64.7 %
Total hemoglobin: 11.5 g/dL — ABNORMAL LOW (ref 12.0–16.0)

## 2024-01-03 LAB — COMPREHENSIVE METABOLIC PANEL WITH GFR
ALT: 62 U/L — ABNORMAL HIGH (ref 0–44)
AST: 35 U/L (ref 15–41)
Albumin: 2 g/dL — ABNORMAL LOW (ref 3.5–5.0)
Alkaline Phosphatase: 58 U/L (ref 38–126)
Anion gap: 8 (ref 5–15)
BUN: 37 mg/dL — ABNORMAL HIGH (ref 8–23)
CO2: 25 mmol/L (ref 22–32)
Calcium: 8.3 mg/dL — ABNORMAL LOW (ref 8.9–10.3)
Chloride: 98 mmol/L (ref 98–111)
Creatinine, Ser: 1.56 mg/dL — ABNORMAL HIGH (ref 0.61–1.24)
GFR, Estimated: 47 mL/min — ABNORMAL LOW (ref >60)
Glucose, Bld: 109 mg/dL — ABNORMAL HIGH (ref 70–99)
Potassium: 3.5 mmol/L (ref 3.5–5.1)
Sodium: 131 mmol/L — ABNORMAL LOW (ref 135–145)
Total Bilirubin: 0.9 mg/dL (ref 0.0–1.2)
Total Protein: 5.8 g/dL — ABNORMAL LOW (ref 6.5–8.1)

## 2024-01-03 MED ORDER — SODIUM CHLORIDE 0.9 % IV SOLN
500.0000 mg | Freq: Once | INTRAVENOUS | Status: AC
  Administered 2024-01-03: 500 mg via INTRAVENOUS
  Filled 2024-01-03: qty 25

## 2024-01-03 MED ORDER — POTASSIUM CHLORIDE CRYS ER 20 MEQ PO TBCR
40.0000 meq | EXTENDED_RELEASE_TABLET | Freq: Once | ORAL | Status: AC
  Administered 2024-01-03: 40 meq via ORAL
  Filled 2024-01-03: qty 2

## 2024-01-03 MED ORDER — IRON SUCROSE 500 MG IVPB - SIMPLE MED
500.0000 mg | Freq: Once | INTRAVENOUS | Status: DC
  Filled 2024-01-03: qty 275

## 2024-01-03 MED ORDER — AMIODARONE HCL IN DEXTROSE 360-4.14 MG/200ML-% IV SOLN
30.0000 mg/h | INTRAVENOUS | Status: AC
  Administered 2024-01-03: 30 mg/h via INTRAVENOUS
  Filled 2024-01-03 (×2): qty 200

## 2024-01-03 NOTE — Progress Notes (Signed)
 PHARMACY - ANTICOAGULATION CONSULT NOTE  Pharmacy Consult for heparin  Indication: AVR  Allergies  Allergen Reactions   Erythromycin Rash   Lisinopril Cough   Rosuvastatin Calcium      Muscle cramps   Simvastatin     Muscle cramps   Zithromax [Azithromycin] Rash    Patient Measurements: Height: 5' 9 (175.3 cm) Weight: 68.2 kg (150 lb 5.7 oz) IBW/kg (Calculated) : 70.7 HEPARIN  DW (KG): 66.1  Vital Signs: Temp: 98.3 F (36.8 C) (08/07 0730) Temp Source: Oral (08/07 0730) BP: 102/57 (08/07 0730) Pulse Rate: 70 (08/07 0730)  Labs: Recent Labs    01/02/24 1211 01/03/24 0500  HGB 9.0* 7.7*  HCT 27.5* 23.7*  PLT 435* 372  LABPROT 30.2*  --   INR 2.7*  --   CREATININE 1.51* 1.56*  TROPONINIHS 78*  --     Estimated Creatinine Clearance: 40.7 mL/min (A) (by C-G formula based on SCr of 1.56 mg/dL (H)).   Medical History: Past Medical History:  Diagnosis Date   Abdominal aortic aneurysm (AAA) (HCC)    AICD (automatic cardioverter/defibrillator) present    Atherosclerotic heart disease of native coronary artery without angina pectoris    Chronic anticoagulation 02/18/2018   Chronic systolic heart failure (HCC) 02/02/2017   Essential thrombocythemia (HCC) 02/15/2017   Hyperlipidemia    Hypertension    Hypothyroidism    Ischemic cardiomyopathy    Leg pain, central, left 04/17/2017   Hx sciatica - pain similar; no red flag signs on hx or exam    Presence of other heart-valve replacement    Presence of permanent cardiac pacemaker    Thrombocytosis    pt states he takes a chemo pill for this   Ventricular tachycardia (HCC)     Medications:  Medications Prior to Admission  Medication Sig Dispense Refill Last Dose/Taking   amiodarone  (PACERONE ) 200 MG tablet TAKE 0.5 TABLET BY MOUTH EVERY MORNING AND 1 TABLET EVERY EVENING 135 tablet 3    amoxicillin  (AMOXIL ) 500 MG capsule Take 4 capsules (2.000mg ) one hour before dental procedures. 4 capsule 0    Ascorbic Acid  ([employer]S VITAMIN C PO) Take 1 tablet by mouth daily.      aspirin  81 MG tablet Take 81 mg by mouth daily.      carvedilol  (COREG ) 3.125 MG tablet Take 1 tablet (3.125 mg total) by mouth 2 (two) times daily with a meal. 180 tablet 3    Cholecalciferol  (VITAMIN D ) 50 MCG (2000 UT) tablet Take 2,000 Units by mouth daily.      FARXIGA  10 MG TABS tablet TAKE 1 TABLET(10 MG) BY MOUTH DAILY (Patient taking differently: Take 10 mg by mouth daily.) 90 tablet 3    furosemide  (LASIX ) 20 MG tablet Take 2 tablets (40 mg total) by mouth daily. 60 tablet 11    hydroxyurea  (HYDREA ) 500 MG capsule Take 3 tablet (1500 mg )by mouth daily on Mon,Wed,Friday and 2 tabs (1000 mg) on Tues, Thursday, Sat and Sunday 180 capsule 4    levothyroxine  (SYNTHROID ) 75 MCG tablet Take 75 mcg by mouth daily before breakfast.      potassium chloride  (KLOR-CON  M) 10 MEQ tablet Take 4 tablets (40 mEq total) by mouth daily. 120 tablet 11    spironolactone  (ALDACTONE ) 25 MG tablet Take 1 tablet (25 mg total) by mouth every other day. 45 tablet 3    traZODone  (DESYREL ) 50 MG tablet Take 50 mg by mouth at bedtime.      vitamin B-12 (CYANOCOBALAMIN ) 1000 MCG tablet Take  1,000 mcg by mouth daily.      warfarin (COUMADIN ) 2 MG tablet Take 1 tablet (2 mg total) by mouth daily. 100 tablet 1    warfarin (COUMADIN ) 5 MG tablet TAKE 1/2 TABLET BY MOUTH DAILY OR AS DIRECTED BY COUMADIN  CLINIC. 50 tablet 1    Scheduled:   aspirin  EC  81 mg Oral Daily   Chlorhexidine  Gluconate Cloth  6 each Topical Daily   cholecalciferol   2,000 Units Oral Daily   cyanocobalamin   1,000 mcg Oral Daily   dapagliflozin  propanediol  10 mg Oral Daily   furosemide   80 mg Intravenous BID   Gerhardt's butt cream   Topical BID   hydroxyurea   1,000 mg Oral Q T,Th,S,Su   And   [START ON 01/04/2024] hydroxyurea   1,500 mg Oral Q M,W,F   levothyroxine   100 mcg Oral Q0600   mexiletine  200 mg Oral Q12H   pantoprazole  (PROTONIX ) IV  40 mg Intravenous Q12H   potassium chloride    40 mEq Oral Daily   sodium chloride  flush  10-40 mL Intracatheter Q12H   sodium chloride  flush  3 mL Intravenous Q12H   spironolactone   25 mg Oral Daily   thiamine  (VITAMIN B1) injection  100 mg Intravenous Daily   traZODone   50 mg Oral QHS   Infusions:   sodium chloride      amiodarone  30 mg/hr (01/03/24 0914)    Assessment: Pt presenting after ICD shock. Has been on warfarin for mechAVR, also has hx Afib. Plan for heparin  order for bridging when INR <2.   PTA regimen as of 12/19/2023: 1 mg daily except 2 mg MWF.   INR today remains the same at 2.7. Hgb 7.7, plt 372.   Goal of Therapy:  INR 2-3 as per outpatient visit  Heparin  level 0.3-0.7 units/ml Monitor platelets by anticoagulation protocol: Yes   Plan:  Plan for heparin  infusion when INR <2 Monitor daily CBC, INR, and for s/sx of bleeding   Thank you for allowing pharmacy to participate in this patient's care,  Suzen Sour, PharmD, BCCCP Clinical Pharmacist  Phone: [phone] 01/03/2024 9:35 AM  Please check AMION for all [facility] phone numbers After 10:00 PM, call Main Pharmacy [phone]
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
## 2024-01-03 NOTE — Progress Notes (Signed)
 Initial Nutrition Assessment  DOCUMENTATION CODES:  Severe malnutrition in context of chronic illness  INTERVENTION:  Liberalize diet to regular to promote adequate oral intake Encourage smaller, more frequent oral intake Snacks TID between meals Mighty Shake TID with meals, each supplement provides 330 kcals and 9 grams of protein Magic cup TID with meals, each supplement provides 290 kcal and 9 grams of protein Check vitamin labs to assess for any possible deficiencies: Vitamin A, Vitamin C, Vitamin D , zinc, copper , CRP Recommend discontinuing Vitamin B12 given significantly elevated lab of 2,300  NUTRITION DIAGNOSIS:  Severe Malnutrition related to chronic illness (FTT, CAD, mechanical AVR, HF, severe PAD) as evidenced by severe fat depletion, severe muscle depletion, energy intake < or equal to 75% for > or equal to 1 month  GOAL:  Patient will meet greater than or equal to 90% of their needs  MONITOR:  PO intake, Supplement acceptance, Diet advancement, Labs, Weight trends  REASON FOR ASSESSMENT:  Consult Assessment of nutrition requirement/status, Wound healing  ASSESSMENT:  Pt admitted with syncope, ICD shock and failure to thrive. PMH significant for mechanical AVR on chronic coumadin , VF arrest s/p ICD, CAD s/p CABG, chronic systolic HF, AAA, severe PAD w/chronic nonhealing wounds.  Spoke with pt at bedside.  He states that over the last year he has had a significantly poor appetite and does not eat much which has been a significant decline for him. He typically only takes bites of food daily including peanut butter crackers and fruit. His daughter bought him low/no vitamin k containing protein supplements (clear juice with 20g protein from [employer]) but cannot recall the name of the supplements.   Pt denies difficulty chewing or swallowing. Cannot recall taking any new medications that could contribute but states that recently everything tastes chalky and all tastes the  same.   Meal completions: 8/6: 0% dinner  This morning he reports taking 2 bites of eggs and fruit.  We discussed different types of nutrition supplements. Encouraged him to have his daughter bring some of the clear liquid supplements. Encouraged smaller, more frequent oral intake.   He recalls taking Vitamin B12, Vitamin C with zinc, Vitamin D3 and a MVI. Discussed checking vitamin labs to assess for depletions. He is amenable to this plan.   Medications: Vitamin D3 2,000 units daily, vitamin B12 daily, lasix  80mg  BID, klor-con , thiamine , IV iron  sucrose  Labs:  Sodium 131 BUN 37 Cr 1.56 ALT 62 GFR 47 Iron  38  NUTRITION - FOCUSED PHYSICAL EXAM:  Flowsheet Row Most Recent Value  Orbital Region Severe depletion  Upper Arm Region Severe depletion  Thoracic and Lumbar Region Severe depletion  Buccal Region Severe depletion  Temple Region Moderate depletion  Clavicle Bone Region Severe depletion  Clavicle and Acromion Bone Region Severe depletion  Scapular Bone Region Severe depletion  Dorsal Hand Severe depletion  Patellar Region Severe depletion  Anterior Thigh Region Severe depletion  Posterior Calf Region Severe depletion  Edema (RD Assessment) None  Hair Reviewed  Eyes Reviewed  Mouth Reviewed  Skin Reviewed  Nails Reviewed    Diet Order:   Diet Order             Diet regular Room service appropriate? Yes; Fluid consistency: Thin  Diet effective now                  EDUCATION NEEDS:  Education needs have been addressed  Skin:  Skin Assessment: Skin Integrity Issues: Skin Integrity Issues:: Stage II, Unstageable, Other (Comment)  Stage II: medial sacrum, R buttock, L elbow Unstageable: L heel, R heel Other: Vascular ulcer- R ankle/foot, L foot/ankle  Last BM:  8/7 type 3 small  Height:   Ht Readings from Last 1 Encounters:  01/02/24 5' 9 (1.753 m)    Weight:   Wt Readings from Last 1 Encounters:  01/03/24 68.2 kg   BMI:  Body mass index is  22.2 kg/m.  Estimated Nutritional Needs:   Kcal:  1800-2000  Protein:  95-110g  Fluid:  >/=1.8L  Allie Delshawn Stech, RDN, LDN Clinical Nutrition See AMiON for contact information.

## 2024-01-03 NOTE — Progress Notes (Addendum)
 Advanced Heart Failure Rounding Note  Cardiologist: Dr. Cherrie  Chief Complaint: acute on chronic systolic heart failure and VT  Patient Profile   Taylor [name] is a 73 y.o male with mechnical AVR on chronic coumadin , h/o VF arrest s/p ICD, CAD s/p CABG, chronic systolic HF (EF 20-25%), AAA, h/o essential thrombosis and severe PAD w/ chronic nonhealing wounds, S/p EVAR for AAA requiring bilateral common femoral endarterectomies, admitted for VT w/ ICD shocks and a/c CHF w/ volume overload. W/ FTT prior to admit and had stopped taking many of his medications.   Subjective:    Remains on amio gtt at 30/hr + mexiletine. No further VT since last run ~2 pm yesterday. V-paced 70s.   K repleated, 2.7>>3.5 today  Mg 2.1  Has PICC. Co-ox 65% today  Diuresing w/ IV Lasix , 2.2L in UOP yesterday. CVP 12.  SCr stable 1.51>>1.56   Denies CP. No current resting dyspnea. C/w 8/10 resting foot pain.      Objective:   Weight Range: 68.2 kg Body mass index is 22.2 kg/m.   Vital Signs:   Temp:  [98.1 F (36.7 C)-99.4 F (37.4 C)] 98.3 F (36.8 C) (08/07 0730) Pulse Rate:  [68-116] 70 (08/07 0730) Resp:  [13-24] 16 (08/07 0730) BP: (99-122)/(54-95) 102/57 (08/07 0730) SpO2:  [93 %-100 %] 99 % (08/07 0730) Weight:  [66.1 kg-68.2 kg] 68.2 kg (08/07 0447) Last BM Date : 01/01/24  Weight change: Filed Weights   01/02/24 1306 01/03/24 0447  Weight: 66.1 kg 68.2 kg    Intake/Output:   Intake/Output Summary (Last 24 hours) at 01/03/2024 0913 Last data filed at 01/03/2024 0830 Gross per 24 hour  Intake 687.89 ml  Output 2200 ml  Net -1512.11 ml      Physical Exam  CVP 12 GENERAL: fatigued, chronically ill appearing male. NAD Lungs- decreased BS at the bases  CARDIAC:  JVP elevated to jaw         Normal rate with regular rhythm. Trace b/l LEE edema.  ABDOMEN: Soft, non-tender, non-distended.  EXTREMITIES: decreased pedal pulses bilaterally, + rt foot wound   NEUROLOGIC: No  obvious FND  Telemetry   V paced 70s. Last run of VT ~2 pm yesterday   EKG    No new EKG to review   Labs    CBC Recent Labs    01/02/24 1211 01/03/24 0500  WBC 4.3 3.7*  NEUTROABS  --  2.9  HGB 9.0* 7.7*  HCT 27.5* 23.7*  MCV 113.2* 113.4*  PLT 435* 372   Basic Metabolic Panel Recent Labs    91/93/74 1211 01/03/24 0500  NA 133* 131*  K 2.7* 3.5  CL 100 98  CO2 21* 25  GLUCOSE 127* 109*  BUN 41* 37*  CREATININE 1.51* 1.56*  CALCIUM  9.0 8.3*  MG 1.9 2.1  PHOS  --  3.5   Liver Function Tests Recent Labs    01/02/24 1211 01/03/24 0500  AST 56* 35  ALT 88* 62*  ALKPHOS 70 58  BILITOT 1.3* 0.9  PROT 6.8 5.8*  ALBUMIN  2.5* 2.0*   No results for input(s): LIPASE, AMYLASE in the last 72 hours. Cardiac Enzymes No results for input(s): CKTOTAL, CKMB, CKMBINDEX, TROPONINI in the last 72 hours.  BNP: BNP (last 3 results) Recent Labs    05/02/23 1435 08/01/23 1501 11/22/23 1240  BNP 753.7* 560.2* 1,219.5*    ProBNP (last 3 results) Recent Labs    08/29/23 1158  PROBNP 9,442*  D-Dimer No results for input(s): DDIMER in the last 72 hours. Hemoglobin A1C No results for input(s): HGBA1C in the last 72 hours. Fasting Lipid Panel No results for input(s): CHOL, HDL, LDLCALC, TRIG, CHOLHDL, LDLDIRECT in the last 72 hours. Thyroid  Function Tests Recent Labs    01/02/24 1709  TSH 17.167*    Other results:   Imaging    US  Abdomen Complete Result Date: 01/03/2024 CLINICAL DATA:  Abnormal LFTs and right flank pain EXAM: ABDOMEN ULTRASOUND COMPLETE COMPARISON:  None Available. FINDINGS: Gallbladder: Cholecystectomy. Common bile duct: Diameter: 7 mm. Liver: No focal lesion identified. Within normal limits in parenchymal echogenicity. Portal vein is patent on color Doppler imaging with normal direction of blood flow towards the liver. IVC: No abnormality visualized. Pancreas: Limited visualization due to bowel gas. Spleen:  The spleen is enlarged measuring 14.4 cm in greatest dimension. Right Kidney: Length: 11.5 cm. Echogenicity within normal limits. No mass or hydronephrosis visualized. Left Kidney: Length: 7.4 cm. Echogenicity within normal limits. No mass or hydronephrosis visualized. Abdominal aorta: Aorto bi-iliac stent. The excluded aneurysm sac measures 4.2 cm. Not meaningfully changed compared with CT 11/16/2022 given differences in technique. Other findings: None. IMPRESSION: 1. Splenomegaly. 2. Otherwise unremarkable abdominal ultrasound. Electronically Signed   By: Norman Gatlin M.D.   On: 01/03/2024 01:23   DG CHEST PORT 1 VIEW Result Date: 01/02/2024 CLINICAL DATA:  Status post PICC line placement. EXAM: PORTABLE CHEST 1 VIEW COMPARISON:  May 17, 2020. FINDINGS: Stable cardiomediastinal silhouette. Status post coronary artery bypass graft as well as cardiac valve repair. Left-sided defibrillator is unchanged. Right-sided PICC line is noted with distal tip in expected position of the SVC. Minimal left basilar subsegmental atelectasis is noted with small pleural effusion. Right lung is clear. IMPRESSION: Interval placement of right-sided PICC line with distal tip in expected position of the SVC. Minimal left basilar subsegmental atelectasis is noted with small pleural effusion. Electronically Signed   By: Lynwood Landy Raddle M.D.   On: 01/02/2024 18:16   US  EKG SITE RITE Result Date: 01/02/2024 If Site Rite image not attached, placement could not be confirmed due to current cardiac rhythm.    Medications:     Scheduled Medications:  aspirin  EC  81 mg Oral Daily   Chlorhexidine  Gluconate Cloth  6 each Topical Daily   cholecalciferol   2,000 Units Oral Daily   cyanocobalamin   1,000 mcg Oral Daily   dapagliflozin  propanediol  10 mg Oral Daily   furosemide   80 mg Intravenous BID   Gerhardt's butt cream   Topical BID   hydroxyurea   1,000 mg Oral Q T,Th,S,Su   And   [START ON 01/04/2024] hydroxyurea   1,500 mg  Oral Q M,W,F   levothyroxine   100 mcg Oral Q0600   mexiletine  200 mg Oral Q12H   pantoprazole  (PROTONIX ) IV  40 mg Intravenous Q12H   potassium chloride   40 mEq Oral Daily   sodium chloride  flush  10-40 mL Intracatheter Q12H   sodium chloride  flush  3 mL Intravenous Q12H   spironolactone   25 mg Oral Daily   thiamine  (VITAMIN B1) injection  100 mg Intravenous Daily   traZODone   50 mg Oral QHS    Infusions:  sodium chloride      amiodarone       PRN Medications: sodium chloride , acetaminophen , ondansetron  (ZOFRAN ) IV, oxyCODONE , sodium chloride  flush, sodium chloride  flush, traMADol       Assessment/Plan   1. VT w/ ICD Shocks - H/o VF Arrest and H/o CHB. S/p [employer]  CRT-D device, followed by EP (Dr. Fernande) - recent occurrence in setting of chronic systolic heart failure, EF 25%. Also known CAD s/p CABG in 2017 but no recent CP. Initial labs at OSH K 3.5, Mg 2.0, HS troponin 126 -> 114 TSH 8.74 Free T4 0.97. Repeat K [facility] at [facility] was 2.7 - suspect VT triggered by worsening HF w/ marked volume overload + hypokalemia  - Rhythm now stable, V-paced. K repleated to 3.5  - continue amio gtt at 30/hr  - continue mexiletine 200 mg bid   - keep K > 4.0 and Mg > 2.0. Monitor closely w/ ongoing diuresis    2. Acute on Chronic Systolic Heart Failure - Echo (05/2016 @ [facility]): EF 40% - Echo (12/18) EF 25-30% - Echo (12/20): EF 20-25%. Diffuse hypokinesis, No LVH. RV normal. Mild AI, trace MR - s/p CRT-D.  - Echo 5/23 EF 25-30% - Echo 6/24 EF 25-30%  - Echo 5/25 EF 25-30% - Chronically NYHA III-IV mainly limited currently by LE wounds - Admitted w/ marked volume overload. Had missed several days of home meds (feeling poorly) - Co-ox ok at 65% today - Volume improving w/ diuresis but remains fluid overloaded. CVP 12  - Continue IV Lasix  80 mg bid w/ aggressive K supp  - Continue to hold Coreg  for now until fully diuresed. Continue amio gtt + Mexiletine for VT suppression - Continue Farxiga  10  mg daily - Continue Spiro 25 mg daily  - off ARB due to CKD and h/o low BP  - Has failed eval for Barostim and CCM - Currently not candidate for advanced therapies   3. CAD - h/o remote PCI in 1994 at [facility] - s/p LHC in 2017 in the setting of Vfib Arrest w/ 3V CAD treated w/ CABG x 3 - he denies angina. HS trop trend not c/w ACS  - Continue ASA  - statin intolerant, needs to be consider for PSK9i  - holding ? blocker for now    4. Mechanical Aortic Valve - Stable on last echo  - On coumadin  PTA.  INR pending - If INR < 2.0 will cover w/ heparin  gtt, in the event he may need invasive procedures this admit    5. PAD with AAA with nonhealing LE wounds - 6/24 EVAR for AAA requiring bilateral common femoral endarterectomies.  He had to go back on 11/10/2022 for a thrombosed left common femoral artery after endarterectomy. Then went to [facility] for left fem-pop. Course c/b LLE foot ulcer - Continues to struggle with nonhealing wounds. Has been followed at [facility] and here. - he has Vascular surgery consultation at [facility] scheduled next wk  - He had been trying to get in w/ [facility] due to concern for potential need for amputation and effort for limb salvage - given ongoing resting pain, would recommend [facility] inpatient vascular team evaluate    6. IDA - Hgb 9.0>>7.7  - Ferritin 79, T sat 12% - FOBT ordered - needs IV Fe  - transfuse for hgb < 7.0  - INR pending   Length of Stay: 1  Brittainy Simmons, PA-C  01/03/2024, 9:13 AM  Advanced Heart Failure Team Pager [phone] (M-F; 7a - 5p)  Please contact [facility] for night-coverage after hours (5p -7a ) and weekends on [email or web address]  Patient seen with PA, I formulated the plan and agree with the above note .  No further VT overnight, remains on mexiletine and amiodarone  gtt 30 mg/hr.    He diuresed  with Lasix  80 mg IV bid, I/Os net negative 1515, CVP 12 on my read today.  Co-ox 65%.  Creatinine stable at 1.56.   General: NAD Neck: JVP  12-14 cm, no thyromegaly or thyroid  nodule.  Lungs: Clear to auscultation bilaterally with normal respiratory effort. CV: Nondisplaced PMI.  Heart regular S1/S2 with mechanical S2, no S3/S4, 1/6 SEM RUSB.  No peripheral edema.    Abdomen: Soft, nontender, no hepatosplenomegaly, no distention.  Skin: Intact without lesions or rashes.  Neurologic: Alert and oriented x 3.  Psych: Normal affect. Extremities: No clubbing or cyanosis. Ulcerations on feet.  HEENT: Normal.   Patient admitted with VT, suspect triggered by volume overload and poor compliance with medication regimen as well as hypokalemia.  HS-TnI only mildly elevated, suspect not ACS. Has [employer] CRT-D device.  - Would hold off on cath.  - Continue mexiletine.  - Continue IV amiodarone  for now. EP following.   He remains volume overloaded on exam, CVP 12.  Reasonable diuresis with IV Lasix  yesterday.  Co-ox 65% so would hold off on inotrope (also given recent VT). Creatinine stable at 1.56. - Continue Lasix  80 mg IV bid, hold off on metolazone  for now to avoid hypokalemia.  - Continue Farxiga   - Continue spironolactone  25 daily.   Chronic Fe deficiency anemia, follow and transfuse hgb < 7.  He has had IV Fe this admission.   He has bilateral foot wounds, poorly healing.  Will consult vascular.   INR 2.7 with mechanical aortic valve.  Heparin  gtt when INR < 2.   Ezra Shuck 01/03/2024 1:11 PM

## 2024-01-03 NOTE — Progress Notes (Signed)
 Pt moved to pressure redistribution mattress. Tolerated well.

## 2024-01-03 NOTE — Consult Note (Signed)
 Consult NOTE    Taylor [name] [facility]  FMW:[id number] DOB: Aug 07, 1950 DOA: 01/02/2024 PCP: Joella Sieving, DO  No chief complaint on file.   Brief Narrative:   73 yo with hx CAD s/p CABG, HFrEF (25-30%), s/p AICD, mechanical AV placement on coumadin , essential thrombocytosis, HTN, AAA s/p EVAR 10/2022 with bilateral common femoral endarterectomies c/b L femoral artery thrombosis s/p redo L ileofemoral endarterectomy and profundaplasty with L GSV vein patch presenting as Taylor [name] transfer from [facility] in [place] after being seen for evaluation of ICD shock.    Hospitalists were consulted for medical management.    Assessment & Plan:   Principal Problem:   Ventricular tachyarrhythmia (HCC)  VT with ICD Shocks Per cards, thought VT triggered by HF exacerbation + hypokalemia Amiodarone , mexiletine Monitor lytes, K>4, mag>2   Acute on Chronic Systolic Heart Failure Diuresis per cards Echo 09/2023 with EF 25-30% Coreg  currently on hold.  Spironolactone , farxiga .    Mechanical Aortic Valve On coumadin , INR 2.7 today Plan for heparin  gtt once INR <2   CAD s/p CABG PAD AAA Nonhealing LE Wounds Aspirin , statin intolerant  CTA 10/2022 with occlusion of proximal SFA on L  S/p EVAR for AAA requiring bilateral common femoral endarterectomies Seems like cards planning for vascular eval while here? Will review   Essential Thrombocytosis  H/o Lacunar Strokes  Hx Seizures Followed by hematology - looks like he follows with [facility] (last note 08/29/2023) hydroxyurea   Hypothyroidism TSH 17 -> synthroid  increased to 100 mcg, needs repeat labs in 4-6 weeks  Macrocytic Anemia Baseline appears around 9 S/p 2 units pRBC at [facility] (with presenting Hb 7) Will monitor, mild downtrend today - repeat this PM  Elevated LFT's Mild, suspect due to congestion with HF above Trend with diuresis  Splenomegaly Noted on US   Hx Etoh Abuse Denied current use    DVT prophylaxis: INR therapeutic,  heparin  gtt when INR <2 Code Status: full Family Communication: none Disposition:   Status is: Inpatient Remains inpatient appropriate because: need for continued inpatient care   Consultants:  Hospitalists Cardiology is primary  Procedures:  non  Antimicrobials:  Anti-infectives (From admission, onward)    None       Subjective: C/o chronic LE pain   Objective: Vitals:   01/03/24 0414 01/03/24 0447 01/03/24 0730 01/03/24 1130  BP: 99/69  (!) 102/57 105/66  Pulse: 70 68 70 70  Resp: 18 18 16 14   Temp: 98.8 F (37.1 C)  98.3 F (36.8 C) 97.8 F (36.6 C)  TempSrc: Oral  Oral Oral  SpO2: 100% 99% 99% 100%  Weight:  68.2 kg    Height:        Intake/Output Summary (Last 24 hours) at 01/03/2024 1234 Last data filed at 01/03/2024 1131 Gross per 24 hour  Intake 687.89 ml  Output 2650 ml  Net -1962.11 ml   Filed Weights   01/02/24 1306 01/03/24 0447  Weight: 66.1 kg 68.2 kg    Examination:  General exam: Appears calm and comfortable  Respiratory system: Clear to auscultation. Respiratory effort normal. Cardiovascular system: RRR Gastrointestinal system: Abdomen is nondistended, soft and nontender.  Central nervous system: Alert and oriented. No focal neurological deficits. Extremities: RLE ttp, chronic LE wounds     Data Reviewed: I have personally reviewed following labs and imaging studies  CBC: Recent Labs  Lab 01/02/24 1211 01/03/24 0500  WBC 4.3 3.7*  NEUTROABS  --  2.9  HGB 9.0* 7.7*  HCT 27.5* 23.7*  MCV 113.2* 113.4*  PLT 435* 372    Basic Metabolic Panel: Recent Labs  Lab 01/02/24 1211 01/03/24 0500  NA 133* 131*  K 2.7* 3.5  CL 100 98  CO2 21* 25  GLUCOSE 127* 109*  BUN 41* 37*  CREATININE 1.51* 1.56*  CALCIUM  9.0 8.3*  MG 1.9 2.1  PHOS  --  3.5    GFR: Estimated Creatinine Clearance: 40.7 mL/min (Taylor [name]) (by C-G formula based on SCr of 1.56 mg/dL (H)).  Liver Function Tests: Recent Labs  Lab 01/02/24 1211 01/03/24 0500   AST 56* 35  ALT 88* 62*  ALKPHOS 70 58  BILITOT 1.3* 0.9  PROT 6.8 5.8*  ALBUMIN  2.5* 2.0*    CBG: No results for input(s): GLUCAP in the last 168 hours.   No results found for this or any previous visit (from the past 240 hours).       Radiology Studies: US  Abdomen Complete Result Date: 01/03/2024 CLINICAL DATA:  Abnormal LFTs and right flank pain EXAM: ABDOMEN ULTRASOUND COMPLETE COMPARISON:  None Available. FINDINGS: Gallbladder: Cholecystectomy. Common bile duct: Diameter: 7 mm. Liver: No focal lesion identified. Within normal limits in parenchymal echogenicity. Portal vein is patent on color Doppler imaging with normal direction of blood flow towards the liver. IVC: No abnormality visualized. Pancreas: Limited visualization due to bowel gas. Spleen: The spleen is enlarged measuring 14.4 cm in greatest dimension. Right Kidney: Length: 11.5 cm. Echogenicity within normal limits. No mass or hydronephrosis visualized. Left Kidney: Length: 7.4 cm. Echogenicity within normal limits. No mass or hydronephrosis visualized. Abdominal aorta: Aorto bi-iliac stent. The excluded aneurysm sac measures 4.2 cm. Not meaningfully changed compared with CT 11/16/2022 given differences in technique. Other findings: None. IMPRESSION: 1. Splenomegaly. 2. Otherwise unremarkable abdominal ultrasound. Electronically Signed   By: Norman Gatlin M.D.   On: 01/03/2024 01:23   DG CHEST PORT 1 VIEW Result Date: 01/02/2024 CLINICAL DATA:  Status post PICC line placement. EXAM: PORTABLE CHEST 1 VIEW COMPARISON:  May 17, 2020. FINDINGS: Stable cardiomediastinal silhouette. Status post coronary artery bypass graft as well as cardiac valve repair. Left-sided defibrillator is unchanged. Right-sided PICC line is noted with distal tip in expected position of the SVC. Minimal left basilar subsegmental atelectasis is noted with small pleural effusion. Right lung is clear. IMPRESSION: Interval placement of right-sided PICC  line with distal tip in expected position of the SVC. Minimal left basilar subsegmental atelectasis is noted with small pleural effusion. Electronically Signed   By: Lynwood Landy Raddle M.D.   On: 01/02/2024 18:16   US  EKG SITE RITE Result Date: 01/02/2024 If Site Rite image not attached, placement could not be confirmed due to current cardiac rhythm.       Scheduled Meds:  aspirin  EC  81 mg Oral Daily   Chlorhexidine  Gluconate Cloth  6 each Topical Daily   cholecalciferol   2,000 Units Oral Daily   cyanocobalamin   1,000 mcg Oral Daily   dapagliflozin  propanediol  10 mg Oral Daily   furosemide   80 mg Intravenous BID   Gerhardt's butt cream   Topical BID   hydroxyurea   1,000 mg Oral Q T,Th,S,Su   And   [START ON 01/04/2024] hydroxyurea   1,500 mg Oral Q M,W,F   levothyroxine   100 mcg Oral Q0600   mexiletine  200 mg Oral Q12H   pantoprazole  (PROTONIX ) IV  40 mg Intravenous Q12H   potassium chloride   40 mEq Oral Daily   potassium chloride   40 mEq Oral Once   sodium chloride  flush  10-40  mL Intracatheter Q12H   sodium chloride  flush  3 mL Intravenous Q12H   spironolactone   25 mg Oral Daily   thiamine  (VITAMIN B1) injection  100 mg Intravenous Daily   traZODone   50 mg Oral QHS   Continuous Infusions:  amiodarone  30 mg/hr (01/03/24 0914)   iron  sucrose       LOS: 1 day    Time spent: over 30 min     Meliton Monte, MD [facility]   To contact the attending provider between 7A-7P or the covering provider during after hours 7P-7A, please log into the web site [email or web address] and access using universal [place] password for that web site. If you do not have the password, please call the hospital operator.  01/03/2024, 12:34 PM

## 2024-01-03 NOTE — Telephone Encounter (Signed)
 Patient is hospitalized at [facility]

## 2024-01-03 NOTE — Progress Notes (Addendum)
 Patient Name: Taylor [name] [facility] Date of Encounter: 01/03/2024  Primary Cardiologist: None Electrophysiologist: Taylor Plotner [name], MD  Interval Summary   Shocked again yesterday afternoon with change in atrial rate afterward. ? If was an AT.   Legs are painful.  Admitted to HF team he had stopped many of his medications PTA (Yesterday stated he was missing meds about twice a week)  Vital Signs    Vitals:   01/03/24 0004 01/03/24 0414 01/03/24 0447 01/03/24 0730  BP: 100/60 99/69  (!) 102/57  Pulse: 70 70 68 70  Resp: 20 18 18 16   Temp: 99.4 F (37.4 C) 98.8 F (37.1 C)  98.3 F (36.8 C)  TempSrc: Oral Oral  Oral  SpO2: 97% 100% 99% 99%  Weight:   68.2 kg   Height:        Intake/Output Summary (Last 24 hours) at 01/03/2024 0931 Last data filed at 01/03/2024 0830 Gross per 24 hour  Intake 687.89 ml  Output 2200 ml  Net -1512.11 ml   Filed Weights   01/02/24 1306 01/03/24 0447  Weight: 66.1 kg 68.2 kg    Physical Exam    GEN- NAD, Alert and oriented  Lungs- Clear to ausculation bilaterally, normal work of breathing Cardiac- Regular rate and rhythm, no murmurs, rubs or gallops GI- soft, NT, ND, + BS Extremities- no clubbing or cyanosis. No edema  Telemetry    V paced 70s (personally reviewed)  Hospital Course    Taylor [name] is a 73 y.o. male with a history of AoV abnormality s/p mechanical AVR (on coumadin ), h/o VF arrest in 2017 s/p CRT-D, CAD s/p CABG x3 + redo mAVR, chronic systolic HF (EF 20-25%), paroxysmal AF, AAA s/p EVAR with b/l fem endarterectomies c/b thrombosis of L fem and foot ulcer, essential thrombosis, ETOH, cocaine, and tobacco abuse who is being admitted today for the evaluation of ICD shock.  Initially presented to [facility] in [place] and transferred for cardiac care.   Have asked IM to consult and attend in setting of anemia, likely depression/Failure to thrive, and severe leg wounds with PAD.   Assessment & Plan    H/o  VF arrest s/p ICD VT with appropriate shocks Presenting rhythm yesterday was AS with intermittent VP in the 110-120s; After shock ~ 1400 HR in 70s. ? AT.  Resume IV amidoarone 30/mg hr.  Continue mexitil  200 mg daily. Unlikely to take TID meds Coreg  on hold with acute CHF.    Acute on chronic systolic CHF NYHA IIIb-IV symptoms chronically Coox 65%, CVP 12. Diuresis per HF team. Appreciate their care.    CAD s/p CABG Denies chest pain and troponin flat. Do not think emergent need for LHC.  Troponin flat    Acute on chronic anemia Hgb 7.0 on admission to [facility] and s/p 2 uPRBCs HGb 9.0 -> 7.7. FOBT pending Low iron  saturation. Ferritin WNL. Per IM.   S/p Mechanical AV On coumadin    PAD with AAA Followed by VVS and Podiatry. Limits him functionally from pain. Had plans for AAA surgery at [facility] next week. Not sure he would tolerate this currently.   ETOH abuse ?Depression Failure To Thrive CIWA protocol Per Medicine   For questions or updates, please contact  [facility] Please consult [email or web address] for contact info under     Signed, Taylor Prentice Passey, PA-C  01/03/2024, 9:31 AM    I have seen and examined this patient with Taylor [name].  Agree with above, note added to reflect  my findings.  Had an episode of ventricular tachycardia yesterday.  Potassium has been supplemented.  No further episodes of VT.  Potassium has improved.  Continues to complain of leg and foot pain.  Remains weak and fatigued.  GEN: No acute distress.   Neck: No JVD Cardiac: RRR, no murmurs, rubs, or gallops.  Respiratory: normal BS bases bilaterally. GI: Soft, nontender, non-distended  MS: No edema; No deformity. Neuro:  Nonfocal  Skin: warm and dry, device site well healed Psych: Normal affect    VT/VF arrest: Had another episode of VT overnight last night.  Continue amiodarone  and mexiletine.  His potassium was significantly low, continue repletion.  Not a candidate for advanced  therapies at this time. Acute on chronic systolic heart failure: CVP 12.  Diuresis per heart failure team Coronary artery disease: No current chest pain.  Troponins flat.  No indication for left heart catheterization Chronic anemia: Hemoglobin down to 7.  Plan per internal medicine Mechanical aortic valve: On heparin  PAD: Had plans for AAA surgery next week at [facility].   Taylor [name] M. Taylor Petron MD 01/03/2024 11:20 AM

## 2024-01-04 ENCOUNTER — Inpatient Hospital Stay (HOSPITAL_COMMUNITY)

## 2024-01-04 DIAGNOSIS — R936 Abnormal findings on diagnostic imaging of limbs: Secondary | ICD-10-CM

## 2024-01-04 DIAGNOSIS — I70229 Atherosclerosis of native arteries of extremities with rest pain, unspecified extremity: Secondary | ICD-10-CM | POA: Diagnosis not present

## 2024-01-04 DIAGNOSIS — I5023 Acute on chronic systolic (congestive) heart failure: Secondary | ICD-10-CM | POA: Diagnosis not present

## 2024-01-04 DIAGNOSIS — I472 Ventricular tachycardia, unspecified: Secondary | ICD-10-CM | POA: Diagnosis not present

## 2024-01-04 LAB — CBC
HCT: 25.1 % — ABNORMAL LOW (ref 39.0–52.0)
Hemoglobin: 8.2 g/dL — ABNORMAL LOW (ref 13.0–17.0)
MCH: 37.3 pg — ABNORMAL HIGH (ref 26.0–34.0)
MCHC: 32.7 g/dL (ref 30.0–36.0)
MCV: 114.1 fL — ABNORMAL HIGH (ref 80.0–100.0)
Platelets: 389 K/uL (ref 150–400)
RBC: 2.2 MIL/uL — ABNORMAL LOW (ref 4.22–5.81)
RDW: 31.8 % — ABNORMAL HIGH (ref 11.5–15.5)
WBC: 5.6 K/uL (ref 4.0–10.5)
nRBC: 0 % (ref 0.0–0.2)

## 2024-01-04 LAB — BASIC METABOLIC PANEL WITH GFR
Anion gap: 13 (ref 5–15)
Anion gap: 11 (ref 5–15)
BUN: 39 mg/dL — ABNORMAL HIGH (ref 8–23)
BUN: 38 mg/dL — ABNORMAL HIGH (ref 8–23)
CO2: 24 mmol/L (ref 22–32)
CO2: 26 mmol/L (ref 22–32)
Calcium: 8.6 mg/dL — ABNORMAL LOW (ref 8.9–10.3)
Calcium: 8.8 mg/dL — ABNORMAL LOW (ref 8.9–10.3)
Chloride: 98 mmol/L (ref 98–111)
Chloride: 96 mmol/L — ABNORMAL LOW (ref 98–111)
Creatinine, Ser: 1.67 mg/dL — ABNORMAL HIGH (ref 0.61–1.24)
Creatinine, Ser: 1.64 mg/dL — ABNORMAL HIGH (ref 0.61–1.24)
GFR, Estimated: 43 mL/min — ABNORMAL LOW (ref >60)
GFR, Estimated: 44 mL/min — ABNORMAL LOW (ref >60)
Glucose, Bld: 126 mg/dL — ABNORMAL HIGH (ref 70–99)
Glucose, Bld: 116 mg/dL — ABNORMAL HIGH (ref 70–99)
Potassium: 4.5 mmol/L (ref 3.5–5.1)
Potassium: 4.2 mmol/L (ref 3.5–5.1)
Sodium: 135 mmol/L (ref 135–145)
Sodium: 133 mmol/L — ABNORMAL LOW (ref 135–145)

## 2024-01-04 LAB — PROTIME-INR
INR: 2.7 — ABNORMAL HIGH (ref 0.8–1.2)
Prothrombin Time: 30.1 s — ABNORMAL HIGH (ref 11.4–15.2)

## 2024-01-04 LAB — VITAMIN D 25 HYDROXY (VIT D DEFICIENCY, FRACTURES): Vit D, 25-Hydroxy: 45.89 ng/mL (ref 30–100)

## 2024-01-04 LAB — C-REACTIVE PROTEIN: CRP: 14.7 mg/dL — ABNORMAL HIGH (ref <1.0)

## 2024-01-04 LAB — VAS US ABI WITH/WO TBI: Left ABI: 1.42

## 2024-01-04 LAB — COOXEMETRY PANEL
Carboxyhemoglobin: 2 % — ABNORMAL HIGH (ref 0.5–1.5)
Methemoglobin: <0.7 % (ref 0.0–1.5)
O2 Saturation: 66.8 %
Total hemoglobin: 7.4 g/dL — ABNORMAL LOW (ref 12.0–16.0)

## 2024-01-04 MED ORDER — METOLAZONE 5 MG PO TABS
2.5000 mg | ORAL_TABLET | Freq: Once | ORAL | Status: AC
  Administered 2024-01-04: 2.5 mg via ORAL
  Filled 2024-01-04: qty 1

## 2024-01-04 MED ORDER — AMIODARONE HCL 200 MG PO TABS
200.0000 mg | ORAL_TABLET | Freq: Two times a day (BID) | ORAL | Status: DC
  Administered 2024-01-04 – 2024-01-11 (×21): 200 mg via ORAL
  Filled 2024-01-04 (×15): qty 1

## 2024-01-04 MED ORDER — POTASSIUM CHLORIDE CRYS ER 20 MEQ PO TBCR
40.0000 meq | EXTENDED_RELEASE_TABLET | Freq: Once | ORAL | Status: AC
  Administered 2024-01-04: 40 meq via ORAL
  Filled 2024-01-04: qty 2

## 2024-01-04 MED ORDER — PANTOPRAZOLE SODIUM 40 MG PO TBEC
40.0000 mg | DELAYED_RELEASE_TABLET | Freq: Two times a day (BID) | ORAL | Status: DC
  Administered 2024-01-04 – 2024-01-11 (×21): 40 mg via ORAL
  Filled 2024-01-04: qty 2
  Filled 2024-01-04 (×14): qty 1

## 2024-01-04 MED ORDER — THIAMINE MONONITRATE 100 MG PO TABS
100.0000 mg | ORAL_TABLET | Freq: Every day | ORAL | Status: DC
  Administered 2024-01-04 – 2024-01-11 (×11): 100 mg via ORAL
  Filled 2024-01-04 (×8): qty 1

## 2024-01-04 MED ORDER — POTASSIUM CHLORIDE CRYS ER 20 MEQ PO TBCR: 20.0000 meq | EXTENDED_RELEASE_TABLET | Freq: Once | ORAL | Status: DC

## 2024-01-04 NOTE — Plan of Care (Signed)

## 2024-01-04 NOTE — TOC Initial Note (Signed)
 Transition of Care [facility]) - Initial/Assessment Note    Patient Details  Name: Taylor [name] MRN: [id number] Date of Birth: [birth year]  Transition of Care [facility]) CM/SW Contact:    Justina Delcia Czar, RN Phone Number: [phone] 01/04/2024, 5:51 PM  Clinical Narrative:                 Spoke to pt and states he lives alone. His sister, Taylor [name] takes him to appt. States he was able to care for himself at home. Gave permission to speak to sister, Taylor [name].  Pt agreeable to [facility].  Will need PT/OT evaluation.  Will continue to follow for dc needs.    Expected Discharge Plan: Home w Home Health Services Barriers to Discharge: Continued Medical Work up   Patient Goals and CMS Choice Patient states their goals for this hospitalization and ongoing recovery are:: wants to get better CMS Medicare.gov Compare Post Acute Care list provided to:: Patient Choice offered to / list presented to : Patient      Expected Discharge Plan and Services   Discharge Planning Services: CM Consult Post Acute Care Choice: Home Health Living arrangements for the past 2 months: Single Family Home                                      Prior Living Arrangements/Services Living arrangements for the past 2 months: Single Family Home Lives with:: Self Patient language and need for interpreter reviewed:: Yes Do you feel safe going back to the place where you live?: Yes      Need for Family Participation in Patient Care: No (Comment) Care giver support system in place?: Yes (comment) Current home services: DME (rollator, rolling walker, cane, wheelchair) Criminal Activity/Legal Involvement Pertinent to Current Situation/Hospitalization: No - Comment as needed  Activities of Daily Living      Permission Sought/Granted Permission sought to share information with : Case Manager, Family Supports, PCP Permission granted to share information with : Yes, Verbal Permission Granted  Share Information  with NAME: Taylor [name]  Permission granted to share info w AGENCY: Home Health, DME, PCP  Permission granted to share info w Relationship: sister  Permission granted to share info w Contact Information: [phone]  Emotional Assessment Appearance:: Appears stated age Attitude/Demeanor/Rapport: Engaged Affect (typically observed): Accepting Orientation: : Oriented to Self, Oriented to Place, Oriented to  Time, Oriented to Situation   Psych Involvement: No (comment)  Admission diagnosis:  Ventricular tachyarrhythmia (HCC) [I47.20] Patient Active Problem List   Diagnosis Date Noted   Protein-calorie malnutrition, severe 01/03/2024   Ventricular tachyarrhythmia (HCC) 01/02/2024   AAA (abdominal aortic aneurysm) (HCC) 11/09/2022   Abdominal aortic aneurysm (AAA) greater than 5.5 cm in diameter in male (HCC) 11/09/2022   PAD (peripheral artery disease) (HCC) 11/09/2022   PVC's (premature ventricular contractions) 10/31/2021   Heart block AV complete (HCC) 10/31/2021   Atrial flutter (HCC)    Persistent atrial fibrillation (HCC) 09/29/2020   Secondary hypercoagulable state (HCC) 09/29/2020   Ischemic cardiomyopathy 08/04/2019   ICD (implantable cardioverter-defibrillator) in place 08/04/2019   Essential hypertension 06/23/2019   Sepsis (HCC) 06/23/2019   AKI (acute kidney injury) (HCC) 06/23/2019   Hyponatremia 06/23/2019   Normal anion gap metabolic acidosis 06/23/2019   Normocytic anemia 06/23/2019   Acute cholecystitis 06/22/2019   Chronic anticoagulation 02/18/2018   Leg pain, central, left 04/17/2017   Essential thrombocythemia (HCC) 02/15/2017  Encounter for therapeutic drug monitoring 02/15/2017   Aortic valve replaced 02/15/2017   Chronic systolic heart failure (HCC) 02/02/2017   PCP:  Joella Sieving, DO Pharmacy:   [facility]) [phone] - [place], [place] - [address] AT [facility] [address] [place] [address] Phone: [phone]  Fax: [phone]  [facility] [address] [place] [address] Phone: [phone] Fax: [phone]     Social Drivers of Health (SDOH) Social History: SDOH Screenings   Food Insecurity: Food Insecurity Present (01/02/2024)  Housing: Unknown (01/02/2024)  Transportation Needs: No Transportation Needs (01/02/2024)  Utilities: Not At Risk (01/02/2024)  Depression (PHQ2-9): Low Risk  (04/03/2022)  Financial Resource Strain: Medium Risk (09/26/2023)   Received from [facility]  Social Connections: Unknown (01/02/2024)  Tobacco Use: Medium Risk (12/27/2023)   Received from [facility]   SDOH Interventions:     Readmission Risk Interventions    11/14/2022   11:39 AM  Readmission Risk Prevention Plan  Transportation Screening Complete  PCP or Specialist Appt within 5-7 Days Complete  Home Care Screening Complete  Medication Review (RN CM) Complete

## 2024-01-04 NOTE — Progress Notes (Signed)
 PHARMACY - ANTICOAGULATION CONSULT NOTE  Pharmacy Consult for heparin  Indication: AVR  Allergies  Allergen Reactions   Erythromycin Rash   Zithromax [Azithromycin] Rash    Patient Measurements: Height: 5' 9 (175.3 cm) Weight: 68.2 kg (150 lb 5.7 oz) IBW/kg (Calculated) : 70.7 HEPARIN  DW (KG): 66.1  Vital Signs: Temp: 97.8 F (36.6 C) (08/08 0821) Temp Source: Oral (08/08 0821) BP: 123/72 (08/08 0821) Pulse Rate: 70 (08/08 0821)  Labs: Recent Labs    01/02/24 1211 01/03/24 0500 01/03/24 0515 01/03/24 1522 01/04/24 0420  HGB 9.0* 7.7*  --  9.3* 8.2*  HCT 27.5* 23.7*  --  28.2* 25.1*  PLT 435* 372  --   --  389  LABPROT 30.2*  --  30.2*  --  30.1*  INR 2.7*  --  2.7*  --  2.7*  CREATININE 1.51* 1.56*  --   --  1.67*  TROPONINIHS 78*  --   --   --   --     Estimated Creatinine Clearance: 38 mL/min (A) (by C-G formula based on SCr of 1.67 mg/dL (H)).   Medical History: Past Medical History:  Diagnosis Date   Abdominal aortic aneurysm (AAA) (HCC)    AICD (automatic cardioverter/defibrillator) present    Atherosclerotic heart disease of native coronary artery without angina pectoris    Chronic anticoagulation 02/18/2018   Chronic systolic heart failure (HCC) 02/02/2017   Essential thrombocythemia (HCC) 02/15/2017   Hyperlipidemia    Hypertension    Hypothyroidism    Ischemic cardiomyopathy    Leg pain, central, left 04/17/2017   Hx sciatica - pain similar; no red flag signs on hx or exam    Presence of other heart-valve replacement    Presence of permanent cardiac pacemaker    Thrombocytosis    pt states he takes a chemo pill for this   Ventricular tachycardia (HCC)     Medications:  Medications Prior to Admission  Medication Sig Dispense Refill Last Dose/Taking   amiodarone  (PACERONE ) 200 MG tablet TAKE 0.5 TABLET BY MOUTH EVERY MORNING AND 1 TABLET EVERY EVENING (Patient taking differently: Take 100-200 mg by mouth 2 (two) times daily. TAKE 0.5  TABLET BY MOUTH EVERY MORNING AND 1 TABLET EVERY EVENING) 135 tablet 3 Past Week   Ascorbic Acid ([employer] VITAMIN C PO) Take 1 tablet by mouth daily.   Past Week   aspirin  81 MG tablet Take 81 mg by mouth daily.   Past Week   carvedilol  (COREG ) 3.125 MG tablet Take 1 tablet (3.125 mg total) by mouth 2 (two) times daily with a meal. 180 tablet 3 Past Week   Cholecalciferol  (VITAMIN D ) 50 MCG (2000 UT) tablet Take 2,000 Units by mouth daily.   Past Week   FARXIGA  10 MG TABS tablet TAKE 1 TABLET(10 MG) BY MOUTH DAILY (Patient taking differently: Take 10 mg by mouth daily.) 90 tablet 3 Past Week   furosemide  (LASIX ) 20 MG tablet Take 2 tablets (40 mg total) by mouth daily. 60 tablet 11 Past Week   hydroxyurea  (HYDREA ) 500 MG capsule Take 3 tablet (1500 mg )by mouth daily on Mon,Wed,Friday and 2 tabs (1000 mg) on Tues, Thursday, Sat and Sunday (Patient taking differently: See admin instructions. MWF one tab in am 2 in evening. One am evening every other day) 180 capsule 4 Past Week   ibuprofen (ADVIL) 800 MG tablet Take 800 mg by mouth daily at 12 noon.   Past Week   levothyroxine  (SYNTHROID ) 75 MCG tablet Take 75 mcg  by mouth daily before breakfast.   Past Week   potassium chloride  (KLOR-CON  M) 10 MEQ tablet Take 4 tablets (40 mEq total) by mouth daily. (Patient taking differently: Take 20 mEq by mouth daily.) 120 tablet 11 Past Week   spironolactone  (ALDACTONE ) 25 MG tablet Take 1 tablet (25 mg total) by mouth every other day. 45 tablet 3 Past Week   traZODone  (DESYREL ) 50 MG tablet Take 50 mg by mouth at bedtime.   Past Week   vitamin B-12 (CYANOCOBALAMIN ) 1000 MCG tablet Take 1,000 mcg by mouth daily.   Past Week   warfarin (COUMADIN ) 2 MG tablet Take 1 tablet (2 mg total) by mouth daily. (Patient taking differently: Take 2 mg by mouth daily. As directed by MD) 100 tablet 1 01/01/2024   warfarin (COUMADIN ) 5 MG tablet TAKE 1/2 TABLET BY MOUTH DAILY OR AS DIRECTED BY COUMADIN  CLINIC. 50 tablet 1 Past Week    Scheduled:   amiodarone   200 mg Oral BID   aspirin  EC  81 mg Oral Daily   Chlorhexidine  Gluconate Cloth  6 each Topical Daily   cholecalciferol   2,000 Units Oral Daily   dapagliflozin  propanediol  10 mg Oral Daily   furosemide   80 mg Intravenous BID   Gerhardt's butt cream   Topical BID   levothyroxine   100 mcg Oral Q0600   mexiletine  200 mg Oral Q12H   pantoprazole   40 mg Oral BID   potassium chloride   40 mEq Oral Daily   potassium chloride   40 mEq Oral Once   sodium chloride  flush  10-40 mL Intracatheter Q12H   sodium chloride  flush  3 mL Intravenous Q12H   spironolactone   25 mg Oral Daily   thiamine   100 mg Oral Daily   traZODone   50 mg Oral QHS   Infusions:     Assessment: Pt presenting after ICD shock. Has been on warfarin for mechAVR, also has hx Afib. Plan for heparin  order for bridging when INR <2.   PTA regimen as of 12/19/2023: 1 mg daily except 2 mg MWF.   INR today remains the same at 2.7. Hgb 8.2, plt 389.   Goal of Therapy:  INR 2-3 as per outpatient visit  Heparin  level 0.3-0.7 units/ml Monitor platelets by anticoagulation protocol: Yes   Plan:  Plan for heparin  infusion when INR <2 Monitor daily CBC, INR, and for s/sx of bleeding   Thank you for allowing pharmacy to participate in this patient's care,  Harlene Denna Berdine JONETTA ARABELLA, [facility] Clinical Pharmacist  01/04/2024 11:00 AM   [facility] pharmacy phone numbers are listed on [email or web address]

## 2024-01-04 NOTE — Progress Notes (Signed)
 Patient seen and examined today on evening rounds. Overall unchanged appearance of bilateral feet. Noninvasive testing personally reviewed. Severe multifocal right superficial femoral lesions noted on ultrasound. Plan with possible intervention as soon as cardiac status is stable for such an intervention. Dr. Pearline will see tomorrow.  [facility]

## 2024-01-04 NOTE — Progress Notes (Addendum)
 Patient Name: Antonio Creswell [facility] Date of Encounter: 01/04/2024  Primary Cardiologist: None Electrophysiologist: Marty Sadlowski [name], MD  Interval Summary   VT remains quiescent.   Vascular consulted and potentially planning intervention pending cardiac stability.   Vital Signs    Vitals:   01/03/24 2007 01/03/24 2345 01/04/24 0359 01/04/24 0821  BP: 110/63 116/68 122/70 123/72  Pulse: 70 70 70 70  Resp: 20 20 20 15   Temp: 98.8 F (37.1 C) 99.1 F (37.3 C) 97.9 F (36.6 C) 97.8 F (36.6 C)  TempSrc: Oral Oral Oral Oral  SpO2: 93% 90% 99% 99%  Weight:      Height:        Intake/Output Summary (Last 24 hours) at 01/04/2024 0848 Last data filed at 01/04/2024 0749 Gross per 24 hour  Intake 36.25 ml  Output 2495 ml  Net -2458.75 ml   Filed Weights   01/02/24 1306 01/03/24 0447  Weight: 66.1 kg 68.2 kg    Physical Exam    GEN- NAD, Alert and oriented  Lungs- Clear to ausculation bilaterally, normal work of breathing Cardiac- Regular rate and rhythm, no murmurs, rubs or gallops GI- soft, NT, ND, + BS Extremities- no clubbing or cyanosis. No edema  Telemetry    NSR VP in 70s, No further VT (personally reviewed)  Hospital Course    Leldon Steege is a 73 y.o. male with a history of AoV abnormality s/p mechanical AVR (on coumadin ), h/o VF arrest in 2017 s/p CRT-D, CAD s/p CABG x3 + redo mAVR, chronic systolic HF (EF 20-25%), paroxysmal AF, AAA s/p EVAR with b/l fem endarterectomies c/b thrombosis of L fem and foot ulcer, essential thrombosis, ETOH, cocaine, and tobacco abuse who is being admitted today for the evaluation of ICD shock.  Initially presented to [facility] hospital in [place] and transferred for cardiac care.    Have asked IM to consult and in setting of anemia, likely depression/Failure to thrive, and severe leg wounds with PAD.   Assessment & Plan    H/o VF arrest s/p ICD VT with appropriate shocks Presenting rhythm yesterday was AS with  intermittent VP in the 110-120s; After shock ~ 1400 HR in 70s. ? AT.  Transition amiodarone  to po at 200 mg BID.  Continue mexitil  200 mg daily. Unlikely to take TID meds Coreg  on hold with acute CHF.    Acute on chronic systolic CHF NYHA IIIb-IV symptoms chronically Coox 66.8% this am.  Diuresis per HF team. Appreciate their care.    CAD s/p CABG Denies chest pain and troponin flat.    Acute on chronic anemia Hgb 7.0 on admission to [facility] and s/p 2 uPRBCs FOBT pending Low iron  saturation. Ferritin WNL. HGB  8.2* (08/08 0420) today Per IM.   S/p Mechanical AV To start heparin  once INR drifts down for possible procedures   PAD with AAA Followed by VVS and Podiatry. Vascular consulted Planning Arterial Duplex and ABI/TBIs today.  Limits him functionally from pain. Had plans for AAA surgery at [facility] next week.    ETOH abuse ?Depression Failure To Thrive CIWA protocol Per Medicine   For questions or updates, please contact [facility] Please consult [email or web address] for contact info under     Signed, Ozell Prentice Passey, PA-C  01/04/2024, 8:48 AM    I have seen and examined this patient with [name].  Agree with above, note added to reflect my findings.  Feeling well today.  Continues to complain of leg pain.  No further  ventricular tachycardia noted.  GEN: No acute distress.   Neck: No JVD Cardiac: RRR, no murmurs, rubs, or gallops.  Respiratory: normal BS bases bilaterally. GI: Soft, nontender, non-distended  MS: No edema; No deformity. Neuro:  Nonfocal  Skin: warm and dry, device site well healed Psych: Normal affect    VT/VF cardiac arrest: Received ICD shocks.  Natasja Niday transition to p.o. amiodarone .  Keller Mikels continue mexiletine 200 mg twice daily.  No further ventricular tachycardia.  Was severely hypokalemic which was likely the trigger. Acute on chronic systolic heart failure: Diuresis per heart failure team. Coronary artery disease: Post CABG.  No  current chest pain.  Acute on chronic anemia: Plan per internal medicine Post mechanical aortic valve:: Continue anticoagulation.  Amorah Sebring switch to heparin  once INR below 2. Peripheral arterial disease: Vascular surgery consulted  Gatlyn Lipari M. Mairany Bruno MD 01/04/2024 8:38 PM

## 2024-01-04 NOTE — Consult Note (Signed)
 Consult NOTE    Taylor [name]  FMW:[id number] DOB: [birth year] DOA: 01/02/2024 PCP: Taylor Sieving, DO  No chief complaint on file.   Brief Narrative:   73 yo with hx CAD s/p CABG, HFrEF (25-30%), s/p AICD, mechanical AV placement on coumadin , essential thrombocytosis, HTN, AAA s/p EVAR 10/2022 with bilateral common femoral endarterectomies c/b L femoral artery thrombosis s/p redo L ileofemoral endarterectomy and profundaplasty with L GSV vein patch presenting as Taylor [name] transfer from [facility] in [place] after being seen for evaluation of ICD shock.    Hospitalists were consulted for medical management.    Assessment & Plan:   Principal Problem:   Ventricular tachyarrhythmia (HCC) Active Problems:   Protein-calorie malnutrition, severe  VT with ICD Shocks Per cards, thought VT triggered by HF exacerbation + hypokalemia Amiodarone , mexiletine Monitor lytes, K>4, mag>2   Acute on Chronic Systolic Heart Failure Diuresis per cards Echo 09/2023 with EF 25-30% Coreg  currently on hold.  Spironolactone , farxiga .    Mechanical Aortic Valve On coumadin , INR 2.7 today Plan for heparin  gtt once INR <2   CAD s/p CABG PAD AAA Nonhealing LE Wounds Aspirin , statin intolerant  CTA 10/2022 with occlusion of proximal SFA on L  S/p EVAR for AAA requiring bilateral common femoral endarterectomies Appreciate vascular eval - considering angiogram when stable from cards standpoint - ABI with falsely elevated L ABI, noncompressible RLE arteries. LE arterial US  with 50-74% stenosis in superficial femoral artery (multiple lesions throughout superfiical femoral artery).  LLE with patent L femoral artery to popliteal artery bypass graft with no hemodynamically significant stenosis.  Essential Thrombocytosis  H/o Lacunar Strokes  Hx Seizures Followed by hematology - looks like he follows with [facility] (last note 08/29/2023) Hydroxyurea  on hold with anemia  Hypothyroidism TSH 17 -> synthroid  increased  to 100 mcg, needs repeat labs in 4-6 weeks  Macrocytic Anemia Baseline appears around 9 S/p 2 units pRBC at [facility] (with presenting Hb 7) Continue to monitor   Elevated LFT's Mild, suspect due to congestion with HF above Trend with diuresis  Splenomegaly Noted on US   Hx Etoh Abuse Denied current use    DVT prophylaxis: INR therapeutic, heparin  gtt when INR <2 Code Status: full Family Communication: none Disposition:   Status is: Inpatient Remains inpatient appropriate because: need for continued inpatient care   Consultants:  Hospitalists Cardiology is primary  Procedures:  non  Antimicrobials:  Anti-infectives (From admission, onward)    None       Subjective: No new complaints  Objective: Vitals:   01/03/24 2345 01/04/24 0359 01/04/24 0821 01/04/24 1134  BP: 116/68 122/70 123/72 (!) 117/59  Pulse: 70 70 70 69  Resp: 20 20 15 20   Temp: 99.1 F (37.3 C) 97.9 F (36.6 C) 97.8 F (36.6 C) 97.9 F (36.6 C)  TempSrc: Oral Oral Oral Oral  SpO2: 90% 99% 99% 95%  Weight:      Height:        Intake/Output Summary (Last 24 hours) at 01/04/2024 1442 Last data filed at 01/04/2024 1234 Gross per 24 hour  Intake 36.25 ml  Output 3095 ml  Net -3058.75 ml   Filed Weights   01/02/24 1306 01/03/24 0447  Weight: 66.1 kg 68.2 kg    Examination:  General: No acute distress. Cardiovascular: RRR Lungs: unlabored Neurological: Alert and oriented 3. Moves all extremities 4 with equal strength. Cranial nerves II through XII grossly intact. Extremities: legs in boots bilaterally, chronic wounds    Data Reviewed: I have personally  reviewed following labs and imaging studies  CBC: Recent Labs  Lab 01/02/24 1211 01/03/24 0500 01/03/24 1522 01/04/24 0420  WBC 4.3 3.7*  --  5.6  NEUTROABS  --  2.9  --   --   HGB 9.0* 7.7* 9.3* 8.2*  HCT 27.5* 23.7* 28.2* 25.1*  MCV 113.2* 113.4*  --  114.1*  PLT 435* 372  --  389    Basic Metabolic Panel: Recent  Labs  Lab 01/02/24 1211 01/03/24 0500 01/04/24 0420  NA 133* 131* 135  K 2.7* 3.5 4.5  CL 100 98 98  CO2 21* 25 24  GLUCOSE 127* 109* 126*  BUN 41* 37* 39*  CREATININE 1.51* 1.56* 1.67*  CALCIUM  9.0 8.3* 8.6*  MG 1.9 2.1  --   PHOS  --  3.5  --     GFR: Estimated Creatinine Clearance: 38 mL/min (Taylor [name]) (by C-G formula based on SCr of 1.67 mg/dL (H)).  Liver Function Tests: Recent Labs  Lab 01/02/24 1211 01/03/24 0500  AST 56* 35  ALT 88* 62*  ALKPHOS 70 58  BILITOT 1.3* 0.9  PROT 6.8 5.8*  ALBUMIN  2.5* 2.0*    CBG: No results for input(s): GLUCAP in the last 168 hours.   No results found for this or any previous visit (from the past 240 hours).       Radiology Studies: VAS US  ABI WITH/WO TBI Result Date: 01/04/2024  LOWER EXTREMITY DOPPLER STUDY Patient Name:  Taylor [name] [facility]  Date of Exam:   01/04/2024 Medical Rec #: [id number]              Accession #:    [id number] Date of Birth: 10-04-1950              Patient Gender: M Patient Age:   73 years Exam Location:  [facility] Procedure:      VAS US  ABI WITH/WO TBI Referring Phys: Taylor [name] --------------------------------------------------------------------------------  Indications: Rest pain. High Risk Factors: Hypertension, past history of smoking.  Vascular Interventions: Multiple interventions including EVAR and left Fem-Pop                         art BPG. Limitations: Today's exam was limited due to patient positioning and patient              intolerant to cuff pressure. Comparison Study: 10/09/2022 Falsely elevated ABI bilaterally. Performing Technologist: Taylor [name]  Examination Guidelines: Taylor [name] complete evaluation includes at minimum, Doppler waveform signals and systolic blood pressure reading at the level of bilateral brachial, anterior tibial, and posterior tibial arteries, when vessel segments are accessible. Bilateral testing is considered an integral part of Taylor [name] complete examination.  Photoelectric Plethysmograph (PPG) waveforms and toe systolic pressure readings are included as required and additional duplex testing as needed. Limited examinations for reoccurring indications may be performed as noted.  ABI Findings: +--------+------------------+-----+----------+--------+ Right   Rt Pressure (mmHg)IndexWaveform  Comment  +--------+------------------+-----+----------+--------+ Brachial                                 IV       +--------+------------------+-----+----------+--------+ PTA     197               1.71 monophasic         +--------+------------------+-----+----------+--------+ DP      254  2.21 monophasic         +--------+------------------+-----+----------+--------+ +--------+------------------+-----+-----------+-------+ Left    Lt Pressure (mmHg)IndexWaveform   Comment +--------+------------------+-----+-----------+-------+ [id number]                                       +--------+------------------+-----+-----------+-------+ PTA     127               1.10 multiphasic        +--------+------------------+-----+-----------+-------+ DP      163               1.42 triphasic          +--------+------------------+-----+-----------+-------+ +-------+-----------+-----------+------------+------------+ ABI/TBIToday's ABIToday's TBIPrevious ABIPrevious TBI +-------+-----------+-----------+------------+------------+ Right  N/C                                            +-------+-----------+-----------+------------+------------+ Left   1.42                                           +-------+-----------+-----------+------------+------------+  Left ABI is falsely elevated.  Summary: Right: Resting right ankle-brachial index indicates noncompressible right lower extremity arteries. Left: Left ankle brachial index is falsely elevated. *See table(s) above for measurements and observations.     Preliminary    VAS US  LOWER  EXTREMITY ARTERIAL DUPLEX Result Date: 01/04/2024 LOWER EXTREMITY ARTERIAL DUPLEX STUDY Patient Name:  Taylor [name]  Date of Exam:   01/04/2024 Medical Rec #: [id number]              Accession #:    [id number] Date of Birth: 06-22-1950              Patient Gender: M Patient Age:   26 years Exam Location:  [facility] Procedure:      VAS US  LOWER EXTREMITY ARTERIAL DUPLEX Referring Phys: Taylor [name] --------------------------------------------------------------------------------  Indications: Rest pain. High Risk Factors: Hypertension, past history of smoking.  Vascular Interventions: Multiple interventions including EVAR and left fem-pop                         art BPG. Current ABI:            R- N/C, L-1.42 Limitations: pt unable to tolerate any pressure near feet due to heel ulcer. Performing Technologist: Elmarie Lindau, [name]  Examination Guidelines: Anyssa Sharpless complete evaluation includes B-mode imaging, spectral Doppler, color Doppler, and power Doppler as needed of all accessible portions of each vessel. Bilateral testing is considered an integral part of Arelene Moroni complete examination. Limited examinations for reoccurring indications may be performed as noted.  +----------+--------+-----+---------------+-----------+--------+ RIGHT     PSV cm/sRatioStenosis       Waveform   Comments +----------+--------+-----+---------------+-----------+--------+ CFA Prox  102                         multiphasic         +----------+--------+-----+---------------+-----------+--------+ DFA       0                                               +----------+--------+-----+---------------+-----------+--------+  SFA Prox  260          50-74% stenosismultiphasic         +----------+--------+-----+---------------+-----------+--------+ SFA Mid   99                          multiphasic         +----------+--------+-----+---------------+-----------+--------+ SFA Distal321          50-74%  stenosismultiphasic         +----------+--------+-----+---------------+-----------+--------+ POP Prox  52                          monophasic          +----------+--------+-----+---------------+-----------+--------+ POP Distal47                          monophasic          +----------+--------+-----+---------------+-----------+--------+ ATA Distal79                          monophasic          +----------+--------+-----+---------------+-----------+--------+ PTA Distal74                          monophasic          +----------+--------+-----+---------------+-----------+--------+  Left Graft #1: Left Femoral Artery to Popliteal Artery Bypass Graft +--------------------+--------+--------+---------+--------+                     PSV cm/sStenosisWaveform Comments +--------------------+--------+--------+---------+--------+ Inflow              182             triphasic         +--------------------+--------+--------+---------+--------+ Proximal Anastomosis112             triphasic         +--------------------+--------+--------+---------+--------+ Proximal Graft      92              triphasic         +--------------------+--------+--------+---------+--------+ Mid Graft           81              triphasic         +--------------------+--------+--------+---------+--------+ Distal Graft        69              triphasic         +--------------------+--------+--------+---------+--------+ Distal Anastomosis  70              triphasic         +--------------------+--------+--------+---------+--------+ Outflow             167             triphasic         +--------------------+--------+--------+---------+--------+   Summary: Right: 50-74% stenosis noted in the superficial femoral artery. There are multiple lesions throughout the superficial femoral artery. Left: Patent left femoral artery to popliteal artery bypass graft with no hemodynamically  significant stenosis.  See table(s) above for measurements and observations.    Preliminary    US  Abdomen Complete Result Date: 01/03/2024 CLINICAL DATA:  Abnormal LFTs and right flank pain EXAM: ABDOMEN ULTRASOUND COMPLETE COMPARISON:  None Available. FINDINGS: Gallbladder: Cholecystectomy. Common bile duct: Diameter: 7 mm. Liver: No focal lesion identified. Within normal limits in parenchymal echogenicity. Portal vein is  patent on color Doppler imaging with normal direction of blood flow towards the liver. IVC: No abnormality visualized. Pancreas: Limited visualization due to bowel gas. Spleen: The spleen is enlarged measuring 14.4 cm in greatest dimension. Right Kidney: Length: 11.5 cm. Echogenicity within normal limits. No mass or hydronephrosis visualized. Left Kidney: Length: 7.4 cm. Echogenicity within normal limits. No mass or hydronephrosis visualized. Abdominal aorta: Aorto bi-iliac stent. The excluded aneurysm sac measures 4.2 cm. Not meaningfully changed compared with CT 11/16/2022 given differences in technique. Other findings: None. IMPRESSION: 1. Splenomegaly. 2. Otherwise unremarkable abdominal ultrasound. Electronically Signed   By: Norman Gatlin M.D.   On: 01/03/2024 01:23   DG CHEST PORT 1 VIEW Result Date: 01/02/2024 CLINICAL DATA:  Status post PICC line placement. EXAM: PORTABLE CHEST 1 VIEW COMPARISON:  May 17, 2020. FINDINGS: Stable cardiomediastinal silhouette. Status post coronary artery bypass graft as well as cardiac valve repair. Left-sided defibrillator is unchanged. Right-sided PICC line is noted with distal tip in expected position of the SVC. Minimal left basilar subsegmental atelectasis is noted with small pleural effusion. Right lung is clear. IMPRESSION: Interval placement of right-sided PICC line with distal tip in expected position of the SVC. Minimal left basilar subsegmental atelectasis is noted with small pleural effusion. Electronically Signed   By: Lynwood Landy Raddle  M.D.   On: 01/02/2024 18:16        Scheduled Meds:  amiodarone   200 mg Oral BID   aspirin  EC  81 mg Oral Daily   Chlorhexidine  Gluconate Cloth  6 each Topical Daily   cholecalciferol   2,000 Units Oral Daily   dapagliflozin  propanediol  10 mg Oral Daily   furosemide   80 mg Intravenous BID   Gerhardt's butt cream   Topical BID   levothyroxine   100 mcg Oral Q0600   mexiletine  200 mg Oral Q12H   pantoprazole   40 mg Oral BID   potassium chloride   40 mEq Oral Daily   sodium chloride  flush  10-40 mL Intracatheter Q12H   sodium chloride  flush  3 mL Intravenous Q12H   spironolactone   25 mg Oral Daily   thiamine   100 mg Oral Daily   traZODone   50 mg Oral QHS   Continuous Infusions:     LOS: 2 days    Time spent: over 30 min     Meliton Monte, MD [facility]   To contact the attending provider between 7A-7P or the covering provider during after hours 7P-7A, please log into the web site [email or web address] and access using universal [place] password for that web site. If you do not have the password, please call the hospital operator.  01/04/2024, 2:42 PM

## 2024-01-04 NOTE — Plan of Care (Signed)

## 2024-01-04 NOTE — Progress Notes (Addendum)
 Patient ID: Taylor [name] [facility], male   DOB: October 27, 1950, 73 y.o.   MRN: [id number]     Advanced Heart Failure Rounding Note  Cardiologist: Dr. Cherrie  Chief Complaint: acute on chronic systolic heart failure and VT  Patient Profile   Taylor [name] is a 73 y.o male with mechnical AVR on chronic coumadin , h/o VF arrest s/p ICD, CAD s/p CABG, chronic systolic HF (EF 20-25%), AAA, h/o essential thrombosis and severe PAD w/ chronic nonhealing wounds, S/p EVAR for AAA requiring bilateral common femoral endarterectomies, admitted for VT w/ ICD shocks and a/c CHF w/ volume overload. W/ FTT prior to admit and had stopped taking many of his medications.   Subjective:    Remains on amio gtt at 30 mg/hr + mexiletine. No VT overnight.   I/Os net negative 2136, creatinine 1.56 => 1.67. CVP 16 today.  Co-ox 67%.   Hgb 8.2 today.   No chest pain or dyspnea, ongoing pain in feet.     Objective:   Weight Range: 68.2 kg Body mass index is 22.2 kg/m.   Vital Signs:   Temp:  [97.8 F (36.6 C)-99.1 F (37.3 C)] 97.8 F (36.6 C) (08/08 0821) Pulse Rate:  [70] 70 (08/08 0821) Resp:  [14-20] 15 (08/08 0821) BP: (105-123)/(63-72) 123/72 (08/08 0821) SpO2:  [90 %-100 %] 99 % (08/08 0821) Last BM Date : 01/01/24  Weight change: Filed Weights   01/02/24 1306 01/03/24 0447  Weight: 66.1 kg 68.2 kg    Intake/Output:   Intake/Output Summary (Last 24 hours) at 01/04/2024 0846 Last data filed at 01/04/2024 0749 Gross per 24 hour  Intake 36.25 ml  Output 2495 ml  Net -2458.75 ml      Physical Exam  General: NAD Neck: JVP 16 cm, no thyromegaly or thyroid  nodule.  Lungs: Clear to auscultation bilaterally with normal respiratory effort. CV: Nondisplaced PMI.  Heart regular S1/S2, no S3/S4, no murmur.  No peripheral edema.    Abdomen: Soft, nontender, no hepatosplenomegaly, no distention.  Skin: Intact without lesions or rashes.  Neurologic: Alert and oriented x 3.  Psych: Normal  affect. Extremities: Bilateral pedal ulcerations.   HEENT: Normal.    Telemetry   NSR with BiV pacing, no further VT.  Personally reviewed.    EKG    No new EKG to review   Labs    CBC Recent Labs    01/03/24 0500 01/03/24 1522 01/04/24 0420  WBC 3.7*  --  5.6  NEUTROABS 2.9  --   --   HGB 7.7* 9.3* 8.2*  HCT 23.7* 28.2* 25.1*  MCV 113.4*  --  114.1*  PLT 372  --  389   Basic Metabolic Panel Recent Labs    91/93/74 1211 01/03/24 0500 01/04/24 0420  NA 133* 131* 135  K 2.7* 3.5 4.5  CL 100 98 98  CO2 21* 25 24  GLUCOSE 127* 109* 126*  BUN 41* 37* 39*  CREATININE 1.51* 1.56* 1.67*  CALCIUM  9.0 8.3* 8.6*  MG 1.9 2.1  --   PHOS  --  3.5  --    Liver Function Tests Recent Labs    01/02/24 1211 01/03/24 0500  AST 56* 35  ALT 88* 62*  ALKPHOS 70 58  BILITOT 1.3* 0.9  PROT 6.8 5.8*  ALBUMIN  2.5* 2.0*   No results for input(s): LIPASE, AMYLASE in the last 72 hours. Cardiac Enzymes No results for input(s): CKTOTAL, CKMB, CKMBINDEX, TROPONINI in the last 72 hours.  BNP: BNP (last 3 results)  Recent Labs    05/02/23 1435 08/01/23 1501 11/22/23 1240  BNP 753.7* 560.2* 1,219.5*    ProBNP (last 3 results) Recent Labs    08/29/23 1158  PROBNP 9,442*     D-Dimer No results for input(s): DDIMER in the last 72 hours. Hemoglobin A1C No results for input(s): HGBA1C in the last 72 hours. Fasting Lipid Panel No results for input(s): CHOL, HDL, LDLCALC, TRIG, CHOLHDL, LDLDIRECT in the last 72 hours. Thyroid  Function Tests Recent Labs    01/02/24 1709  TSH 17.167*    Other results:   Imaging    No results found.    Medications:     Scheduled Medications:  aspirin  EC  81 mg Oral Daily   Chlorhexidine  Gluconate Cloth  6 each Topical Daily   cholecalciferol   2,000 Units Oral Daily   dapagliflozin  propanediol  10 mg Oral Daily   furosemide   80 mg Intravenous BID   Gerhardt's butt cream   Topical BID    levothyroxine   100 mcg Oral Q0600   metolazone   2.5 mg Oral Once   mexiletine  200 mg Oral Q12H   pantoprazole  (PROTONIX ) IV  40 mg Intravenous Q12H   potassium chloride   20 mEq Oral Once   potassium chloride   40 mEq Oral Daily   sodium chloride  flush  10-40 mL Intracatheter Q12H   sodium chloride  flush  3 mL Intravenous Q12H   spironolactone   25 mg Oral Daily   thiamine  (VITAMIN B1) injection  100 mg Intravenous Daily   traZODone   50 mg Oral QHS    Infusions:  amiodarone  30 mg/hr (01/03/24 0914)    PRN Medications: acetaminophen , ondansetron  (ZOFRAN ) IV, oxyCODONE , sodium chloride  flush, sodium chloride  flush, traMADol       Assessment/Plan   1. VT w/ ICD Shocks - H/o VF Arrest and H/o CHB. S/p [employer] CRT-D device, followed by EP (Dr. Fernande) - recent occurrence in setting of chronic systolic heart failure, EF 25%. Also known CAD s/p CABG in 2017 but no recent CP. Initial labs at OSH K 3.5, Mg 2.0, HS troponin 126 -> 114 TSH 8.74 Free T4 0.97. Repeat K here at [facility] was 2.7 - Suspect VT triggered by worsening HF w/ marked volume overload + hypokalemia  - No further VT overnight.  - Can transition amiodarone  to po.  - continue mexiletine 200 mg bid   - keep K > 4.0 and Mg > 2.0. Monitor closely w/ ongoing diuresis, repeat BMET in pm.    2. Acute on Chronic Systolic Heart Failure - Echo (05/2016 @ [facility]): EF 40% - Echo (12/18) EF 25-30% - Echo (12/20): EF 20-25%. Diffuse hypokinesis, No LVH. RV normal. Mild AI, trace MR - s/p CRT-D.  - Echo 5/23 EF 25-30% - Echo 6/24 EF 25-30%  - Echo 5/25 EF 25-30% - Chronically NYHA III-IV mainly limited currently by LE wounds - Admitted w/ marked volume overload. Had missed several days of home meds (feeling poorly) - Co-ox ok at 67% today, no IV inotrope.  - Good diuresis yesterday but CVP remains 16.  - Continue IV Lasix  80 mg bid + metolazone  2.5 x 1 today w/ aggressive K supplementation and repeat BMET in pm.   - Continue to hold Coreg   for now until fully diuresed. Continue amio gtt + Mexiletine for VT suppression - Continue Farxiga  10 mg daily - Continue Spiro 25 mg daily  - off ARB due to CKD and h/o low BP  - Has failed evaluation for Barostim and CCM -  Currently not candidate for advanced therapies with frailty, severe PAD/open wounds.    3. CAD - h/o remote PCI in 1994 at [facility] - s/p LHC in 2017 in the setting of Vfib Arrest w/ 3V CAD treated w/ CABG x 3 - he denies angina. HS trop trend not c/w ACS  - Continue ASA  - statin intolerant, needs to be consider for PSK9i  - holding ? blocker for now    4. Mechanical Aortic Valve - Stable on last echo  - On coumadin  PTA.  INR pending - If INR < 2.0 will cover w/ heparin  gtt, in the event he may need invasive procedures this admit.  INR 2.7 today.    5. PAD with AAA with nonhealing LE wounds - 6/24 EVAR for AAA requiring bilateral common femoral endarterectomies.  He had to go back on 11/10/2022 for a thrombosed left common femoral artery after endarterectomy. Then went to [facility] for left fem-pop. Course c/b LLE foot ulcer - Continues to struggle with nonhealing wounds. Has been followed at [facility] and here. - he has Vascular surgery consultation at [facility] scheduled next wk  - He had been trying to get in w/ [facility] due to concern for potential need for amputation and effort for limb salvage - Seen by vascular surgery here, plan to get peripheral artery dopplers to assess.    6. IDA - Hgb 9.0>>7.7>>8.2 - Got 2 units PRBCs at hospital in [place].   - Ferritin 79, T sat 12% => got IV Fe.  - FOBT ordered - transfuse for hgb < 7.0   Length of Stay: 2  Ezra Shuck, MD  01/04/2024, 8:46 AM  Advanced Heart Failure Team Pager [phone] (M-F; 7a - 5p)  Please contact [facility] for night-coverage after hours (5p -7a ) and weekends on [email or web address]

## 2024-01-05 DIAGNOSIS — Z9889 Other specified postprocedural states: Secondary | ICD-10-CM

## 2024-01-05 DIAGNOSIS — I70234 Atherosclerosis of native arteries of right leg with ulceration of heel and midfoot: Secondary | ICD-10-CM

## 2024-01-05 DIAGNOSIS — I5043 Acute on chronic combined systolic (congestive) and diastolic (congestive) heart failure: Secondary | ICD-10-CM

## 2024-01-05 DIAGNOSIS — I472 Ventricular tachycardia, unspecified: Secondary | ICD-10-CM | POA: Diagnosis not present

## 2024-01-05 DIAGNOSIS — L97519 Non-pressure chronic ulcer of other part of right foot with unspecified severity: Secondary | ICD-10-CM

## 2024-01-05 DIAGNOSIS — Z95828 Presence of other vascular implants and grafts: Secondary | ICD-10-CM

## 2024-01-05 DIAGNOSIS — Z952 Presence of prosthetic heart valve: Secondary | ICD-10-CM

## 2024-01-05 DIAGNOSIS — I70244 Atherosclerosis of native arteries of left leg with ulceration of heel and midfoot: Secondary | ICD-10-CM

## 2024-01-05 DIAGNOSIS — L97529 Non-pressure chronic ulcer of other part of left foot with unspecified severity: Secondary | ICD-10-CM

## 2024-01-05 DIAGNOSIS — I25810 Atherosclerosis of coronary artery bypass graft(s) without angina pectoris: Secondary | ICD-10-CM

## 2024-01-05 LAB — COOXEMETRY PANEL
Carboxyhemoglobin: 1.6 % — ABNORMAL HIGH (ref 0.5–1.5)
Methemoglobin: <0.7 % (ref 0.0–1.5)
O2 Saturation: 56.3 %
Total hemoglobin: 8.2 g/dL — ABNORMAL LOW (ref 12.0–16.0)

## 2024-01-05 LAB — HEPATIC FUNCTION PANEL
ALT: 61 U/L — ABNORMAL HIGH (ref 0–44)
AST: 50 U/L — ABNORMAL HIGH (ref 15–41)
Albumin: 2.2 g/dL — ABNORMAL LOW (ref 3.5–5.0)
Alkaline Phosphatase: 70 U/L (ref 38–126)
Bilirubin, Direct: 0.5 mg/dL — ABNORMAL HIGH (ref 0.0–0.2)
Indirect Bilirubin: 0.7 mg/dL (ref 0.3–0.9)
Total Bilirubin: 1.2 mg/dL (ref 0.0–1.2)
Total Protein: 6.2 g/dL — ABNORMAL LOW (ref 6.5–8.1)

## 2024-01-05 LAB — PROTIME-INR
INR: 2.3 — ABNORMAL HIGH (ref 0.8–1.2)
Prothrombin Time: 26.6 s — ABNORMAL HIGH (ref 11.4–15.2)

## 2024-01-05 LAB — CBC
HCT: 25.5 % — ABNORMAL LOW (ref 39.0–52.0)
Hemoglobin: 8.3 g/dL — ABNORMAL LOW (ref 13.0–17.0)
MCH: 37.4 pg — ABNORMAL HIGH (ref 26.0–34.0)
MCHC: 32.5 g/dL (ref 30.0–36.0)
MCV: 114.9 fL — ABNORMAL HIGH (ref 80.0–100.0)
Platelets: 405 K/uL — ABNORMAL HIGH (ref 150–400)
RBC: 2.22 MIL/uL — ABNORMAL LOW (ref 4.22–5.81)
RDW: 31 % — ABNORMAL HIGH (ref 11.5–15.5)
WBC: 5.9 K/uL (ref 4.0–10.5)
nRBC: 0 % (ref 0.0–0.2)

## 2024-01-05 LAB — BASIC METABOLIC PANEL WITH GFR
Anion gap: 11 (ref 5–15)
BUN: 39 mg/dL — ABNORMAL HIGH (ref 8–23)
CO2: 25 mmol/L (ref 22–32)
Calcium: 8.8 mg/dL — ABNORMAL LOW (ref 8.9–10.3)
Chloride: 95 mmol/L — ABNORMAL LOW (ref 98–111)
Creatinine, Ser: 1.75 mg/dL — ABNORMAL HIGH (ref 0.61–1.24)
GFR, Estimated: 41 mL/min — ABNORMAL LOW (ref >60)
Glucose, Bld: 116 mg/dL — ABNORMAL HIGH (ref 70–99)
Potassium: 4.5 mmol/L (ref 3.5–5.1)
Sodium: 131 mmol/L — ABNORMAL LOW (ref 135–145)

## 2024-01-05 NOTE — Consult Note (Signed)
 Consult NOTE    Taylor [name] [facility]  FMW:[id number] DOB: Apr 04, 1951 DOA: 01/02/2024 PCP: Joella Sieving, DO  No chief complaint on file.   Brief Narrative:   73 yo with hx CAD s/p CABG, HFrEF (25-30%), s/p AICD, mechanical AV placement on coumadin , essential thrombocytosis, HTN, AAA s/p EVAR 10/2022 with bilateral common femoral endarterectomies c/b L femoral artery thrombosis s/p redo L ileofemoral endarterectomy and profundaplasty with L GSV vein patch presenting as Taylor [name] transfer from [facility] in [place] after being seen for evaluation of ICD shock.    Hospitalists were consulted for medical management.    Assessment & Plan:   Principal Problem:   Ventricular tachyarrhythmia (HCC) Active Problems:   Protein-calorie malnutrition, severe  VT with ICD Shocks Per cards, thought VT triggered by HF exacerbation + hypokalemia Amiodarone , mexiletine Monitor lytes, K>4, mag>2   Acute on Chronic Systolic Heart Failure Diuresis per cards Echo 09/2023 with EF 25-30% Coreg  currently on hold.  Spironolactone , farxiga .    Mechanical Aortic Valve On coumadin , INR 2.3 today Plan for heparin  gtt once INR <2   CKD IIIb  Fluctuating recently, baseline appears to be around 1.5-2 trend  CAD s/p CABG PAD AAA Nonhealing LE Wounds Aspirin , statin intolerant  CTA 10/2022 with occlusion of proximal SFA on L  S/p EVAR for AAA requiring bilateral common femoral endarterectomies Appreciate vascular eval - considering angiogram when stable from cards standpoint - ABI with falsely elevated L ABI, noncompressible RLE arteries. LE arterial US  with 50-74% stenosis in superficial femoral artery (multiple lesions throughout superfiical femoral artery).  LLE with patent L femoral artery to popliteal artery bypass graft with no hemodynamically significant stenosis.  Essential Thrombocytosis  H/o Lacunar Strokes  Hx Seizures Followed by hematology - looks like he follows with [facility] (last note  08/29/2023) Hydroxyurea  on hold with anemia after discussion with pharmacy  Hypothyroidism TSH 17 -> synthroid  increased to 100 mcg, needs repeat labs in 4-6 weeks  Macrocytic Anemia Baseline appears around 9 S/p 2 units pRBC at [facility] (with presenting Hb 7) Continue to monitor   Elevated LFT's Mild, suspect due to congestion with HF above Trend with diuresis  Splenomegaly Noted on US   Hx Etoh Abuse Denied current use    DVT prophylaxis: INR therapeutic, heparin  gtt when INR <2 Code Status: full Family Communication: none Disposition:   Status is: Inpatient Remains inpatient appropriate because: need for continued inpatient care   Consultants:  Hospitalists Cardiology is primary  Procedures:  non  Antimicrobials:  Anti-infectives (From admission, onward)    None       Subjective: No new complaints  Objective: Vitals:   01/05/24 0309 01/05/24 0701 01/05/24 0734 01/05/24 0910  BP: (!) 113/57  110/61   Pulse: 70  69 68  Resp: 16  14 20   Temp: 98.3 F (36.8 C)  97.8 F (36.6 C)   TempSrc: Oral  Oral   SpO2: 98%  92% 92%  Weight:  68.2 kg    Height:        Intake/Output Summary (Last 24 hours) at 01/05/2024 1006 Last data filed at 01/05/2024 0900 Gross per 24 hour  Intake 118 ml  Output 3500 ml  Net -3382 ml   Filed Weights   01/02/24 1306 01/03/24 0447 01/05/24 0701  Weight: 66.1 kg 68.2 kg 68.2 kg    Examination:  General: No acute distress. Cardiovascular: RRR Lungs: unlabored, requiring Bath. Abdomen: protuberant, s/nt Neurological: Alert and oriented 3. Moves all extremities 4 with equal strength. Cranial  nerves II through XII grossly intact. Extremities: boots bilaterally, chronic LE wounds    Data Reviewed: I have personally reviewed following labs and imaging studies  CBC: Recent Labs  Lab 01/02/24 1211 01/03/24 0500 01/03/24 1522 01/04/24 0420 01/05/24 0315  WBC 4.3 3.7*  --  5.6 5.9  NEUTROABS  --  2.9  --   --   --    HGB 9.0* 7.7* 9.3* 8.2* 8.3*  HCT 27.5* 23.7* 28.2* 25.1* 25.5*  MCV 113.2* 113.4*  --  114.1* 114.9*  PLT 435* 372  --  389 405*    Basic Metabolic Panel: Recent Labs  Lab 01/02/24 1211 01/03/24 0500 01/04/24 0420 01/04/24 1300 01/05/24 0315  NA 133* 131* 135 133* 131*  K 2.7* 3.5 4.5 4.2 4.5  CL 100 98 98 96* 95*  CO2 21* 25 24 26 25   GLUCOSE 127* 109* 126* 116* 116*  BUN 41* 37* 39* 38* 39*  CREATININE 1.51* 1.56* 1.67* 1.64* 1.75*  CALCIUM  9.0 8.3* 8.6* 8.8* 8.8*  MG 1.9 2.1  --   --   --   PHOS  --  3.5  --   --   --     GFR: Estimated Creatinine Clearance: 36.3 mL/min ([place]) (by C-G formula based on SCr of 1.75 mg/dL (H)).  Liver Function Tests: Recent Labs  Lab 01/02/24 1211 01/03/24 0500 01/05/24 0315  AST 56* 35 50*  ALT 88* 62* 61*  ALKPHOS 70 58 70  BILITOT 1.3* 0.9 1.2  PROT 6.8 5.8* 6.2*  ALBUMIN  2.5* 2.0* 2.2*    CBG: No results for input(s): GLUCAP in the last 168 hours.   No results found for this or any previous visit (from the past 240 hours).       Radiology Studies: VAS US  LOWER EXTREMITY ARTERIAL DUPLEX Result Date: 01/04/2024 LOWER EXTREMITY ARTERIAL DUPLEX STUDY Patient Name:  Taylor [name]  Date of Exam:   01/04/2024 Medical Rec #: [id number]              Accession #:    [id number] Date of Birth: [birth year]              Patient Gender: M Patient Age:   69 years Exam Location:  [facility] Procedure:      VAS US  LOWER EXTREMITY ARTERIAL DUPLEX Referring Phys: DEBBY ROBERTSON --------------------------------------------------------------------------------  Indications: Rest pain. High Risk Factors: Hypertension, past history of smoking.  Vascular Interventions: Multiple interventions including EVAR and left fem-pop                         art BPG. Current ABI:            R- N/C, L-1.42 Limitations: pt unable to tolerate any pressure near feet due to heel ulcer. Performing Technologist: Elmarie Lindau, RVT  Examination Guidelines: Taleeya Blondin  complete evaluation includes B-mode imaging, spectral Doppler, color Doppler, and power Doppler as needed of all accessible portions of each vessel. Bilateral testing is considered an integral part of Fantasy Donald complete examination. Limited examinations for reoccurring indications may be performed as noted.  +----------+--------+-----+---------------+-----------+--------+ RIGHT     PSV cm/sRatioStenosis       Waveform   Comments +----------+--------+-----+---------------+-----------+--------+ CFA Prox  102                         multiphasic         +----------+--------+-----+---------------+-----------+--------+ DFA       0                                               +----------+--------+-----+---------------+-----------+--------+  SFA Prox  260          50-74% stenosismultiphasic         +----------+--------+-----+---------------+-----------+--------+ SFA Mid   99                          multiphasic         +----------+--------+-----+---------------+-----------+--------+ SFA Distal321          50-74% stenosismultiphasic         +----------+--------+-----+---------------+-----------+--------+ POP Prox  52                          monophasic          +----------+--------+-----+---------------+-----------+--------+ POP Distal47                          monophasic          +----------+--------+-----+---------------+-----------+--------+ ATA Distal79                          monophasic          +----------+--------+-----+---------------+-----------+--------+ PTA Distal74                          monophasic          +----------+--------+-----+---------------+-----------+--------+  Left Graft #1: Left Femoral Artery to Popliteal Artery Bypass Graft +--------------------+--------+--------+---------+--------+                     PSV cm/sStenosisWaveform Comments +--------------------+--------+--------+---------+--------+ Inflow              182              triphasic         +--------------------+--------+--------+---------+--------+ Proximal Anastomosis112             triphasic         +--------------------+--------+--------+---------+--------+ Proximal Graft      92              triphasic         +--------------------+--------+--------+---------+--------+ Mid Graft           81              triphasic         +--------------------+--------+--------+---------+--------+ Distal Graft        69              triphasic         +--------------------+--------+--------+---------+--------+ Distal Anastomosis  70              triphasic         +--------------------+--------+--------+---------+--------+ Outflow             167             triphasic         +--------------------+--------+--------+---------+--------+   Summary: Right: 50-74% stenosis noted in the superficial femoral artery. There are multiple lesions throughout the superficial femoral artery. Left: Patent left femoral artery to popliteal artery bypass graft with no hemodynamically significant stenosis.  See table(s) above for measurements and observations. Electronically signed by Norman Serve on 01/04/2024 at 4:38:01 PM.    Final    VAS US  ABI WITH/WO TBI Result Date: 01/04/2024  LOWER EXTREMITY DOPPLER STUDY Patient Name:  Garrette Caine [facility]  Date of Exam:   01/04/2024 Medical Rec #: [id number]  Accession #:    [id number] Date of Birth: September 16, 1950              Patient Gender: M Patient Age:   50 years Exam Location:  [facility] Procedure:      VAS US  ABI WITH/WO TBI Referring Phys: DEBBY ROBERTSON --------------------------------------------------------------------------------  Indications: Rest pain. High Risk Factors: Hypertension, past history of smoking.  Vascular Interventions: Multiple interventions including EVAR and left Fem-Pop                         art BPG. Limitations: Today's exam was limited due to patient positioning and patient               intolerant to cuff pressure. Comparison Study: 10/09/2022 Falsely elevated ABI bilaterally. Performing Technologist: Jimmye Scarce RVT  Examination Guidelines: Suhaan Perleberg complete evaluation includes at minimum, Doppler waveform signals and systolic blood pressure reading at the level of bilateral brachial, anterior tibial, and posterior tibial arteries, when vessel segments are accessible. Bilateral testing is considered an integral part of Heru Montz complete examination. Photoelectric Plethysmograph (PPG) waveforms and toe systolic pressure readings are included as required and additional duplex testing as needed. Limited examinations for reoccurring indications may be performed as noted.  ABI Findings: +--------+------------------+-----+----------+--------+ Right   Rt Pressure (mmHg)IndexWaveform  Comment  +--------+------------------+-----+----------+--------+ Brachial                                 IV       +--------+------------------+-----+----------+--------+ PTA     197               1.71 monophasic         +--------+------------------+-----+----------+--------+ DP      254               2.21 monophasic         +--------+------------------+-----+----------+--------+ +--------+------------------+-----+-----------+-------+ Left    Lt Pressure (mmHg)IndexWaveform   Comment +--------+------------------+-----+-----------+-------+ [id number]                                       +--------+------------------+-----+-----------+-------+ PTA     127               1.10 multiphasic        +--------+------------------+-----+-----------+-------+ DP      163               1.42 triphasic          +--------+------------------+-----+-----------+-------+ +-------+-----------+-----------+------------+------------+ ABI/TBIToday's ABIToday's TBIPrevious ABIPrevious TBI +-------+-----------+-----------+------------+------------+ Right  N/C                                             +-------+-----------+-----------+------------+------------+ Left   1.42                                           +-------+-----------+-----------+------------+------------+  Left ABI is falsely elevated.  Summary: Right: Resting right ankle-brachial index indicates noncompressible right lower extremity arteries. Left: Left ankle brachial index is falsely elevated. *See table(s) above for measurements and observations.  Electronically signed by Norman Serve on 01/04/2024 at 4:37:41 PM.  Final         Scheduled Meds:  amiodarone   200 mg Oral BID   aspirin  EC  81 mg Oral Daily   Chlorhexidine  Gluconate Cloth  6 each Topical Daily   cholecalciferol   2,000 Units Oral Daily   dapagliflozin  propanediol  10 mg Oral Daily   furosemide   80 mg Intravenous BID   Gerhardt's butt cream   Topical BID   levothyroxine   100 mcg Oral Q0600   mexiletine  200 mg Oral Q12H   pantoprazole   40 mg Oral BID   potassium chloride   40 mEq Oral Daily   sodium chloride  flush  10-40 mL Intracatheter Q12H   sodium chloride  flush  3 mL Intravenous Q12H   spironolactone   25 mg Oral Daily   thiamine   100 mg Oral Daily   traZODone   50 mg Oral QHS   Continuous Infusions:     LOS: 3 days    Time spent: over 30 min     Meliton Monte, MD [facility]   To contact the attending provider between 7A-7P or the covering provider during after hours 7P-7A, please log into the web site [email or web address] and access using universal [place] password for that web site. If you do not have the password, please call the hospital operator.  01/05/2024, 10:06 AM

## 2024-01-05 NOTE — Progress Notes (Signed)
 PHARMACY - ANTICOAGULATION CONSULT NOTE  Pharmacy Consult for heparin  Indication: AVR  Allergies  Allergen Reactions   Erythromycin Rash   Zithromax [Azithromycin] Rash    Patient Measurements: Height: 5' 9 (175.3 cm) Weight: 68.2 kg (150 lb 5.7 oz) IBW/kg (Calculated) : 70.7 HEPARIN  DW (KG): 66.1  Vital Signs: Temp: 98.8 F (37.1 C) (08/09 1150) Temp Source: Oral (08/09 1150) BP: 108/58 (08/09 1150) Pulse Rate: 69 (08/09 1150)  Labs: Recent Labs    01/03/24 0500 01/03/24 0515 01/03/24 1522 01/04/24 0420 01/04/24 1300 01/05/24 0315  HGB 7.7*  --  9.3* 8.2*  --  8.3*  HCT 23.7*  --  28.2* 25.1*  --  25.5*  PLT 372  --   --  389  --  405*  LABPROT  --  30.2*  --  30.1*  --  26.6*  INR  --  2.7*  --  2.7*  --  2.3*  CREATININE 1.56*  --   --  1.67* 1.64* 1.75*    Estimated Creatinine Clearance: 36.3 mL/min (A) (by C-G formula based on SCr of 1.75 mg/dL (H)).   Medical History: Past Medical History:  Diagnosis Date   Abdominal aortic aneurysm (AAA) (HCC)    AICD (automatic cardioverter/defibrillator) present    Atherosclerotic heart disease of native coronary artery without angina pectoris    Chronic anticoagulation 02/18/2018   Chronic systolic heart failure (HCC) 02/02/2017   Essential thrombocythemia (HCC) 02/15/2017   Hyperlipidemia    Hypertension    Hypothyroidism    Ischemic cardiomyopathy    Leg pain, central, left 04/17/2017   Hx sciatica - pain similar; no red flag signs on hx or exam    Presence of other heart-valve replacement    Presence of permanent cardiac pacemaker    Thrombocytosis    pt states he takes a chemo pill for this   Ventricular tachycardia (HCC)     Medications:  Medications Prior to Admission  Medication Sig Dispense Refill Last Dose/Taking   amiodarone  (PACERONE ) 200 MG tablet TAKE 0.5 TABLET BY MOUTH EVERY MORNING AND 1 TABLET EVERY EVENING (Patient taking differently: Take 100-200 mg by mouth 2 (two) times daily. TAKE  0.5 TABLET BY MOUTH EVERY MORNING AND 1 TABLET EVERY EVENING) 135 tablet 3 Past Week   Ascorbic Acid (CVS VITAMIN C PO) Take 1 tablet by mouth daily.   Past Week   aspirin  81 MG tablet Take 81 mg by mouth daily.   Past Week   carvedilol  (COREG ) 3.125 MG tablet Take 1 tablet (3.125 mg total) by mouth 2 (two) times daily with a meal. 180 tablet 3 Past Week   Cholecalciferol  (VITAMIN D ) 50 MCG (2000 UT) tablet Take 2,000 Units by mouth daily.   Past Week   FARXIGA  10 MG TABS tablet TAKE 1 TABLET(10 MG) BY MOUTH DAILY (Patient taking differently: Take 10 mg by mouth daily.) 90 tablet 3 Past Week   furosemide  (LASIX ) 20 MG tablet Take 2 tablets (40 mg total) by mouth daily. 60 tablet 11 Past Week   hydroxyurea  (HYDREA ) 500 MG capsule Take 3 tablet (1500 mg )by mouth daily on Mon,Wed,Friday and 2 tabs (1000 mg) on Tues, Thursday, Sat and Sunday (Patient taking differently: See admin instructions. MWF one tab in am 2 in evening. One am evening every other day) 180 capsule 4 Past Week   ibuprofen (ADVIL) 800 MG tablet Take 800 mg by mouth daily at 12 noon.   Past Week   levothyroxine  (SYNTHROID ) 75 MCG tablet  Take 75 mcg by mouth daily before breakfast.   Past Week   potassium chloride  (KLOR-CON  M) 10 MEQ tablet Take 4 tablets (40 mEq total) by mouth daily. (Patient taking differently: Take 20 mEq by mouth daily.) 120 tablet 11 Past Week   spironolactone  (ALDACTONE ) 25 MG tablet Take 1 tablet (25 mg total) by mouth every other day. 45 tablet 3 Past Week   traZODone  (DESYREL ) 50 MG tablet Take 50 mg by mouth at bedtime.   Past Week   vitamin B-12 (CYANOCOBALAMIN ) 1000 MCG tablet Take 1,000 mcg by mouth daily.   Past Week   warfarin (COUMADIN ) 2 MG tablet Take 1 tablet (2 mg total) by mouth daily. (Patient taking differently: Take 2 mg by mouth daily. As directed by MD) 100 tablet 1 01/01/2024   warfarin (COUMADIN ) 5 MG tablet TAKE 1/2 TABLET BY MOUTH DAILY OR AS DIRECTED BY COUMADIN  CLINIC. 50 tablet 1 Past Week    Scheduled:   amiodarone   200 mg Oral BID   aspirin  EC  81 mg Oral Daily   Chlorhexidine  Gluconate Cloth  6 each Topical Daily   cholecalciferol   2,000 Units Oral Daily   dapagliflozin  propanediol  10 mg Oral Daily   furosemide   80 mg Intravenous BID   Gerhardt's butt cream   Topical BID   levothyroxine   100 mcg Oral Q0600   mexiletine  200 mg Oral Q12H   pantoprazole   40 mg Oral BID   potassium chloride   40 mEq Oral Daily   sodium chloride  flush  10-40 mL Intracatheter Q12H   sodium chloride  flush  3 mL Intravenous Q12H   spironolactone   25 mg Oral Daily   thiamine   100 mg Oral Daily   traZODone   50 mg Oral QHS   Infusions:     Assessment: Pt presenting after ICD shock. Has been on warfarin for mechAVR, also has hx Afib. Plan for heparin  order for bridging when INR <2.   PTA regimen as of 12/19/2023: 1 mg daily except 2 mg MWF.   INR today 2.3. Hgb 8.3, plt 405  Goal of Therapy:  INR 2-3 as per outpatient visit  Heparin  level 0.3-0.7 units/ml Monitor platelets by anticoagulation protocol: Yes   Plan:  Plan for heparin  infusion when INR <2 Monitor daily CBC, INR, and for s/sx of bleeding   Thank you for allowing pharmacy to be a part of this patient's care.  Izetta Carl, PharmD PGY1 Pharmacy Resident [facility]  01/05/2024 12:14 PM

## 2024-01-05 NOTE — Progress Notes (Signed)
 Progress Note  Patient Name: Taylor [name] [facility] Date of Encounter: 01/05/2024  Primary Cardiologist: None  Subjective   No symptoms except for pain in both legs.  CVP today is 11.  Yesterday was 16.  Inpatient Medications    Scheduled Meds:  amiodarone   200 mg Oral BID   aspirin  EC  81 mg Oral Daily   Chlorhexidine  Gluconate Cloth  6 each Topical Daily   cholecalciferol   2,000 Units Oral Daily   dapagliflozin  propanediol  10 mg Oral Daily   furosemide   80 mg Intravenous BID   Gerhardt's butt cream   Topical BID   levothyroxine   100 mcg Oral Q0600   mexiletine  200 mg Oral Q12H   pantoprazole   40 mg Oral BID   potassium chloride   40 mEq Oral Daily   sodium chloride  flush  10-40 mL Intracatheter Q12H   sodium chloride  flush  3 mL Intravenous Q12H   spironolactone   25 mg Oral Daily   thiamine   100 mg Oral Daily   traZODone   50 mg Oral QHS   Continuous Infusions:  PRN Meds: acetaminophen , ondansetron  (ZOFRAN ) IV, oxyCODONE , sodium chloride  flush, sodium chloride  flush, traMADol    Vital Signs    Vitals:   01/05/24 0309 01/05/24 0701 01/05/24 0734 01/05/24 0910  BP: (!) 113/57  110/61   Pulse: 70  69 68  Resp: 16  14 20   Temp: 98.3 F (36.8 C)  97.8 F (36.6 C)   TempSrc: Oral  Oral   SpO2: 98%  92% 92%  Weight:  68.2 kg    Height:        Intake/Output Summary (Last 24 hours) at 01/05/2024 1021 Last data filed at 01/05/2024 0900 Gross per 24 hour  Intake 118 ml  Output 3500 ml  Net -3382 ml   Filed Weights   01/02/24 1306 01/03/24 0447 01/05/24 0701  Weight: 66.1 kg 68.2 kg 68.2 kg    Telemetry    Personally reviewed.  Paced rhythm.  ECG    Not performed today.  Physical Exam   GEN: No acute distress.   Neck:  JVD+ Cardiac: RRR, no murmur, rub, or gallop.  Respiratory: Nonlabored. Clear to auscultation bilaterally. GI: Soft, nontender, bowel sounds present. MS: No edema; wounds on bilateral lower extremities Neuro:  Nonfocal. Psych: Alert  and oriented x 3. Normal affect.  Labs    Chemistry Recent Labs  Lab 01/02/24 1211 01/03/24 0500 01/04/24 0420 01/04/24 1300 01/05/24 0315  NA 133* 131* 135 133* 131*  K 2.7* 3.5 4.5 4.2 4.5  CL 100 98 98 96* 95*  CO2 21* 25 24 26 25   GLUCOSE 127* 109* 126* 116* 116*  BUN 41* 37* 39* 38* 39*  CREATININE 1.51* 1.56* 1.67* 1.64* 1.75*  CALCIUM  9.0 8.3* 8.6* 8.8* 8.8*  PROT 6.8 5.8*  --   --  6.2*  ALBUMIN  2.5* 2.0*  --   --  2.2*  AST 56* 35  --   --  50*  ALT 88* 62*  --   --  61*  ALKPHOS 70 58  --   --  70  BILITOT 1.3* 0.9  --   --  1.2  GFRNONAA 48* 47* 43* 44* 41*  ANIONGAP 12 8 13 11 11      Hematology Recent Labs  Lab 01/03/24 0500 01/03/24 1522 01/04/24 0420 01/05/24 0315  WBC 3.7*  --  5.6 5.9  RBC 2.09*  --  2.20* 2.22*  HGB 7.7* 9.3* 8.2* 8.3*  HCT  23.7* 28.2* 25.1* 25.5*  MCV 113.4*  --  114.1* 114.9*  MCH 36.8*  --  37.3* 37.4*  MCHC 32.5  --  32.7 32.5  RDW 32.3*  --  31.8* 31.0*  PLT 372  --  389 405*    Cardiac Enzymes Recent Labs  Lab 01/02/24 1211  TROPONINIHS 78*    BNPNo results for input(s): BNP, PROBNP in the last 168 hours.   DDimerNo results for input(s): DDIMER in the last 168 hours.    Assessment & Plan   Acute on chronic systolic heart failure - CVP 16 yesterday, CVP 11 today. - Continue IV Lasix  80 mg twice daily, anticipate switching to p.o. tomorrow. - Holding Coreg  until completely diuresed. - Off ARB due to CKD, low BP. - Continue spironolactone  25 mg once daily. - Continue Farxiga  10 mg once daily. - Not a candidate for advanced heart failure therapies with frailty, severe PAD/open wounds.  VT S/p CRT-D - Continue amiodarone  200 mg twice daily. - Continue mexiletine 200 mg twice daily. - Telemetry reviewed, no NSVT or sustained V. tach present. - Keep K>4, <5, Mg>2, <3  CAD s/p PCI and CABG  - Continue aspirin  81 mg once daily. - Statin intolerant, PCSK9 inhibitors.  Outpatient.  Mechanical aortic  valve - On Coumadin  PTA. - INR 2.7.  Keep an INR is less than 2, will start heparin  drip.  PAD - He has bilateral nonhealing wounds. - Vascular surgery on board.    Signed, Diannah SHAUNNA Maywood, MD  01/05/2024, 10:21 AM

## 2024-01-05 NOTE — Progress Notes (Signed)
  Daily Progress Note   Subjective: C/o of pain in b/l legs  Objective: Vitals:   01/05/24 0734 01/05/24 0910  BP: 110/61   Pulse: 69 68  Resp: 14 20  Temp: 97.8 F (36.6 C)   SpO2: 92% 92%    Physical Examination HDS3 Nonlabored breathing Bilateral medial ulcers present, right dorsal foot ulcer  ASSESSMENT/PLAN:  73 year old male with  CL TI with bilateral rest pain and ulceration.  He has had a previous EVAR with bilateral femoral endarterectomy.  His cardiac status severely limits options for intervention but we will tentatively plan for angiogram from the left brachial approach on Monday.   Norman GORMAN Serve MD [facility] [phone] 01/05/2024  10:41 AM

## 2024-01-05 NOTE — Plan of Care (Signed)

## 2024-01-05 NOTE — Plan of Care (Signed)

## 2024-01-06 ENCOUNTER — Other Ambulatory Visit: Payer: Self-pay

## 2024-01-06 ENCOUNTER — Encounter (HOSPITAL_COMMUNITY): Payer: Self-pay | Admitting: Cardiology

## 2024-01-06 DIAGNOSIS — N179 Acute kidney failure, unspecified: Secondary | ICD-10-CM

## 2024-01-06 DIAGNOSIS — I70293 Other atherosclerosis of native arteries of extremities, bilateral legs: Secondary | ICD-10-CM

## 2024-01-06 DIAGNOSIS — Z95810 Presence of automatic (implantable) cardiac defibrillator: Secondary | ICD-10-CM | POA: Diagnosis not present

## 2024-01-06 DIAGNOSIS — N189 Chronic kidney disease, unspecified: Secondary | ICD-10-CM

## 2024-01-06 DIAGNOSIS — I739 Peripheral vascular disease, unspecified: Secondary | ICD-10-CM | POA: Diagnosis not present

## 2024-01-06 DIAGNOSIS — I5043 Acute on chronic combined systolic (congestive) and diastolic (congestive) heart failure: Secondary | ICD-10-CM | POA: Diagnosis not present

## 2024-01-06 DIAGNOSIS — I70223 Atherosclerosis of native arteries of extremities with rest pain, bilateral legs: Secondary | ICD-10-CM

## 2024-01-06 DIAGNOSIS — I472 Ventricular tachycardia, unspecified: Secondary | ICD-10-CM | POA: Diagnosis not present

## 2024-01-06 LAB — PROTIME-INR
INR: 2.1 — ABNORMAL HIGH (ref 0.8–1.2)
INR: 2.1 — ABNORMAL HIGH (ref 0.8–1.2)
Prothrombin Time: 24.9 s — ABNORMAL HIGH (ref 11.4–15.2)
Prothrombin Time: 24.5 s — ABNORMAL HIGH (ref 11.4–15.2)

## 2024-01-06 LAB — BASIC METABOLIC PANEL WITH GFR
Anion gap: 14 (ref 5–15)
BUN: 49 mg/dL — ABNORMAL HIGH (ref 8–23)
CO2: 29 mmol/L (ref 22–32)
Calcium: 8.6 mg/dL — ABNORMAL LOW (ref 8.9–10.3)
Chloride: 90 mmol/L — ABNORMAL LOW (ref 98–111)
Creatinine, Ser: 1.69 mg/dL — ABNORMAL HIGH (ref 0.61–1.24)
GFR, Estimated: 42 mL/min — ABNORMAL LOW (ref >60)
Glucose, Bld: 112 mg/dL — ABNORMAL HIGH (ref 70–99)
Potassium: 3.8 mmol/L (ref 3.5–5.1)
Sodium: 133 mmol/L — ABNORMAL LOW (ref 135–145)

## 2024-01-06 LAB — CBC
HCT: 25.4 % — ABNORMAL LOW (ref 39.0–52.0)
Hemoglobin: 8.2 g/dL — ABNORMAL LOW (ref 13.0–17.0)
MCH: 36.9 pg — ABNORMAL HIGH (ref 26.0–34.0)
MCHC: 32.3 g/dL (ref 30.0–36.0)
MCV: 114.4 fL — ABNORMAL HIGH (ref 80.0–100.0)
Platelets: 442 K/uL — ABNORMAL HIGH (ref 150–400)
RBC: 2.22 MIL/uL — ABNORMAL LOW (ref 4.22–5.81)
RDW: 29.9 % — ABNORMAL HIGH (ref 11.5–15.5)
WBC: 6.8 K/uL (ref 4.0–10.5)
nRBC: 0 % (ref 0.0–0.2)

## 2024-01-06 LAB — COOXEMETRY PANEL
Carboxyhemoglobin: 2.2 % — ABNORMAL HIGH (ref 0.5–1.5)
Methemoglobin: <0.7 % (ref 0.0–1.5)
O2 Saturation: 48.9 %
Total hemoglobin: 12.3 g/dL (ref 12.0–16.0)

## 2024-01-06 LAB — COPPER, SERUM: Copper: 170 ug/dL — ABNORMAL HIGH (ref 69–132)

## 2024-01-06 LAB — ZINC: Zinc: 37 ug/dL — ABNORMAL LOW (ref 44–115)

## 2024-01-06 MED ORDER — HYDROXYUREA 500 MG PO CAPS
1000.0000 mg | ORAL_CAPSULE | ORAL | Status: DC
  Administered 2024-01-07 – 2024-01-09 (×4): 1000 mg via ORAL
  Filled 2024-01-06 (×2): qty 2

## 2024-01-06 MED ORDER — HEPARIN (PORCINE) 25000 UT/250ML-% IV SOLN
900.0000 [IU]/h | INTRAVENOUS | Status: DC
  Administered 2024-01-06: 700 [IU]/h via INTRAVENOUS
  Filled 2024-01-06: qty 250

## 2024-01-06 MED ORDER — HYDROXYUREA 500 MG PO CAPS
500.0000 mg | ORAL_CAPSULE | Freq: Every day | ORAL | Status: DC
  Administered 2024-01-06 – 2024-01-11 (×9): 500 mg via ORAL
  Filled 2024-01-06 (×7): qty 1

## 2024-01-06 NOTE — Progress Notes (Signed)
 Progress Note  Patient Name: Taylor [name] [facility] Date of Encounter: 01/06/2024  Primary Cardiologist: None  Subjective   No symptoms except for pain in both legs.  CVP yesterday and today is 11.  Inpatient Medications    Scheduled Meds:  amiodarone   200 mg Oral BID   aspirin  EC  81 mg Oral Daily   Chlorhexidine  Gluconate Cloth  6 each Topical Daily   cholecalciferol   2,000 Units Oral Daily   dapagliflozin  propanediol  10 mg Oral Daily   furosemide   80 mg Intravenous BID   Gerhardt's butt cream   Topical BID   hydroxyurea   500 mg Oral Daily   And   [START ON 01/07/2024] hydroxyurea   1,000 mg Oral Once per day on Monday Wednesday Friday   levothyroxine   100 mcg Oral Q0600   mexiletine  200 mg Oral Q12H   pantoprazole   40 mg Oral BID   potassium chloride   40 mEq Oral Daily   sodium chloride  flush  10-40 mL Intracatheter Q12H   sodium chloride  flush  3 mL Intravenous Q12H   spironolactone   25 mg Oral Daily   thiamine   100 mg Oral Daily   traZODone   50 mg Oral QHS   Continuous Infusions:  PRN Meds: acetaminophen , ondansetron  (ZOFRAN ) IV, oxyCODONE , sodium chloride  flush, sodium chloride  flush, traMADol    Vital Signs    Vitals:   01/06/24 0233 01/06/24 0405 01/06/24 0500 01/06/24 0731  BP: 123/67 111/69  120/63  Pulse: 70 88  70  Resp: 20 18  19   Temp: 98.2 F (36.8 C) 98.7 F (37.1 C)  98.1 F (36.7 C)  TempSrc: Oral Oral  Oral  SpO2: 100% 100%  94%  Weight:   62.6 kg   Height:        Intake/Output Summary (Last 24 hours) at 01/06/2024 1123 Last data filed at 01/06/2024 0733 Gross per 24 hour  Intake 358 ml  Output 3300 ml  Net -2942 ml   Filed Weights   01/03/24 0447 01/05/24 0701 01/06/24 0500  Weight: 68.2 kg 68.2 kg 62.6 kg    Telemetry    Personally reviewed.  Paced rhythm.  ECG    Not performed today.  Physical Exam   GEN: No acute distress.   Neck:  JVD+ Cardiac: RRR, no murmur, rub, or gallop.  Respiratory: Nonlabored. Clear to  auscultation bilaterally. GI: Soft, nontender, bowel sounds present. MS: No edema; wounds on bilateral lower extremities Neuro:  Nonfocal. Psych: Alert and oriented x 3. Normal affect.  Labs    Chemistry Recent Labs  Lab 01/02/24 1211 01/03/24 0500 01/04/24 0420 01/04/24 1300 01/05/24 0315 01/06/24 0500  NA 133* 131*   < > 133* 131* 133*  K 2.7* 3.5   < > 4.2 4.5 3.8  CL 100 98   < > 96* 95* 90*  CO2 21* 25   < > 26 25 29   GLUCOSE 127* 109*   < > 116* 116* 112*  BUN 41* 37*   < > 38* 39* 49*  CREATININE 1.51* 1.56*   < > 1.64* 1.75* 1.69*  CALCIUM  9.0 8.3*   < > 8.8* 8.8* 8.6*  PROT 6.8 5.8*  --   --  6.2*  --   ALBUMIN  2.5* 2.0*  --   --  2.2*  --   AST 56* 35  --   --  50*  --   ALT 88* 62*  --   --  61*  --   ALKPHOS 70  58  --   --  70  --   BILITOT 1.3* 0.9  --   --  1.2  --   GFRNONAA 48* 47*   < > 44* 41* 42*  ANIONGAP 12 8   < > 11 11 14    < > = values in this interval not displayed.     Hematology Recent Labs  Lab 01/04/24 0420 01/05/24 0315 01/06/24 0500  WBC 5.6 5.9 6.8  RBC 2.20* 2.22* 2.22*  HGB 8.2* 8.3* 8.2*  HCT 25.1* 25.5* 25.4*  MCV 114.1* 114.9* 114.4*  MCH 37.3* 37.4* 36.9*  MCHC 32.7 32.5 32.3  RDW 31.8* 31.0* 29.9*  PLT 389 405* 442*    Cardiac Enzymes Recent Labs  Lab 01/02/24 1211  TROPONINIHS 78*    BNPNo results for input(s): BNP, PROBNP in the last 168 hours.   DDimerNo results for input(s): DDIMER in the last 168 hours.    Assessment & Plan   Acute on chronic systolic heart failure - CVP 11 today and yesterday. It improved from 16. - Continue IV Lasix  80 mg twice daily. - Holding Coreg  until completely diuresed. - Off ARB due to CKD, low BP. - Continue spironolactone  25 mg once daily. - Continue Farxiga  10 mg once daily. - Not a candidate for advanced heart failure therapies with frailty, severe PAD/open wounds.  VT S/p CRT-D - Continue amiodarone  200 mg twice daily. - Continue mexiletine 200 mg twice  daily. - Telemetry reviewed, no NSVT or sustained V. tach present. - Keep K>4, <5, Mg>2, <3  CAD s/p PCI and CABG  - Continue aspirin  81 mg once daily. - Statin intolerant, PCSK9 inhibitors.  Outpatient.  Mechanical aortic valve - On Coumadin  PTA. - INR 2.7.  Keep an INR is less than 2, will start heparin  drip.  PAD - He has bilateral nonhealing wounds. - Vascular surgery on board.    Signed, Diannah SHAUNNA Maywood, MD  01/06/2024, 11:23 AM

## 2024-01-06 NOTE — Progress Notes (Signed)
 PHARMACY - ANTICOAGULATION CONSULT NOTE  Pharmacy Consult for heparin  Indication: AVR  Allergies  Allergen Reactions   Erythromycin Rash   Zithromax [Azithromycin] Rash    Patient Measurements: Height: 5' 9 (175.3 cm) Weight: 62.6 kg (138 lb) IBW/kg (Calculated) : 70.7 HEPARIN  DW (KG): 66.1  Vital Signs: Temp: 98 F (36.7 C) (08/10 1642) Temp Source: Oral (08/10 1642) BP: 110/63 (08/10 1642) Pulse Rate: 70 (08/10 1642)  Labs: Recent Labs    01/04/24 0420 01/04/24 1300 01/05/24 0315 01/06/24 0500 01/06/24 1600  HGB 8.2*  --  8.3* 8.2*  --   HCT 25.1*  --  25.5* 25.4*  --   PLT 389  --  405* 442*  --   LABPROT 30.1*  --  26.6* 24.9* 24.5*  INR 2.7*  --  2.3* 2.1* 2.1*  CREATININE 1.67* 1.64* 1.75* 1.69*  --     Estimated Creatinine Clearance: 34.5 mL/min (A) (by C-G formula based on SCr of 1.69 mg/dL (H)).   Medical History: Past Medical History:  Diagnosis Date   Abdominal aortic aneurysm (AAA) (HCC)    AICD (automatic cardioverter/defibrillator) present    Atherosclerotic heart disease of native coronary artery without angina pectoris    Chronic anticoagulation 02/18/2018   Chronic systolic heart failure (HCC) 02/02/2017   Essential thrombocythemia (HCC) 02/15/2017   Hyperlipidemia    Hypertension    Hypothyroidism    Ischemic cardiomyopathy    Leg pain, central, left 04/17/2017   Hx sciatica - pain similar; no red flag signs on hx or exam    Presence of other heart-valve replacement    Presence of permanent cardiac pacemaker    Thrombocytosis    pt states he takes a chemo pill for this   Ventricular tachycardia (HCC)     Medications:  Medications Prior to Admission  Medication Sig Dispense Refill Last Dose/Taking   amiodarone  (PACERONE ) 200 MG tablet TAKE 0.5 TABLET BY MOUTH EVERY MORNING AND 1 TABLET EVERY EVENING (Patient taking differently: Take 100-200 mg by mouth 2 (two) times daily. TAKE 0.5 TABLET BY MOUTH EVERY MORNING AND 1 TABLET EVERY  EVENING) 135 tablet 3 Past Week   Ascorbic Acid ([employer]S VITAMIN C PO) Take 1 tablet by mouth daily.   Past Week   aspirin  81 MG tablet Take 81 mg by mouth daily.   Past Week   carvedilol  (COREG ) 3.125 MG tablet Take 1 tablet (3.125 mg total) by mouth 2 (two) times daily with a meal. 180 tablet 3 Past Week   Cholecalciferol  (VITAMIN D ) 50 MCG (2000 UT) tablet Take 2,000 Units by mouth daily.   Past Week   FARXIGA  10 MG TABS tablet TAKE 1 TABLET(10 MG) BY MOUTH DAILY (Patient taking differently: Take 10 mg by mouth daily.) 90 tablet 3 Past Week   furosemide  (LASIX ) 20 MG tablet Take 2 tablets (40 mg total) by mouth daily. 60 tablet 11 Past Week   hydroxyurea  (HYDREA ) 500 MG capsule Take 3 tablet (1500 mg )by mouth daily on Mon,Wed,Friday and 2 tabs (1000 mg) on Tues, Thursday, Sat and Sunday (Patient taking differently: See admin instructions. Patient takes 500 mg daily. On MWF, patient also takes 1000 mg in the evening for 1500 mg total.) 180 capsule 4 Past Week   ibuprofen (ADVIL) 800 MG tablet Take 800 mg by mouth daily at 12 noon.   Past Week   levothyroxine  (SYNTHROID ) 75 MCG tablet Take 75 mcg by mouth daily before breakfast.   Past Week   potassium chloride  (KLOR-CON   M) 10 MEQ tablet Take 4 tablets (40 mEq total) by mouth daily. (Patient taking differently: Take 20 mEq by mouth daily.) 120 tablet 11 Past Week   spironolactone  (ALDACTONE ) 25 MG tablet Take 1 tablet (25 mg total) by mouth every other day. 45 tablet 3 Past Week   traZODone  (DESYREL ) 50 MG tablet Take 50 mg by mouth at bedtime.   Past Week   vitamin B-12 (CYANOCOBALAMIN ) 1000 MCG tablet Take 1,000 mcg by mouth daily.   Past Week   warfarin (COUMADIN ) 2 MG tablet Take 1 tablet (2 mg total) by mouth daily. (Patient taking differently: Take 2 mg by mouth daily. As directed by MD) 100 tablet 1 01/01/2024   warfarin (COUMADIN ) 5 MG tablet TAKE 1/2 TABLET BY MOUTH DAILY OR AS DIRECTED BY COUMADIN  CLINIC. 50 tablet 1 Past Week   Scheduled:    amiodarone   200 mg Oral BID   aspirin  EC  81 mg Oral Daily   Chlorhexidine  Gluconate Cloth  6 each Topical Daily   cholecalciferol   2,000 Units Oral Daily   dapagliflozin  propanediol  10 mg Oral Daily   furosemide   80 mg Intravenous BID   Gerhardt's butt cream   Topical BID   hydroxyurea   500 mg Oral Daily   And   [START ON 01/07/2024] hydroxyurea   1,000 mg Oral Once per day on Monday Wednesday Friday   levothyroxine   100 mcg Oral Q0600   mexiletine  200 mg Oral Q12H   pantoprazole   40 mg Oral BID   potassium chloride   40 mEq Oral Daily   sodium chloride  flush  10-40 mL Intracatheter Q12H   sodium chloride  flush  3 mL Intravenous Q12H   spironolactone   25 mg Oral Daily   thiamine   100 mg Oral Daily   traZODone   50 mg Oral QHS   Assessment: Pt presenting after ICD shock. Has been on warfarin for mechAVR, also has hx Afib. Plan for heparin  order for bridging when INR <2.   PTA regimen as of 12/19/2023: 1 mg daily except 2 mg MWF.   8/10 AM update: INR today 2.1. Hgb 8.2, plt 442  8/10 PM update:   Repeat INR this evening = 2.1  As noted in previous pharmacy note, per discussion with cardiology they want heparin  IV infusion //bridge started this evening.   Goal of Therapy:  INR 2-3 as per outpatient visit  Heparin  level 0.3-0.7 units/ml Monitor platelets by anticoagulation protocol: Yes   Plan:  At 20:00 tonight start Heparin  IV 700 units/hr (no Bolus) Monitor for s/sx of bleeding  Anti Xa level in 8 hours Monitor daily CBC, INR, and for s/sx of bleeding   Thank you for allowing pharmacy to be a part of this patient's care.  Levorn Gaskins, RPh Clinical Pharmacist 01/06/2024 7:20 PM

## 2024-01-06 NOTE — Progress Notes (Signed)
 PHARMACY - ANTICOAGULATION CONSULT NOTE  Pharmacy Consult for heparin  Indication: AVR  Allergies  Allergen Reactions   Erythromycin Rash   Zithromax [Azithromycin] Rash    Patient Measurements: Height: 5' 9 (175.3 cm) Weight: 62.6 kg (138 lb) IBW/kg (Calculated) : 70.7 HEPARIN  DW (KG): 66.1  Vital Signs: Temp: 98.1 F (36.7 C) (08/10 0731) Temp Source: Oral (08/10 0731) BP: 120/63 (08/10 0731) Pulse Rate: 70 (08/10 0731)  Labs: Recent Labs    01/04/24 0420 01/04/24 1300 01/05/24 0315 01/06/24 0500  HGB 8.2*  --  8.3* 8.2*  HCT 25.1*  --  25.5* 25.4*  PLT 389  --  405* 442*  LABPROT 30.1*  --  26.6* 24.9*  INR 2.7*  --  2.3* 2.1*  CREATININE 1.67* 1.64* 1.75* 1.69*    Estimated Creatinine Clearance: 34.5 mL/min (A) (by C-G formula based on SCr of 1.69 mg/dL (H)).   Medical History: Past Medical History:  Diagnosis Date   Abdominal aortic aneurysm (AAA) (HCC)    AICD (automatic cardioverter/defibrillator) present    Atherosclerotic heart disease of native coronary artery without angina pectoris    Chronic anticoagulation 02/18/2018   Chronic systolic heart failure (HCC) 02/02/2017   Essential thrombocythemia (HCC) 02/15/2017   Hyperlipidemia    Hypertension    Hypothyroidism    Ischemic cardiomyopathy    Leg pain, central, left 04/17/2017   Hx sciatica - pain similar; no red flag signs on hx or exam    Presence of other heart-valve replacement    Presence of permanent cardiac pacemaker    Thrombocytosis    pt states he takes a chemo pill for this   Ventricular tachycardia (HCC)     Medications:  Medications Prior to Admission  Medication Sig Dispense Refill Last Dose/Taking   amiodarone  (PACERONE ) 200 MG tablet TAKE 0.5 TABLET BY MOUTH EVERY MORNING AND 1 TABLET EVERY EVENING (Patient taking differently: Take 100-200 mg by mouth 2 (two) times daily. TAKE 0.5 TABLET BY MOUTH EVERY MORNING AND 1 TABLET EVERY EVENING) 135 tablet 3 Past Week   Ascorbic  Acid ([employer]S VITAMIN C PO) Take 1 tablet by mouth daily.   Past Week   aspirin  81 MG tablet Take 81 mg by mouth daily.   Past Week   carvedilol  (COREG ) 3.125 MG tablet Take 1 tablet (3.125 mg total) by mouth 2 (two) times daily with a meal. 180 tablet 3 Past Week   Cholecalciferol  (VITAMIN D ) 50 MCG (2000 UT) tablet Take 2,000 Units by mouth daily.   Past Week   FARXIGA  10 MG TABS tablet TAKE 1 TABLET(10 MG) BY MOUTH DAILY (Patient taking differently: Take 10 mg by mouth daily.) 90 tablet 3 Past Week   furosemide  (LASIX ) 20 MG tablet Take 2 tablets (40 mg total) by mouth daily. 60 tablet 11 Past Week   hydroxyurea  (HYDREA ) 500 MG capsule Take 3 tablet (1500 mg )by mouth daily on Mon,Wed,Friday and 2 tabs (1000 mg) on Tues, Thursday, Sat and Sunday (Patient taking differently: See admin instructions. MWF one tab in am 2 in evening. One am evening every other day) 180 capsule 4 Past Week   ibuprofen (ADVIL) 800 MG tablet Take 800 mg by mouth daily at 12 noon.   Past Week   levothyroxine  (SYNTHROID ) 75 MCG tablet Take 75 mcg by mouth daily before breakfast.   Past Week   potassium chloride  (KLOR-CON  M) 10 MEQ tablet Take 4 tablets (40 mEq total) by mouth daily. (Patient taking differently: Take 20 mEq by mouth  daily.) 120 tablet 11 Past Week   spironolactone  (ALDACTONE ) 25 MG tablet Take 1 tablet (25 mg total) by mouth every other day. 45 tablet 3 Past Week   traZODone  (DESYREL ) 50 MG tablet Take 50 mg by mouth at bedtime.   Past Week   vitamin B-12 (CYANOCOBALAMIN ) 1000 MCG tablet Take 1,000 mcg by mouth daily.   Past Week   warfarin (COUMADIN ) 2 MG tablet Take 1 tablet (2 mg total) by mouth daily. (Patient taking differently: Take 2 mg by mouth daily. As directed by MD) 100 tablet 1 01/01/2024   warfarin (COUMADIN ) 5 MG tablet TAKE 1/2 TABLET BY MOUTH DAILY OR AS DIRECTED BY COUMADIN  CLINIC. 50 tablet 1 Past Week   Scheduled:   amiodarone   200 mg Oral BID   aspirin  EC  81 mg Oral Daily   Chlorhexidine   Gluconate Cloth  6 each Topical Daily   cholecalciferol   2,000 Units Oral Daily   dapagliflozin  propanediol  10 mg Oral Daily   furosemide   80 mg Intravenous BID   Gerhardt's butt cream   Topical BID   levothyroxine   100 mcg Oral Q0600   mexiletine  200 mg Oral Q12H   pantoprazole   40 mg Oral BID   potassium chloride   40 mEq Oral Daily   sodium chloride  flush  10-40 mL Intracatheter Q12H   sodium chloride  flush  3 mL Intravenous Q12H   spironolactone   25 mg Oral Daily   thiamine   100 mg Oral Daily   traZODone   50 mg Oral QHS   Assessment: Pt presenting after ICD shock. Has been on warfarin for mechAVR, also has hx Afib. Plan for heparin  order for bridging when INR <2.   PTA regimen as of 12/19/2023: 1 mg daily except 2 mg MWF.   8/10 AM update: INR today 2.1. Hgb 8.2, plt 442  Goal of Therapy:  INR 2-3 as per outpatient visit  Heparin  level 0.3-0.7 units/ml Monitor platelets by anticoagulation protocol: Yes   Plan:  Plan for heparin  infusion when INR <2 Monitor daily CBC, INR, and for s/sx of bleeding   Thank you for allowing pharmacy to be a part of this patient's care.  Izetta Carl, PharmD PGY1 Pharmacy Resident [facility]  01/06/2024 8:21 AM

## 2024-01-06 NOTE — Consult Note (Signed)
 Consult NOTE    Taylor [name]  FMW:[id number] DOB: Dec 17, 1950 DOA: 01/02/2024 PCP: Joella Sieving, DO  No chief complaint on file.   Brief Narrative:   73 yo with hx CAD s/p CABG, HFrEF (25-30%), s/p AICD, mechanical AV placement on coumadin , essential thrombocytosis, HTN, AAA s/p EVAR 10/2022 with bilateral common femoral endarterectomies c/b L femoral artery thrombosis s/p redo L ileofemoral endarterectomy and profundaplasty with L GSV vein patch presenting as Taylor [name] transfer from [name] in [place] after being seen for evaluation of ICD shock.    Hospitalists were consulted for medical management.    Assessment & Plan:   Principal Problem:   Ventricular tachycardia (HCC) Active Problems:   H/O mechanical aortic valve replacement   Protein-calorie malnutrition, severe   Acute on chronic combined systolic and diastolic CHF (congestive heart failure) (HCC)   Coronary artery disease involving coronary bypass graft of native heart without angina pectoris  VT with ICD Shocks Per cards, thought VT triggered by HF exacerbation + hypokalemia Amiodarone , mexiletine Monitor lytes, K>4, mag>2   Acute on Chronic Systolic Heart Failure Diuresis per cards Echo 09/2023 with EF 25-30% Coreg  currently on hold.  Spironolactone , farxiga .    Mechanical Aortic Valve On coumadin , INR 2.1 today Plan for heparin  gtt once INR <2   CKD IIIb  Fluctuating recently, baseline appears to be around 1.5-2 trend  CAD s/p CABG PAD AAA Nonhealing LE Wounds Aspirin , statin intolerant  CTA 10/2022 with occlusion of proximal SFA on L  S/p EVAR for AAA requiring bilateral common femoral endarterectomies Appreciate vascular eval - considering angiogram when stable from cards standpoint (8/11 angiogram) - ABI with falsely elevated L ABI, noncompressible RLE arteries. LE arterial US  with 50-74% stenosis in superficial femoral artery (multiple lesions throughout superfiical femoral artery).  LLE with  patent L femoral artery to popliteal artery bypass graft with no hemodynamically significant stenosis.  Essential Thrombocytosis  H/o Lacunar Strokes  Hx Seizures Followed by hematology - looks like he follows with [facility] (last note 08/29/2023) Hydroxyurea  on hold with anemia after discussion with pharmacy, platelets trending up today, will discuss resumption with pharmacy  Hypothyroidism TSH 17 -> synthroid  increased to 100 mcg, needs repeat labs in 4-6 weeks  Macrocytic Anemia Baseline appears around 9 S/p 2 units pRBC at [facility] (with presenting Hb 7) Continue to monitor   Elevated LFT's Mild, suspect due to congestion with HF above Trend with diuresis  Splenomegaly Noted on US   Hx Etoh Abuse Denied current use    DVT prophylaxis: INR therapeutic, heparin  gtt when INR <2 Code Status: full Family Communication: none Disposition:   Status is: Inpatient Remains inpatient appropriate because: need for continued inpatient care   Consultants:  Hospitalists Cardiology is primary  Procedures:  non  Antimicrobials:  Anti-infectives (From admission, onward)    None       Subjective: No new complaints Slept well  Objective: Vitals:   01/06/24 0233 01/06/24 0405 01/06/24 0500 01/06/24 0731  BP: 123/67 111/69  120/63  Pulse: 70 88  70  Resp: 20 18  19   Temp: 98.2 F (36.8 C) 98.7 F (37.1 C)  98.1 F (36.7 C)  TempSrc: Oral Oral  Oral  SpO2: 100% 100%  94%  Weight:   62.6 kg   Height:        Intake/Output Summary (Last 24 hours) at 01/06/2024 0832 Last data filed at 01/06/2024 0733 Gross per 24 hour  Intake 476 ml  Output 3300 ml  Net -2824 ml  Filed Weights   01/03/24 0447 01/05/24 0701 01/06/24 0500  Weight: 68.2 kg 68.2 kg 62.6 kg    Examination:  General: No acute distress. Cardiovascular: RRR Lungs: unlabored Neurological: Alert and oriented 3. Moves all extremities 4 with equal strength. Cranial nerves II through XII grossly  intact. Extremities: No clubbing or cyanosis. No edema.   Data Reviewed: I have personally reviewed following labs and imaging studies  CBC: Recent Labs  Lab 01/02/24 1211 01/03/24 0500 01/03/24 1522 01/04/24 0420 01/05/24 0315 01/06/24 0500  WBC 4.3 3.7*  --  5.6 5.9 6.8  NEUTROABS  --  2.9  --   --   --   --   HGB 9.0* 7.7* 9.3* 8.2* 8.3* 8.2*  HCT 27.5* 23.7* 28.2* 25.1* 25.5* 25.4*  MCV 113.2* 113.4*  --  114.1* 114.9* 114.4*  PLT 435* 372  --  389 405* 442*    Basic Metabolic Panel: Recent Labs  Lab 01/02/24 1211 01/03/24 0500 01/04/24 0420 01/04/24 1300 01/05/24 0315 01/06/24 0500  NA 133* 131* 135 133* 131* 133*  K 2.7* 3.5 4.5 4.2 4.5 3.8  CL 100 98 98 96* 95* 90*  CO2 21* 25 24 26 25 29   GLUCOSE 127* 109* 126* 116* 116* 112*  BUN 41* 37* 39* 38* 39* 49*  CREATININE 1.51* 1.56* 1.67* 1.64* 1.75* 1.69*  CALCIUM  9.0 8.3* 8.6* 8.8* 8.8* 8.6*  MG 1.9 2.1  --   --   --   --   PHOS  --  3.5  --   --   --   --     GFR: Estimated Creatinine Clearance: 34.5 mL/min (Maylon Sailors) (by C-G formula based on SCr of 1.69 mg/dL (H)).  Liver Function Tests: Recent Labs  Lab 01/02/24 1211 01/03/24 0500 01/05/24 0315  AST 56* 35 50*  ALT 88* 62* 61*  ALKPHOS 70 58 70  BILITOT 1.3* 0.9 1.2  PROT 6.8 5.8* 6.2*  ALBUMIN  2.5* 2.0* 2.2*    CBG: No results for input(s): GLUCAP in the last 168 hours.   No results found for this or any previous visit (from the past 240 hours).       Radiology Studies: VAS US  LOWER EXTREMITY ARTERIAL DUPLEX Result Date: 01/04/2024 LOWER EXTREMITY ARTERIAL DUPLEX STUDY Patient Name:  Taylor [name]  Date of Exam:   01/04/2024 Medical Rec #: [id number]              Accession #:    [id number] Date of Birth: Sep 21, 1950              Patient Gender: M Patient Age:   76 years Exam Location:  [facility] Procedure:      VAS US  LOWER EXTREMITY ARTERIAL DUPLEX Referring Phys: DEBBY ROBERTSON  --------------------------------------------------------------------------------  Indications: Rest pain. High Risk Factors: Hypertension, past history of smoking.  Vascular Interventions: Multiple interventions including EVAR and left fem-pop                         art BPG. Current ABI:            R- N/C, L-1.42 Limitations: pt unable to tolerate any pressure near feet due to heel ulcer. Performing Technologist: Elmarie Lindau, RVT  Examination Guidelines: Ashaki Frosch complete evaluation includes B-mode imaging, spectral Doppler, color Doppler, and power Doppler as needed of all accessible portions of each vessel. Bilateral testing is considered an integral part of Lisa-Marie Rueger complete examination. Limited examinations for reoccurring indications may be  performed as noted.  +----------+--------+-----+---------------+-----------+--------+ RIGHT     PSV cm/sRatioStenosis       Waveform   Comments +----------+--------+-----+---------------+-----------+--------+ CFA Prox  102                         multiphasic         +----------+--------+-----+---------------+-----------+--------+ DFA       0                                               +----------+--------+-----+---------------+-----------+--------+ SFA Prox  260          50-74% stenosismultiphasic         +----------+--------+-----+---------------+-----------+--------+ SFA Mid   99                          multiphasic         +----------+--------+-----+---------------+-----------+--------+ SFA Distal321          50-74% stenosismultiphasic         +----------+--------+-----+---------------+-----------+--------+ POP Prox  52                          monophasic          +----------+--------+-----+---------------+-----------+--------+ POP Distal47                          monophasic          +----------+--------+-----+---------------+-----------+--------+ ATA Distal79                          monophasic           +----------+--------+-----+---------------+-----------+--------+ PTA Distal74                          monophasic          +----------+--------+-----+---------------+-----------+--------+  Left Graft #1: Left Femoral Artery to Popliteal Artery Bypass Graft +--------------------+--------+--------+---------+--------+                     PSV cm/sStenosisWaveform Comments +--------------------+--------+--------+---------+--------+ Inflow              182             triphasic         +--------------------+--------+--------+---------+--------+ Proximal Anastomosis112             triphasic         +--------------------+--------+--------+---------+--------+ Proximal Graft      92              triphasic         +--------------------+--------+--------+---------+--------+ Mid Graft           81              triphasic         +--------------------+--------+--------+---------+--------+ Distal Graft        69              triphasic         +--------------------+--------+--------+---------+--------+ Distal Anastomosis  70              triphasic         +--------------------+--------+--------+---------+--------+ Outflow             167  triphasic         +--------------------+--------+--------+---------+--------+   Summary: Right: 50-74% stenosis noted in the superficial femoral artery. There are multiple lesions throughout the superficial femoral artery. Left: Patent left femoral artery to popliteal artery bypass graft with no hemodynamically significant stenosis.  See table(s) above for measurements and observations. Electronically signed by Norman Serve on 01/04/2024 at 4:38:01 PM.    Final    VAS US  ABI WITH/WO TBI Result Date: 01/04/2024  LOWER EXTREMITY DOPPLER STUDY Patient Name:  Taylor [name]  Date of Exam:   01/04/2024 Medical Rec #: [id number]              Accession #:    [id number] Date of Birth: August 06, 1950              Patient Gender: M Patient  Age:   60 years Exam Location:  [facility] Procedure:      VAS US  ABI WITH/WO TBI Referring Phys: DEBBY ROBERTSON --------------------------------------------------------------------------------  Indications: Rest pain. High Risk Factors: Hypertension, past history of smoking.  Vascular Interventions: Multiple interventions including EVAR and left Fem-Pop                         art BPG. Limitations: Today's exam was limited due to patient positioning and patient              intolerant to cuff pressure. Comparison Study: 10/09/2022 Falsely elevated ABI bilaterally. Performing Technologist: Jimmye Scarce RVT  Examination Guidelines: Raffaele Derise complete evaluation includes at minimum, Doppler waveform signals and systolic blood pressure reading at the level of bilateral brachial, anterior tibial, and posterior tibial arteries, when vessel segments are accessible. Bilateral testing is considered an integral part of Syaire Saber complete examination. Photoelectric Plethysmograph (PPG) waveforms and toe systolic pressure readings are included as required and additional duplex testing as needed. Limited examinations for reoccurring indications may be performed as noted.  ABI Findings: +--------+------------------+-----+----------+--------+ Right   Rt Pressure (mmHg)IndexWaveform  Comment  +--------+------------------+-----+----------+--------+ Brachial                                 IV       +--------+------------------+-----+----------+--------+ PTA     197               1.71 monophasic         +--------+------------------+-----+----------+--------+ DP      254               2.21 monophasic         +--------+------------------+-----+----------+--------+ +--------+------------------+-----+-----------+-------+ Left    Lt Pressure (mmHg)IndexWaveform   Comment +--------+------------------+-----+-----------+-------+ [id number]                                        +--------+------------------+-----+-----------+-------+ PTA     127               1.10 multiphasic        +--------+------------------+-----+-----------+-------+ DP      163               1.42 triphasic          +--------+------------------+-----+-----------+-------+ +-------+-----------+-----------+------------+------------+ ABI/TBIToday's ABIToday's TBIPrevious ABIPrevious TBI +-------+-----------+-----------+------------+------------+ Right  N/C                                            +-------+-----------+-----------+------------+------------+  Left   1.42                                           +-------+-----------+-----------+------------+------------+  Left ABI is falsely elevated.  Summary: Right: Resting right ankle-brachial index indicates noncompressible right lower extremity arteries. Left: Left ankle brachial index is falsely elevated. *See table(s) above for measurements and observations.  Electronically signed by Norman Serve on 01/04/2024 at 4:37:41 PM.    Final         Scheduled Meds:  amiodarone   200 mg Oral BID   aspirin  EC  81 mg Oral Daily   Chlorhexidine  Gluconate Cloth  6 each Topical Daily   cholecalciferol   2,000 Units Oral Daily   dapagliflozin  propanediol  10 mg Oral Daily   furosemide   80 mg Intravenous BID   Gerhardt's butt cream   Topical BID   levothyroxine   100 mcg Oral Q0600   mexiletine  200 mg Oral Q12H   pantoprazole   40 mg Oral BID   potassium chloride   40 mEq Oral Daily   sodium chloride  flush  10-40 mL Intracatheter Q12H   sodium chloride  flush  3 mL Intravenous Q12H   spironolactone   25 mg Oral Daily   thiamine   100 mg Oral Daily   traZODone   50 mg Oral QHS   Continuous Infusions:     LOS: 4 days    Time spent: over 30 min     Meliton Monte, MD [facility]   To contact the attending provider between 7A-7P or the covering provider during after hours 7P-7A, please log into the web site [email or web address]  and access using universal [place] password for that web site. If you do not have the password, please call the hospital operator.  01/06/2024, 8:32 AM

## 2024-01-06 NOTE — Progress Notes (Signed)
  Progress Note    01/06/2024 7:48 AM * No surgery found *  Subjective:  sound asleep, did not easily wake so left him sleeping   Vitals:   01/06/24 0405 01/06/24 0731  BP: 111/69 120/63  Pulse: 88 70  Resp: 18 19  Temp: 98.7 F (37.1 C) 98.1 F (36.7 C)  SpO2: 100% 94%   Physical Exam: Cardiac:  regular Lungs:  2L [place] Extremities:  BLE with Prevalon boots in place Neurologic: asleep   CBC    Component Value Date/Time   WBC 6.8 01/06/2024 0500   RBC 2.22 (L) 01/06/2024 0500   HGB 8.2 (L) 01/06/2024 0500   HGB 12.6 (L) 02/07/2018 1336   HCT 25.4 (L) 01/06/2024 0500   HCT 35.0 (L) 02/07/2018 1336   PLT 442 (H) 01/06/2024 0500   PLT 440 02/07/2018 1336   MCV 114.4 (H) 01/06/2024 0500   MCV 108 (H) 02/07/2018 1336   MCH 36.9 (H) 01/06/2024 0500   MCHC 32.3 01/06/2024 0500   RDW 29.9 (H) 01/06/2024 0500   RDW 15.7 (H) 02/07/2018 1336   LYMPHSABS 0.1 (L) 01/03/2024 0500   LYMPHSABS 0.7 02/07/2018 1336   MONOABS 0.6 01/03/2024 0500   EOSABS 0.0 01/03/2024 0500   EOSABS 0.1 02/07/2018 1336   BASOSABS 0.0 01/03/2024 0500   BASOSABS 0.0 02/07/2018 1336    BMET    Component Value Date/Time   NA 133 (L) 01/06/2024 0500   NA 136 07/04/2022 1212   K 3.8 01/06/2024 0500   CL 90 (L) 01/06/2024 0500   CO2 29 01/06/2024 0500   GLUCOSE 112 (H) 01/06/2024 0500   BUN 49 (H) 01/06/2024 0500   BUN 24 07/04/2022 1212   CREATININE 1.69 (H) 01/06/2024 0500   CALCIUM  8.6 (L) 01/06/2024 0500   GFRNONAA 42 (L) 01/06/2024 0500   GFRAA >60 10/06/2019 1055    INR    Component Value Date/Time   INR 2.1 (H) 01/06/2024 0500   INR 4.3 01/25/2023 1513     Intake/Output Summary (Last 24 hours) at 01/06/2024 0748 Last data filed at 01/06/2024 9266 Gross per 24 hour  Intake 476 ml  Output 3300 ml  Net -2824 ml     Assessment/Plan:  73 y.o. male with bilateral lower extremity wounds and rest pain  Poor Cardiac status that limits revascularization options Appears to be  optimized from cardiac standpoint for procedure. Appreciate Cardiology input Scheduled for Angiogram of BLE via left brachial access tomorrow 8/11 with Dr. Sheree AKI with CKD, Scr now around baseline NPO after midnight Consent ordered   Teretha Damme, PA-C [facility] [phone] 01/06/2024 7:48 AM

## 2024-01-07 ENCOUNTER — Encounter (HOSPITAL_COMMUNITY)
Admission: EM | Disposition: A | Discharge status: Hospice | Payer: Self-pay | Source: Other Acute Inpatient Hospital | Attending: Cardiology

## 2024-01-07 DIAGNOSIS — I472 Ventricular tachycardia, unspecified: Secondary | ICD-10-CM | POA: Diagnosis not present

## 2024-01-07 DIAGNOSIS — R7989 Other specified abnormal findings of blood chemistry: Secondary | ICD-10-CM

## 2024-01-07 LAB — CBC
HCT: 25 % — ABNORMAL LOW (ref 39.0–52.0)
Hemoglobin: 8 g/dL — ABNORMAL LOW (ref 13.0–17.0)
MCH: 36.9 pg — ABNORMAL HIGH (ref 26.0–34.0)
MCHC: 32 g/dL (ref 30.0–36.0)
MCV: 115.2 fL — ABNORMAL HIGH (ref 80.0–100.0)
Platelets: 469 K/uL — ABNORMAL HIGH (ref 150–400)
RBC: 2.17 MIL/uL — ABNORMAL LOW (ref 4.22–5.81)
RDW: 29.5 % — ABNORMAL HIGH (ref 11.5–15.5)
WBC: 5.7 K/uL (ref 4.0–10.5)
nRBC: 0 % (ref 0.0–0.2)

## 2024-01-07 LAB — BASIC METABOLIC PANEL WITH GFR
Anion gap: 9 (ref 5–15)
BUN: 51 mg/dL — ABNORMAL HIGH (ref 8–23)
CO2: 33 mmol/L — ABNORMAL HIGH (ref 22–32)
Calcium: 8.6 mg/dL — ABNORMAL LOW (ref 8.9–10.3)
Chloride: 89 mmol/L — ABNORMAL LOW (ref 98–111)
Creatinine, Ser: 1.86 mg/dL — ABNORMAL HIGH (ref 0.61–1.24)
GFR, Estimated: 38 mL/min — ABNORMAL LOW (ref >60)
Glucose, Bld: 102 mg/dL — ABNORMAL HIGH (ref 70–99)
Potassium: 3.7 mmol/L (ref 3.5–5.1)
Sodium: 131 mmol/L — ABNORMAL LOW (ref 135–145)

## 2024-01-07 LAB — COOXEMETRY PANEL
Carboxyhemoglobin: 2.5 % — ABNORMAL HIGH (ref 0.5–1.5)
Carboxyhemoglobin: 2 % — ABNORMAL HIGH (ref 0.5–1.5)
Methemoglobin: <0.7 % (ref 0.0–1.5)
Methemoglobin: <0.7 % (ref 0.0–1.5)
O2 Saturation: 93.4 %
O2 Saturation: 52.6 %
Total hemoglobin: 8.5 g/dL — ABNORMAL LOW (ref 12.0–16.0)
Total hemoglobin: 9.2 g/dL — ABNORMAL LOW (ref 12.0–16.0)

## 2024-01-07 LAB — VITAMIN A: Vitamin A (Retinoic Acid): 7.4 ug/dL — ABNORMAL LOW (ref 22.0–69.5)

## 2024-01-07 LAB — PROTIME-INR
INR: 2.1 — ABNORMAL HIGH (ref 0.8–1.2)
Prothrombin Time: 24.5 s — ABNORMAL HIGH (ref 11.4–15.2)

## 2024-01-07 LAB — HEPARIN LEVEL (UNFRACTIONATED): Heparin Unfractionated: <0.1 [IU]/mL — ABNORMAL LOW (ref 0.30–0.70)

## 2024-01-07 SURGERY — LOWER EXTREMITY ANGIOGRAPHY
Anesthesia: LOCAL

## 2024-01-07 MED ORDER — FUROSEMIDE 10 MG/ML IJ SOLN
80.0000 mg | Freq: Two times a day (BID) | INTRAMUSCULAR | Status: AC
  Administered 2024-01-07 (×2): 80 mg via INTRAVENOUS

## 2024-01-07 MED ORDER — HEPARIN (PORCINE) 25000 UT/250ML-% IV SOLN
1050.0000 [IU]/h | INTRAVENOUS | Status: DC
  Administered 2024-01-07 (×2): 900 [IU]/h via INTRAVENOUS
  Filled 2024-01-07: qty 250

## 2024-01-07 MED ORDER — LIDOCAINE HCL (PF) 1 % IJ SOLN
INTRAMUSCULAR | Status: AC
Start: 2024-01-07 — End: 2024-01-07
  Filled 2024-01-07: qty 30

## 2024-01-07 MED ORDER — POTASSIUM CHLORIDE CRYS ER 20 MEQ PO TBCR
40.0000 meq | EXTENDED_RELEASE_TABLET | Freq: Once | ORAL | Status: AC
  Administered 2024-01-07 (×2): 40 meq via ORAL
  Filled 2024-01-07: qty 2

## 2024-01-07 NOTE — TOC Progression Note (Signed)
 Transition of Care [facility]) - Progression Note    Patient Details  Name: Taylor [name] MRN: [id number] Date of Birth: 1950-09-08  [facility] CM/SW Contact  Justina Delcia Czar, RN Phone Number: [phone] 01/07/2024, 2:47 PM  Clinical Narrative:   Patient gave permission to speak to sister.   Spoke to patient's sister. She is agreeable to IP rehab or SNF rehab. Waiting PT/OT evaluation and recommendation.  Sister states she assist with care, she lives a couple of miles from patient.  Will continue to follow for dc needs.    Expected Discharge Plan: Home w Home Health Services Barriers to Discharge: Continued Medical Work up               Expected Discharge Plan and Services   Discharge Planning Services: CM Consult Post Acute Care Choice: Home Health Living arrangements for the past 2 months: Single Family Home                                       Social Drivers of Health (SDOH) Interventions SDOH Screenings   Food Insecurity: Food Insecurity Present (01/02/2024)  Housing: Low Risk  (01/06/2024)  Transportation Needs: No Transportation Needs (01/02/2024)  Utilities: Not At Risk (01/02/2024)  Depression (PHQ2-9): Low Risk  (04/03/2022)  Financial Resource Strain: Medium Risk (09/26/2023)   Received from [facility]  Social Connections: Unknown (01/06/2024)  Tobacco Use: Medium Risk (01/06/2024)    Readmission Risk Interventions    11/14/2022   11:39 AM  Readmission Risk Prevention Plan  Transportation Screening Complete  PCP or Specialist Appt within 5-7 Days Complete  Home Care Screening Complete  Medication Review (RN CM) Complete

## 2024-01-07 NOTE — Evaluation (Signed)
 Physical Therapy Evaluation Patient Details Name: Taylor [name] [facility] MRN: [id number] DOB: [birth year] Today's Date: 01/07/2024  History of Present Illness  Pt is a 73 y/o M admitted on 01/02/24 after presenting from outside hospital after being seen for evaluation of ICD shock. Pt is being treated for ventricular tachycardia. Pt scheduled for BLE angiogram on 01/07/24. PMH: CAD s/p CABG, HFrEF, s/p AICD, mechanical AV placement on coumadin , essential thrombocytosis, HTN, AAA s/p EVAR 10/2022 with B common femoral endarterectomies c/b L femoral artery thrombosis s/p redo L ileofemoral endarterectomy & profundaplasty with L GSV vein patch  Clinical Impression  Pt seen for PT evaluation with pt agreeable, sister Audria) present for session. Previously pt was mod I with SPC but began using rollator ~1 month ago, then experienced ~4-5 falls in the past week. On this date, pt presents with BLE wounds, decreased skin integrity, pain with light touch/movement. Pt requires +1-2 assist for supine<>sitting EOB with hospital bed features. Pt tolerates sitting EOB at least 10 minutes with supervision. Pt with poor initiation of sit>supine, ultimately requiring +2 to assist. Pt is currently unsafe to d/c home alone. Recommend ongoing acute PT services & post acute rehab <3 hours therapy/day upon d/c.        If plan is discharge home, recommend the following: Two people to help with walking and/or transfers;A lot of help with bathing/dressing/bathroom;Assistance with cooking/housework;Help with stairs or ramp for entrance;Assist for transportation   Can travel by private vehicle   No    Equipment Recommendations Other (comment) (defer to next venue)  Recommendations for Other Services       Functional Status Assessment Patient has had a recent decline in their functional status and demonstrates the ability to make significant improvements in function in a reasonable and predictable amount of time.      Precautions / Restrictions Precautions Precautions: Fall Restrictions Weight Bearing Restrictions Per Provider Order: No      Mobility  Bed Mobility Overal bed mobility: Needs Assistance Bed Mobility: Supine to Sit, Sit to Supine     Supine to sit: Mod assist, Max assist, HOB elevated, Used rails (significantly extra time, use of bed rails, cuing for technique) Sit to supine: Total assist, +2 for physical assistance, +2 for safety/equipment   General bed mobility comments: +2 to scoot to [facility]    Transfers Overall transfer level:  (deferred 2/2 BLE pain)                      Ambulation/Gait                  Stairs            Wheelchair Mobility     Tilt Bed    Modified Rankin (Stroke Patients Only)       Balance Overall balance assessment: Needs assistance Sitting-balance support: Feet supported, Bilateral upper extremity supported, Single extremity supported Sitting balance-Leahy Scale: Fair Sitting balance - Comments: supervision static sitting but keeps cervical spine flexed forward, is able to extend it neutral x 1 with cuing/encouragement to attempt                                     Pertinent Vitals/Pain Pain Assessment Pain Assessment: Faces Faces Pain Scale: Hurts whole lot Pain Location: BLE Pain Descriptors / Indicators: Discomfort, Grimacing, Guarding Pain Intervention(s): Monitored during session, Limited activity within patient's tolerance, Repositioned (BLE prevalon  boots donned at end of session)    Home Living Family/patient expects to be discharged to:: Private residence Living Arrangements:  (sister) Available Help at Discharge: Family Type of Home: House Home Access: Stairs to enter   [employer] of Steps: single threshold step   Home Layout: One level Home Equipment: Cane - single point;Rollator (4 wheels)      Prior Function               Mobility Comments: Was using a SPC  until ~1 month ago when  he started using rollator but walking limited household distances. Pt with ~4-5 falls in the past week. ADLs Comments: Was still prepping his own meals, family assisting with driving.     Extremity/Trunk Assessment   Upper Extremity Assessment Upper Extremity Assessment: Generalized weakness    Lower Extremity Assessment Lower Extremity Assessment: RLE deficits/detail;LLE deficits/detail (chronic wounds on BLE, decreased skin integrity & coloring in BLE distal to knees) RLE Deficits / Details: 2+/5 knee extension in sitting, 2-/5 hip flexion in sitting, does not attempt dorsiflexion 2/2 pain LLE Deficits / Details: 2+/5 knee extension in sitting, 2-/5 hip flexion in sitting, does not attempt dorsiflexion 2/2 pain    Cervical / Trunk Assessment Cervical / Trunk Assessment:  (rounded shoulders, forward head)  Communication   Communication Communication: Impaired Factors Affecting Communication: Reduced clarity of speech    Cognition Arousal: Alert Behavior During Therapy: Flat affect   PT - Cognitive impairments: Initiation, Awareness, Safety/Judgement, Sequencing, Problem solving                       PT - Cognition Comments: very poor initiation of mobility tasks throughout session even when given plenty of time. Following commands: Impaired Following commands impaired: Follows one step commands with increased time, Follows one step commands inconsistently     Cueing Cueing Techniques: Verbal cues, Visual cues, Tactile cues, Gestural cues     General Comments General comments (skin integrity, edema, etc.): Pt with nasal cannula out of nose, poor pleth waveform so unsure of accuracy of SpO2 readings, so pt placed on 2L/min & left on 2L/min, nurse made aware    Exercises     Assessment/Plan    PT Assessment Patient needs continued PT services  PT Problem List Decreased strength;Pain;Cardiopulmonary status limiting activity;Decreased range  of motion;Decreased activity tolerance;Decreased balance;Decreased mobility;Decreased safety awareness;Decreased knowledge of precautions;Decreased knowledge of use of DME;Decreased cognition;Decreased skin integrity;Impaired sensation;Decreased coordination       PT Treatment Interventions DME instruction;Balance training;Gait training;Neuromuscular re-education;Stair training;Functional mobility training;Therapeutic activities;Therapeutic exercise;Patient/family education;Wheelchair mobility training    PT Goals (Current goals can be found in the Care Plan section)  Acute Rehab PT Goals Patient Stated Goal: decreased pain, return to PLOF PT Goal Formulation: With patient/family Time For Goal Achievement: 01/21/24 Potential to Achieve Goals: Fair    Frequency Min 2X/week     Co-evaluation               AM-PAC PT 6 Clicks Mobility  Outcome Measure Help needed turning from your back to your side while in a flat bed without using bedrails?: A Lot Help needed moving from lying on your back to sitting on the side of a flat bed without using bedrails?: Total Help needed moving to and from a bed to a chair (including a wheelchair)?: Total Help needed standing up from a chair using your arms (e.g., wheelchair or bedside chair)?: Total Help needed to walk in hospital room?: Total  Help needed climbing 3-5 steps with a railing? : Total 6 Click Score: 7    End of Session   Activity Tolerance: Patient limited by fatigue;Patient limited by pain Patient left: in bed;with bed alarm set;with call bell/phone within reach;with family/visitor present (BLE prevalon boots donned) Nurse Communication:  (O2) PT Visit Diagnosis: Muscle weakness (generalized) (M62.81);Other abnormalities of gait and mobility (R26.89);Difficulty in walking, not elsewhere classified (R26.2);Pain Pain - Right/Left:  (bilateral) Pain - part of body: Leg;Ankle and joints of foot    Time: 8541-8472 PT Time  Calculation (min) (ACUTE ONLY): 29 min   Charges:   PT Evaluation $PT Eval Moderate Complexity: 1 Mod   PT General Charges $$ ACUTE PT VISIT: 1 Visit         Richerd Pinal, PT, DPT 01/07/24, 3:37 PM   Richerd CHRISTELLA Pinal 01/07/2024, 3:36 PM

## 2024-01-07 NOTE — Telephone Encounter (Signed)
 Patient remains hospitalized at [facility]

## 2024-01-07 NOTE — Progress Notes (Addendum)
 Advanced Heart Failure Rounding Note  HF Cardiologist: Dr. Cherrie  Chief Complaint: acute on chronic systolic heart failure and VT Patient Profile   Taylor [name] is a 73 y.o male with mechnical AVR on chronic coumadin , h/o VF arrest s/p ICD, CAD s/p CABG, chronic systolic HF (EF 20-25%), AAA, h/o essential thrombosis and severe PAD w/ chronic nonhealing wounds, S/p EVAR for AAA requiring bilateral common femoral endarterectomies, admitted for VT w/ ICD shocks and a/c CHF w/ volume overload. W/ FTT prior to admit and had stopped taking many of his medications.   Subjective:    Plan for BLE angio today.  2.9L UOP, intake not complete. Weight overall down 12lbs. No weight today.   Lying in bed. Son at bedside. Feeling well this morning. No CP or SOB.   Objective:    Weight Range: 62.6 kg Body mass index is 20.38 kg/m.   Vital Signs:   Temp:  [97.8 F (36.6 C)-98.2 F (36.8 C)] 97.8 F (36.6 C) (08/11 0748) Pulse Rate:  [70-75] 75 (08/11 0748) Resp:  [13-20] 20 (08/11 0748) BP: (105-118)/(57-85) 118/57 (08/11 0748) SpO2:  [91 %-100 %] 98 % (08/11 0748) Last BM Date : 01/03/24  Weight change: Filed Weights   01/03/24 0447 01/05/24 0701 01/06/24 0500  Weight: 68.2 kg 68.2 kg 62.6 kg   Intake/Output:  Intake/Output Summary (Last 24 hours) at 01/07/2024 0818 Last data filed at 01/07/2024 0748 Gross per 24 hour  Intake --  Output 2890 ml  Net -2890 ml    Physical Exam   General: Chronically ill appearing. No distress on RA Cardiac: JVP ~7cm. S1 and S2 present. No murmurs or rub. Extremities: Warm and dry.  No BLE edema. BLE ruddy w wounds Neuro: Alert and oriented x3. Affect flat. Poor mobility.   Telemetry   VP 70 (personally reviewed)  Labs    CBC Recent Labs    01/06/24 0500 01/07/24 0520  WBC 6.8 5.7  HGB 8.2* 8.0*  HCT 25.4* 25.0*  MCV 114.4* 115.2*  PLT 442* 469*   Basic Metabolic Panel Recent Labs    91/89/74 0500 01/07/24 0520  NA 133*  131*  K 3.8 3.7  CL 90* 89*  CO2 29 33*  GLUCOSE 112* 102*  BUN 49* 51*  CREATININE 1.69* 1.86*  CALCIUM  8.6* 8.6*   Liver Function Tests Recent Labs    01/05/24 0315  AST 50*  ALT 61*  ALKPHOS 70  BILITOT 1.2  PROT 6.2*  ALBUMIN  2.2*   BNP (last 3 results) Recent Labs    05/02/23 1435 08/01/23 1501 11/22/23 1240  BNP 753.7* 560.2* 1,219.5*   ProBNP (last 3 results) Recent Labs    08/29/23 1158  PROBNP 9,442*   Medications:    Scheduled Medications:  amiodarone   200 mg Oral BID   aspirin  EC  81 mg Oral Daily   Chlorhexidine  Gluconate Cloth  6 each Topical Daily   cholecalciferol   2,000 Units Oral Daily   dapagliflozin  propanediol  10 mg Oral Daily   furosemide   80 mg Intravenous BID   Gerhardt's butt cream   Topical BID   hydroxyurea   500 mg Oral Daily   And   hydroxyurea   1,000 mg Oral Once per day on Monday Wednesday Friday   levothyroxine   100 mcg Oral Q0600   mexiletine  200 mg Oral Q12H   pantoprazole   40 mg Oral BID   potassium chloride   40 mEq Oral Daily   sodium chloride  flush  10-40  mL Intracatheter Q12H   sodium chloride  flush  3 mL Intravenous Q12H   spironolactone   25 mg Oral Daily   thiamine   100 mg Oral Daily   traZODone   50 mg Oral QHS    Infusions:  heparin  900 Units/hr (01/07/24 0720)    PRN Medications: acetaminophen , ondansetron  (ZOFRAN ) IV, oxyCODONE , sodium chloride  flush, sodium chloride  flush, traMADol   Assessment/Plan   1. VT w/ ICD Shocks - H/o VF Arrest and H/o CHB. S/p [employer] CRT-D device, followed by EP (Dr. Fernande) - recent occurrence, EF 25%. Also known CAD s/p CABG in 2017 but no recent CP. Initial labs at OSH K 3.5, Mg 2.0, HS troponin 126 -> 114 TSH 8.74 Free T4 0.97. Repeat K here at [facility] was 2.7 - Suspect VT triggered by worsening HF w/ marked volume overload + hypokalemia  - No further VT overnight.  - continue amio 200 mg bid - continue mexiletine 200 mg bid   - keep K > 4.0 and Mg > 2.0   2. Acute on  Chronic Systolic Heart Failure - Echo (05/2016 @ [facility]): EF 40% - Longstanding EF 25-30% from 2018 to now.  - Most recent 5/25 EF 25-30%, G2DD, mildly reduced RV. - s/p CRT-D.  - Chronically NYHA III-IV mainly limited currently by LE wounds and poor mobility - Admitted w/ marked volume overload. Had missed several days of home meds (feeling poorly) - Coox 49 yesterday. Inaccurate at 93 today. STAT repeat - Has diuresed well with IV Lasix . CVP 8-10. One more dose IV Lasix  80 today then PO tomorrow.  - GDMT limited by hypotension and CKD.  - Hold coreg  pending repeat coox - Stop Farxiga  with poor mobility and wound healing - Continue Spiro 25 mg daily  - Has failed evaluation for Barostim and CCM - Currently not candidate for advanced therapies with frailty, severe PAD/open wounds.    3. CAD - h/o remote PCI in 1994 at [facility] - s/p LHC in 2017 in the setting of Vfib Arrest w/ 3V CAD treated w/ CABG x 3 - he denies angina. HS trop trend not c/w ACS  - Continue ASA  - statin intolerant, needs to be consider for PSK9i  - holding ? blocker for now    4. Mechanical Aortic Valve - Stable on last echo  - On coumadin  PTA. Continue heparin  gtt   5. PAD with AAA with nonhealing LE wounds - 6/24 EVAR for AAA requiring bilateral common femoral endarterectomies.  He had to go back on 11/10/2022 for a thrombosed left common femoral artery after endarterectomy. Then went to [facility] for left fem-pop. Course c/b LLE foot ulcer - He had been trying to get in w/ [facility] due to concern for potential need for amputation and effort for limb salvage - Continues to struggle with nonhealing wounds. Has been followed at [facility] and here. - Plan for BLE angio today with Dr. Sheree.    6. IDA - Hgb 9.0>>7.7>>8.2 - Got 2 units PRBCs at hospital in [place].   - Ferritin 79, T sat 12% s/p IV Fe.   Length of Stay: 5  [place] Lee, NP  01/07/2024, 8:18 AM  Advanced Heart Failure Team Pager [phone] (M-F; 7a - 5p)   Please contact [facility] for night-coverage after hours (5p -7a ) and weekends on [email or web address]  Patient seen and examined with the above-signed Advanced Practice Provider and/or Housestaff. I personally reviewed laboratory data, imaging studies and relevant notes. I independently examined the patient and formulated  the important aspects of the plan. I have edited the note to reflect any of my changes or salient points. I have personally discussed the plan with the patient and/or family.  Feels weak. Has not gotten out of bed. Denies CP or SOB. Co-ox marginal at 53% CVP 9-10  Pending LE angio today  General:  chronically-ill appearing. No resp difficulty HEENT: normal Neck: supple. JVP 10 Cor: Regular rate & rhythm. Mech s2 Lungs: clear Abdomen: soft, nontender, nondistended. No hepatosplenomegaly. No bruits or masses. Good bowel sounds. Extremities: no cyanosis, clubbing, rash, edema Severe BLE wounds  Neuro: alert & orientedx3, cranial nerves grossly intact. moves all 4 extremities w/o difficulty. Affect pleasant'  He is end-stage. Agree with continuing gentle IV diuresis. For BLE angion today. We discussed procedure in detail. INR 2.1 Can stop heparin . Discussed warfarin dosing with PharmD personally.  He is not candidate for advanced HF therapies. May need palliative services in the near future.   Toribio Fuel, MD  12:20 PM

## 2024-01-07 NOTE — Progress Notes (Signed)
 PHARMACY - ANTICOAGULATION  Pharmacy Consult for heparin  Indication: AVR/Afib Brief A/P: Heparin  level subtherapeutic Increase Heparin  rate  Allergies  Allergen Reactions   Erythromycin Rash   Zithromax [Azithromycin] Rash    Patient Measurements: Height: 5' 9 (175.3 cm) Weight: 62.6 kg (138 lb) IBW/kg (Calculated) : 70.7 HEPARIN  DW (KG): 66.1  Vital Signs: Temp: 98.2 F (36.8 C) (08/11 1645) Temp Source: Oral (08/11 1645) BP: 103/54 (08/11 1645) Pulse Rate: 75 (08/11 1645)  Labs: Recent Labs    01/05/24 0315 01/06/24 0500 01/06/24 1600 01/07/24 0520  HGB 8.3* 8.2*  --  8.0*  HCT 25.5* 25.4*  --  25.0*  PLT 405* 442*  --  469*  LABPROT 26.6* 24.9* 24.5* 24.5*  INR 2.3* 2.1* 2.1* 2.1*  HEPARINUNFRC  --   --   --  <0.10*  CREATININE 1.75* 1.69*  --  1.86*    Estimated Creatinine Clearance: 31.3 mL/min (A) (by C-G formula based on SCr of 1.86 mg/dL (H)).   Assessment: 73 y.o. male with h/o Afib and AVR, INR subtherapeutic, for heparin .  Procedure has been moved until tomorrow due to INR >1.8. Ok to resume heparin  bridge per Dr. Lanis  Goal of Therapy:  INR 2-3 as per outpatient visit  Heparin  level 0.3-0.7 units/ml Monitor platelets by anticoagulation protocol: Yes   Plan:  Resume Heparin  900 units/hr Check heparin  level in AM F/u post procedure  Sergio Batch, PharmD, BCIDP, AAHIVP, CPP Infectious Disease Pharmacist 01/07/2024 6:20 PM

## 2024-01-07 NOTE — Progress Notes (Signed)
  Progress Note    01/07/2024 7:31 AM * No surgery found *  Subjective: No complaints    Vitals:   01/06/24 1924 01/06/24 2311  BP: 116/68 117/64  Pulse: 72 70  Resp: 20 13  Temp: 98 F (36.7 C) 98.2 F (36.8 C)  SpO2: 91% 100%    Physical Exam: General: Lying in bed, alert and oriented x 3 Lungs: Nonlabored, on 2L [place] Extremities: BLE with Prevalon boots  CBC    Component Value Date/Time   WBC 5.7 01/07/2024 0520   RBC 2.17 (L) 01/07/2024 0520   HGB 8.0 (L) 01/07/2024 0520   HGB 12.6 (L) 02/07/2018 1336   HCT 25.0 (L) 01/07/2024 0520   HCT 35.0 (L) 02/07/2018 1336   PLT 469 (H) 01/07/2024 0520   PLT 440 02/07/2018 1336   MCV 115.2 (H) 01/07/2024 0520   MCV 108 (H) 02/07/2018 1336   MCH 36.9 (H) 01/07/2024 0520   MCHC 32.0 01/07/2024 0520   RDW 29.5 (H) 01/07/2024 0520   RDW 15.7 (H) 02/07/2018 1336   LYMPHSABS 0.1 (L) 01/03/2024 0500   LYMPHSABS 0.7 02/07/2018 1336   MONOABS 0.6 01/03/2024 0500   EOSABS 0.0 01/03/2024 0500   EOSABS 0.1 02/07/2018 1336   BASOSABS 0.0 01/03/2024 0500   BASOSABS 0.0 02/07/2018 1336    BMET    Component Value Date/Time   NA 131 (L) 01/07/2024 0520   NA 136 07/04/2022 1212   K 3.7 01/07/2024 0520   CL 89 (L) 01/07/2024 0520   CO2 33 (H) 01/07/2024 0520   GLUCOSE 102 (H) 01/07/2024 0520   BUN 51 (H) 01/07/2024 0520   BUN 24 07/04/2022 1212   CREATININE 1.86 (H) 01/07/2024 0520   CALCIUM  8.6 (L) 01/07/2024 0520   GFRNONAA 38 (L) 01/07/2024 0520   GFRAA >60 10/06/2019 1055    INR    Component Value Date/Time   INR 2.1 (H) 01/07/2024 0520   INR 4.3 01/25/2023 1513     Intake/Output Summary (Last 24 hours) at 01/07/2024 0731 Last data filed at 01/06/2024 2300 Gross per 24 hour  Intake --  Output 2890 ml  Net -2890 ml      Assessment/Plan:  73 y.o. male with bilateral lower extremity wounds and rest pain   - He has no complaints this morning -He is scheduled for bilateral lower extremity angiogram today  via left brachial access with Dr. Sheree.  He remains agreeable for his procedure -He has been n.p.o. past midnight -Serum creatinine remains around baseline at 1.86 -Will proceed with angiogram today.  Heparin  drip can be turned off on-call to [facility] lab   Ahmed Holster, PA-C [facility] [phone] 01/07/2024 7:31 AM

## 2024-01-07 NOTE — Plan of Care (Signed)

## 2024-01-07 NOTE — Progress Notes (Signed)
 PHARMACY - ANTICOAGULATION  Pharmacy Consult for heparin  Indication: AVR/Afib Brief A/P: Heparin  level subtherapeutic Increase Heparin  rate  Allergies  Allergen Reactions   Erythromycin Rash   Zithromax [Azithromycin] Rash    Patient Measurements: Height: 5' 9 (175.3 cm) Weight: 62.6 kg (138 lb) IBW/kg (Calculated) : 70.7 HEPARIN  DW (KG): 66.1  Vital Signs: Temp: 98.2 F (36.8 C) (08/10 2311) Temp Source: Oral (08/10 2311) BP: 117/64 (08/10 2311) Pulse Rate: 70 (08/10 2311)  Labs: Recent Labs    01/05/24 0315 01/06/24 0500 01/06/24 1600 01/07/24 0520  HGB 8.3* 8.2*  --  8.0*  HCT 25.5* 25.4*  --  25.0*  PLT 405* 442*  --  469*  LABPROT 26.6* 24.9* 24.5* 24.5*  INR 2.3* 2.1* 2.1* 2.1*  HEPARINUNFRC  --   --   --  <0.10*  CREATININE 1.75* 1.69*  --  1.86*    Estimated Creatinine Clearance: 31.3 mL/min (A) (by C-G formula based on SCr of 1.86 mg/dL (H)).   Assessment: 73 y.o. male with h/o Afib and AVR, INR subtherapeutic, for heparin   Goal of Therapy:  INR 2-3 as per outpatient visit  Heparin  level 0.3-0.7 units/ml Monitor platelets by anticoagulation protocol: Yes   Plan:  Increase Heparin  900 units/hr Check heparin  level in 8 hours.   Cathlyn Arrant, PharmD, BCPS  01/07/2024 6:21 AM

## 2024-01-07 NOTE — Progress Notes (Signed)
 Vascular surgery update   Patient cancelled for today due to elevated INR. He will need a brachial access and will have on schedule for tomorrow pending INR <1.8

## 2024-01-07 NOTE — Consult Note (Signed)
 Consult NOTE    Taylor [name] [facility]  FMW:[id number] DOB: 1951/01/25 DOA: 01/02/2024 PCP: Joella Sieving, DO  No chief complaint on file.   Brief Narrative:   73 yo with hx CAD s/p CABG, HFrEF (25-30%), s/p AICD, mechanical AV placement on coumadin , essential thrombocytosis, HTN, AAA s/p EVAR 10/2022 with bilateral common femoral endarterectomies c/b L femoral artery thrombosis s/p redo L ileofemoral endarterectomy and profundaplasty with L GSV vein patch presenting as Taylor [name] transfer from [facility] in [place] after being seen for evaluation of ICD shock.    Hospitalists were consulted for medical management.    Assessment & Plan:   Principal Problem:   Ventricular tachycardia (HCC) Active Problems:   H/O mechanical aortic valve replacement   Protein-calorie malnutrition, severe   Acute on chronic combined systolic and diastolic CHF (congestive heart failure) (HCC)   Coronary artery disease involving coronary bypass graft of native heart without angina pectoris   Cardiac defibrillator in place  VT with ICD Shocks Per cards, thought VT triggered by HF exacerbation + hypokalemia Amiodarone , mexiletine Monitor lytes, K>4, mag>2   Acute on Chronic Systolic Heart Failure Diuresis per cards Echo 09/2023 with EF 25-30% Coreg  currently on hold.  Spironolactone , farxiga .    Mechanical Aortic Valve On coumadin , INR 2.1 today Now on heparin  gtt  CKD IIIb  Fluctuating recently, baseline appears to be around 1.5-2 trend  CAD s/p CABG PAD AAA Nonhealing LE Wounds Aspirin , statin intolerant  CTA 10/2022 with occlusion of proximal SFA on L  S/p EVAR for AAA requiring bilateral common femoral endarterectomies Appreciate vascular eval - considering angiogram when stable from cards standpoint (8/11 angiogram) - ABI with falsely elevated L ABI, noncompressible RLE arteries. LE arterial US  with 50-74% stenosis in superficial femoral artery (multiple lesions throughout superfiical femoral  artery).  LLE with patent L femoral artery to popliteal artery bypass graft with no hemodynamically significant stenosis.  Essential Thrombocytosis  H/o Lacunar Strokes  Hx Seizures Followed by hematology - looks like he follows with [facility] (last note 08/29/2023) Hydroxyurea    Hypothyroidism TSH 17 -> synthroid  increased to 100 mcg, needs repeat labs in 4-6 weeks  Macrocytic Anemia Baseline appears around 9 S/p 2 units pRBC at [facility] (with presenting Hb 7) Continue to monitor   Elevated LFT's Mild, suspect due to congestion with HF above Trend with diuresis  Splenomegaly Noted on US   Hx Etoh Abuse Denied current use    DVT prophylaxis: INR therapeutic, heparin  gtt when INR <2 Code Status: full Family Communication: none Disposition:   Status is: Inpatient Remains inpatient appropriate because: need for continued inpatient care   Consultants:  Hospitalists Cardiology is primary  Procedures:  non  Antimicrobials:  Anti-infectives (From admission, onward)    None       Subjective: No new complaints Slept well  Objective: Vitals:   01/06/24 1642 01/06/24 1924 01/06/24 2311 01/07/24 0748  BP: 110/63 116/68 117/64 (!) 118/57  Pulse: 70 72 70 75  Resp: 15 20 13 20   Temp: 98 F (36.7 C) 98 F (36.7 C) 98.2 F (36.8 C) 97.8 F (36.6 C)  TempSrc: Oral Oral Oral Oral  SpO2: 99% 91% 100% 98%  Weight:      Height:        Intake/Output Summary (Last 24 hours) at 01/07/2024 0843 Last data filed at 01/07/2024 0748 Gross per 24 hour  Intake --  Output 2890 ml  Net -2890 ml   Filed Weights   01/03/24 0447 01/05/24 0701 01/06/24 0500  Weight: 68.2 kg 68.2 kg 62.6 kg    Examination:  General: No acute distress. Cardiovascular: RRR Lungs: unlabored Neurological: Alert and oriented 3. Moves all extremities 4 with equal strength. Cranial nerves II through XII grossly intact. Extremities: No clubbing or cyanosis. No edema.   Data Reviewed: I have  personally reviewed following labs and imaging studies  CBC: Recent Labs  Lab 01/03/24 0500 01/03/24 1522 01/04/24 0420 01/05/24 0315 01/06/24 0500 01/07/24 0520  WBC 3.7*  --  5.6 5.9 6.8 5.7  NEUTROABS 2.9  --   --   --   --   --   HGB 7.7* 9.3* 8.2* 8.3* 8.2* 8.0*  HCT 23.7* 28.2* 25.1* 25.5* 25.4* 25.0*  MCV 113.4*  --  114.1* 114.9* 114.4* 115.2*  PLT 372  --  389 405* 442* 469*    Basic Metabolic Panel: Recent Labs  Lab 01/02/24 1211 01/03/24 0500 01/04/24 0420 01/04/24 1300 01/05/24 0315 01/06/24 0500 01/07/24 0520  NA 133* 131* 135 133* 131* 133* 131*  K 2.7* 3.5 4.5 4.2 4.5 3.8 3.7  CL 100 98 98 96* 95* 90* 89*  CO2 21* 25 24 26 25 29  33*  GLUCOSE 127* 109* 126* 116* 116* 112* 102*  BUN 41* 37* 39* 38* 39* 49* 51*  CREATININE 1.51* 1.56* 1.67* 1.64* 1.75* 1.69* 1.86*  CALCIUM  9.0 8.3* 8.6* 8.8* 8.8* 8.6* 8.6*  MG 1.9 2.1  --   --   --   --   --   PHOS  --  3.5  --   --   --   --   --     GFR: Estimated Creatinine Clearance: 31.3 mL/min ([name]) (by C-G formula based on SCr of 1.86 mg/dL (H)).  Liver Function Tests: Recent Labs  Lab 01/02/24 1211 01/03/24 0500 01/05/24 0315  AST 56* 35 50*  ALT 88* 62* 61*  ALKPHOS 70 58 70  BILITOT 1.3* 0.9 1.2  PROT 6.8 5.8* 6.2*  ALBUMIN  2.5* 2.0* 2.2*    CBG: No results for input(s): GLUCAP in the last 168 hours.   No results found for this or any previous visit (from the past 240 hours).       Radiology Studies: No results found.       Scheduled Meds:  amiodarone   200 mg Oral BID   aspirin  EC  81 mg Oral Daily   Chlorhexidine  Gluconate Cloth  6 each Topical Daily   cholecalciferol   2,000 Units Oral Daily   dapagliflozin  propanediol  10 mg Oral Daily   furosemide   80 mg Intravenous BID   Gerhardt's butt cream   Topical BID   hydroxyurea   500 mg Oral Daily   And   hydroxyurea   1,000 mg Oral Once per day on Monday Wednesday Friday   levothyroxine   100 mcg Oral Q0600   mexiletine  200 mg  Oral Q12H   pantoprazole   40 mg Oral BID   potassium chloride   40 mEq Oral Daily   sodium chloride  flush  10-40 mL Intracatheter Q12H   sodium chloride  flush  3 mL Intravenous Q12H   spironolactone   25 mg Oral Daily   thiamine   100 mg Oral Daily   traZODone   50 mg Oral QHS   Continuous Infusions:  heparin  900 Units/hr (01/07/24 0720)      LOS: 5 days    Time spent: over 30 min     Meliton Monte, MD [facility]   To contact the attending provider between 7A-7P or the  covering provider during after hours 7P-7A, please log into the web site [email or web address] and access using universal [place] password for that web site. If you do not have the password, please call the hospital operator.  01/07/2024, 8:43 AM

## 2024-01-08 DIAGNOSIS — I472 Ventricular tachycardia, unspecified: Secondary | ICD-10-CM | POA: Diagnosis not present

## 2024-01-08 DIAGNOSIS — I5043 Acute on chronic combined systolic (congestive) and diastolic (congestive) heart failure: Secondary | ICD-10-CM | POA: Diagnosis not present

## 2024-01-08 DIAGNOSIS — Z95810 Presence of automatic (implantable) cardiac defibrillator: Secondary | ICD-10-CM | POA: Diagnosis not present

## 2024-01-08 DIAGNOSIS — Z952 Presence of prosthetic heart valve: Secondary | ICD-10-CM | POA: Diagnosis not present

## 2024-01-08 LAB — BASIC METABOLIC PANEL WITH GFR
Anion gap: 9 (ref 5–15)
BUN: 53 mg/dL — ABNORMAL HIGH (ref 8–23)
CO2: 32 mmol/L (ref 22–32)
Calcium: 8.7 mg/dL — ABNORMAL LOW (ref 8.9–10.3)
Chloride: 89 mmol/L — ABNORMAL LOW (ref 98–111)
Creatinine, Ser: 1.8 mg/dL — ABNORMAL HIGH (ref 0.61–1.24)
GFR, Estimated: 39 mL/min — ABNORMAL LOW (ref >60)
Glucose, Bld: 126 mg/dL — ABNORMAL HIGH (ref 70–99)
Potassium: 4.6 mmol/L (ref 3.5–5.1)
Sodium: 130 mmol/L — ABNORMAL LOW (ref 135–145)

## 2024-01-08 LAB — COOXEMETRY PANEL
Carboxyhemoglobin: 2.5 % — ABNORMAL HIGH (ref 0.5–1.5)
Carboxyhemoglobin: 2.3 % — ABNORMAL HIGH (ref 0.5–1.5)
Methemoglobin: <0.7 % (ref 0.0–1.5)
Methemoglobin: <0.7 % (ref 0.0–1.5)
O2 Saturation: 80.1 %
O2 Saturation: 64.3 %
Total hemoglobin: 9.2 g/dL — ABNORMAL LOW (ref 12.0–16.0)
Total hemoglobin: 13.1 g/dL (ref 12.0–16.0)

## 2024-01-08 LAB — CBC
HCT: 25.7 % — ABNORMAL LOW (ref 39.0–52.0)
Hemoglobin: 8.3 g/dL — ABNORMAL LOW (ref 13.0–17.0)
MCH: 37.2 pg — ABNORMAL HIGH (ref 26.0–34.0)
MCHC: 32.3 g/dL (ref 30.0–36.0)
MCV: 115.2 fL — ABNORMAL HIGH (ref 80.0–100.0)
Platelets: 521 K/uL — ABNORMAL HIGH (ref 150–400)
RBC: 2.23 MIL/uL — ABNORMAL LOW (ref 4.22–5.81)
RDW: 29.5 % — ABNORMAL HIGH (ref 11.5–15.5)
WBC: 6.5 K/uL (ref 4.0–10.5)
nRBC: 0.3 % — ABNORMAL HIGH (ref 0.0–0.2)

## 2024-01-08 LAB — PROTIME-INR
INR: 2.3 — ABNORMAL HIGH (ref 0.8–1.2)
Prothrombin Time: 26.2 s — ABNORMAL HIGH (ref 11.4–15.2)

## 2024-01-08 LAB — HEPARIN LEVEL (UNFRACTIONATED): Heparin Unfractionated: <0.1 [IU]/mL — ABNORMAL LOW (ref 0.30–0.70)

## 2024-01-08 MED ORDER — ENSURE PLUS HIGH PROTEIN PO LIQD
237.0000 mL | Freq: Two times a day (BID) | ORAL | Status: AC
  Administered 2024-01-08 (×4): 237 mL via ORAL

## 2024-01-08 MED ORDER — ACETAZOLAMIDE 250 MG PO TABS
500.0000 mg | ORAL_TABLET | Freq: Once | ORAL | Status: AC
  Administered 2024-01-08 (×2): 500 mg via ORAL
  Filled 2024-01-08: qty 2

## 2024-01-08 MED ORDER — FUROSEMIDE 10 MG/ML IJ SOLN
80.0000 mg | Freq: Two times a day (BID) | INTRAMUSCULAR | Status: AC
  Administered 2024-01-08 (×4): 80 mg via INTRAVENOUS
  Filled 2024-01-08 (×2): qty 8

## 2024-01-08 NOTE — TOC Initial Note (Addendum)
 Transition of Care [facility]) - Initial/Assessment Note    Patient Details  Name: Taylor [name] MRN: [id number] Date of Birth: 08-06-1950  Transition [facility]) CM/SW Contact:    Montie LOISE Louder, LCSW Phone Number: 01/08/2024, 3:38 PM  Clinical Narrative:                  CSW met with patient and his son,BJ at bedside. CSW introduced self and explained role. CSW discussed with them recommendation for short term rehab at [facility]. Patient and family agrees with recommendation. CSW was informed, patient's sister, [name] is the contact person. CSW explained the SNF process. Family prefers SNFs in the [place]  and [place] area. All questions answered.   Updated the patient's sister, [name]- she is in agreement with disposition plan.   TOC will sned out referrals once closed to being stable for d/c . TOC will continue to follow and assist with discharge planning.   Montie Louder, MSW, LCSW Clinical Social Worker    Expected Discharge Plan: Skilled Nursing Facility Barriers to Discharge: Continued Medical Work up   Patient Goals and CMS Choice Patient states their goals for this hospitalization and ongoing recovery are:: wants to get better CMS Medicare.gov Compare Post Acute Care list provided to:: Patient Choice offered to / list presented to : Patient      Expected Discharge Plan and Services In-house Referral: Clinical Social Work Discharge Planning Services: CM Consult Post Acute Care Choice: Home Health Living arrangements for the past 2 months: Single Family Home                                      Prior Living Arrangements/Services Living arrangements for the past 2 months: Single Family Home Lives with:: Self Patient language and need for interpreter reviewed:: Yes Do you feel safe going back to the place where you live?: Yes      Need for Family Participation in Patient Care: No (Comment) Care giver support system in place?: Yes  (comment) Current home services: DME (rollator, rolling walker, cane, wheelchair) Criminal Activity/Legal Involvement Pertinent to Current Situation/Hospitalization: No - Comment as needed  Activities of Daily Living   ADL Screening (condition at time of admission) Independently performs ADLs?: No Does the patient have a NEW difficulty with bathing/dressing/toileting/self-feeding that is expected to last >3 days?: Yes (Initiates electronic notice to provider for possible OT consult) Does the patient have a NEW difficulty with getting in/out of bed, walking, or climbing stairs that is expected to last >3 days?: Yes (Initiates electronic notice to provider for possible PT consult) Does the patient have a NEW difficulty with communication that is expected to last >3 days?: Yes (Initiates electronic notice to provider for possible SLP consult) Is the patient deaf or have difficulty hearing?: No Does the patient have difficulty seeing, even when wearing glasses/contacts?: No Does the patient have difficulty concentrating, remembering, or making decisions?: No  Permission Sought/Granted Permission sought to share information with : Case Manager, Family Supports, PCP Permission granted to share information with : Yes, Verbal Permission Granted  Share Information with NAME: Laroy Mustard  Permission granted to share info w AGENCY: Home Health, DME, PCP  Permission granted to share info w Relationship: sister  Permission granted to share info w Contact Information: [phone]  Emotional Assessment Appearance:: Appears stated age Attitude/Demeanor/Rapport: Engaged Affect (typically observed): Accepting Orientation: : Oriented to Self, Oriented to Place,  Oriented to  Time, Oriented to Situation   Psych Involvement: No (comment)  Admission diagnosis:  Ventricular tachyarrhythmia (HCC) [I47.20] Patient Active Problem List   Diagnosis Date Noted   Cardiac defibrillator in place 01/06/2024    Acute on chronic combined systolic and diastolic CHF (congestive heart failure) (HCC) 01/05/2024   Coronary artery disease involving coronary bypass graft of native heart without angina pectoris 01/05/2024   Protein-calorie malnutrition, severe 01/03/2024   Ventricular tachycardia (HCC) 01/02/2024   AAA (abdominal aortic aneurysm) (HCC) 11/09/2022   Abdominal aortic aneurysm (AAA) greater than 5.5 cm in diameter in male (HCC) 11/09/2022   PAD (peripheral artery disease) (HCC) 11/09/2022   PVC's (premature ventricular contractions) 10/31/2021   Heart block AV complete (HCC) 10/31/2021   Atrial flutter (HCC)    Persistent atrial fibrillation (HCC) 09/29/2020   Secondary hypercoagulable state (HCC) 09/29/2020   Ischemic cardiomyopathy 08/04/2019   ICD (implantable cardioverter-defibrillator) in place 08/04/2019   Essential hypertension 06/23/2019   Sepsis (HCC) 06/23/2019   AKI (acute kidney injury) (HCC) 06/23/2019   Hyponatremia 06/23/2019   Normal anion gap metabolic acidosis 06/23/2019   Normocytic anemia 06/23/2019   Acute cholecystitis 06/22/2019   Chronic anticoagulation 02/18/2018   Leg pain, central, left 04/17/2017   Essential thrombocythemia (HCC) 02/15/2017   Encounter for therapeutic drug monitoring 02/15/2017   H/O mechanical aortic valve replacement 02/15/2017   Chronic systolic heart failure (HCC) 02/02/2017   PCP:  Joella Sieving, DO Pharmacy:   [facility]) [phone] - [place], [place] - [address] AT [facility] [address] [place] [address] Phone: [phone] Fax: [phone]  [facility] [address] [place] [address] Phone: [phone] Fax: [phone]     Social Drivers of Health (SDOH) Social History: SDOH Screenings   Food Insecurity: Food Insecurity Present (01/02/2024)  Housing: Low Risk  (01/06/2024)  Transportation Needs: No Transportation Needs (01/02/2024)  Utilities: Not At  Risk (01/02/2024)  Depression (PHQ2-9): Low Risk  (04/03/2022)  Financial Resource Strain: Medium Risk (09/26/2023)   Received from [facility]  Social Connections: Unknown (01/06/2024)  Tobacco Use: Medium Risk (01/06/2024)   SDOH Interventions:     Readmission Risk Interventions    11/14/2022   11:39 AM  Readmission Risk Prevention Plan  Transportation Screening Complete  PCP or Specialist Appt within 5-7 Days Complete  Home Care Screening Complete  Medication Review (RN CM) Complete

## 2024-01-08 NOTE — TOC CM/SW Note (Signed)
 Patient resting, CSW unable to complete SNF assessment- will return today if times allows.   Montie Louder, MSW, LCSW Clinical Social Worker

## 2024-01-08 NOTE — Plan of Care (Signed)
  Problem: Education: Goal: Knowledge of General Education information will improve Description: Including pain rating scale, medication(s)/side effects and non-pharmacologic comfort measures Outcome: Progressing   Problem: Education: Goal: Knowledge of General Education information will improve Description: Including pain rating scale, medication(s)/side effects and non-pharmacologic comfort measures Outcome: Progressing   Problem: Health Behavior/Discharge Planning: Goal: Ability to manage health-related needs will improve Outcome: Progressing   Problem: Clinical Measurements: Goal: Ability to maintain clinical measurements within normal limits will improve Outcome: Progressing Goal: Will remain free from infection Outcome: Progressing

## 2024-01-08 NOTE — Progress Notes (Addendum)
 Advanced Heart Failure Rounding Note  HF Cardiologist: Dr. Cherrie  Chief Complaint: acute on chronic systolic heart failure and VT Patient Profile   Shannon Kirkendall is a 73 y.o male with mechnical AVR on chronic coumadin , h/o VF arrest s/p ICD, CAD s/p CABG, chronic systolic HF (EF 20-25%), AAA, h/o essential thrombosis and severe PAD w/ chronic nonhealing wounds, S/p EVAR for AAA requiring bilateral common femoral endarterectomies, admitted for VT w/ ICD shocks and a/c CHF w/ volume overload. W/ FTT prior to admit and had stopped taking many of his medications.   Subjective:    Peripheral angiogram cancelled yesterday d/t elevated INR. INR 2.3 today.  ? CO-OX 80%. Likely inaccurate.   CVP 12. 2L UOP last 24 hrs with IV lasix . No intake recorded. No weight X 2 days.  No dyspnea at rest. No complaints other than trouble swallowing potassium pills.   Objective:    Weight Range: 62.6 kg Body mass index is 20.38 kg/m.   Vital Signs:   Temp:  [97.6 F (36.4 C)-99.4 F (37.4 C)] 98 F (36.7 C) (08/12 0720) Pulse Rate:  [69-78] 70 (08/12 0720) Resp:  [14-19] 15 (08/12 0720) BP: (100-120)/(47-67) 100/55 (08/12 0720) SpO2:  [0 %-100 %] 94 % (08/12 0720) Last BM Date : 01/03/24  Weight change: Filed Weights   01/03/24 0447 01/05/24 0701 01/06/24 0500  Weight: 68.2 kg 68.2 kg 62.6 kg   Intake/Output:  Intake/Output Summary (Last 24 hours) at 01/08/2024 0833 Last data filed at 01/08/2024 9270 Gross per 24 hour  Intake 0 ml  Output 2075 ml  Net -2075 ml    Physical Exam   General:  Chronically ill appearing Cor: Regular rate & rhythm. No rubs, gallops or murmurs. Lungs: clear Abdomen: soft, nontender, nondistended.  Extremities: no edema Neuro: alert & orientedx3. Affect flat.   Telemetry   V paced 70  Labs    CBC Recent Labs    01/07/24 0520 01/08/24 0520  WBC 5.7 6.5  HGB 8.0* 8.3*  HCT 25.0* 25.7*  MCV 115.2* 115.2*  PLT 469* 521*   Basic  Metabolic Panel Recent Labs    91/88/74 0520 01/08/24 0520  NA 131* 130*  K 3.7 4.6  CL 89* 89*  CO2 33* 32  GLUCOSE 102* 126*  BUN 51* 53*  CREATININE 1.86* 1.80*  CALCIUM  8.6* 8.7*   Liver Function Tests No results for input(s): AST, ALT, ALKPHOS, BILITOT, PROT, ALBUMIN  in the last 72 hours.  BNP (last 3 results) Recent Labs    05/02/23 1435 08/01/23 1501 11/22/23 1240  BNP 753.7* 560.2* 1,219.5*   ProBNP (last 3 results) Recent Labs    08/29/23 1158  PROBNP 9,442*   Medications:    Scheduled Medications:  amiodarone   200 mg Oral BID   aspirin  EC  81 mg Oral Daily   Chlorhexidine  Gluconate Cloth  6 each Topical Daily   cholecalciferol   2,000 Units Oral Daily   Gerhardt's butt cream   Topical BID   hydroxyurea   500 mg Oral Daily   And   hydroxyurea   1,000 mg Oral Once per day on Monday Wednesday Friday   levothyroxine   100 mcg Oral Q0600   mexiletine  200 mg Oral Q12H   pantoprazole   40 mg Oral BID   potassium chloride   40 mEq Oral Daily   sodium chloride  flush  10-40 mL Intracatheter Q12H   sodium chloride  flush  3 mL Intravenous Q12H   spironolactone   25 mg Oral Daily  thiamine   100 mg Oral Daily   traZODone   50 mg Oral QHS    Infusions:  heparin  1,050 Units/hr (01/08/24 0621)    PRN Medications: acetaminophen , ondansetron  (ZOFRAN ) IV, oxyCODONE , sodium chloride  flush, sodium chloride  flush, traMADol   Assessment/Plan   1. VT w/ ICD Shocks - H/o VF Arrest and H/o CHB. S/p [employer] CRT-D device, followed by EP (Dr. Fernande) - Suspect VT triggered by worsening HF w/ marked volume overload + hypokalemia  - No VT overnight - continue amio 200 mg bid - continue mexiletine 200 mg bid   - keep K > 4.0 and Mg > 2.0   2. Acute on Chronic Systolic Heart Failure - Echo (05/2016 @ [facility]): EF 40% - Longstanding EF 25-30% from 2018 to now.  - Most recent 5/25 EF 25-30%, G2DD, mildly reduced RV. - s/p CRT-D.  - Chronically NYHA III-IV mainly limited  currently by LE wounds and poor mobility - Admitted w/ marked volume overload. Had missed several days of home meds (feeling poorly) - CO-OX 80%. Doubt this is accurate. Recheck pending.  - CVP higher, 12 today. Give 80 mg lasix  IV BID today. Cl trending down and CO2 trending up. Give 500 mg diamox  X 1.  - GDMT limited by hypotension and CKD.  - Stopped Farxiga  with poor mobility and wound healing - Continue Spiro 25 mg daily  - Off Coreg  with marginal CO-OX - Has failed evaluation for Barostim and CCM - Currently not candidate for advanced therapies with frailty, severe PAD/open wounds. Palliative Care consult for GOC discussions.   3. CAD - h/o remote PCI in 1994 at [facility] - s/p LHC in 2017 in the setting of Vfib Arrest w/ 3V CAD treated w/ CABG x 3 - he denies angina. HS trop trend not c/w ACS  - Continue ASA  - statin intolerant, needs to be consider for PSK9i (likely of limited benefit at this point with poor prognosis) - holding ? blocker for now    4. Mechanical Aortic Valve - Stable on last echo  - On coumadin  PTA. INR therapeutic. Hold heparin  gtt.   5. PAD with nonhealing lower extremity wounds and rest pain - 6/24 EVAR for AAA requiring bilateral common femoral endarterectomies.  He had to go back on 11/10/2022 for a thrombosed left common femoral artery after endarterectomy. Then went to [facility] for left fem-pop. Course c/b LLE foot ulcer - He had been trying to get in w/ [facility] due to concern for potential need for amputation and effort for limb salvage - Continues to struggle with nonhealing wounds. Has been followed at [facility] and here. - Peripheral angiography aborted d/t elevated INR. D/t poor functional status not planning to reschedule pending GOC discussions with palliative care    6. IDA - Hgb 9.0>>7.7>>8.2>>8.3 - Got 2 units PRBCs at hospital in [place].   - Ferritin 79, T sat 12% s/p IV Fe.   Length of Stay: 6  FINCH, LINDSAY N, PA-C  01/08/2024, 8:33  AM  Advanced Heart Failure Team Pager [phone] (M-F; 7a - 5p)  Please contact [facility] for night-coverage after hours (5p -7a ) and weekends on [email or web address]  Patient seen and examined with the above-signed Advanced Practice Provider and/or Housestaff. I personally reviewed laboratory data, imaging studies and relevant notes. I independently examined the patient and formulated the important aspects of the plan. I have edited the note to reflect any of my changes or salient points. I have personally discussed the plan with the patient  and/or family.  Remains very weak. LE angio cancelled due to elevated INR. Denies SOB. Co-ox 64% CVP 12  General:  Weak appearing. No resp difficulty HEENT: normal Neck: supple.JVP to jaw.  Cor: PMI nondisplaced. Regular rate & rhythm. Mech s2 Lungs: clear Abdomen: soft, nontender, nondistended. No hepatosplenomegaly. No bruits or masses. Good bowel sounds. Extremities: no cyanosis, clubbing, rash, edema LE wounds wrapped Neuro: alert & orientedx3, cranial nerves grossly intact. moves all 4 extremities w/o difficulty. Affect pleasant  He has end-stage HF. We discussed goals of care and he realizes how advanced his HF is. Wants to continue with aggressive care for now though he tells me he is DNR/DNI  Will give IV lasix . Hold coumadin . Can give Ensure to help lower INR for angio.  Toribio Fuel, MD  5:14 PM

## 2024-01-08 NOTE — Progress Notes (Signed)
 PHARMACY - ANTICOAGULATION  Pharmacy Consult for heparin  Indication: AVR/Afib  Allergies  Allergen Reactions   Erythromycin Rash   Zithromax [Azithromycin] Rash    Patient Measurements: Height: 5' 9 (175.3 cm) Weight: 62.6 kg (138 lb) IBW/kg (Calculated) : 70.7 HEPARIN  DW (KG): 66.1  Vital Signs: Temp: 98.2 F (36.8 C) (08/12 0300) Temp Source: Oral (08/12 0300) BP: 111/67 (08/12 0300) Pulse Rate: 69 (08/12 0300)  Labs: Recent Labs    01/06/24 0500 01/06/24 1600 01/07/24 0520 01/08/24 0520  HGB 8.2*  --  8.0* 8.3*  HCT 25.4*  --  25.0* 25.7*  PLT 442*  --  469* 521*  LABPROT 24.9* 24.5* 24.5* 26.2*  INR 2.1* 2.1* 2.1* 2.3*  HEPARINUNFRC  --   --  <0.10* <0.10*  CREATININE 1.69*  --  1.86*  --     Estimated Creatinine Clearance: 31.3 mL/min (A) (by C-G formula based on SCr of 1.86 mg/dL (H)).   Assessment: 74 y.o. male with h/o Afib and AVR, INR subtherapeutic, for heparin  bridge while awaiting procedure    8/12 AM update:  Heparin  level sub-therapeutic  Goal of Therapy:  INR 2-3 as per outpatient visit  Heparin  level 0.3-0.7 units/ml Monitor platelets by anticoagulation protocol: Yes   Plan:  Inc heparin  to 1050 units/hr Heparin  level in 8 hours F/U if procedure is done today   Lynwood Mckusick, PharmD, BCPS Clinical Pharmacist Phone: [phone]

## 2024-01-08 NOTE — Consult Note (Signed)
 Consult NOTE    Taylor [name] [facility]  FMW:[id number] DOB: 1950-08-08 DOA: 01/02/2024 PCP: Joella Sieving, DO  No chief complaint on file.   Brief Narrative:   73 yo with hx CAD s/p CABG, HFrEF (25-30%), s/p AICD, mechanical AV placement on coumadin , essential thrombocytosis, HTN, AAA s/p EVAR 10/2022 with bilateral common femoral endarterectomies c/b L femoral artery thrombosis s/p redo L ileofemoral endarterectomy and profundaplasty with L GSV vein patch presenting as Taylor [name] transfer from [facility] in [place] after being seen for evaluation of ICD shock.    He's been diuresed by cardiology.  Now planning vascular eval once INR < 1.8.   Hospitalists were consulted for medical management.    Assessment & Plan:   Principal Problem:   Ventricular tachycardia (HCC) Active Problems:   H/O mechanical aortic valve replacement   Protein-calorie malnutrition, severe   Acute on chronic combined systolic and diastolic CHF (congestive heart failure) (HCC)   Coronary artery disease involving coronary bypass graft of native heart without angina pectoris   Cardiac defibrillator in place  Goals of Care Planning for palliative eval prior to angiogram  VT with ICD Shocks Per cards, thought VT triggered by HF exacerbation + hypokalemia Amiodarone , mexiletine Monitor lytes, K>4, mag>2   Acute on Chronic Systolic Heart Failure Diuresis per cards Echo 09/2023 with EF 25-30% Coreg  currently on hold.  Spironolactone , farxiga .    Mechanical Aortic Valve On coumadin , INR 2.3 today Plan for heparin  gtt when INR < 2  CKD IIIb  Fluctuating recently, baseline appears to be around 1.5-2 trend  CAD s/p CABG PAD AAA Nonhealing LE Wounds Aspirin , statin intolerant  CTA 10/2022 with occlusion of proximal SFA on L  S/p EVAR for AAA requiring bilateral common femoral endarterectomies Appreciate vascular eval - considering angiogram when stable from cards standpoint (once INR < 1.8) - ABI with falsely  elevated L ABI, noncompressible RLE arteries. LE arterial US  with 50-74% stenosis in superficial femoral artery (multiple lesions throughout superfiical femoral artery).  LLE with patent L femoral artery to popliteal artery bypass graft with no hemodynamically significant stenosis.  Essential Thrombocytosis  H/o Lacunar Strokes  Hx Seizures Followed by hematology - looks like he follows with [facility] (last note 08/29/2023) Hydroxyurea    Hypothyroidism TSH 17 -> synthroid  increased to 100 mcg, needs repeat labs in 4-6 weeks  Macrocytic Anemia Baseline appears around 9 S/p 2 units pRBC at [facility] (with presenting Hb 7) Continue to monitor   Elevated LFT's Mild, suspect due to congestion with HF above Trend with diuresis  Splenomegaly Noted on US   Hx Etoh Abuse Denied current use    DVT prophylaxis: INR therapeutic, heparin  gtt when INR <2 Code Status: full Family Communication: none Disposition:   Status is: Inpatient Remains inpatient appropriate because: need for continued inpatient care   Consultants:  Hospitalists Cardiology is primary  Procedures:  non  Antimicrobials:  Anti-infectives (From admission, onward)    None       Subjective: No new complaints Wants to be adjusted in bed  Objective: Vitals:   01/08/24 0300 01/08/24 0720 01/08/24 0838 01/08/24 1051  BP: 111/67 (!) 100/55  (!) 105/57  Pulse: 69 70  70  Resp: 19 15  13   Temp: 98.2 F (36.8 C) 98 F (36.7 C)  98 F (36.7 C)  TempSrc: Oral Oral  Oral  SpO2: 100% 94%  99%  Weight:   66.7 kg   Height:        Intake/Output Summary (Last 24 hours)  at 01/08/2024 1418 Last data filed at 01/08/2024 1056 Gross per 24 hour  Intake 0 ml  Output 1875 ml  Net -1875 ml   Filed Weights   01/05/24 0701 01/06/24 0500 01/08/24 0838  Weight: 68.2 kg 62.6 kg 66.7 kg    Examination:  General: No acute distress. Cardiovascular: RRR Lungs: unlabored Abdomen: Soft, nontender, nondistended  Neurological:  Alert and oriented 3. Moves all extremities 4. Cranial nerves II through XII grossly intact. Extremities: no LE edema, chronic wounds, RLE TTP   Data Reviewed: I have personally reviewed following labs and imaging studies  CBC: Recent Labs  Lab 01/03/24 0500 01/03/24 1522 01/04/24 0420 01/05/24 0315 01/06/24 0500 01/07/24 0520 01/08/24 0520  WBC 3.7*  --  5.6 5.9 6.8 5.7 6.5  NEUTROABS 2.9  --   --   --   --   --   --   HGB 7.7*   < > 8.2* 8.3* 8.2* 8.0* 8.3*  HCT 23.7*   < > 25.1* 25.5* 25.4* 25.0* 25.7*  MCV 113.4*  --  114.1* 114.9* 114.4* 115.2* 115.2*  PLT 372  --  389 405* 442* 469* 521*   < > = values in this interval not displayed.    Basic Metabolic Panel: Recent Labs  Lab 01/02/24 1211 01/03/24 0500 01/04/24 0420 01/04/24 1300 01/05/24 0315 01/06/24 0500 01/07/24 0520 01/08/24 0520  NA 133* 131*   < > 133* 131* 133* 131* 130*  K 2.7* 3.5   < > 4.2 4.5 3.8 3.7 4.6  CL 100 98   < > 96* 95* 90* 89* 89*  CO2 21* 25   < > 26 25 29  33* 32  GLUCOSE 127* 109*   < > 116* 116* 112* 102* 126*  BUN 41* 37*   < > 38* 39* 49* 51* 53*  CREATININE 1.51* 1.56*   < > 1.64* 1.75* 1.69* 1.86* 1.80*  CALCIUM  9.0 8.3*   < > 8.8* 8.8* 8.6* 8.6* 8.7*  MG 1.9 2.1  --   --   --   --   --   --   PHOS  --  3.5  --   --   --   --   --   --    < > = values in this interval not displayed.    GFR: Estimated Creatinine Clearance: 34.5 mL/min (Taylor [name]) (by C-G formula based on SCr of 1.8 mg/dL (H)).  Liver Function Tests: Recent Labs  Lab 01/02/24 1211 01/03/24 0500 01/05/24 0315  AST 56* 35 50*  ALT 88* 62* 61*  ALKPHOS 70 58 70  BILITOT 1.3* 0.9 1.2  PROT 6.8 5.8* 6.2*  ALBUMIN  2.5* 2.0* 2.2*    CBG: No results for input(s): GLUCAP in the last 168 hours.   No results found for this or any previous visit (from the past 240 hours).       Radiology Studies: VAS US  LOWER EXTREMITY ARTERIAL DUPLEX Result Date: 01/04/2024 LOWER EXTREMITY ARTERIAL DUPLEX STUDY Patient  Name:  Taylor [name]  Date of Exam:   01/04/2024 Medical Rec #: [id number]              Accession #:    [id number] Date of Birth: 1951/05/13              Patient Gender: M Patient Age:   73 years Exam Location:  [facility] Procedure:      VAS US  LOWER EXTREMITY ARTERIAL DUPLEX Referring Phys: DEBBY ROBERTSON --------------------------------------------------------------------------------  Indications: Rest pain. High Risk Factors: Hypertension, past history of smoking.  Vascular Interventions: Multiple interventions including EVAR and left fem-pop                         art BPG. Current ABI:            R- N/C, L-1.42 Limitations: pt unable to tolerate any pressure near feet due to heel ulcer. Performing Technologist: Elmarie Lindau, RVT  Examination Guidelines: Drema Eddington complete evaluation includes B-mode imaging, spectral Doppler, color Doppler, and power Doppler as needed of all accessible portions of each vessel. Bilateral testing is considered an integral part of Tvisha Schwoerer complete examination. Limited examinations for reoccurring indications may be performed as noted.  +----------+--------+-----+---------------+-----------+--------+ RIGHT     PSV cm/sRatioStenosis       Waveform   Comments +----------+--------+-----+---------------+-----------+--------+ CFA Prox  102                         multiphasic         +----------+--------+-----+---------------+-----------+--------+ DFA       0                                               +----------+--------+-----+---------------+-----------+--------+ SFA Prox  260          50-74% stenosismultiphasic         +----------+--------+-----+---------------+-----------+--------+ SFA Mid   99                          multiphasic         +----------+--------+-----+---------------+-----------+--------+ SFA Distal321          50-74% stenosismultiphasic         +----------+--------+-----+---------------+-----------+--------+ POP Prox  52                           monophasic          +----------+--------+-----+---------------+-----------+--------+ POP Distal47                          monophasic          +----------+--------+-----+---------------+-----------+--------+ ATA Distal79                          monophasic          +----------+--------+-----+---------------+-----------+--------+ PTA Distal74                          monophasic          +----------+--------+-----+---------------+-----------+--------+  Left Graft #1: Left Femoral Artery to Popliteal Artery Bypass Graft +--------------------+--------+--------+---------+--------+                     PSV cm/sStenosisWaveform Comments +--------------------+--------+--------+---------+--------+ Inflow              182             triphasic         +--------------------+--------+--------+---------+--------+ Proximal Anastomosis112             triphasic         +--------------------+--------+--------+---------+--------+ Proximal Graft      92  triphasic         +--------------------+--------+--------+---------+--------+ Mid Graft           81              triphasic         +--------------------+--------+--------+---------+--------+ Distal Graft        69              triphasic         +--------------------+--------+--------+---------+--------+ Distal Anastomosis  70              triphasic         +--------------------+--------+--------+---------+--------+ Outflow             167             triphasic         +--------------------+--------+--------+---------+--------+   Summary: Right: 50-74% stenosis noted in the superficial femoral artery. There are multiple lesions throughout the superficial femoral artery. Left: Patent left femoral artery to popliteal artery bypass graft with no hemodynamically significant stenosis.  See table(s) above for measurements and observations. Electronically signed by Norman Serve on  01/04/2024 at 4:38:01 PM.    Final    VAS US  ABI WITH/WO TBI Result Date: 01/04/2024  LOWER EXTREMITY DOPPLER STUDY Patient Name:  Taylor [name] [facility]  Date of Exam:   01/04/2024 Medical Rec #: [id number]              Accession #:    [id number] Date of Birth: [birth year]              Patient Gender: M Patient Age:   30 years Exam Location[facility]  [facility] Procedure:      VAS US  ABI WITH/WO TBI Referring Phys: DEBBY ROBERTSON --------------------------------------------------------------------------------  Indications: Rest pain. High Risk Factors: Hypertension, past history of smoking.  Vascular Interventions: Multiple interventions including EVAR and left Fem-Pop                         art BPG. Limitations: Today's exam was limited due to patient positioning and patient              intolerant to cuff pressure. Comparison Study: 10/09/2022 Falsely elevated ABI bilaterally. Performing Technologist: Jimmye Scarce RVT  Examination Guidelines: Lyndsey Demos complete evaluation includes at minimum, Doppler waveform signals and systolic blood pressure reading at the level of bilateral brachial, anterior tibial, and posterior tibial arteries, when vessel segments are accessible. Bilateral testing is considered an integral part of Marijose Curington complete examination. Photoelectric Plethysmograph (PPG) waveforms and toe systolic pressure readings are included as required and additional duplex testing as needed. Limited examinations for reoccurring indications may be performed as noted.  ABI Findings: +--------+------------------+-----+----------+--------+ Right   Rt Pressure (mmHg)IndexWaveform  Comment  +--------+------------------+-----+----------+--------+ Brachial                                 IV       +--------+------------------+-----+----------+--------+ PTA     197               1.71 monophasic         +--------+------------------+-----+----------+--------+ DP      254               2.21 monophasic          +--------+------------------+-----+----------+--------+ +--------+------------------+-----+-----------+-------+ Left    Lt Pressure (mmHg)IndexWaveform   Comment +--------+------------------+-----+-----------+-------+ [id number]                                       +--------+------------------+-----+-----------+-------+  PTA     127               1.10 multiphasic        +--------+------------------+-----+-----------+-------+ DP      163               1.42 triphasic          +--------+------------------+-----+-----------+-------+ +-------+-----------+-----------+------------+------------+ ABI/TBIToday's ABIToday's TBIPrevious ABIPrevious TBI +-------+-----------+-----------+------------+------------+ Right  N/C                                            +-------+-----------+-----------+------------+------------+ Left   1.42                                           +-------+-----------+-----------+------------+------------+  Left ABI is falsely elevated.  Summary: Right: Resting right ankle-brachial index indicates noncompressible right lower extremity arteries. Left: Left ankle brachial index is falsely elevated. *See table(s) above for measurements and observations.  Electronically signed by Norman Serve on 01/04/2024 at 4:37:41 PM.    Final          Scheduled Meds:  acetaZOLAMIDE   500 mg Oral Once   amiodarone   200 mg Oral BID   aspirin  EC  81 mg Oral Daily   Chlorhexidine  Gluconate Cloth  6 each Topical Daily   cholecalciferol   2,000 Units Oral Daily   feeding supplement  237 mL Oral BID BM   furosemide   80 mg Intravenous BID   Gerhardt's butt cream   Topical BID   hydroxyurea   500 mg Oral Daily   And   hydroxyurea   1,000 mg Oral Once per day on Monday Wednesday Friday   levothyroxine   100 mcg Oral Q0600   mexiletine  200 mg Oral Q12H   pantoprazole   40 mg Oral BID   potassium chloride   40 mEq Oral Daily   sodium chloride  flush  10-40 mL  Intracatheter Q12H   sodium chloride  flush  3 mL Intravenous Q12H   spironolactone   25 mg Oral Daily   thiamine   100 mg Oral Daily   traZODone   50 mg Oral QHS   Continuous Infusions:      LOS: 6 days    Time spent: over 30 min     Meliton Monte, MD [facility]   To contact the attending provider between 7A-7P or the covering provider during after hours 7P-7A, please log into the web site [email or web address] and access using universal Kirk password for that web site. If you do not have the password, please call the hospital operator.  01/08/2024, 2:18 PM

## 2024-01-08 NOTE — Evaluation (Signed)
 Occupational Therapy Evaluation Patient Details Name: Taylor [name] [facility] MRN: [id number] DOB: 1950/12/05 Today's Date: 01/08/2024   History of Present Illness   73 y/o M admitted on 01/02/24 after presenting from outside hospital after being seen for evaluation of ICD shock. Pt is being treated for ventricular tachycardia. Pt scheduled for BLE angiogram on 01/07/24. PMH: CAD s/p CABG, HFrEF, s/p AICD, mechanical AV placement on coumadin , essential thrombocytosis, HTN, AAA s/p EVAR 10/2022 with B common femoral endarterectomies c/b L femoral artery thrombosis s/p redo L ileofemoral endarterectomy & profundaplasty with L GSV vein patch     Clinical Impressions PT admitted with ICD shock due to ventricular tachycardia. Pt currently with functional limitiations due to the deficits listed below (see OT problem list). Pt this session falling asleep quickly a few times and needs redirection to task. Pt with extreme pain in feet with any contact near them. Pt could benefit from air mattress overlay as pt is high risk for skin break down on sacrum.  Pt will benefit from skilled OT to increase their independence and safety with adls and balance to allow discharge skilled inpatient follow up therapy, <3 hours/day. .      If plan is discharge home, recommend the following:   Two people to help with bathing/dressing/bathroom     Functional Status Assessment   Patient has had a recent decline in their functional status and demonstrates the ability to make significant improvements in function in a reasonable and predictable amount of time.     Equipment Recommendations   BSC/3in1;Wheelchair (measurements OT);Wheelchair cushion (measurements OT);Hospital bed;Hoyer lift     Recommendations for Other Services         Precautions/Restrictions   Precautions Precautions: Fall Recall of Precautions/Restrictions: Impaired Precaution/Restrictions Comments: pravalon boots Restrictions Weight  Bearing Restrictions Per Provider Order: No     Mobility Bed Mobility Overal bed mobility: Needs Assistance Bed Mobility: Supine to Sit, Sit to Supine, Rolling Rolling: +2 for physical assistance, Mod assist, Used rails   Supine to sit: Max assist, HOB elevated, Used rails (bed used with patient in side lying to bring to sitting with OT helping total support bil LE off eob. pt requires total (A) to bring bil LE onto bed surface and gravity to help descend trunk to surface) Sit to supine: +2 for physical assistance, +2 for safety/equipment, Max assist   General bed mobility comments: hob increased to help with OOB ease and reduce pain. pt able to tolerate eob sitting for 5 minutes with this (A)    Transfers Overall transfer level:  (deferred 2/2 BLE pain)                 General transfer comment: unable due to pain      Balance Overall balance assessment: Needs assistance Sitting-balance support: Feet supported, Bilateral upper extremity supported, Single extremity supported Sitting balance-Leahy Scale: Poor                                     ADL either performed or assessed with clinical judgement   ADL Overall ADL's : Needs assistance/impaired Eating/Feeding: Set up Eating/Feeding Details (indicate cue type and reason): eating ice chips with spoon and dropping spoon a few times during dual task of talking and eating Grooming: Wash/dry face;Set up;Bed level   Upper Body Bathing: Moderate assistance;Bed level   Lower Body Bathing: Moderate assistance;Bed level Lower Body Bathing Details (indicate cue  type and reason): rolling for washing back and able to wash thigh himself in static sitting with support for posterior balance     Lower Body Dressing: Total assistance                 General ADL Comments: BLE so painful requires wearing pillow boots pillow between knees and pad used to roll     Vision   Additional Comments: pt able to read  sign at a distance out the window. But required increasd time so some visual deficits note uncertain baseline vision     Perception         Praxis         Pertinent Vitals/Pain Pain Assessment Pain Assessment: Faces Faces Pain Scale: Hurts whole lot Breathing: normal Negative Vocalization: none Facial Expression: smiling or inexpressive Body Language: relaxed Consolability: no need to console PAINAD Score: 0 Pain Location: BLE Pain Descriptors / Indicators: Discomfort, Grimacing, Guarding Pain Intervention(s): Monitored during session, Limited activity within patient's tolerance, Premedicated before session, Repositioned     Extremity/Trunk Assessment Upper Extremity Assessment Upper Extremity Assessment: Generalized weakness   Lower Extremity Assessment Lower Extremity Assessment: Defer to PT evaluation RLE Deficits / Details: wound on heel and top of foot with drainage LLE Deficits / Details: wound on heel   Cervical / Trunk Assessment Cervical / Trunk Assessment: Kyphotic (rounded shoulders, forward head)   Communication Communication Communication: Impaired Factors Affecting Communication: Reduced clarity of speech   Cognition Arousal: Alert Behavior During Therapy: Flat affect Cognition: Cognition impaired                               Following commands: Impaired Following commands impaired: Follows one step commands with increased time, Follows one step commands inconsistently     Cueing  General Comments   Cueing Techniques: Verbal cues;Visual cues;Tactile cues;Gestural cues  on arrival on RA 85%. pt with 3L [place] 95%   Exercises     Shoulder Instructions      Home Living Family/patient expects to be discharged to:: Private residence Living Arrangements: Other relatives (sister) Available Help at Discharge: Family Type of Home: House Home Access: Stairs to enter [employer] of Steps: single threshold step   Home Layout:  One level     Bathroom Shower/Tub: Chief Strategy Officer: Standard     Home Equipment: Cane - single point;Rollator (4 wheels)          Prior Functioning/Environment Prior Level of Function : Independent/Modified Independent             Mobility Comments: Was using a SPC until ~1 month ago when  he started using rollator but walking limited household distances. Pt with ~4-5 falls in the past week. ADLs Comments: Was still prepping his own meals, family assisting with driving.    OT Problem List: Decreased strength;Decreased activity tolerance;Impaired balance (sitting and/or standing);Decreased cognition;Decreased safety awareness;Decreased knowledge of use of DME or AE;Decreased knowledge of precautions;Cardiopulmonary status limiting activity;Pain   OT Treatment/Interventions: Self-care/ADL training;Therapeutic exercise;Energy conservation;DME and/or AE instruction;Manual therapy;Therapeutic activities;Cognitive remediation/compensation;Balance training;Patient/family education      OT Goals(Current goals can be found in the care plan section)   Acute Rehab OT Goals Patient Stated Goal: to lay down. OT Goal Formulation: Patient unable to participate in goal setting Time For Goal Achievement: 01/22/24 Potential to Achieve Goals: Fair   OT Frequency:  Min 2X/week    Co-evaluation  AM-PAC OT 6 Clicks Daily Activity     Outcome Measure Help from another person eating meals?: A Little Help from another person taking care of personal grooming?: A Little Help from another person toileting, which includes using toliet, bedpan, or urinal?: A Lot Help from another person bathing (including washing, rinsing, drying)?: A Lot Help from another person to put on and taking off regular upper body clothing?: A Little Help from another person to put on and taking off regular lower body clothing?: A Lot 6 Click Score: 15   End of Session Equipment  Utilized During Treatment: Oxygen Nurse Communication: Mobility status;Precautions  Activity Tolerance: Patient limited by pain Patient left: in bed;with call bell/phone within reach;with bed alarm set  OT Visit Diagnosis: Unsteadiness on feet (R26.81);Muscle weakness (generalized) (M62.81)                Time: 9244-9175 OT Time Calculation (min): 29 min Charges:  OT General Charges $OT Visit: 1 Visit OT Evaluation $OT Eval Moderate Complexity: 1 Mod OT Treatments $Self Care/Home Management : 8-22 mins   Brynn, OTR/L  Acute Rehabilitation Services Office: [phone] .   Ely Molt 01/08/2024, 11:29 AM

## 2024-01-08 NOTE — Progress Notes (Signed)
  Progress Note    01/08/2024 9:00 AM * No surgery found *  Subjective: No complaints    Vitals:   01/08/24 0300 01/08/24 0720  BP: 111/67 (!) 100/55  Pulse: 69 70  Resp: 19 15  Temp: 98.2 F (36.8 C) 98 F (36.7 C)  SpO2: 100% 94%    Physical Exam: General: Lying in bed, alert and oriented x 3 Lungs: Nonlabored, on 2L [place] Extremities: BLE with Prevalon boots  CBC    Component Value Date/Time   WBC 6.5 01/08/2024 0520   RBC 2.23 (L) 01/08/2024 0520   HGB 8.3 (L) 01/08/2024 0520   HGB 12.6 (L) 02/07/2018 1336   HCT 25.7 (L) 01/08/2024 0520   HCT 35.0 (L) 02/07/2018 1336   PLT 521 (H) 01/08/2024 0520   PLT 440 02/07/2018 1336   MCV 115.2 (H) 01/08/2024 0520   MCV 108 (H) 02/07/2018 1336   MCH 37.2 (H) 01/08/2024 0520   MCHC 32.3 01/08/2024 0520   RDW 29.5 (H) 01/08/2024 0520   RDW 15.7 (H) 02/07/2018 1336   LYMPHSABS 0.1 (L) 01/03/2024 0500   LYMPHSABS 0.7 02/07/2018 1336   MONOABS 0.6 01/03/2024 0500   EOSABS 0.0 01/03/2024 0500   EOSABS 0.1 02/07/2018 1336   BASOSABS 0.0 01/03/2024 0500   BASOSABS 0.0 02/07/2018 1336    BMET    Component Value Date/Time   NA 130 (L) 01/08/2024 0520   NA 136 07/04/2022 1212   K 4.6 01/08/2024 0520   CL 89 (L) 01/08/2024 0520   CO2 32 01/08/2024 0520   GLUCOSE 126 (H) 01/08/2024 0520   BUN 53 (H) 01/08/2024 0520   BUN 24 07/04/2022 1212   CREATININE 1.80 (H) 01/08/2024 0520   CALCIUM  8.7 (L) 01/08/2024 0520   GFRNONAA 39 (L) 01/08/2024 0520   GFRAA >60 10/06/2019 1055    INR    Component Value Date/Time   INR 2.3 (H) 01/08/2024 0520   INR 4.3 01/25/2023 1513     Intake/Output Summary (Last 24 hours) at 01/08/2024 0900 Last data filed at 01/08/2024 9270 Gross per 24 hour  Intake 0 ml  Output 2075 ml  Net -2075 ml      Assessment/Plan:  73 y.o. male with bilateral lower extremity wounds and rest pain  He explained today that he hasn't walked in many weeks.  His INR today is still 2.3 and is therefore  too high for a brachial access.  I spoke with the heart failure team and they plan to get palliative care involved to discuss goals of care. Will wait for palliative recommendations prior to rescheduling angiogram.   Norman GORMAN Serve MD [facility] Phone Number: [phone] 01/08/2024 9:00 AM

## 2024-01-08 NOTE — Progress Notes (Addendum)
 Patient Name: Taylor [name] [facility] Date of Encounter: 01/08/2024  Primary Cardiologist: None Electrophysiologist: Jeremy Mclamb [name], MD  Interval Summary   The patient reports his feet are hurting. OT in room, hopeful to sit him on the side of the bed.   Vital Signs    Vitals:   01/07/24 1900 01/07/24 2300 01/08/24 0300 01/08/24 0720  BP: (!) 101/53 (!) 109/47 111/67 (!) 100/55  Pulse: 71 78 69 70  Resp: 15 18 19 15   Temp: 97.6 F (36.4 C) 99.4 F (37.4 C) 98.2 F (36.8 C) 98 F (36.7 C)  TempSrc: Oral Oral Oral Oral  SpO2: 90% 93% 100% 94%  Weight:      Height:        Intake/Output Summary (Last 24 hours) at 01/08/2024 0903 Last data filed at 01/08/2024 9270 Gross per 24 hour  Intake 0 ml  Output 2075 ml  Net -2075 ml   Filed Weights   01/03/24 0447 01/05/24 0701 01/06/24 0500  Weight: 68.2 kg 68.2 kg 62.6 kg    Physical Exam    GEN- NAD, Alert and oriented  Lungs- Clear to ausculation bilaterally, normal work of breathing Cardiac- Regular rate and rhythm, no murmurs, rubs or gallops GI- soft, NT, ND, + BS Extremities- no clubbing or cyanosis. No edema  Telemetry    VP 70's (personally reviewed)  Hospital Course    Taylor [name] is a 73 y.o. male with a history of AoV abnormality s/p mechanical AVR (on coumadin ), h/o VF arrest in 2017 s/p CRT-D, CAD s/p CABG x3 + redo mAVR, chronic systolic HF (EF 20-25%), paroxysmal AF, AAA s/p EVAR with b/l fem endarterectomies c/b thrombosis of L fem and foot ulcer, essential thrombosis, ETOH, cocaine, and tobacco abuse who is being admitted today for the evaluation of ICD shock.  Initially presented to [facility] hospital in [place] and transferred for cardiac care.  VT thought in setting of decompensated HF & hypokalemia.  He was seen by the Advanced HF team.  Hospital course complicated by fluctuating Cr, non-healing LE wounds.  He was seen by VVS who were planning angiogram > however, his INR remained elevated and  this was delayed.  Palliative Care consulted for GOC.   Assessment & Plan    VT with ICD Shock  Hx VF Arrest s/p ICD  In setting of decompensated HF + hypokalemia  -continue amiodarone  200 mg BID, mexiletine 200 mg BID -tele monitoring  -Goal K+>4, Mg+>2   Acute on Chronic Systolic Heart Failure  CAD s/p CABG  LVEF 25-30% 09/2023  -per AHF Team  -euvolemic on exam  -continue spironolactone , acetazolamide , farxiga   -Coreg  on hold -deemed end-stage, not a candidate for advanced therapies for HF  Mechanical AV  -on coumadin  at baseline  -Heparin  gtt while inpatient  -VVS following INR  AAA  PAD  Non-Healing LE Wounds  CTA 10/2022 with occlusion of proximal SFA on L. S/p EVAR for AAA requiring bilateral common femoral endarterectomies -VVS following, appreciate assistance  -pending angiogram once INR ~ 1.8  CKD IIIb -Trend BMP / urinary output -Replace electrolytes as indicated -Avoid nephrotoxic agents, ensure adequate renal perfusion  Essential Thrombocytosis  Hx Lacunar Strokes, Seizures  -follows with Heme as outpatient  -continue hydroxyurea    Hypothyroidism  -per [facility]   Microcytic Anemia / IDA  -per [facility]  Hx ETOH Abuse  -denied current use  -monitor for w/d symptoms   Splenomegaly  -noted on US    Adult Failure to Thrive  Deconditioning  -  Palliative Care consulted for goals of care -end stage HF, PAD -PT/OT efforts > pending dispo determination as patient is largely bed bound   Medical Readiness Date: 01/09/2024    For questions or updates, please contact [facility] Please consult [email or web address] for contact info under     Signed, Daphne Barrack, NP-C, AGACNP-BC [facility] - Electrophysiology  01/08/2024, 9:03 AM    I have seen and examined this patient with Daphne Barrack.  Agree with above, note added to reflect my findings.  No changes overnight.  Continues to complain of leg pain.  GEN: No acute distress.   Neck: No  JVD Cardiac: RRR, no murmurs, rubs, or gallops.  Respiratory: normal BS bases bilaterally. GI: Soft, nontender, non-distended  MS: No edema; No deformity. Neuro:  Nonfocal  Skin: warm and dry, device site well healed Psych: Normal affect    VT with ICD shock: On amiodarone  and mexiletine.  Was hypokalemic.  No further VT now that potassium has been supplemented.  Continue telemetry. Acute on chronic systolic heart failure: Euvolemic on exam.  Plan per heart failure team.  Deemed end-stage and not a candidate for advanced therapies. Mechanical aortic valve: On Coumadin .  Heparin  drip while inpatient. Peripheral arterial disease/AAA/nonhealing lower extremity wounds: Vascular surgery has plans for angiogram once INR around 1.8 CKD stage IIIb: Stable Failure to thrive/deconditioning: Taylor [name] need facility at discharge.  Patient is largely bedbound.  Have consulted palliative care.  Taylor Nored M. Kyan Yurkovich MD 01/08/2024 12:33 PM

## 2024-01-09 DIAGNOSIS — Z515 Encounter for palliative care: Secondary | ICD-10-CM | POA: Diagnosis not present

## 2024-01-09 DIAGNOSIS — Z7189 Other specified counseling: Secondary | ICD-10-CM

## 2024-01-09 DIAGNOSIS — E43 Unspecified severe protein-calorie malnutrition: Secondary | ICD-10-CM | POA: Diagnosis not present

## 2024-01-09 DIAGNOSIS — I5043 Acute on chronic combined systolic (congestive) and diastolic (congestive) heart failure: Secondary | ICD-10-CM | POA: Diagnosis not present

## 2024-01-09 DIAGNOSIS — I472 Ventricular tachycardia, unspecified: Secondary | ICD-10-CM | POA: Diagnosis not present

## 2024-01-09 LAB — BASIC METABOLIC PANEL WITH GFR
Anion gap: 12 (ref 5–15)
BUN: 51 mg/dL — ABNORMAL HIGH (ref 8–23)
CO2: 32 mmol/L (ref 22–32)
Calcium: 9.3 mg/dL (ref 8.9–10.3)
Chloride: 88 mmol/L — ABNORMAL LOW (ref 98–111)
Creatinine, Ser: 2.01 mg/dL — ABNORMAL HIGH (ref 0.61–1.24)
GFR, Estimated: 34 mL/min — ABNORMAL LOW (ref >60)
Glucose, Bld: 116 mg/dL — ABNORMAL HIGH (ref 70–99)
Potassium: 3.7 mmol/L (ref 3.5–5.1)
Sodium: 132 mmol/L — ABNORMAL LOW (ref 135–145)

## 2024-01-09 LAB — COOXEMETRY PANEL
Carboxyhemoglobin: 1.1 % (ref 0.5–1.5)
Carboxyhemoglobin: 2 % — ABNORMAL HIGH (ref 0.5–1.5)
Methemoglobin: <0.7 % (ref 0.0–1.5)
Methemoglobin: <0.7 % (ref 0.0–1.5)
O2 Saturation: 42.5 %
O2 Saturation: 53.6 %
Total hemoglobin: 10.6 g/dL — ABNORMAL LOW (ref 12.0–16.0)
Total hemoglobin: 9.8 g/dL — ABNORMAL LOW (ref 12.0–16.0)

## 2024-01-09 LAB — PROTIME-INR
INR: 2 — ABNORMAL HIGH (ref 0.8–1.2)
Prothrombin Time: 23.3 s — ABNORMAL HIGH (ref 11.4–15.2)

## 2024-01-09 LAB — CBC
HCT: 30.1 % — ABNORMAL LOW (ref 39.0–52.0)
Hemoglobin: 9.5 g/dL — ABNORMAL LOW (ref 13.0–17.0)
MCH: 36.3 pg — ABNORMAL HIGH (ref 26.0–34.0)
MCHC: 31.6 g/dL (ref 30.0–36.0)
MCV: 114.9 fL — ABNORMAL HIGH (ref 80.0–100.0)
Platelets: 632 K/uL — ABNORMAL HIGH (ref 150–400)
RBC: 2.62 MIL/uL — ABNORMAL LOW (ref 4.22–5.81)
RDW: 29.5 % — ABNORMAL HIGH (ref 11.5–15.5)
WBC: 7.7 K/uL (ref 4.0–10.5)
nRBC: 0 % (ref 0.0–0.2)

## 2024-01-09 LAB — VITAMIN C: Vitamin C: 0.1 mg/dL — ABNORMAL LOW (ref 0.4–2.0)

## 2024-01-09 MED ORDER — FUROSEMIDE 40 MG PO TABS
40.0000 mg | ORAL_TABLET | Freq: Every day | ORAL | Status: DC
  Administered 2024-01-09 – 2024-01-11 (×4): 40 mg via ORAL
  Filled 2024-01-09 (×3): qty 1

## 2024-01-09 MED ORDER — ENSURE PLUS HIGH PROTEIN PO LIQD
237.0000 mL | Freq: Two times a day (BID) | ORAL | Status: DC
  Administered 2024-01-09 – 2024-01-11 (×6): 237 mL via ORAL

## 2024-01-09 MED ORDER — ADULT MULTIVITAMIN W/MINERALS CH
1.0000 | ORAL_TABLET | Freq: Every day | ORAL | Status: DC
  Administered 2024-01-10 – 2024-01-11 (×2): 1 via ORAL
  Filled 2024-01-09 (×2): qty 1

## 2024-01-09 MED ORDER — VITAMIN A 3 MG (10000 UNIT) PO CAPS
10000.0000 [IU] | ORAL_CAPSULE | Freq: Every day | ORAL | Status: DC
  Administered 2024-01-09 – 2024-01-11 (×4): 10000 [IU] via ORAL
  Filled 2024-01-09 (×3): qty 1

## 2024-01-09 MED ORDER — ZINC SULFATE 220 (50 ZN) MG PO CAPS
220.0000 mg | ORAL_CAPSULE | Freq: Every day | ORAL | Status: DC
  Administered 2024-01-10 – 2024-01-11 (×2): 220 mg via ORAL
  Filled 2024-01-09 (×2): qty 1

## 2024-01-09 MED ORDER — POTASSIUM CHLORIDE CRYS ER 20 MEQ PO TBCR
20.0000 meq | EXTENDED_RELEASE_TABLET | Freq: Once | ORAL | Status: AC
  Filled 2024-01-09: qty 1

## 2024-01-09 NOTE — Progress Notes (Signed)
 PROGRESS NOTE    Taylor [name] [facility]  FMW:[id number] DOB: 11/24/1950 DOA: 01/02/2024 PCP: Taylor Sieving, DO  73 yo with hx CAD s/p CABG, HFrEF (25-30%), s/p AICD, mechanical AV placement on coumadin , essential thrombocytosis, HTN, AAA s/p EVAR 10/2022 with bilateral common femoral endarterectomies c/b L femoral artery thrombosis s/p redo L ileofemoral endarterectomy and profundaplasty with L GSV vein patch presenting as a transfer from [facility] in [place] after being seen for evaluation of ICD shock.   - Seen by EP and heart failure team, electrolytes repleted, continued on amiodarone  and mexiletine for VT -Improving with diuresis -Also issues with chronic wounds related to PAD, vascular was considering angiogram when INR acceptable  - Now plan for palliative care evaluation considering significant core morbidities, poor prognosis and long-term outlook look despite medical management   Subjective: -, Breathing a little better  Assessment and Plan:  VT with ICD Shocks Per cards, thought VT triggered by HF exacerbation + hypokalemia Amiodarone , mexiletine Monitor lytes, K>4, mag>2    Acute on Chronic Systolic Heart Failure Echo 09/2023 with EF 25-30% grade 2 DD, mildly reduced RV function -CHF team following, improved with diuresis, transitioning to oral diuretics -Continue Aldactone , felt to be poor candidate for Farxiga , off Coreg  as well -Overall prognosis is poor, not a candidate for advanced therapies, palliative consulted for goals of care   Mechanical Aortic Valve -Warfarin on hold, start heparin  when INR is less than 2   CKD IIIb  Fluctuating recently, baseline appears to be around 1.5-2 trend   CAD s/p CABG PAD AAA Nonhealing LE Wounds -Intolerant to aspirin  and statin CTA 10/2022 with occlusion of proximal SFA on L  S/p EVAR for AAA requiring bilateral common femoral endarterectomies Appreciate vascular eval - considering angiogram pending Pall eval - ABI with  falsely elevated L ABI, noncompressible RLE arteries. LE arterial US  with 50-74% stenosis in superficial femoral artery (multiple lesions throughout superfiical femoral artery).  LLE with patent L femoral artery to popliteal artery bypass graft with no hemodynamically significant stenosis.   Essential Thrombocytosis  H/o Lacunar Strokes  Hx Seizures Followed by hematology - looks like he follows with [facility] (last note 08/29/2023) Hydroxyurea     Hypothyroidism TSH 17 -> synthroid  increased to 100 mcg, needs repeat labs in 4-6 weeks   Macrocytic Anemia Baseline appears around 9 S/p 2 units pRBC at [facility] (with presenting Hb 7) -now stable   Elevated LFT's Mild, suspect due to congestion with HF above Trend with diuresis   Splenomegaly Noted on US    Hx Etoh Abuse Denied current use       DVT prophylaxis: INR therapeutic, heparin  gtt when INR <2 Code Status: DNR Family Communication: none Disposition Plan: To be determined  Objective: Vitals:   01/09/24 0300 01/09/24 0500 01/09/24 0723 01/09/24 1148  BP: (!) 96/58  107/61 101/62  Pulse: 70  71 70  Resp: 12  20 16   Temp: 97.7 F (36.5 C)  98.5 F (36.9 C) 98.2 F (36.8 C)  TempSrc: Oral  Oral Oral  SpO2: 97%  94% 100%  Weight:  66.2 kg    Height:        Intake/Output Summary (Last 24 hours) at 01/09/2024 1421 Last data filed at 01/09/2024 0900 Gross per 24 hour  Intake 600 ml  Output 3100 ml  Net -2500 ml   Filed Weights   01/06/24 0500 01/08/24 0838 01/09/24 0500  Weight: 62.6 kg 66.7 kg 66.2 kg    Examination:  General exam: Appears  calm and comfortable chronically ill, mild deficits HEENT: No JVD Respiratory system: Clear to auscultation Cardiovascular system: S1 & S2 heard, RRR.  Abd: nondistended, soft and nontender.Normal bowel sounds heard. Central nervous system: Alert and oriented. No focal neurological deficits. Extremities: Bilateral lower legs wounds with dressing Skin: As above Psychiatry: Flat  affect    Data Reviewed:   CBC: Recent Labs  Lab 01/03/24 0500 01/03/24 1522 01/05/24 0315 01/06/24 0500 01/07/24 0520 01/08/24 0520 01/09/24 0534  WBC 3.7*   < > 5.9 6.8 5.7 6.5 7.7  NEUTROABS 2.9  --   --   --   --   --   --   HGB 7.7*   < > 8.3* 8.2* 8.0* 8.3* 9.5*  HCT 23.7*   < > 25.5* 25.4* 25.0* 25.7* 30.1*  MCV 113.4*   < > 114.9* 114.4* 115.2* 115.2* 114.9*  PLT 372   < > 405* 442* 469* 521* 632*   < > = values in this interval not displayed.   Basic Metabolic Panel: Recent Labs  Lab 01/03/24 0500 01/04/24 0420 01/05/24 0315 01/06/24 0500 01/07/24 0520 01/08/24 0520 01/09/24 0534  NA 131*   < > 131* 133* 131* 130* 132*  K 3.5   < > 4.5 3.8 3.7 4.6 3.7  CL 98   < > 95* 90* 89* 89* 88*  CO2 25   < > 25 29 33* 32 32  GLUCOSE 109*   < > 116* 112* 102* 126* 116*  BUN 37*   < > 39* 49* 51* 53* 51*  CREATININE 1.56*   < > 1.75* 1.69* 1.86* 1.80* 2.01*  CALCIUM  8.3*   < > 8.8* 8.6* 8.6* 8.7* 9.3  MG 2.1  --   --   --   --   --   --   PHOS 3.5  --   --   --   --   --   --    < > = values in this interval not displayed.   GFR: Estimated Creatinine Clearance: 30.6 mL/min (A) (by C-G formula based on SCr of 2.01 mg/dL (H)). Liver Function Tests: Recent Labs  Lab 01/03/24 0500 01/05/24 0315  AST 35 50*  ALT 62* 61*  ALKPHOS 58 70  BILITOT 0.9 1.2  PROT 5.8* 6.2*  ALBUMIN  2.0* 2.2*   No results for input(s): LIPASE, AMYLASE in the last 168 hours. No results for input(s): AMMONIA in the last 168 hours. Coagulation Profile: Recent Labs  Lab 01/06/24 0500 01/06/24 1600 01/07/24 0520 01/08/24 0520 01/09/24 0534  INR 2.1* 2.1* 2.1* 2.3* 2.0*   Cardiac Enzymes: No results for input(s): CKTOTAL, CKMB, CKMBINDEX, TROPONINI in the last 168 hours. BNP (last 3 results) Recent Labs    08/29/23 1158  PROBNP 9,442*   HbA1C: No results for input(s): HGBA1C in the last 72 hours. CBG: No results for input(s): GLUCAP in the last 168  hours. Lipid Profile: No results for input(s): CHOL, HDL, LDLCALC, TRIG, CHOLHDL, LDLDIRECT in the last 72 hours. Thyroid  Function Tests: No results for input(s): TSH, T4TOTAL, FREET4, T3FREE, THYROIDAB in the last 72 hours. Anemia Panel: No results for input(s): VITAMINB12, FOLATE, FERRITIN, TIBC, IRON , RETICCTPCT in the last 72 hours. Urine analysis:    Component Value Date/Time   COLORURINE YELLOW 11/16/2022 1700   APPEARANCEUR CLEAR 11/16/2022 1700   LABSPEC 1.011 11/16/2022 1700   PHURINE 5.0 11/16/2022 1700   GLUCOSEU >=500 (A) 11/16/2022 1700   HGBUR NEGATIVE 11/16/2022 1700   BILIRUBINUR  NEGATIVE 11/16/2022 1700   KETONESUR NEGATIVE 11/16/2022 1700   PROTEINUR NEGATIVE 11/16/2022 1700   NITRITE NEGATIVE 11/16/2022 1700   LEUKOCYTESUR NEGATIVE 11/16/2022 1700   Sepsis Labs: @LABRCNTIP (procalcitonin:4,lacticidven:4)  )No results found for this or any previous visit (from the past 240 hours).   Radiology Studies: VAS US  LOWER EXTREMITY ARTERIAL DUPLEX Result Date: 01/04/2024 LOWER EXTREMITY ARTERIAL DUPLEX STUDY Patient Name:  Taylor [name]  Date of Exam:   01/04/2024 Medical Rec #: [id number]              Accession #:    [id number] Date of Birth: [birth year]              Patient Gender: M Patient Age:   79 years Exam Location:  [facility] Procedure:      VAS US  LOWER EXTREMITY ARTERIAL DUPLEX Referring Phys: DEBBY ROBERTSON --------------------------------------------------------------------------------  Indications: Rest pain. High Risk Factors: Hypertension, past history of smoking.  Vascular Interventions: Multiple interventions including EVAR and left fem-pop                         art BPG. Current ABI:            R- N/C, L-1.42 Limitations: pt unable to tolerate any pressure near feet due to heel ulcer. Performing Technologist: Elmarie Lindau, RVT  Examination Guidelines: A complete evaluation includes B-mode imaging, spectral  Doppler, color Doppler, and power Doppler as needed of all accessible portions of each vessel. Bilateral testing is considered an integral part of a complete examination. Limited examinations for reoccurring indications may be performed as noted.  +----------+--------+-----+---------------+-----------+--------+ RIGHT     PSV cm/sRatioStenosis       Waveform   Comments +----------+--------+-----+---------------+-----------+--------+ CFA Prox  102                         multiphasic         +----------+--------+-----+---------------+-----------+--------+ DFA       0                                               +----------+--------+-----+---------------+-----------+--------+ SFA Prox  260          50-74% stenosismultiphasic         +----------+--------+-----+---------------+-----------+--------+ SFA Mid   99                          multiphasic         +----------+--------+-----+---------------+-----------+--------+ SFA Distal321          50-74% stenosismultiphasic         +----------+--------+-----+---------------+-----------+--------+ POP Prox  52                          monophasic          +----------+--------+-----+---------------+-----------+--------+ POP Distal47                          monophasic          +----------+--------+-----+---------------+-----------+--------+ ATA Distal79                          monophasic          +----------+--------+-----+---------------+-----------+--------+ PTA Distal74  monophasic          +----------+--------+-----+---------------+-----------+--------+  Left Graft #1: Left Femoral Artery to Popliteal Artery Bypass Graft +--------------------+--------+--------+---------+--------+                     PSV cm/sStenosisWaveform Comments +--------------------+--------+--------+---------+--------+ Inflow              182             triphasic          +--------------------+--------+--------+---------+--------+ Proximal Anastomosis112             triphasic         +--------------------+--------+--------+---------+--------+ Proximal Graft      92              triphasic         +--------------------+--------+--------+---------+--------+ Mid Graft           81              triphasic         +--------------------+--------+--------+---------+--------+ Distal Graft        69              triphasic         +--------------------+--------+--------+---------+--------+ Distal Anastomosis  70              triphasic         +--------------------+--------+--------+---------+--------+ Outflow             167             triphasic         +--------------------+--------+--------+---------+--------+   Summary: Right: 50-74% stenosis noted in the superficial femoral artery. There are multiple lesions throughout the superficial femoral artery. Left: Patent left femoral artery to popliteal artery bypass graft with no hemodynamically significant stenosis.  See table(s) above for measurements and observations. Electronically signed by Norman Serve on 01/04/2024 at 4:38:01 PM.    Final    VAS US  ABI WITH/WO TBI Result Date: 01/04/2024  LOWER EXTREMITY DOPPLER STUDY Patient Name:  Taylor [name] [facility]  Date of Exam:   01/04/2024 Medical Rec #: [id number]              Accession #:    [id number] Date of Birth: November 12, 1950              Patient Gender: M Patient Age:   60 years Exam Location[facility]  [facility] Procedure:      VAS US  ABI WITH/WO TBI Referring Phys: DEBBY ROBERTSON --------------------------------------------------------------------------------  Indications: Rest pain. High Risk Factors: Hypertension, past history of smoking.  Vascular Interventions: Multiple interventions including EVAR and left Fem-Pop                         art BPG. Limitations: Today's exam was limited due to patient positioning and patient              intolerant to cuff  pressure. Comparison Study: 10/09/2022 Falsely elevated ABI bilaterally. Performing Technologist: Jimmye Scarce RVT  Examination Guidelines: A complete evaluation includes at minimum, Doppler waveform signals and systolic blood pressure reading at the level of bilateral brachial, anterior tibial, and posterior tibial arteries, when vessel segments are accessible. Bilateral testing is considered an integral part of a complete examination. Photoelectric Plethysmograph (PPG) waveforms and toe systolic pressure readings are included as required and additional duplex testing as needed. Limited examinations for reoccurring indications may be performed as noted.  ABI Findings: +--------+------------------+-----+----------+--------+ Right  Rt Pressure (mmHg)IndexWaveform  Comment  +--------+------------------+-----+----------+--------+ Brachial                                 IV       +--------+------------------+-----+----------+--------+ PTA     197               1.71 monophasic         +--------+------------------+-----+----------+--------+ DP      254               2.21 monophasic         +--------+------------------+-----+----------+--------+ +--------+------------------+-----+-----------+-------+ Left    Lt Pressure (mmHg)IndexWaveform   Comment +--------+------------------+-----+-----------+-------+ [id number]                                       +--------+------------------+-----+-----------+-------+ PTA     127               1.10 multiphasic        +--------+------------------+-----+-----------+-------+ DP      163               1.42 triphasic          +--------+------------------+-----+-----------+-------+ +-------+-----------+-----------+------------+------------+ ABI/TBIToday's ABIToday's TBIPrevious ABIPrevious TBI +-------+-----------+-----------+------------+------------+ Right  N/C                                             +-------+-----------+-----------+------------+------------+ Left   1.42                                           +-------+-----------+-----------+------------+------------+  Left ABI is falsely elevated.  Summary: Right: Resting right ankle-brachial index indicates noncompressible right lower extremity arteries. Left: Left ankle brachial index is falsely elevated. *See table(s) above for measurements and observations.  Electronically signed by Norman Serve on 01/04/2024 at 4:37:41 PM.    Final      Scheduled Meds:  amiodarone   200 mg Oral BID   aspirin  EC  81 mg Oral Daily   Chlorhexidine  Gluconate Cloth  6 each Topical Daily   feeding supplement  237 mL Oral BID BM   furosemide   40 mg Oral Daily   Gerhardt's butt cream   Topical BID   hydroxyurea   500 mg Oral Daily   And   hydroxyurea   1,000 mg Oral Once per day on Monday Wednesday Friday   levothyroxine   100 mcg Oral Q0600   mexiletine  200 mg Oral Q12H   multivitamin with minerals  1 tablet Oral Daily   pantoprazole   40 mg Oral BID   potassium chloride   40 mEq Oral Daily   sodium chloride  flush  10-40 mL Intracatheter Q12H   sodium chloride  flush  3 mL Intravenous Q12H   spironolactone   25 mg Oral Daily   thiamine   100 mg Oral Daily   traZODone   50 mg Oral QHS   vitamin A   10,000 Units Oral Daily   zinc  sulfate (50mg  elemental zinc )  220 mg Oral Daily   Continuous Infusions:   LOS: 7 days    Time spent:    Sigurd Pac, MD [facility]   01/09/2024, 2:21 PM

## 2024-01-09 NOTE — Progress Notes (Signed)
 PT Cancellation Note  Patient Details Name: Taylor [name] [facility] MRN: [id number] DOB: Jun 13, 1950   Cancelled Treatment:    Reason Eval/Treat Not Completed: (P) Other (comment) (pt being bathed by assist of NT x2) Will continue efforts per PT plan of care as schedule permits.   Connell HERO Tiarna Koppen 01/09/2024, 4:15 PM *delayed entry

## 2024-01-09 NOTE — Plan of Care (Signed)

## 2024-01-09 NOTE — Progress Notes (Cosign Needed)
 Advanced Heart Failure Rounding Note  HF Cardiologist: Dr. Cherrie   Chief Complaint: acute on chronic systolic heart failure and VT  Patient Profile   Taylor [name] is a 73 y.o male with mechnical AVR on chronic coumadin , h/o VF arrest s/p ICD, CAD s/p CABG, chronic systolic HF (EF 20-25%), AAA, h/o essential thrombosis and severe PAD w/ chronic nonhealing wounds, S/p EVAR for AAA requiring bilateral common femoral endarterectomies, admitted for VT w/ ICD shocks and a/c CHF w/ volume overload. W/ FTT prior to admit and had stopped taking many of his medications.   Subjective:    CO-OX 43%>>53% today  Good diuresis yesterday with IV lasix  + diamox . CVP 2.   Feels okay. No complaints.    Objective:    Weight Range: 66.2 kg Body mass index is 21.55 kg/m.   Vital Signs:   Temp:  [97.5 F (36.4 C)-98.5 F (36.9 C)] 98.5 F (36.9 C) (08/13 0723) Pulse Rate:  [70-71] 71 (08/13 0723) Resp:  [12-20] 20 (08/13 0723) BP: (96-109)/(51-62) 107/61 (08/13 0723) SpO2:  [94 %-99 %] 94 % (08/13 0723) Weight:  [66.2 kg-66.7 kg] 66.2 kg (08/13 0500) Last BM Date : 01/08/24  Weight change: Filed Weights   01/06/24 0500 01/08/24 0838 01/09/24 0500  Weight: 62.6 kg 66.7 kg 66.2 kg   Intake/Output:  Intake/Output Summary (Last 24 hours) at 01/09/2024 0821 Last data filed at 01/09/2024 0030 Gross per 24 hour  Intake 360 ml  Output 3250 ml  Net -2890 ml    Physical Exam   General:  Chronically ill appearing. No distress.  Cor: JVP flat. Regular rate & rhythm. No murmurs. Lungs: clear Abdomen: soft, nontender, nondistended. Extremities: no edema Neuro: alert & orientedx3. Affect flat    Telemetry   V paced 70s  Labs    CBC Recent Labs    01/08/24 0520 01/09/24 0534  WBC 6.5 7.7  HGB 8.3* 9.5*  HCT 25.7* 30.1*  MCV 115.2* 114.9*  PLT 521* 632*   Basic Metabolic Panel Recent Labs    91/87/74 0520 01/09/24 0534  NA 130* 132*  K 4.6 3.7  CL 89* 88*  CO2  32 32  GLUCOSE 126* 116*  BUN 53* 51*  CREATININE 1.80* 2.01*  CALCIUM  8.7* 9.3   Liver Function Tests No results for input(s): AST, ALT, ALKPHOS, BILITOT, PROT, ALBUMIN  in the last 72 hours.  BNP (last 3 results) Recent Labs    05/02/23 1435 08/01/23 1501 11/22/23 1240  BNP 753.7* 560.2* 1,219.5*   ProBNP (last 3 results) Recent Labs    08/29/23 1158  PROBNP 9,442*   Medications:    Scheduled Medications:  amiodarone   200 mg Oral BID   aspirin  EC  81 mg Oral Daily   Chlorhexidine  Gluconate Cloth  6 each Topical Daily   cholecalciferol   2,000 Units Oral Daily   Gerhardt's butt cream   Topical BID   hydroxyurea   500 mg Oral Daily   And   hydroxyurea   1,000 mg Oral Once per day on Monday Wednesday Friday   levothyroxine   100 mcg Oral Q0600   mexiletine  200 mg Oral Q12H   pantoprazole   40 mg Oral BID   potassium chloride   40 mEq Oral Daily   sodium chloride  flush  10-40 mL Intracatheter Q12H   sodium chloride  flush  3 mL Intravenous Q12H   spironolactone   25 mg Oral Daily   thiamine   100 mg Oral Daily   traZODone   50 mg Oral QHS  Infusions:    PRN Medications: acetaminophen , ondansetron  (ZOFRAN ) IV, oxyCODONE , sodium chloride  flush, sodium chloride  flush, traMADol   Assessment/Plan   1. VT w/ ICD Shocks - H/o VF Arrest and H/o CHB. S/p [employer] CRT-D device, followed by EP (Dr. Fernande) - Suspect VT triggered by worsening HF w/ marked volume overload + hypokalemia  - No recurrent VT - continue amio 200 mg bid - continue mexiletine 200 mg bid   - keep K > 4.0 and Mg > 2.0   2. Acute on Chronic Systolic Heart Failure - Echo (05/2016 @ [facility]): EF 40% - Longstanding EF 25-30% from 2018 to now.  - Most recent 5/25 EF 25-30%, G2DD, mildly reduced RV. - s/p CRT-D.  - Chronically NYHA III-IV mainly limited currently by LE wounds and poor mobility - Admitted w/ marked volume overload. Had missed several days of home meds (feeling poorly) - CO-OX  marginal 53% today - Volume looks good. Stop IV lasix . Start po lasix  40 mg daily. - GDMT limited by hypotension and CKD.  - Stopped Farxiga  with poor mobility and wound healing - Continue Spiro 25 mg daily  - Off Coreg  with marginal CO-OX - Has failed evaluation for Barostim and CCM - Currently not candidate for advanced therapies with frailty, severe PAD/open wounds. Palliative Care consult for GOC discussions.   3. CAD - h/o remote PCI in 1994 at [facility] - s/p LHC in 2017 in the setting of Vfib Arrest w/ 3V CAD treated w/ CABG x 3 - he denies angina. HS trop trend not c/w ACS  - Continue ASA  - statin intolerant, needs to be consider for PSK9i (likely of limited benefit at this point with poor prognosis) - holding ? blocker for now    4. Mechanical Aortic Valve - Stable on last echo  - On coumadin  PTA. INR therapeutic, 2.0.    5. PAD with nonhealing lower extremity wounds and rest pain - 6/24 EVAR for AAA requiring bilateral common femoral endarterectomies.  He had to go back on 11/10/2022 for a thrombosed left common femoral artery after endarterectomy. Then went to [facility] for left fem-pop. Course c/b LLE foot ulcer - He had been trying to get in w/ [facility] due to concern for potential need for amputation and effort for limb salvage - Continues to struggle with nonhealing wounds. Has been followed at [facility] and here. - Peripheral angiography aborted d/t elevated INR. D/t poor functional status not planning to reschedule pending GOC discussions with palliative care    6. IDA - Hgb 9.0>>7.7>>8.2>>8.3 - Got 2 units PRBCs at hospital in [place].   - Ferritin 79, T sat 12% s/p IV Fe.   7. Essential thrombocytosis - On hydroxyurea   Length of Stay: 7  Caelan Atchley N, PA-C  01/09/2024, 8:21 AM  Advanced Heart Failure Team Pager [phone] (M-F; 7a - 5p)  Please contact [facility] for night-coverage after hours (5p -7a ) and weekends on [email or web address]

## 2024-01-09 NOTE — Progress Notes (Signed)
 Nutrition Follow-up  DOCUMENTATION CODES:  Severe malnutrition in context of chronic illness  INTERVENTION:  Continue regular to promote adequate oral intake Encourage smaller, more frequent oral intake Ordering with assistance Discontinue snacks and mighty shakes Change Magic cup to BID with meals, each supplement provides 290 kcal and 9 grams of protein Discontinue Vitamin D  given lab result WDL Recommend Vitamin A  repletion 10,000 units x 2 weeks Recommend Zinc  repletion 220mg  x 14 days   Discussed with MD and Palliative Care recommendation for Cortrak of short term enteral nutrition in the setting of inadequate oral intake >/=7 days and severe malnutrition if aligns with GOC discussion; MD recommends deferring for now given recommendations for palliative  NUTRITION DIAGNOSIS:  Severe Malnutrition related to chronic illness (FTT, CAD, mechanical AVR, HF, severe PAD) as evidenced by severe fat depletion, severe muscle depletion, energy intake < or equal to 75% for > or equal to 1 month. - remains applicable  GOAL:  Patient will meet greater than or equal to 90% of their needs - goal unmet, addressing via meals and nutrition supplements  MONITOR:  PO intake, Supplement acceptance, Diet advancement, Labs, Weight trends  REASON FOR ASSESSMENT:  Consult Assessment of nutrition requirement/status, Wound healing  ASSESSMENT:  Pt admitted with syncope, ICD shock and failure to thrive. PMH significant for mechanical AVR on chronic coumadin , VF arrest s/p ICD, CAD s/p CABG, chronic systolic HF, AAA, severe PAD w/chronic nonhealing wounds.  VT thought to be in the setting of decompensated HF and hypokalemia.  Admission c/b fluctuating Cr and non-healing LE wounds.   Vascular planning for angiogram however INR remains elevated.  Plans for PMT consult prior to angiogram.  HF amenable to help lower INR for angio.   Spoke with pt at bedside. He had a magic cup delivered but no other  food items. Obtained breakfast order and placed for patient.   He states that he is eating a negligible amount. He was approved per HF to receive Ensure to help with INR. RN provided 2 yesterday however pt reports that he consumed very little. He is only eating about 1 magic cup per day.   Pt still endorses chalky/altered taste. Tongue appears to have small white patches on sides, question whether this is thrush. Discussed with MD.   Meal completions: 8/6: 0% x 2 recorded meals 8/9: 10% x2 recorded meals 8/11: 0% x3 recorded meals  RN mentions that pt's daughter was inquiring about a feeding tube during visit yesterday. Discussed with patient and allowed him to provide his thoughts. He would be amenable to this for short term nutrition support given his poor oral intake and desire to regain some strength. Per discussion with MD, defer for now given recommendation for palliative care. PMT to see today to elicit GOC.   Medications: Vitamin D  2,000 units daily, klor-con , thiamine    Labs:  Sodium 132 BUN 51 Cr 20.1 GFR 34  Vitamin/micronutrient labs: CRP 14.7 Vitamin D  45.89 Vitamin A  7.4 Zinc  37 Copper  170  Diet Order:   Diet Order             Diet regular Room service appropriate? Yes; Fluid consistency: Thin  Diet effective now                   EDUCATION NEEDS:   Education needs have been addressed  Skin:  Skin Assessment: Skin Integrity Issues: Skin Integrity Issues:: Stage II, Unstageable, Other (Comment) Stage II: medial sacrum, R buttock, L elbow Unstageable: L heel,  R heel Other: Vascular ulcer- R ankle/foot, L foot/ankle  Last BM:  8/12  Height:   Ht Readings from Last 1 Encounters:  01/02/24 5' 9 (1.753 m)    Weight:   Wt Readings from Last 1 Encounters:  01/09/24 66.2 kg   BMI:  Body mass index is 21.55 kg/m.  Estimated Nutritional Needs:   Kcal:  1800-2000  Protein:  95-110g  Fluid:  >/=1.8L  Allie Desirea Mizrahi, RDN, LDN Clinical  Nutrition See AMiON for contact information.

## 2024-01-09 NOTE — Consult Note (Signed)
 Consultation Note Date: 01/09/2024   Patient Name: Taylor [name] [facility]  DOB: Mar 24, 1951  MRN: [id number]  Age / Sex: 73 y.o., male  PCP: Joella Sieving, DO Referring Physician: Inocencio Soyla Lunger, MD  Reason for Consultation: Establishing goals of care  HPI/Patient Profile: 73 y.o. male  with past medical history of AoV abnormality s/p mechanical AVR (on coumadin ), h/o VF arrest in 2017 s/p CRT-D, CAD s/p CABG x3 + redo mAVR, chronic systolic HF (EF 20-25%), paroxysmal AF, AAA s/p EVAR with b/l fem endarterectomies c/b thrombosis of L fem and foot ulcer, essential thrombosis, ETOH, cocaine, and tobacco abuse admitted on 01/02/2024 with ICD shocks/VT.   Patient initially presented to [facility] in [place].  Transferred here and was also found to have acute on chronic systolic CHF.  Acute on chronic anemia.  Electrolyte disturbances, concerns with his chronic wounds secondary to PAD and consideration of angiogram.  PMT has been consulted to assist with goals of care conversation.  Clinical Assessment and Goals of Care:  I have reviewed medical records including EPIC notes, labs and imaging, assessed the patient and then met at the bedside to discuss diagnosis prognosis, GOC, EOL wishes, disposition and options.  Discussed with dietitian and MD.  I introduced Palliative Medicine as specialized medical care for people living with serious illness. It focuses on providing relief from the symptoms and stress of a serious illness. The goal is to improve quality of life for both the patient and the family.  We discussed a brief life review of the patient and then focused on their current illness.  The natural disease trajectory and expectations at EOL were discussed.  I attempted to elicit values and goals of care important to the patient.    Social History: Patient shares he was living home alone.  His support people are his sister, son, and daughter.  He is  divorced.  His hobbies/interests are drinking beer and he cannot think of anything else.  Functional and Nutritional State: Patient states until 3 weeks ago, he was ambulating without difficulty.  He has a wall rollator and a walker.  Albumin  of 2.2.  Advance Directives: A detailed discussion regarding advanced directives was had.  Review documentation currently on file.  His Sister [name] is his primary HCPOA.    Discussion: Patient states he has not worked with palliative in the past but he was informed of our team's upcoming involvement.  He feels his quality of life has been worsening due to the loss of his independence and he hopes to regain this if possible.  He is not ready for a comfort focused approach.   He states that the medical updates regarding his care have been clear and makes sense.  I asked him to share what he has learned thus far and he paused to consider, then requested to hold off on further conversation so he can eat his meal.  He is agreeable to another visit to continue discussions and address his goals of care.   Discussed the importance of continued conversation with family and the medical providers regarding overall plan of care and treatment options, ensuring decisions are within the context of the patient's values and GOCs.   Questions and concerns were addressed.  The family was encouraged to call with questions or concerns.  PMT will continue to support holistically.   SUMMARY OF RECOMMENDATIONS   - Continue DNR/DNI - Patient hopes to get stronger and regain at least some of his independence - Ongoing goals  of care discussions -PMT will continue to follow and support  Prognosis:  Poor long-term prognosis  Discharge Planning: To Be Determined      Primary Diagnoses: Present on Admission:  Ventricular tachycardia [facility])    Physical Exam Vitals and nursing note reviewed.  Constitutional:      General: He is not in acute distress.    Appearance:  He is ill-appearing.  HENT:     Head: Normocephalic and atraumatic.  Cardiovascular:     Rate and Rhythm: Normal rate.  Pulmonary:     Effort: Pulmonary effort is normal.  Neurological:     Mental Status: He is alert.     Motor: Weakness present.  Psychiatric:        Speech: Speech normal.        Behavior: Behavior normal.     Vital Signs: BP 107/61 (BP Location: Left Arm)   Pulse 71   Temp 98.5 F (36.9 C) (Oral)   Resp 20   Ht 5' 9 (1.753 m)   Wt 66.2 kg   SpO2 94%   BMI 21.55 kg/m  Pain Scale: 0-10 POSS *See Group Information*: S-Acceptable,Sleep, easy to arouse Pain Score: 5    SpO2: SpO2: 94 % O2 Device:SpO2: 94 % O2 Flow Rate: .O2 Flow Rate (L/min): 2 L/min   Palliative Assessment/Data: TBD    Total time: I spent 60 minutes in the care of the patient today in the above activities and documenting the encounter.   Analiza Cowger P Margaretann Abate, PA-C  Palliative Medicine Team Team phone # [phone]  Thank you for allowing the Palliative Medicine Team to assist in the care of this patient. Please utilize secure chat with additional questions, if there is no response within 30 minutes please call the above phone number.  Palliative Medicine Team providers are available by phone from 7am to 7pm daily and can be reached through the team cell phone.  Should this patient require assistance outside of these hours, please call the patient's attending physician.

## 2024-01-09 NOTE — Progress Notes (Signed)
 Patient Name: Taylor [name] Date of Encounter: 01/09/2024  Primary Cardiologist: None Electrophysiologist: Taylor [name], MD  Interval Summary   The patient denies acute concerns today.  Asking questions about rehab efforts and what that may look like in the future.   Vital Signs    Vitals:   01/08/24 2300 01/09/24 0300 01/09/24 0500 01/09/24 0723  BP: 100/62 (!) 96/58  107/61  Pulse: 70 70  71  Resp: 16 12  20   Temp: (!) 97.5 F (36.4 C) 97.7 F (36.5 C)  98.5 F (36.9 C)  TempSrc: Oral Oral  Oral  SpO2: 97% 97%  94%  Weight:   66.2 kg   Height:        Intake/Output Summary (Last 24 hours) at 01/09/2024 9074 Last data filed at 01/09/2024 0030 Gross per 24 hour  Intake 360 ml  Output 3250 ml  Net -2890 ml   Filed Weights   01/06/24 0500 01/08/24 0838 01/09/24 0500  Weight: 62.6 kg 66.7 kg 66.2 kg    Physical Exam    GEN- chronically ill appearing adult male in NAD, Alert and oriented  Lungs- Clear to ausculation bilaterally, normal work of breathing Cardiac- Regular rate and rhythm (VP), no murmurs, rubs or gallops GI- soft, NT, ND, + BS Extremities- no clubbing or cyanosis. No edema  Telemetry    VP 70's  (personally reviewed)  [name]    Taylor [name] is a 73 y.o. male with a history of AoV abnormality s/p mechanical AVR (on coumadin ), h/o VF arrest in 2017 s/p CRT-D, CAD s/p CABG x3 + redo mAVR, chronic systolic HF (EF 20-25%), paroxysmal AF, AAA s/p EVAR with b/l fem endarterectomies c/b thrombosis of L fem and foot ulcer, essential thrombosis, ETOH, cocaine, and tobacco abuse who is being admitted today for the evaluation of ICD shock.  Initially presented to [name] in [place] and transferred for cardiac care.  VT thought in setting of decompensated HF & hypokalemia.  He was seen by the Advanced HF team.  [name] complicated by fluctuating Cr, non-healing LE wounds.  He was seen by [facility]VS who were planning angiogram  > however, his INR remained elevated and this was delayed.  Palliative Care consulted for GOC.   Assessment & Plan    VT with ICD Shock  Hx VF Arrest s/p ICD  In setting of decompensated HF + hypokalemia  -continue amiodarone  200 mg BID  -continue mexiletine 200 mg BID  -tele monitoring  -follow electrolytes, goal K+>4, Mg+>2   Acute on Chronic Systolic Heart Failure  CAD s/p CABG  LVEF 25-30% 09/2023  -per AHF Team  -continue spiro, acetazolamide , farxiga  -coreg  on hold  -deemed end stage / not a candidate for advanced therapies for HF   Mechanical AV  -on coumadin  at baseline  -heparin  gtt while inpatient  -VVS folowing INR   AAA  PAD  Non-Healing LE Wounds  CTA 10/2022 with occlusion of proximal SFA on L. S/p EVAR for AAA requiring bilateral common femoral endarterectomies -VVS following -VVS waiting on Palliative Care input > possible pending angiogram once INR <1.8 & based on outcome of meeting  CKD IIIb  -Trend BMP / urinary output -Replace electrolytes as indicated -Avoid nephrotoxic agents, ensure adequate renal perfusion  Essential Thrombocytosis  Hx Lacunar Strokes, Seizures  -follows with Heme as outpatient  -per [facility]   Hypothyroidism  -per [facility]   Microcytic Anemia / IDA  -per [facility]   Hx ETOH Abuse  -monitor /  supportive care   Adult Failure to Thrive  Deconditioning  -await Palliative Care input  -anticipate he Taylor need short term rehab efforts  -end stage HF, PAD  -social work reviewing for possible SNF placement in [place] or [place] area  -sister Taylor [name] involved in care decisions     Medical Readiness Date: 01/09/2024    For questions or updates, please contact [facility] Please consult [email or web address] for contact info under     Signed, Daphne Barrack, NP-C, AGACNP-BC [facility] - Electrophysiology  01/09/2024, 9:31 AM

## 2024-01-09 NOTE — Progress Notes (Addendum)
 Advanced Heart Failure Rounding Note  HF Cardiologist: Dr. Cherrie   Chief Complaint: acute on chronic systolic heart failure and VT  Patient Profile   Taylor [name] is a 73 y.o male with mechnical AVR on chronic coumadin , h/o VF arrest s/p ICD, CAD s/p CABG, chronic systolic HF (EF 20-25%), AAA, h/o essential thrombosis and severe PAD w/ chronic nonhealing wounds, S/p EVAR for AAA requiring bilateral common femoral endarterectomies, admitted for VT w/ ICD shocks and a/c CHF w/ volume overload. W/ FTT prior to admit and had stopped taking many of his medications.   Subjective:    CO-OX 43%>>53% today  Good diuresis yesterday with IV lasix  + diamox . CVP 2.   Feels okay. No complaints.    Objective:    Weight Range: 66.2 kg Body mass index is 21.55 kg/m.   Vital Signs:   Temp:  [97.5 F (36.4 C)-98.5 F (36.9 C)] 98.5 F (36.9 C) (08/13 0723) Pulse Rate:  [70-71] 71 (08/13 0723) Resp:  [12-20] 20 (08/13 0723) BP: (96-109)/(51-62) 107/61 (08/13 0723) SpO2:  [94 %-99 %] 94 % (08/13 0723) Weight:  [66.2 kg-66.7 kg] 66.2 kg (08/13 0500) Last BM Date : 01/08/24  Weight change: Filed Weights   01/06/24 0500 01/08/24 0838 01/09/24 0500  Weight: 62.6 kg 66.7 kg 66.2 kg   Intake/Output:  Intake/Output Summary (Last 24 hours) at 01/09/2024 0821 Last data filed at 01/09/2024 0030 Gross per 24 hour  Intake 360 ml  Output 3250 ml  Net -2890 ml    Physical Exam   General:  Chronically ill appearing. No distress.  Cor: JVP flat. Regular rate & rhythm. No murmurs. Lungs: clear Abdomen: soft, nontender, nondistended. Extremities: no edema Neuro: alert & orientedx3. Affect flat    Telemetry   V paced 70s  Labs    CBC Recent Labs    01/08/24 0520 01/09/24 0534  WBC 6.5 7.7  HGB 8.3* 9.5*  HCT 25.7* 30.1*  MCV 115.2* 114.9*  PLT 521* 632*   Basic Metabolic Panel Recent Labs    91/87/74 0520 01/09/24 0534  NA 130* 132*  K 4.6 3.7  CL 89* 88*  CO2  32 32  GLUCOSE 126* 116*  BUN 53* 51*  CREATININE 1.80* 2.01*  CALCIUM  8.7* 9.3   Liver Function Tests No results for input(s): AST, ALT, ALKPHOS, BILITOT, PROT, ALBUMIN  in the last 72 hours.  BNP (last 3 results) Recent Labs    05/02/23 1435 08/01/23 1501 11/22/23 1240  BNP 753.7* 560.2* 1,219.5*   ProBNP (last 3 results) Recent Labs    08/29/23 1158  PROBNP 9,442*   Medications:    Scheduled Medications:  amiodarone   200 mg Oral BID   aspirin  EC  81 mg Oral Daily   Chlorhexidine  Gluconate Cloth  6 each Topical Daily   cholecalciferol   2,000 Units Oral Daily   Gerhardt's butt cream   Topical BID   hydroxyurea   500 mg Oral Daily   And   hydroxyurea   1,000 mg Oral Once per day on Monday Wednesday Friday   levothyroxine   100 mcg Oral Q0600   mexiletine  200 mg Oral Q12H   pantoprazole   40 mg Oral BID   potassium chloride   40 mEq Oral Daily   sodium chloride  flush  10-40 mL Intracatheter Q12H   sodium chloride  flush  3 mL Intravenous Q12H   spironolactone   25 mg Oral Daily   thiamine   100 mg Oral Daily   traZODone   50 mg Oral QHS  Infusions:    PRN Medications: acetaminophen , ondansetron  (ZOFRAN ) IV, oxyCODONE , sodium chloride  flush, sodium chloride  flush, traMADol   Assessment/Plan   1. VT w/ ICD Shocks - H/o VF Arrest and H/o CHB. S/p [employer] CRT-D device, followed by EP (Dr. Fernande) - Suspect VT triggered by worsening HF w/ marked volume overload + hypokalemia  - No recurrent VT - continue amio 200 mg bid - continue mexiletine 200 mg bid   - keep K > 4.0 and Mg > 2.0   2. Acute on Chronic Systolic Heart Failure - Echo (05/2016 @ [facility]): EF 40% - Longstanding EF 25-30% from 2018 to now.  - Most recent 5/25 EF 25-30%, G2DD, mildly reduced RV. - s/p CRT-D.  - Chronically NYHA III-IV mainly limited currently by LE wounds and poor mobility - Admitted w/ marked volume overload. Had missed several days of home meds (feeling poorly) - CO-OX  marginal 53% today - Volume looks good. Stop IV lasix . Start po lasix  40 mg daily. - GDMT limited by hypotension and CKD.  - Stopped Farxiga  with poor mobility and wound healing - Continue Spiro 25 mg daily  - Off Coreg  with marginal CO-OX - Has failed evaluation for Barostim and CCM - Currently not candidate for advanced therapies with frailty, severe PAD/open wounds. Palliative Care consult for GOC discussions.   3. CAD - h/o remote PCI in 1994 at [facility] - s/p LHC in 2017 in the setting of Vfib Arrest w/ 3V CAD treated w/ CABG x 3 - he denies angina. HS trop trend not c/w ACS  - Continue ASA  - statin intolerant, needs to be consider for PSK9i (likely of limited benefit at this point with poor prognosis) - holding ? blocker for now    4. Mechanical Aortic Valve - Stable on last echo  - On coumadin  PTA. INR therapeutic, 2.0.    5. PAD with nonhealing lower extremity wounds and rest pain - 6/24 EVAR for AAA requiring bilateral common femoral endarterectomies.  He had to go back on 11/10/2022 for a thrombosed left common femoral artery after endarterectomy. Then went to [facility] for left fem-pop. Course c/b LLE foot ulcer - He had been trying to get in w/ [facility] due to concern for potential need for amputation and effort for limb salvage - Continues to struggle with nonhealing wounds. Has been followed at [facility] and here. - Peripheral angiography aborted d/t elevated INR. D/t poor functional status not planning to reschedule pending GOC discussions with palliative care    6. IDA - Hgb 9.0>>7.7>>8.2>>8.3 - Got 2 units PRBCs at hospital in [place].   - Ferritin 79, T sat 12% s/p IV Fe.   7. Essential thrombocytosis - On hydroxyurea   Length of Stay: 7  FINCH, LINDSAY N, PA-C  01/09/2024, 8:21 AM  Advanced Heart Failure Team Pager [phone] (M-F; 7a - 5p)  Please contact [facility] for night-coverage after hours (5p -7a ) and weekends on [email or web address]  Patient seen and examined with  the above-signed Advanced Practice Provider and/or Housestaff. I personally reviewed laboratory data, imaging studies and relevant notes. I independently examined the patient and formulated the important aspects of the plan. I have edited the note to reflect any of my changes or salient points. I have personally discussed the plan with the patient and/or family.  Remains weak. Denies CP or SOB. Diuresed well. CVP 2. Co-ox 53%  General:  Chronically ill appearing. No resp difficulty HEENT: normal Neck: supple. no JVD. Carotids 2+ bilat; no bruits.  No lymphadenopathy or thryomegaly appreciated. Cor: Regular rate & rhythm. No rubs, gallops or murmurs. Lungs: clear Abdomen: soft, nontender, nondistended. No hepatosplenomegaly. No bruits or masses. Good bowel sounds. Extremities: no cyanosis, clubbing, rash, edema wounds wrapped Neuro: alert & orientedx3, cranial nerves grossly intact. moves all 4 extremities w/o difficulty. Affect pleasant  He has end-stage HF and severe PAD with non-healing wounds. Co-ox marginal. Volume ok. Will hold diuretics today.   Multi-disciplinary discussions ongoing with us , VVS, TRH, Pallaitive Care teams regarding next steps.   Toribio Fuel, MD  7:56 AM

## 2024-01-10 DIAGNOSIS — Z515 Encounter for palliative care: Secondary | ICD-10-CM | POA: Diagnosis not present

## 2024-01-10 DIAGNOSIS — Z952 Presence of prosthetic heart valve: Secondary | ICD-10-CM | POA: Diagnosis not present

## 2024-01-10 DIAGNOSIS — I472 Ventricular tachycardia, unspecified: Secondary | ICD-10-CM | POA: Diagnosis not present

## 2024-01-10 DIAGNOSIS — I5043 Acute on chronic combined systolic (congestive) and diastolic (congestive) heart failure: Secondary | ICD-10-CM | POA: Diagnosis not present

## 2024-01-10 DIAGNOSIS — Z95810 Presence of automatic (implantable) cardiac defibrillator: Secondary | ICD-10-CM | POA: Diagnosis not present

## 2024-01-10 DIAGNOSIS — E43 Unspecified severe protein-calorie malnutrition: Secondary | ICD-10-CM | POA: Diagnosis not present

## 2024-01-10 LAB — COOXEMETRY PANEL
Carboxyhemoglobin: 1.8 % — ABNORMAL HIGH (ref 0.5–1.5)
Methemoglobin: <0.7 % (ref 0.0–1.5)
O2 Saturation: 49.1 %
Total hemoglobin: 10.3 g/dL — ABNORMAL LOW (ref 12.0–16.0)

## 2024-01-10 LAB — BASIC METABOLIC PANEL WITH GFR
Anion gap: 11 (ref 5–15)
BUN: 52 mg/dL — ABNORMAL HIGH (ref 8–23)
CO2: 29 mmol/L (ref 22–32)
Calcium: 9.1 mg/dL (ref 8.9–10.3)
Chloride: 88 mmol/L — ABNORMAL LOW (ref 98–111)
Creatinine, Ser: 1.75 mg/dL — ABNORMAL HIGH (ref 0.61–1.24)
GFR, Estimated: 41 mL/min — ABNORMAL LOW (ref >60)
Glucose, Bld: 115 mg/dL — ABNORMAL HIGH (ref 70–99)
Potassium: 3.9 mmol/L (ref 3.5–5.1)
Sodium: 128 mmol/L — ABNORMAL LOW (ref 135–145)

## 2024-01-10 LAB — HEPARIN LEVEL (UNFRACTIONATED)
Heparin Unfractionated: >1.1 [IU]/mL — ABNORMAL HIGH (ref 0.30–0.70)
Heparin Unfractionated: <0.1 [IU]/mL — ABNORMAL LOW (ref 0.30–0.70)

## 2024-01-10 LAB — PROTIME-INR
INR: 1.6 — ABNORMAL HIGH (ref 0.8–1.2)
Prothrombin Time: 19.8 s — ABNORMAL HIGH (ref 11.4–15.2)

## 2024-01-10 MED ORDER — TRAMADOL HCL 50 MG PO TABS
50.0000 mg | ORAL_TABLET | Freq: Four times a day (QID) | ORAL | Status: DC | PRN
  Administered 2024-01-10 (×2): 50 mg via ORAL
  Filled 2024-01-10 (×2): qty 1

## 2024-01-10 MED ORDER — HEPARIN (PORCINE) 25000 UT/250ML-% IV SOLN
1250.0000 [IU]/h | INTRAVENOUS | Status: DC
  Administered 2024-01-10: 1050 [IU]/h via INTRAVENOUS
  Filled 2024-01-10 (×2): qty 250

## 2024-01-10 NOTE — Progress Notes (Signed)
 PHARMACY - ANTICOAGULATION  Pharmacy Consult for heparin  Indication: AVR/Afib  Allergies  Allergen Reactions   Erythromycin Rash   Zithromax [Azithromycin] Rash    Patient Measurements: Height: 5' 9 (175.3 cm) Weight: 58.1 kg (128 lb) IBW/kg (Calculated) : 70.7 HEPARIN  DW (KG): 66.1  Vital Signs: Temp: 97.9 F (36.6 C) (08/14 0739) Temp Source: Oral (08/14 0739) BP: 113/64 (08/14 0739) Pulse Rate: 72 (08/14 0739)  Labs: Recent Labs    01/08/24 0520 01/09/24 0534 01/10/24 0545  HGB 8.3* 9.5*  --   HCT 25.7* 30.1*  --   PLT 521* 632*  --   LABPROT 26.2* 23.3* 19.8*  INR 2.3* 2.0* 1.6*  HEPARINUNFRC <0.10*  --   --   CREATININE 1.80* 2.01* 1.75*    Estimated Creatinine Clearance: 30.9 mL/min (A) (by C-G formula based on SCr of 1.75 mg/dL (H)).   Assessment: 73 y.o. male with h/o Afib and mAVR on warfarin PTA. INR down to 1.6 after holding, pharmacy consulted for IV heparin .  Goal of Therapy:  INR 2-3 as per outpatient visit  Heparin  level 0.3-0.7 units/ml Monitor platelets by anticoagulation protocol: Yes   Plan:  Start heparin  1050 units/h no bolus Check heparin  level in 8h  Taylor [name], PharmD, [place], [facility] Clinical Pharmacist [phone] Please check AMION for all [facility] Pharmacy numbers 01/10/2024

## 2024-01-10 NOTE — Progress Notes (Signed)
  Progress Note    01/10/2024 8:51 AM * No surgery found *  Subjective:  continued lower extremity pain   Vitals:   01/10/24 0300 01/10/24 0739  BP: 126/64 113/64  Pulse: 71 72  Resp: 20 15  Temp: 97.8 F (36.6 C) 97.9 F (36.6 C)  SpO2: 100% 99%   Physical Exam: Cardiac:  regular Lungs:  non labored Extremities:  BLE with Prevalon boots, ischemic wounds to BLE Neurologic: alert and oriented  CBC    Component Value Date/Time   WBC 7.7 01/09/2024 0534   RBC 2.62 (L) 01/09/2024 0534   HGB 9.5 (L) 01/09/2024 0534   HGB 12.6 (L) 02/07/2018 1336   HCT 30.1 (L) 01/09/2024 0534   HCT 35.0 (L) 02/07/2018 1336   PLT 632 (H) 01/09/2024 0534   PLT 440 02/07/2018 1336   MCV 114.9 (H) 01/09/2024 0534   MCV 108 (H) 02/07/2018 1336   MCH 36.3 (H) 01/09/2024 0534   MCHC 31.6 01/09/2024 0534   RDW 29.5 (H) 01/09/2024 0534   RDW 15.7 (H) 02/07/2018 1336   LYMPHSABS 0.1 (L) 01/03/2024 0500   LYMPHSABS 0.7 02/07/2018 1336   MONOABS 0.6 01/03/2024 0500   EOSABS 0.0 01/03/2024 0500   EOSABS 0.1 02/07/2018 1336   BASOSABS 0.0 01/03/2024 0500   BASOSABS 0.0 02/07/2018 1336    BMET    Component Value Date/Time   NA 128 (L) 01/10/2024 0545   NA 136 07/04/2022 1212   K 3.9 01/10/2024 0545   CL 88 (L) 01/10/2024 0545   CO2 29 01/10/2024 0545   GLUCOSE 115 (H) 01/10/2024 0545   BUN 52 (H) 01/10/2024 0545   BUN 24 07/04/2022 1212   CREATININE 1.75 (H) 01/10/2024 0545   CALCIUM  9.1 01/10/2024 0545   GFRNONAA 41 (L) 01/10/2024 0545   GFRAA >60 10/06/2019 1055    INR    Component Value Date/Time   INR 1.6 (H) 01/10/2024 0545   INR 4.3 01/25/2023 1513     Intake/Output Summary (Last 24 hours) at 01/10/2024 0851 Last data filed at 01/10/2024 0533 Gross per 24 hour  Intake 1080 ml  Output 1750 ml  Net -670 ml     Assessment/Plan:  73 y.o. male with PAD with bilateral lower extremity wounds and rest pain  BLE wounds with pain unchanged Patient has had Angiogram  rescheduled several times due to elevated INR and cardiac status INR 1.6 this morning Okay for Heparin  Appreciate palliative for seeing him and recommend continued discussion regarding GOC From Cardiac standpoint he is essentially as optimized as he can be at this time Will try to get him rescheduled for Angiogram as it is pts desire to continue full care Possibly can add him onto cath lab schedule for tomorrow afternoon pending availability Will have him NPO after midnight  Teretha Damme, PA-C [facility] [phone] 01/10/2024 8:51 AM

## 2024-01-10 NOTE — Progress Notes (Addendum)
 Patient Name: Taylor Taylor [name] Taylor Taylor [name] Date of Encounter: 01/10/2024  Primary Cardiologist: None Electrophysiologist: Taylor Taylor [name] [name], MD  Interval Summary   The patient is doing well today.  At this time, the patient denies chest pain, shortness of breath, or any new concerns.  Vital Signs    Vitals:   01/09/24 2312 01/10/24 0005 01/10/24 0300 01/10/24 0739  BP: (!) 90/53  126/64 113/64  Pulse: 70  71 72  Resp: 20  20 15   Temp: (!) 97.1 F (36.2 C)  97.8 F (36.6 C) 97.9 F (36.6 C)  TempSrc: Axillary  Oral Oral  SpO2: (!) 88% 93% 100% 99%  Weight:   58.1 kg   Height:        Intake/Output Summary (Last 24 hours) at 01/10/2024 0857 Last data filed at 01/10/2024 0533 Gross per 24 hour  Intake 1080 ml  Output 1750 ml  Net -670 ml   Filed Weights   01/08/24 0838 01/09/24 0500 01/10/24 0300  Weight: 66.7 kg 66.2 kg 58.1 kg    Physical Exam    GEN- NAD, Alert and oriented  Lungs- Clear to ausculation bilaterally, normal work of breathing Cardiac- Regular rate and rhythm, no murmurs, rubs or gallops GI- soft, NT, ND, + BS Extremities- no clubbing or cyanosis. No edema  Telemetry    SR  60's with occ PVC's  (personally reviewed)  Taylor [name]    Taylor Taylor [name] is a 73 y.o. male with a history of AoV abnormality s/p mechanical AVR (on coumadin ), h/o VF arrest in 2017 s/p CRT-D, CAD s/p CABG x3 + redo mAVR, chronic systolic HF (EF 20-25%), paroxysmal AF, AAA s/p EVAR with b/l fem endarterectomies c/b thrombosis of L fem and foot ulcer, essential thrombosis, ETOH, cocaine, and tobacco abuse who is being admitted today for the evaluation of ICD shock.  Initially presented to [name] in [place] and transferred for cardiac care.  VT thought in setting of decompensated HF & hypokalemia.  He was seen by the Advanced HF team.  Taylor [name] complicated by fluctuating Cr, non-healing LE wounds.  He was seen by VVS who were planning angiogram > however, his INR  remained elevated and this was delayed.  Palliative Care consulted for GOC.   Assessment & Plan    VT with ICD Shock  Hx VF Arrest s/p ICD  In setting of decompensated HF + hypokalemia  -amiodarone  200 mg BID  -mexiletine 200 mg BID  -follow electrolytes closely, goal K+>4, Mg+ >2   Acute on Chronic Systolic Heart Failure  CAD s/p CABG  LVEF 25-30% 09/2023, Deemed end stage / not a candidate for advanced therapies for HF  -appreciate Advanced HF Team  -GDMT per above   Mechanical AV  -coumadin  at baseline  -INR 1.6  -begin heparin  gtt, discussed with pharmacy  -VVS following > possible angiogram on 8/15    AAA  PAD  Non-Healing LE Wounds  CTA 10/2022 with occlusion of proximal SFA on L. S/p EVAR for AAA requiring bilateral common femoral endarterectomies -appreciate VVS    CKD IIIb  -per TRH    Essential Thrombocytosis  Hx Lacunar Strokes, Seizures  -per TRH  -follows with Hematology as an outpatient   Hypothyroidism  -per TRH    Microcytic Anemia / IDA  -per TRH    Hx ETOH Abuse  -monitor / supportive care    Adult Failure to Thrive  Deconditioning  -appreciate Palliative Care > pt would like to continue to work toward rehab  efforts  -DNR / DNI otherwise  -placement per TRH, appreciate assistance in patient care     For questions or updates, please contact [name] Please consult [email or web address] for contact info under     Signed, Taylor Barrack, NP-C, AGACNP-BC [facility] - Electrophysiology  01/10/2024, 9:02 AM   I have seen and examined this patient with Taylor Taylor [name].  Agree with above, note added to reflect my findings.  No further ventricular arrhythmias.  Volume status appears stable.  Patient's only complaint is lower extremity pain.  GEN: No acute distress.   Neck: No JVD Cardiac: RRR, no murmurs, rubs, or gallops.  Respiratory: normal BS bases bilaterally. GI: Soft, nontender, non-distended  MS: No edema; No  deformity. Neuro:  Nonfocal  Skin: warm and dry, device site well healed Psych: Normal affect    Ventricular tachycardia arrest: Patient has ICD in place.  Received ICD shocks.  Was severely hypokalemic but this is now improved.  Continue amiodarone  200 mg twice daily and mexiletine at the current dose. Acute on chronic systolic heart failure: Ejection fraction severely reduced.  Plan per heart failure cardiology Mechanical aortic valve: Warfarin on hold.  Starting heparin  today. Peripheral arterial disease: Plan for angiogram possibly tomorrow.  Plan per vascular surgery  EP to sign off for now.  If further arrhythmias recur, would be happy to sign back on.  Taylor Taylor [name] arrange for follow-up in clinic.  Taylor Taylor [name] M. Taylor Schmuhl MD 01/10/2024 1:58 PM

## 2024-01-10 NOTE — Progress Notes (Signed)
 PHARMACY - ANTICOAGULATION  Pharmacy Consult for heparin  Indication: AVR/Afib  Allergies  Allergen Reactions   Erythromycin Rash   Zithromax [Azithromycin] Rash    Patient Measurements: Height: 5' 9 (175.3 cm) Weight: 58.1 kg (128 lb) IBW/kg (Calculated) : 70.7 HEPARIN  DW (KG): 66.1  Vital Signs: Temp: 97.6 F (36.4 C) (08/14 1941) Temp Source: Oral (08/14 1941) BP: 104/56 (08/14 1941) Pulse Rate: 69 (08/14 1941)  Labs: Recent Labs    01/08/24 0520 01/09/24 0534 01/10/24 0545 01/10/24 1800  HGB 8.3* 9.5*  --   --   HCT 25.7* 30.1*  --   --   PLT 521* 632*  --   --   LABPROT 26.2* 23.3* 19.8*  --   INR 2.3* 2.0* 1.6*  --   HEPARINUNFRC <0.10*  --   --  >1.10*  CREATININE 1.80* 2.01* 1.75*  --     Estimated Creatinine Clearance: 30.9 mL/min (A) (by C-G formula based on SCr of 1.75 mg/dL (H)).   Assessment: 73 y.o. male with h/o Afib and mAVR on warfarin PTA. INR down to 1.6 after holding, pharmacy consulted for IV heparin .  Heparin  level >1.1 (drawn incorrectly from same arm where heparin  running). Redraw heparin  level undetectable on infusion at 1050 units/hr. No issues with line or bleeding reported per RN.  Goal of Therapy:  INR 2-3 as per outpatient visit  Heparin  level 0.3-0.7 units/ml Monitor platelets by anticoagulation protocol: Yes   Plan:  Increase heparin  to 1250 units/hr Check heparin  level in 8h  Vito Ralph, PharmD, BCPS Please see amion for complete clinical pharmacist phone list 01/10/2024 10:29 PM

## 2024-01-10 NOTE — Progress Notes (Addendum)
 Daily Progress Note   Patient Name: Taylor [name] [facility]       Date: 01/10/2024 DOB: [birth year]  Age: 74 y.o. MRN#: [id number] Attending Physician: Fairy Frames, MD Primary Care Physician: Joella Sieving, DO Admit Date: 01/02/2024  Reason for Consultation/Follow-up: Establishing goals of care  Subjective: Medical records reviewed including progress notes, labs and imaging. Patient assessed at the bedside. He initially has no complaints, only at a bite of his sausage. No visitors present during my visit. Complains of leg pain with repositioning.   Created space and opportunity for patient's thoughts and feelings on his current illness. He is agreeable to a review of his acute and chronic illnesses. Reviewed course of hospitalization. I explained that he has end stage CHF and end stage vascular disease, which is important to consider through the decision-making process given the high risk/likelihood that additional interventions are not likely to improve his QOL or help him reach his goal of regaining independence. He did not have much to say about this other than confirming his understanding. He also confirms his goal remains to attempt to rehabilitation.  I then attempted to explore his experience with previous vascular procedures and how this might impact his care preferences moving forward. I re-introduced the option of focusing on his comfort and quality of life to relieve pain and suffering. He does not respond to my questions and deflects to another attempt at repositioning his legs. He stated I am in so much pain that I can hardly think. We discussed current pain management regimen and he verbalized his understanding.   I offered to arrange a family meeting for additional support as he considers all of his options and GOC. He told me I am  not sure. I shared that it would be very helpful for his loved ones to hear the same information and help him think through his priorities and preferences during such a difficult point in his illness and encouraged him to consider. Discussed with primary attending and AHF NP.   Addendum: Received update from HF NP that after discussion with their team, patient agreeable to canceling angiogram and open to a hospice facility upon discharge.  Returned to the bedside to support at their request.  Son and daughter-in-law were present visiting and I introduced role of PMT.  We reviewed my conversation with him this morning and poor prognosis, likelihood that rehab would cause him more pain and prolonged suffering without attaining his goals of increased independence.  Patient understands hospice philosophy and states I think so when asked if this is aligned with his goals, while also stating I have not thought it through enough.  Patient's son attempted to call his sister/HCPOA [name] and provide her with an update.  They would like to discuss further as a family tonight and continue goals of care discussions tomorrow.  Provided with PMT contact information.  Questions and concerns addressed. PMT will continue to support holistically.   Length of Stay: 8   Physical Exam Vitals and nursing note reviewed.  Constitutional:      General: He is not in acute distress.    Appearance: He is ill-appearing.  HENT:     Head: Normocephalic and atraumatic.  Cardiovascular:     Rate and Rhythm: Normal rate.  Pulmonary:     Effort: Pulmonary effort is normal.  Neurological:     Mental Status: He is alert.  Psychiatric:        Mood and Affect: Affect is flat.             Vital Signs: BP 113/64 (BP Location: Left Arm)   Pulse 72   Temp 97.9 F (36.6 C) (Oral)   Resp 15   Ht 5' 9 (1.753 m)   Wt 58.1 kg   SpO2 99%   BMI 18.90 kg/m  SpO2: SpO2: 99 % O2 Device: O2 Device: Room Air O2 Flow Rate: O2  Flow Rate (L/min): 2 L/min      Palliative Assessment/Data: 20%   Palliative Care Assessment & Plan   Patient Profile: 73 y.o. male  with past medical history of AoV abnormality s/p mechanical AVR (on coumadin ), h/o VF arrest in 2017 s/p CRT-D, CAD s/p CABG x3 + redo mAVR, chronic systolic HF (EF 20-25%), paroxysmal AF, AAA s/p EVAR with b/l fem endarterectomies c/b thrombosis of L fem and foot ulcer, essential thrombosis, ETOH, cocaine, and tobacco abuse admitted on 01/02/2024 with ICD shocks/VT.    Patient initially presented to [facility] in [place].  Transferred here and was also found to have acute on chronic systolic CHF.  Acute on chronic anemia.  Electrolyte disturbances, concerns with his chronic wounds secondary to PAD and consideration of angiogram.   PMT has been consulted to assist with goals of care conversation.  Assessment: Goals of care conversation Acute on Chronic Systolic Heart Failure VT w/ ICD Shocks PAD with nonhealing lower extremity wounds and rest pain Mechanical AV  Failure to Thrive   Recommendations/Plan: Continue DNR/DNI Provided education on progressive nature of patient's diseases and realistic expectations for end-stage HF and vascular disease Patient minimally participates in GOC discussion today though confirmed he wants to attempt to improve his strength. Did not seem ready for comfort focus when the option was presented Strongly encouraged a family meeting for further GOC discussion - he is hesitant but will consider Psychosocial and emotional support provided PMT will continue to follow and support   Prognosis:  Poor long-term prognosis without further treatment options and functional decline, several chronic comorbidities   Discharge Planning: To Be Determined    Lilliah Priego SHAUNNA Fell, PA-C  Palliative Medicine Team Team phone # [phone]  Thank you for allowing the Palliative Medicine Team to assist in the care of this patient.  Please utilize secure chat with additional questions, if there is no response within 30 minutes please call the above phone number.  Palliative Medicine Team providers are available by phone from 7am to 7pm daily and can be reached through the team cell phone.  Should this patient require assistance outside of these hours, please call the patient's attending physician.    Time Total: 50  Visit consisted of counseling and education dealing with the complex and emotionally intense issues of symptom management and palliative care in the setting of serious and potentially life-threatening illness. Greater than 50% of this time was spent counseling and coordinating care related to the above assessment and plan.  Personally spent 50 minutes in patient care including extensive chart review (labs, imaging, progress/consult notes, vital signs), medically appropraite exam, discussed with treatment team, education to patient, family, and staff, documenting clinical information, medication review and management, coordination of care, and available advanced directive documents.    Additional time:  30 minutes

## 2024-01-10 NOTE — Progress Notes (Signed)
 Physical Therapy Treatment Patient Details Name: Taylor [name] [facility] MRN: [id number] DOB: March 30, 1951 Today's Date: 01/10/2024   History of Present Illness 73 y/o M admitted on 01/02/24 after presenting from outside hospital after being seen for evaluation of ICD shock. Pt is being treated for ventricular tachycardia. 8/11 resting R ABI indicates non-compressible arteries and angiogram showing RLE 50-74% stenosis in superficial femoral artery. PMH: CAD s/p CABG, HFrEF, s/p AICD, mechanical AV placement on coumadin , essential thrombocytosis, HTN, AAA s/p EVAR 10/2022 with B common femoral endarterectomies c/b L femoral artery thrombosis s/p redo L ileofemoral endarterectomy & profundaplasty with L GSV vein patch.    PT Comments  Pt received in supine, lethargic but participatory as able with encouragement. Pt limited due to fatigue and c/o bil foot pain once more awake, pt needing HOB elevated to awaken more fully and able to perform some supine LE A/AAROM in pain-limited range (HEP handout given to encourage pt/family/staff to continue working on ROM) and pt able to assist with pulling himself up in bed from bed trend posture with overhead rail. Ineffective with attempts at long sitting or lateral scooting with +1 assist in bed chair posture, needing totalA with bed pad to attempt. Will need at least +2 assist for EOB/unsupported sitting trials once pt more alert. RN notified pt can take bites of food but needs max encouragement and set-up assist to continue eating. Patient will benefit from continued inpatient follow up therapy, <3 hours/day.    If plan is discharge home, recommend the following: Two people to help with walking and/or transfers;A lot of help with bathing/dressing/bathroom;Assistance with cooking/housework;Help with stairs or ramp for entrance;Assist for transportation   Can travel by private vehicle     No  Equipment Recommendations  Other (comment) (hospital air bed, hoyer lift,  wheelchair with elevating removable arm and leg rests)    Recommendations for Other Services       Precautions / Restrictions Precautions Precautions: Fall Recall of Precautions/Restrictions: Impaired Precaution/Restrictions Comments: Prevalon boots Restrictions Weight Bearing Restrictions Per Provider Order: No     Mobility  Bed Mobility Overal bed mobility: Needs Assistance Bed Mobility: Supine to Sit, Sit to Supine, Rolling Rolling: Used rails, Max assist   Supine to sit: HOB elevated, Used rails, Total assist     General bed mobility comments: Pt with poor tolerance for attempts at long sitting from bed chair posture, possibly due to grogginess/lethargy, needing totalA for forward lean/unsupported back posture. Pt did perform posterior supine scoot toward HOB wtih BUE supported by overhead rail and bed in trend with mod/maxA. Bed in chair posture with HOB ~80 degrees and forward tilted to promote best posture for eating and prevalon boots re-applied for better neutral hip posture and knee extension once pt done eating, they had slid around and were not properly fitted when PTA arrived.    Transfers Overall transfer level:  (deferred 2/2 BLE pain)                 General transfer comment: unable due to pain    Ambulation/Gait                   Stairs             Wheelchair Mobility     Tilt Bed    Modified Rankin (Stroke Patients Only)       Balance Overall balance assessment: Needs assistance Sitting-balance support: Feet supported, Bilateral upper extremity supported, Single extremity supported Sitting balance-Leahy Scale: Poor Sitting  balance - Comments: reliant on back support today, drowsy throughout and not safe to attempt EOB with only +1 assist due to pt lethargy and limited command following.                                    Communication Communication Communication: Impaired Factors Affecting Communication:  Reduced clarity of speech;Difficulty expressing self  Cognition Arousal: Lethargic Behavior During Therapy: Flat affect   PT - Cognitive impairments: Initiation, Awareness, Safety/Judgement, Sequencing, Problem solving, Attention, Memory                       PT - Cognition Comments: very poor initiation of mobility tasks throughout session even when given plenty of time. Pt initially lethargic but slightly more alert after HOB elevated and pt following some simple motor commands, but often needs multimodal cues to follow through. Following commands: Impaired Following commands impaired: Follows one step commands with increased time, Follows one step commands inconsistently    Cueing Cueing Techniques: Verbal cues, Visual cues, Tactile cues, Gestural cues  Exercises General Exercises - Lower Extremity Ankle Circles/Pumps: AROM, Both, 5 reps, Supine (limited ROM with max encouragement, pt not able to tolerate AAROM) Short Arc Quad: AAROM, Both, 10 reps, Other (comment) (bed in chair posture) Heel Slides: AAROM, Both, 5 reps, Supine Hip ABduction/ADduction: AAROM, Both, 5 reps, Supine (limited tolerance 2/2 pain) Straight Leg Raises: AAROM, Both, 5 reps, Supine Other Exercises Other Exercises: supine general strengthening handout given to encourage pt to continue AROM as able    General Comments General comments (skin integrity, edema, etc.): BP 124/60 (79) HR 71 bpm and SpO2 100% on RA with HOB ~60 degrees after repositioning more upright. Pt later positioned more upright so tray table could be set up for him to eat lunch, pt appears to only take one bite then stops, RN/NT notified he will need more encouragement to continue self-feeding.      Pertinent Vitals/Pain Pain Assessment Pain Assessment: PAINAD Breathing: normal Negative Vocalization: repeated troubled calling out, loud moaning/groaning, crying Facial Expression: facial grimacing Body Language: tense, distressed  pacing, fidgeting Consolability: distracted or reassured by voice/touch PAINAD Score: 6 Pain Location: BLE, toes the worst, R>L Pain Descriptors / Indicators: Discomfort, Grimacing, Guarding, Sore Pain Intervention(s): Limited activity within patient's tolerance, Monitored during session, Repositioned, Other (comment) (per RN, pt too lethargic to give pain meds other than Tramadol  prior to session)    Home Living                          Prior Function            PT Goals (current goals can now be found in the care plan section) Acute Rehab PT Goals Patient Stated Goal: decreased pain, return to PLOF PT Goal Formulation: With patient/family Time For Goal Achievement: 01/21/24 Progress towards PT goals: Not progressing toward goals - comment (pain/lethargy limiting pt participation)    Frequency    Min 2X/week      PT Plan      Co-evaluation              AM-PAC PT 6 Clicks Mobility   Outcome Measure  Help needed turning from your back to your side while in a flat bed without using bedrails?: A Lot Help needed moving from lying on your back to sitting on the side of  a flat bed without using bedrails?: Total Help needed moving to and from a bed to a chair (including a wheelchair)?: Total Help needed standing up from a chair using your arms (e.g., wheelchair or bedside chair)?: Total Help needed to walk in hospital room?: Total Help needed climbing 3-5 steps with a railing? : Total 6 Click Score: 7    End of Session Equipment Utilized During Treatment: Other (comment) (bil Prevalon boots readjusted to reduce risk of hip ER and ensure heels in proper area of boot) Activity Tolerance: Patient limited by pain;Patient limited by lethargy Patient left: in bed;with call bell/phone within reach;with bed alarm set;Other (comment) (x4 rails up per air bed, bed in chair posture) Nurse Communication: Mobility status;Need for lift equipment;Patient requests pain  meds;Other (comment) (pt needs Supervision/encouragement to eat his food) PT Visit Diagnosis: Muscle weakness (generalized) (M62.81);Other abnormalities of gait and mobility (R26.89);Difficulty in walking, not elsewhere classified (R26.2);Pain Pain - part of body: Leg;Ankle and joints of foot     Time: 1240-1257 PT Time Calculation (min) (ACUTE ONLY): 17 min  Charges:    $Therapeutic Exercise: 8-22 mins PT General Charges $$ ACUTE PT VISIT: 1 Visit                     Jaclyn Carew P., PTA Acute Rehabilitation Services Secure Chat Preferred 9a-5:30pm Office: [phone]    Connell HERO [facility] 01/10/2024, 2:04 PM

## 2024-01-10 NOTE — Progress Notes (Addendum)
 Advanced Heart Failure Rounding Note  HF Cardiologist: Dr. Cherrie   Chief Complaint: acute on chronic systolic heart failure and VT  Patient Profile   Taylor [name] is a 73 y.o male with mechnical AVR on chronic coumadin , h/o VF arrest s/p ICD, CAD s/p CABG, chronic systolic HF (EF 20-25%), AAA, h/o essential thrombosis and severe PAD w/ chronic nonhealing wounds, S/p EVAR for AAA requiring bilateral common femoral endarterectomies, admitted for VT w/ ICD shocks and a/c CHF w/ volume overload. W/ FTT prior to admit and had stopped taking many of his medications.   Subjective:    CO-OX 63>49% today  Now on PO diuretics. CVP 7/8.   Not very talkative this morning. Only able to really tell me his legs hurt. Denies SOB and CP. Resting in bed, groans when he moves his legs.   Objective:    Weight Range: 58.1 kg Body mass index is 18.9 kg/m.   Vital Signs:   Temp:  [97.1 F (36.2 C)-98.2 F (36.8 C)] 97.9 F (36.6 C) (08/14 0739) Pulse Rate:  [70-72] 72 (08/14 0739) Resp:  [15-20] 15 (08/14 0739) BP: (90-126)/(51-64) 113/64 (08/14 0739) SpO2:  [88 %-100 %] 99 % (08/14 0739) Weight:  [58.1 kg] 58.1 kg (08/14 0300) Last BM Date : 01/09/24  Weight change: Filed Weights   01/08/24 0838 01/09/24 0500 01/10/24 0300  Weight: 66.7 kg 66.2 kg 58.1 kg   Intake/Output:  Intake/Output Summary (Last 24 hours) at 01/10/2024 0852 Last data filed at 01/10/2024 0533 Gross per 24 hour  Intake 1080 ml  Output 1750 ml  Net -670 ml    Physical Exam  General:  chronically ill appearing.  No respiratory difficulty Neck: JVD ~6 cm.  Cor: Regular rate & rhythm. No murmurs. Lungs: clear Extremities: no edema  Neuro:  Affect flat.  Telemetry   V paced 70s 0-4 PVCs/hr.   Labs    CBC Recent Labs    01/08/24 0520 01/09/24 0534  WBC 6.5 7.7  HGB 8.3* 9.5*  HCT 25.7* 30.1*  MCV 115.2* 114.9*  PLT 521* 632*   Basic Metabolic Panel Recent Labs    91/86/74 0534  01/10/24 0545  NA 132* 128*  K 3.7 3.9  CL 88* 88*  CO2 32 29  GLUCOSE 116* 115*  BUN 51* 52*  CREATININE 2.01* 1.75*  CALCIUM  9.3 9.1   Liver Function Tests No results for input(s): AST, ALT, ALKPHOS, BILITOT, PROT, ALBUMIN  in the last 72 hours.  BNP (last 3 results) Recent Labs    05/02/23 1435 08/01/23 1501 11/22/23 1240  BNP 753.7* 560.2* 1,219.5*   ProBNP (last 3 results) Recent Labs    08/29/23 1158  PROBNP 9,442*   Medications:    Scheduled Medications:  amiodarone   200 mg Oral BID   aspirin  EC  81 mg Oral Daily   Chlorhexidine  Gluconate Cloth  6 each Topical Daily   feeding supplement  237 mL Oral BID BM   furosemide   40 mg Oral Daily   Gerhardt's butt cream   Topical BID   hydroxyurea   500 mg Oral Daily   And   hydroxyurea   1,000 mg Oral Once per day on Monday Wednesday Friday   levothyroxine   100 mcg Oral Q0600   mexiletine  200 mg Oral Q12H   multivitamin with minerals  1 tablet Oral Daily   pantoprazole   40 mg Oral BID   potassium chloride   40 mEq Oral Daily   sodium chloride  flush  10-40 mL  Intracatheter Q12H   sodium chloride  flush  3 mL Intravenous Q12H   spironolactone   25 mg Oral Daily   thiamine   100 mg Oral Daily   traZODone   50 mg Oral QHS   vitamin A   10,000 Units Oral Daily   zinc  sulfate (50mg  elemental zinc )  220 mg Oral Daily    Infusions:    PRN Medications: acetaminophen , ondansetron  (ZOFRAN ) IV, oxyCODONE , sodium chloride  flush, sodium chloride  flush, traMADol   Assessment/Plan   1. VT w/ ICD Shocks - H/o VF Arrest and H/o CHB. S/p [employer] CRT-D device, followed by EP (Dr. Fernande) - Suspect VT triggered by worsening HF w/ marked volume overload + hypokalemia  - No recurrent VT - continue amio 200 mg bid - continue mexiletine 200 mg bid   - keep K > 4.0 and Mg > 2.0   2. Acute on Chronic Systolic Heart Failure - Echo (05/2016 @ [facility]): EF 40% - Longstanding EF 25-30% from 2018 to now.  - Most recent 5/25 EF  25-30%, G2DD, mildly reduced RV. - s/p CRT-D.  - Chronically NYHA III-IV mainly limited currently by LE wounds and poor mobility - Admitted w/ marked volume overload. Had missed several days of home meds (feeling poorly) - CO-OX on the lower side today, 49%  - Volume looks good.Continue po lasix  40 mg daily. - GDMT limited by hypotension and CKD.  - Stopped Farxiga  with poor mobility and wound healing - Continue Spiro 25 mg daily  - Off Coreg  with marginal CO-OX - Has failed evaluation for Barostim and CCM - Currently not candidate for advanced therapies with frailty, severe PAD/open wounds. Palliative Care consulted for ongoing GOC discussions.   3. CAD - h/o remote PCI in 1994 at [facility] - s/p LHC in 2017 in the setting of Vfib Arrest w/ 3V CAD treated w/ CABG x 3 - he denies angina. HS trop trend not c/w ACS  - Continue ASA  - statin intolerant, needs to be consider for PSK9i (likely of limited benefit at this point with poor prognosis) - holding ? blocker for now    4. Mechanical Aortic Valve - Stable on last echo  - On coumadin  PTA. INR 1.6. May need heparin  gtt.    5. PAD with nonhealing lower extremity wounds and rest pain - 6/24 EVAR for AAA requiring bilateral common femoral endarterectomies.  He had to go back on 11/10/2022 for a thrombosed left common femoral artery after endarterectomy. Then went to [facility] for left fem-pop. Course c/b LLE foot ulcer - He had been trying to get in w/ [facility] due to concern for potential need for amputation and effort for limb salvage - Continues to struggle with nonhealing wounds. Has been followed at [facility] and here. - Peripheral angiography, initially aborted d/t elevated INR. Patient now wants to proceed with this. Plan for angiogram possibly tomorrow.    6. IDA - Hgb 9.0>>7.7>>8.2>>8.3 - Got 2 units PRBCs at hospital in [place].   - Ferritin 79, T sat 12% s/p IV Fe.   7. Essential thrombocytosis - On hydroxyurea   GOC: Very limited  conversations with patient and team. At this time he wants everything done to try to help his pain and legs. Plan to remain DNR/DNI.   Length of Stay: 8  Beckey LITTIE Coe, NP  01/10/2024, 8:52 AM  Advanced Heart Failure Team Pager [phone] (M-F; 7a - 5p)  Please contact [facility] for night-coverage after hours (5p -7a ) and weekends on [email or web address]  Patient  seen and examined with the above-signed Advanced Practice Provider and/or Housestaff. I personally reviewed laboratory data, imaging studies and relevant notes. I independently examined the patient and formulated the important aspects of the plan. I have edited the note to reflect any of my changes or salient points. I have personally discussed the plan with the patient and/or family.  Frail. Lethargic but communicative. Co-ox down to 49%. Denies CP or SOB. Having pain in his feet.  INR 1.6. Heparin  started.   General:  Frail. Chronically-ill appearing. No resp difficulty HEENT: normal Neck: supple. JVP flat.  'Cor: Regular rate & rhythm. No rubs, gallops or murmurs. Lungs: clear Abdomen: soft, nontender, nondistended. No hepatosplenomegaly. No bruits or masses. Good bowel sounds. Extremities: no cyanosis, clubbing, rash, edema LEs wrapped Neuro: alert & orientedx3, cranial nerves grossly intact. moves all 4 extremities w/o difficulty. Affect pleasant  He is actively dying from his HF. I discussed that we have no options to meaning fully improve his situation and that I felt that proceeding with further aggressive therapies was not likely to help him and actually may be more of a risk. I have suggested a transition to comfort care to focus on his pain and fatigue.He is agreeable to this and interested in potential [facility] care. We will notify the VVS and Palliative Care teams.   Toribio Fuel, MD  4:58 PM

## 2024-01-10 NOTE — Progress Notes (Signed)
 PROGRESS NOTE    Taylor [name] [facility]  FMW:[id number] DOB: December 02, 1950 DOA: 01/02/2024 PCP: Joella Sieving, DO  73 yo with hx CAD s/p CABG, HFrEF (25-30%), s/p AICD, mechanical AV placement on coumadin , essential thrombocytosis, HTN, AAA s/p EVAR 10/2022 with bilateral common femoral endarterectomies c/b L femoral artery thrombosis s/p redo L ileofemoral endarterectomy and profundaplasty with L GSV vein patch presenting as a transfer from [facility] in [place] after being seen for evaluation of ICD shock.   - Seen by EP and heart failure team, electrolytes repleted, continued on amiodarone  and mexiletine for VT -Improving with diuresis -Also issues with chronic wounds related to PAD, vascular was considering angiogram when INR acceptable  - Palliative care following considering significant core morbidities, poor prognosis and long-term outlook look despite medical management   Subjective: - Feels fair, no events overnight, lower leg appears to be contracted  Assessment and Plan:  VT with ICD Shocks Per cards, thought VT triggered by HF exacerbation + hypokalemia Amiodarone , mexiletine Monitor lytes, K>4, mag>2    Acute on Chronic Systolic Heart Failure Echo 09/2023 with EF 25-30% grade 2 DD, mildly reduced RV function -CHF team following, improved with diuresis, transitioning to oral diuretics -Continue Aldactone , felt to be poor candidate for Farxiga , off Coreg  as well -Overall prognosis is poor, not a candidate for advanced therapies, palliative consulted for goals of care   Mechanical Aortic Valve -Warfarin on hold, INR 1.6 today, starting heparin , plan for angiogram tomorrow   CKD IIIb  Fluctuating recently, baseline appears to be around 1.5-2 trend   CAD s/p CABG PAD AAA Nonhealing LE Wounds -Intolerant to aspirin  and statin CTA 10/2022 with occlusion of proximal SFA on L  S/p EVAR for AAA requiring bilateral common femoral endarterectomies Appreciate vascular eval -  considering angiogram pending Pall eval - ABI with falsely elevated L ABI, noncompressible RLE arteries. LE arterial US  with 50-74% stenosis in superficial femoral artery (multiple lesions throughout superfiical femoral artery).  LLE with patent L femoral artery to popliteal artery bypass graft with no hemodynamically significant stenosis.   Essential Thrombocytosis  H/o Lacunar Strokes  Hx Seizures Followed by hematology - looks like he follows with [facility] (last note 08/29/2023) Hydroxyurea     Hypothyroidism TSH 17 -> synthroid  increased to 100 mcg, needs repeat labs in 4-6 weeks   Macrocytic Anemia Baseline appears around 9 S/p 2 units pRBC at [facility] (with presenting Hb 7) -now stable   Elevated LFT's Mild, suspect due to congestion with HF above Trend with diuresis   Splenomegaly Noted on US    Hx Etoh Abuse Denied current use       DVT prophylaxis: INR therapeutic, heparin  gtt when INR <2 Code Status: DNR Family Communication: No family at bedside, will update sister  Disposition Plan: To be determined  Objective: Vitals:   01/09/24 2312 01/10/24 0005 01/10/24 0300 01/10/24 0739  BP: (!) 90/53  126/64 113/64  Pulse: 70  71 72  Resp: 20  20 15   Temp: (!) 97.1 F (36.2 C)  97.8 F (36.6 C) 97.9 F (36.6 C)  TempSrc: Axillary  Oral Oral  SpO2: (!) 88% 93% 100% 99%  Weight:   58.1 kg   Height:        Intake/Output Summary (Last 24 hours) at 01/10/2024 1256 Last data filed at 01/10/2024 0533 Gross per 24 hour  Intake 840 ml  Output 1750 ml  Net -910 ml   Filed Weights   01/08/24 0838 01/09/24 0500 01/10/24 0300  Weight:  66.7 kg 66.2 kg 58.1 kg    Examination:  General exam: Frail elderly chronically ill, mild deficits HEENT: No JVD Respiratory system: Clear to auscultation Cardiovascular system: S1 & S2 heard, RRR.  Abd: nondistended, soft and nontender.Normal bowel sounds heard. Central nervous system: Alert and oriented. No focal neurological  deficits. Extremities: Bilateral lower legs wounds with dressing Skin: As above Psychiatry: Flat affect    Data Reviewed:   CBC: Recent Labs  Lab 01/05/24 0315 01/06/24 0500 01/07/24 0520 01/08/24 0520 01/09/24 0534  WBC 5.9 6.8 5.7 6.5 7.7  HGB 8.3* 8.2* 8.0* 8.3* 9.5*  HCT 25.5* 25.4* 25.0* 25.7* 30.1*  MCV 114.9* 114.4* 115.2* 115.2* 114.9*  PLT 405* 442* 469* 521* 632*   Basic Metabolic Panel: Recent Labs  Lab 01/06/24 0500 01/07/24 0520 01/08/24 0520 01/09/24 0534 01/10/24 0545  NA 133* 131* 130* 132* 128*  K 3.8 3.7 4.6 3.7 3.9  CL 90* 89* 89* 88* 88*  CO2 29 33* 32 32 29  GLUCOSE 112* 102* 126* 116* 115*  BUN 49* 51* 53* 51* 52*  CREATININE 1.69* 1.86* 1.80* 2.01* 1.75*  CALCIUM  8.6* 8.6* 8.7* 9.3 9.1   GFR: Estimated Creatinine Clearance: 30.9 mL/min (A) (by C-G formula based on SCr of 1.75 mg/dL (H)). Liver Function Tests: Recent Labs  Lab 01/05/24 0315  AST 50*  ALT 61*  ALKPHOS 70  BILITOT 1.2  PROT 6.2*  ALBUMIN  2.2*   No results for input(s): LIPASE, AMYLASE in the last 168 hours. No results for input(s): AMMONIA in the last 168 hours. Coagulation Profile: Recent Labs  Lab 01/06/24 1600 01/07/24 0520 01/08/24 0520 01/09/24 0534 01/10/24 0545  INR 2.1* 2.1* 2.3* 2.0* 1.6*   Cardiac Enzymes: No results for input(s): CKTOTAL, CKMB, CKMBINDEX, TROPONINI in the last 168 hours. BNP (last 3 results) Recent Labs    08/29/23 1158  PROBNP 9,442*   HbA1C: No results for input(s): HGBA1C in the last 72 hours. CBG: No results for input(s): GLUCAP in the last 168 hours. Lipid Profile: No results for input(s): CHOL, HDL, LDLCALC, TRIG, CHOLHDL, LDLDIRECT in the last 72 hours. Thyroid  Function Tests: No results for input(s): TSH, T4TOTAL, FREET4, T3FREE, THYROIDAB in the last 72 hours. Anemia Panel: No results for input(s): VITAMINB12, FOLATE, FERRITIN, TIBC, IRON , RETICCTPCT in the last  72 hours. Urine analysis:    Component Value Date/Time   COLORURINE YELLOW 11/16/2022 1700   APPEARANCEUR CLEAR 11/16/2022 1700   LABSPEC 1.011 11/16/2022 1700   PHURINE 5.0 11/16/2022 1700   GLUCOSEU >=500 (A) 11/16/2022 1700   HGBUR NEGATIVE 11/16/2022 1700   BILIRUBINUR NEGATIVE 11/16/2022 1700   KETONESUR NEGATIVE 11/16/2022 1700   PROTEINUR NEGATIVE 11/16/2022 1700   NITRITE NEGATIVE 11/16/2022 1700   LEUKOCYTESUR NEGATIVE 11/16/2022 1700   Sepsis Labs: @LABRCNTIP (procalcitonin:4,lacticidven:4)  )No results found for this or any previous visit (from the past 240 hours).   Radiology Studies: No results found.    Scheduled Meds:  amiodarone   200 mg Oral BID   aspirin  EC  81 mg Oral Daily   Chlorhexidine  Gluconate Cloth  6 each Topical Daily   feeding supplement  237 mL Oral BID BM   furosemide   40 mg Oral Daily   Gerhardt's butt cream   Topical BID   hydroxyurea   500 mg Oral Daily   And   hydroxyurea   1,000 mg Oral Once per day on Monday Wednesday Friday   levothyroxine   100 mcg Oral Q0600   mexiletine  200 mg Oral Q12H  multivitamin with minerals  1 tablet Oral Daily   pantoprazole   40 mg Oral BID   potassium chloride   40 mEq Oral Daily   sodium chloride  flush  10-40 mL Intracatheter Q12H   sodium chloride  flush  3 mL Intravenous Q12H   spironolactone   25 mg Oral Daily   thiamine   100 mg Oral Daily   traZODone   50 mg Oral QHS   vitamin A   10,000 Units Oral Daily   zinc  sulfate (50mg  elemental zinc )  220 mg Oral Daily   Continuous Infusions:  heparin  1,050 Units/hr (01/10/24 1152)     LOS: 8 days    Time spent:    Sigurd Pac, MD [facility]   01/10/2024, 12:56 PM

## 2024-01-11 ENCOUNTER — Telehealth: Payer: Self-pay

## 2024-01-11 DIAGNOSIS — I5043 Acute on chronic combined systolic (congestive) and diastolic (congestive) heart failure: Secondary | ICD-10-CM | POA: Diagnosis not present

## 2024-01-11 DIAGNOSIS — I472 Ventricular tachycardia, unspecified: Secondary | ICD-10-CM | POA: Diagnosis not present

## 2024-01-11 DIAGNOSIS — Z7189 Other specified counseling: Secondary | ICD-10-CM | POA: Diagnosis not present

## 2024-01-11 DIAGNOSIS — Z515 Encounter for palliative care: Secondary | ICD-10-CM | POA: Diagnosis not present

## 2024-01-11 LAB — BASIC METABOLIC PANEL WITH GFR
Anion gap: 11 (ref 5–15)
BUN: 51 mg/dL — ABNORMAL HIGH (ref 8–23)
CO2: 26 mmol/L (ref 22–32)
Calcium: 9.3 mg/dL (ref 8.9–10.3)
Chloride: 93 mmol/L — ABNORMAL LOW (ref 98–111)
Creatinine, Ser: 1.68 mg/dL — ABNORMAL HIGH (ref 0.61–1.24)
GFR, Estimated: 43 mL/min — ABNORMAL LOW (ref >60)
Glucose, Bld: 103 mg/dL — ABNORMAL HIGH (ref 70–99)
Potassium: 4.4 mmol/L (ref 3.5–5.1)
Sodium: 130 mmol/L — ABNORMAL LOW (ref 135–145)

## 2024-01-11 LAB — COOXEMETRY PANEL
Carboxyhemoglobin: 1.8 % — ABNORMAL HIGH (ref 0.5–1.5)
Methemoglobin: 0.9 % (ref 0.0–1.5)
O2 Saturation: 50.9 %
Total hemoglobin: 9.9 g/dL — ABNORMAL LOW (ref 12.0–16.0)

## 2024-01-11 MED ORDER — GLYCOPYRROLATE 0.2 MG/ML IJ SOLN
0.2000 mg | INTRAMUSCULAR | Status: DC | PRN
Start: 2024-01-11 — End: 2024-01-11

## 2024-01-11 MED ORDER — GLYCOPYRROLATE 0.2 MG/ML IJ SOLN: 0.2000 mg | INTRAMUSCULAR | Status: DC | PRN

## 2024-01-11 MED ORDER — ACETAMINOPHEN 325 MG PO TABS
650.0000 mg | ORAL_TABLET | Freq: Four times a day (QID) | ORAL | Status: DC | PRN
Start: 2024-01-11 — End: 2024-01-11

## 2024-01-11 MED ORDER — HALOPERIDOL LACTATE 2 MG/ML PO CONC: 2.0000 mg | Freq: Four times a day (QID) | ORAL | Status: DC | PRN

## 2024-01-11 MED ORDER — ACETAMINOPHEN 650 MG RE SUPP: 650.0000 mg | Freq: Four times a day (QID) | RECTAL | Status: DC | PRN

## 2024-01-11 MED ORDER — BIOTENE DRY MOUTH MT LIQD: 15.0000 mL | Freq: Two times a day (BID) | OROMUCOSAL | Status: DC

## 2024-01-11 MED ORDER — LORAZEPAM 1 MG PO TABS: 1.0000 mg | ORAL_TABLET | ORAL | Status: DC | PRN

## 2024-01-11 MED ORDER — POLYVINYL ALCOHOL 1.4 % OP SOLN: 1.0000 [drp] | Freq: Four times a day (QID) | OPHTHALMIC | Status: DC | PRN

## 2024-01-11 MED ORDER — HALOPERIDOL LACTATE 5 MG/ML IJ SOLN: 2.0000 mg | INTRAMUSCULAR | Status: DC | PRN

## 2024-01-11 MED ORDER — DIPHENHYDRAMINE HCL 50 MG/ML IJ SOLN: 25.0000 mg | INTRAMUSCULAR | Status: DC | PRN

## 2024-01-11 MED ORDER — GLYCOPYRROLATE 1 MG PO TABS: 1.0000 mg | ORAL_TABLET | ORAL | Status: DC | PRN

## 2024-01-11 MED ORDER — MORPHINE SULFATE (PF) 2 MG/ML IV SOLN
1.0000 mg | INTRAVENOUS | Status: DC | PRN
  Administered 2024-01-11: 2 mg via INTRAVENOUS
  Filled 2024-01-11: qty 1

## 2024-01-11 MED ORDER — HALOPERIDOL 1 MG PO TABS
2.0000 mg | ORAL_TABLET | Freq: Four times a day (QID) | ORAL | Status: DC | PRN
Start: 2024-01-11 — End: 2024-01-11

## 2024-01-11 MED ORDER — OXYCODONE HCL 5 MG PO TABS
5.0000 mg | ORAL_TABLET | ORAL | Status: DC
  Administered 2024-01-11 (×2): 5 mg via ORAL
  Filled 2024-01-11 (×2): qty 1

## 2024-01-11 MED ORDER — LORAZEPAM 2 MG/ML PO CONC: 1.0000 mg | ORAL | Status: DC | PRN

## 2024-01-11 MED ORDER — OXYCODONE HCL 5 MG PO TABS: 5.0000 mg | ORAL_TABLET | ORAL | Status: DC | PRN

## 2024-01-11 MED ORDER — LORAZEPAM 2 MG/ML IJ SOLN: 1.0000 mg | INTRAMUSCULAR | Status: DC | PRN

## 2024-01-11 MED ORDER — ONDANSETRON 4 MG PO TBDP
4.0000 mg | ORAL_TABLET | Freq: Four times a day (QID) | ORAL | Status: DC | PRN
Start: 2024-01-11 — End: 2024-01-11

## 2024-01-11 MED ORDER — ONDANSETRON HCL 4 MG/2ML IJ SOLN: 4.0000 mg | Freq: Four times a day (QID) | INTRAMUSCULAR | Status: DC | PRN

## 2024-01-11 NOTE — Progress Notes (Addendum)
 Contacted by primary team / [facility] to turn off ICD therapies.  Patient and family met with Palliative Care and elected to move toward comfort measures. Plan of care reviewed with Dr. Cindie.    With industry support, ICD therapies turned off.  Alerts turned off as well. Device Clinic notified.    Daphne Barrack, NP-C, AGACNP-BC [facility] - Electrophysiology  01/11/2024, 3:34 PM

## 2024-01-11 NOTE — Discharge Summary (Signed)
 Physician Discharge Summary  Taylor [name] [facility] FMW:[id number] DOB: [birth year] DOA: 01/02/2024  PCP: Taylor Sieving, DO  Admit date: 01/02/2024 Discharge date: 01/11/2024  Time spent: 45 minutes  Recommendations for Outpatient Follow-up:  Residential hospice for comfort focused care   Discharge Diagnoses:  Principal Problem:   Ventricular tachycardia (HCC) Acute on chronic combined systolic and diastolic CHF CAD/CABG   H/O mechanical aortic valve replacement   Protein-calorie malnutrition, severe Adult failure to thrive   Cardiac defibrillator in place Severe peripheral vascular disease Ischemic wounds  Discharge Condition: Guarded  Diet recommendation: Come for feeds  Filed Weights   01/09/24 0500 01/10/24 0300 01/11/24 0342  Weight: 66.2 kg 58.1 kg 59 kg    History of present illness:  73 yo with hx CAD s/p CABG, HFrEF (25-30%), s/p AICD, mechanical AV placement on coumadin , essential thrombocytosis, HTN, AAA s/p EVAR 10/2022 with bilateral common femoral endarterectomies c/b L femoral artery thrombosis s/p redo L ileofemoral endarterectomy and profundaplasty with L GSV vein patch presenting as a transfer from [facility] in [place] after being seen for evaluation of ICD shock.   - Seen by EP and heart failure team, electrolytes repleted, continued on amiodarone  and mexiletine for VT -Improving with diuresis -Also issues with chronic wounds related to PAD, vascular was considering angiogram when INR acceptable  - Palliative care following considering significant core morbidities, poor prognosis and long-term outlook look despite medical management  Hospital Course:   VT with ICD Shocks  VT likely triggered by HF exacerbation + hypokalemia Amiodarone , mexiletine Monitor lytes, K>4, mag>2    Acute on Chronic Systolic Heart Failure Echo 09/2023 with EF 25-30% grade 2 DD, mildly reduced RV function -CHF team following, diuresed with IV Lasix  -Overall prognosis is poor,  not a candidate for advanced therapies, palliative consulted for goals of care -After much discussion with palliative care, Dr. Cherrie and family decision made for comfort focused care   Mechanical Aortic Valve   CKD IIIb    CAD s/p CABG PAD AAA Nonhealing LE Wounds -Intolerant to aspirin  and statin CTA 10/2022 with occlusion of proximal SFA on L  S/p EVAR for AAA requiring bilateral common femoral endarterectomies Appreciate vascular eval -  - Initially there was some consideration for aortic angiogram -However in the setting of significant debility, ongoing failure to thrive, advanced heart failure palliative care was recommended, family meeting completed, now plan for comfort focused care   Essential Thrombocytosis  H/o CVA  Hx Seizures   Hypothyroidism TSH 17 -> synthroid  increased to 100 mcg   Macrocytic Anemia Baseline appears around 9 S/p 2 units pRBC at [facility] (with presenting Hb 7)   Elevated LFT's Mild, suspect due to congestion with HF above   Splenomegaly Noted on US    Hx Etoh Abuse Denied current use    Discharge Exam: Vitals:   01/11/24 0342 01/11/24 0749  BP: 114/62 (!) 100/52  Pulse: 73 70  Resp: 18 17  Temp: 97.9 F (36.6 C) 98 F (36.7 C)  SpO2: 98% 96%   General exam: Frail elderly chronically ill, mild deficits HEENT: No JVD Respiratory system: Clear to auscultation Cardiovascular system: S1 & S2 heard, RRR.  Abd: nondistended, soft and nontender.Normal bowel sounds heard. Central nervous system: Alert and oriented. No focal neurological deficits. Extremities: Bilateral lower legs wounds with dressing Skin: As above Psychiatry: Flat affect  Discharge Instructions    Allergies as of 01/11/2024       Reactions   Erythromycin Rash   Zithromax [azithromycin]  Rash        Medication List     STOP taking these medications    amiodarone  200 MG tablet Commonly known as: PACERONE    aspirin  81 MG tablet   carvedilol  3.125 MG  tablet Commonly known as: COREG    [employer] VITAMIN C PO   cyanocobalamin  1000 MCG tablet Commonly known as: VITAMIN B12   Farxiga  10 MG Tabs tablet Generic drug: dapagliflozin  propanediol   furosemide  20 MG tablet Commonly known as: LASIX    hydroxyurea  500 MG capsule Commonly known as: HYDREA    ibuprofen 800 MG tablet Commonly known as: ADVIL   levothyroxine  75 MCG tablet Commonly known as: SYNTHROID    potassium chloride  10 MEQ tablet Commonly known as: KLOR-CON  M   spironolactone  25 MG tablet Commonly known as: ALDACTONE    Vitamin D  50 MCG (2000 UT) tablet   warfarin 2 MG tablet Commonly known as: COUMADIN    warfarin 5 MG tablet Commonly known as: COUMADIN        TAKE these medications    glycopyrrolate  1 MG tablet Commonly known as: ROBINUL  Take 1 tablet (1 mg total) by mouth every 4 (four) hours as needed (excessive secretions).   oxyCODONE  5 MG immediate release tablet Commonly known as: Oxy IR/ROXICODONE  Take 1 tablet (5 mg total) by mouth every 4 (four) hours as needed for moderate pain (pain score 4-6) (or dyspnea).   traZODone  50 MG tablet Commonly known as: DESYREL  Take 50 mg by mouth at bedtime.       Allergies  Allergen Reactions   Erythromycin Rash   Zithromax [Azithromycin] Rash      The results of significant diagnostics from this hospitalization (including imaging, microbiology, ancillary and laboratory) are listed below for reference.    Significant Diagnostic Studies: VAS US  LOWER EXTREMITY ARTERIAL DUPLEX Result Date: 01/04/2024 LOWER EXTREMITY ARTERIAL DUPLEX STUDY Patient Name:  TARRENCE [name]  Date of Exam:   01/04/2024 Medical Rec #: [id number]              Accession #:    [id number] Date of Birth: 13-Feb-1951              Patient Gender: M Patient Age:   73 years Exam Location:  [facility] Procedure:      VAS US  LOWER EXTREMITY ARTERIAL DUPLEX Referring Phys: DEBBY ROBERTSON  --------------------------------------------------------------------------------  Indications: Rest pain. High Risk Factors: Hypertension, past history of smoking.  Vascular Interventions: Multiple interventions including EVAR and left fem-pop                         art BPG. Current ABI:            R- N/C, L-1.42 Limitations: pt unable to tolerate any pressure near feet due to heel ulcer. Performing Technologist: Elmarie Lindau, RVT  Examination Guidelines: A complete evaluation includes B-mode imaging, spectral Doppler, color Doppler, and power Doppler as needed of all accessible portions of each vessel. Bilateral testing is considered an integral part of a complete examination. Limited examinations for reoccurring indications may be performed as noted.  +----------+--------+-----+---------------+-----------+--------+ RIGHT     PSV cm/sRatioStenosis       Waveform   Comments +----------+--------+-----+---------------+-----------+--------+ CFA Prox  102                         multiphasic         +----------+--------+-----+---------------+-----------+--------+ DFA       0                                               +----------+--------+-----+---------------+-----------+--------+  SFA Prox  260          50-74% stenosismultiphasic         +----------+--------+-----+---------------+-----------+--------+ SFA Mid   99                          multiphasic         +----------+--------+-----+---------------+-----------+--------+ SFA Distal321          50-74% stenosismultiphasic         +----------+--------+-----+---------------+-----------+--------+ POP Prox  52                          monophasic          +----------+--------+-----+---------------+-----------+--------+ POP Distal47                          monophasic          +----------+--------+-----+---------------+-----------+--------+ ATA Distal79                          monophasic           +----------+--------+-----+---------------+-----------+--------+ PTA Distal74                          monophasic          +----------+--------+-----+---------------+-----------+--------+  Left Graft #1: Left Femoral Artery to Popliteal Artery Bypass Graft +--------------------+--------+--------+---------+--------+                     PSV cm/sStenosisWaveform Comments +--------------------+--------+--------+---------+--------+ Inflow              182             triphasic         +--------------------+--------+--------+---------+--------+ Proximal Anastomosis112             triphasic         +--------------------+--------+--------+---------+--------+ Proximal Graft      92              triphasic         +--------------------+--------+--------+---------+--------+ Mid Graft           81              triphasic         +--------------------+--------+--------+---------+--------+ Distal Graft        69              triphasic         +--------------------+--------+--------+---------+--------+ Distal Anastomosis  70              triphasic         +--------------------+--------+--------+---------+--------+ Outflow             167             triphasic         +--------------------+--------+--------+---------+--------+   Summary: Right: 50-74% stenosis noted in the superficial femoral artery. There are multiple lesions throughout the superficial femoral artery. Left: Patent left femoral artery to popliteal artery bypass graft with no hemodynamically significant stenosis.  See table(s) above for measurements and observations. Electronically signed by Norman Serve on 01/04/2024 at 4:38:01 PM.    Final    VAS US  ABI WITH/WO TBI Result Date: 01/04/2024  LOWER EXTREMITY DOPPLER STUDY Patient Name:  Jermal Dismuke [facility]  Date of Exam:   01/04/2024 Medical Rec #: [id number]  Accession #:    [id number] Date of Birth: October 31, 1950              Patient Gender: M Patient  Age:   75 years Exam Location:  [facility] Procedure:      VAS US  ABI WITH/WO TBI Referring Phys: DEBBY ROBERTSON --------------------------------------------------------------------------------  Indications: Rest pain. High Risk Factors: Hypertension, past history of smoking.  Vascular Interventions: Multiple interventions including EVAR and left Fem-Pop                         art BPG. Limitations: Today's exam was limited due to patient positioning and patient              intolerant to cuff pressure. Comparison Study: 10/09/2022 Falsely elevated ABI bilaterally. Performing Technologist: Jimmye Scarce RVT  Examination Guidelines: A complete evaluation includes at minimum, Doppler waveform signals and systolic blood pressure reading at the level of bilateral brachial, anterior tibial, and posterior tibial arteries, when vessel segments are accessible. Bilateral testing is considered an integral part of a complete examination. Photoelectric Plethysmograph (PPG) waveforms and toe systolic pressure readings are included as required and additional duplex testing as needed. Limited examinations for reoccurring indications may be performed as noted.  ABI Findings: +--------+------------------+-----+----------+--------+ Right   Rt Pressure (mmHg)IndexWaveform  Comment  +--------+------------------+-----+----------+--------+ Brachial                                 IV       +--------+------------------+-----+----------+--------+ PTA     197               1.71 monophasic         +--------+------------------+-----+----------+--------+ DP      254               2.21 monophasic         +--------+------------------+-----+----------+--------+ +--------+------------------+-----+-----------+-------+ Left    Lt Pressure (mmHg)IndexWaveform   Comment +--------+------------------+-----+-----------+-------+ [id number]                                        +--------+------------------+-----+-----------+-------+ PTA     127               1.10 multiphasic        +--------+------------------+-----+-----------+-------+ DP      163               1.42 triphasic          +--------+------------------+-----+-----------+-------+ +-------+-----------+-----------+------------+------------+ ABI/TBIToday's ABIToday's TBIPrevious ABIPrevious TBI +-------+-----------+-----------+------------+------------+ Right  N/C                                            +-------+-----------+-----------+------------+------------+ Left   1.42                                           +-------+-----------+-----------+------------+------------+  Left ABI is falsely elevated.  Summary: Right: Resting right ankle-brachial index indicates noncompressible right lower extremity arteries. Left: Left ankle brachial index is falsely elevated. *See table(s) above for measurements and observations.  Electronically signed by Norman Serve on 01/04/2024 at 4:37:41 PM.  Final    US  Abdomen Complete Result Date: 01/03/2024 CLINICAL DATA:  Abnormal LFTs and right flank pain EXAM: ABDOMEN ULTRASOUND COMPLETE COMPARISON:  None Available. FINDINGS: Gallbladder: Cholecystectomy. Common bile duct: Diameter: 7 mm. Liver: No focal lesion identified. Within normal limits in parenchymal echogenicity. Portal vein is patent on color Doppler imaging with normal direction of blood flow towards the liver. IVC: No abnormality visualized. Pancreas: Limited visualization due to bowel gas. Spleen: The spleen is enlarged measuring 14.4 cm in greatest dimension. Right Kidney: Length: 11.5 cm. Echogenicity within normal limits. No mass or hydronephrosis visualized. Left Kidney: Length: 7.4 cm. Echogenicity within normal limits. No mass or hydronephrosis visualized. Abdominal aorta: Aorto bi-iliac stent. The excluded aneurysm sac measures 4.2 cm. Not meaningfully changed compared with CT 11/16/2022 given  differences in technique. Other findings: None. IMPRESSION: 1. Splenomegaly. 2. Otherwise unremarkable abdominal ultrasound. Electronically Signed   By: Norman Gatlin M.D.   On: 01/03/2024 01:23   DG CHEST PORT 1 VIEW Result Date: 01/02/2024 CLINICAL DATA:  Status post PICC line placement. EXAM: PORTABLE CHEST 1 VIEW COMPARISON:  May 17, 2020. FINDINGS: Stable cardiomediastinal silhouette. Status post coronary artery bypass graft as well as cardiac valve repair. Left-sided defibrillator is unchanged. Right-sided PICC line is noted with distal tip in expected position of the SVC. Minimal left basilar subsegmental atelectasis is noted with small pleural effusion. Right lung is clear. IMPRESSION: Interval placement of right-sided PICC line with distal tip in expected position of the SVC. Minimal left basilar subsegmental atelectasis is noted with small pleural effusion. Electronically Signed   By: Lynwood Landy Raddle M.D.   On: 01/02/2024 18:16   US  EKG SITE RITE Result Date: 01/02/2024 If Site Rite image not attached, placement could not be confirmed due to current cardiac rhythm.   Microbiology: No results found for this or any previous visit (from the past 240 hours).   Labs: Basic Metabolic Panel: Recent Labs  Lab 01/07/24 0520 01/08/24 0520 01/09/24 0534 01/10/24 0545 01/11/24 0515  NA 131* 130* 132* 128* 130*  K 3.7 4.6 3.7 3.9 4.4  CL 89* 89* 88* 88* 93*  CO2 33* 32 32 29 26  GLUCOSE 102* 126* 116* 115* 103*  BUN 51* 53* 51* 52* 51*  CREATININE 1.86* 1.80* 2.01* 1.75* 1.68*  CALCIUM  8.6* 8.7* 9.3 9.1 9.3   Liver Function Tests: Recent Labs  Lab 01/05/24 0315  AST 50*  ALT 61*  ALKPHOS 70  BILITOT 1.2  PROT 6.2*  ALBUMIN  2.2*   No results for input(s): LIPASE, AMYLASE in the last 168 hours. No results for input(s): AMMONIA in the last 168 hours. CBC: Recent Labs  Lab 01/05/24 0315 01/06/24 0500 01/07/24 0520 01/08/24 0520 01/09/24 0534  WBC 5.9 6.8 5.7 6.5  7.7  HGB 8.3* 8.2* 8.0* 8.3* 9.5*  HCT 25.5* 25.4* 25.0* 25.7* 30.1*  MCV 114.9* 114.4* 115.2* 115.2* 114.9*  PLT 405* 442* 469* 521* 632*   Cardiac Enzymes: No results for input(s): CKTOTAL, CKMB, CKMBINDEX, TROPONINI in the last 168 hours. BNP: BNP (last 3 results) Recent Labs    05/02/23 1435 08/01/23 1501 11/22/23 1240  BNP 753.7* 560.2* 1,219.5*    ProBNP (last 3 results) Recent Labs    08/29/23 1158  PROBNP 9,442*    CBG: No results for input(s): GLUCAP in the last 168 hours.     Signed:  Sigurd Pac MD.  [facility] 01/11/2024, 12:02 PM

## 2024-01-11 NOTE — TOC Transition Note (Signed)
 Transition of Care [facility]) - Discharge Note   Patient Details  Name: Taylor [name] [facility] MRN: [id number] Date of Birth: 1951-04-10  Transition of Care [facility]) CM/SW Contact:  Justina Delcia Czar, RN Phone Number: [phone] 01/11/2024, 11:23 AM   Clinical Narrative:     Consult received to arrange Residential Hospice. Spoke to pt's sister, [name] at bedside. Pt's sister states family has agreed on [facility] or [facility] which facility has a bed. Contacted [facility] rep, Melissa and referral made. Rep will review referral and update team.     Final next level of care: Hospice Medical Facility Barriers to Discharge: No Barriers Identified   Patient Goals and CMS Choice Patient states their goals for this hospitalization and ongoing recovery are:: wants to get better CMS Medicare.gov Compare Post Acute Care list provided to:: Patient Represenative (must comment) Choice offered to / list presented to : [facility] POA / Guardian      Discharge Placement                       Discharge Plan and Services Additional resources added to the After Visit Summary for   In-house Referral: Clinical Social Work Discharge Planning Services: CM Consult Post Acute Care Choice: Residential Hospice Bed                               Social Drivers of Health (SDOH) Interventions SDOH Screenings   Food Insecurity: Food Insecurity Present (01/02/2024)  Housing: Low Risk  (01/06/2024)  Transportation Needs: No Transportation Needs (01/02/2024)  Utilities: Not At Risk (01/02/2024)  Depression (PHQ2-9): Low Risk  (04/03/2022)  Financial Resource Strain: Medium Risk (09/26/2023)   Received from [facility]  Social Connections: Unknown (01/06/2024)  Tobacco Use: Medium Risk (01/06/2024)     Readmission Risk Interventions    11/14/2022   11:39 AM  Readmission Risk Prevention Plan  Transportation Screening Complete  PCP or Specialist Appt within 5-7 Days Complete  Home  Care Screening Complete  Medication Review (RN CM) Complete

## 2024-01-11 NOTE — Telephone Encounter (Signed)
 Patient remains hospitalized at [facility]

## 2024-01-11 NOTE — Progress Notes (Signed)
 Taylor [name] 4E01 - [facility] Liaison Note   Received request from [facility] for family interest in [facility]. Met with patient and family and discussed hospice philosophy including inpatient palliative care.   Patient is approved for GIP level of care at [facility] and should discharge to [facility] this afternoon/evening.   Inpatient team updated with plan.   Please do not hesitate to call with questions.   Elouise Husband, BSN, RN, [facility] H[facility]ice Liaison [phone]

## 2024-01-11 NOTE — TOC Transition Note (Signed)
 Transition of Care [facility]) - Discharge Note   Patient Details  Name: Taylor [name] [facility] MRN: [id number] Date of Birth: Apr 08, 1951  Transition [facility] Care [facility] CM/SW Contact:  Montie LOISE Louder, LCSW Phone Number: 01/11/2024, 3:36 PM   Clinical Narrative:    Patient will Discharge to: [facility]  Discharge Date: 01/11/24 Family Notified: family completed consent  Transport Ab:[facility] RN Report #  [id number]   TOC signing off   Montie Louder, MSW, LCSW Clinical Social Worker     Final next level of care: Hospice Medical Facility Barriers to Discharge: No Barriers Identified   Patient Goals and CMS Choice Patient states their goals for this hospitalization and ongoing recovery are:: wants to get better CMS Medicare.gov Compare Post Acute Care list provided to:: Patient Represenative (must comment) Choice offered to / list presented to : [facility] POA / Guardian      Discharge Placement                       Discharge Plan and Services Additional resources added to the After Visit Summary for   In-house Referral: Clinical Social Work Discharge Planning Services: CM Consult Post Acute Care Choice: Residential Hospice Bed                               Social Drivers of Health (SDOH) Interventions SDOH Screenings   Food Insecurity: Food Insecurity Present (01/02/2024)  Housing: Low Risk  (01/06/2024)  Transportation Needs: No Transportation Needs (01/02/2024)  Utilities: Not At Risk (01/02/2024)  Depression (PHQ2-9): Low Risk  (04/03/2022)  Financial Resource Strain: Medium Risk (09/26/2023)   Received from [facility]  Social Connections: Unknown (01/06/2024)  Tobacco Use: Medium Risk (01/06/2024)     Readmission Risk Interventions    11/14/2022   11:39 AM  Readmission Risk Prevention Plan  Transportation Screening Complete  PCP or Specialist Appt within 5-7 Days Complete  Home Care Screening Complete  Medication Review (RN CM) Complete

## 2024-01-11 NOTE — Progress Notes (Signed)
 Advanced Heart Failure Rounding Note  HF Cardiologist: Dr. Cherrie   Chief Complaint: acute on chronic systolic heart failure and VT  Patient Profile   Taylor [name] is a 73 y.o male with mechnical AVR on chronic coumadin , h/o VF arrest s/p ICD, CAD s/p CABG, chronic systolic HF (EF 20-25%), AAA, h/o essential thrombosis and severe PAD w/ chronic nonhealing wounds, S/p EVAR for AAA requiring bilateral common femoral endarterectomies, admitted for VT w/ ICD shocks and a/c CHF w/ volume overload. W/ FTT prior to admit and had stopped taking many of his medications.   Subjective:    Remains weak and confused. Family at bedside. Co-ox 50%. CVP 8  Objective:    Weight Range: 59 kg Body mass index is 19.2 kg/m.   Vital Signs:   Temp:  [97.6 F (36.4 C)-98.1 F (36.7 C)] 98 F (36.7 C) (08/15 0749) Pulse Rate:  [69-73] 70 (08/15 0749) Resp:  [13-18] 17 (08/15 0749) BP: (100-124)/(52-63) 100/52 (08/15 0749) SpO2:  [93 %-100 %] 96 % (08/15 0749) Weight:  [59 kg] 59 kg (08/15 0342) Last BM Date : 01/09/24  Weight change: Filed Weights   01/09/24 0500 01/10/24 0300 01/11/24 0342  Weight: 66.2 kg 58.1 kg 59 kg   Intake/Output:  Intake/Output Summary (Last 24 hours) at 01/11/2024 0909 Last data filed at 01/11/2024 0533 Gross per 24 hour  Intake 663.83 ml  Output 650 ml  Net 13.83 ml    Physical Exam   General:  Terminally ill appearing No resp difficulty HEENT: normal Neck: supple. JVP 8. Carotids 2+ bilat; no bruits. No lymphadenopathy or thryomegaly appreciated. Cor: I nondisplaced. Regular rate & rhythm. Mech s2 Lungs: clear Abdomen: soft, nontender, nondistended. No hepatosplenomegaly. No bruits or masses. Good bowel sounds. Extremities: no cyanosis, clubbing, rash, edema + LE wounds wrapped Neuro: weak lethargic but interactive  Telemetry   VP aced 70s Personally reviewed  Labs    CBC Recent Labs    01/09/24 0534  WBC 7.7  HGB 9.5*  HCT 30.1*  MCV  114.9*  PLT 632*   Basic Metabolic Panel Recent Labs    91/85/74 0545 01/11/24 0515  NA 128* 130*  K 3.9 4.4  CL 88* 93*  CO2 29 26  GLUCOSE 115* 103*  BUN 52* 51*  CREATININE 1.75* 1.68*  CALCIUM  9.1 9.3   Liver Function Tests No results for input(s): AST, ALT, ALKPHOS, BILITOT, PROT, ALBUMIN  in the last 72 hours.  BNP (last 3 results) Recent Labs    05/02/23 1435 08/01/23 1501 11/22/23 1240  BNP 753.7* 560.2* 1,219.5*   ProBNP (last 3 results) Recent Labs    08/29/23 1158  PROBNP 9,442*   Medications:    Scheduled Medications:  amiodarone   200 mg Oral BID   aspirin  EC  81 mg Oral Daily   Chlorhexidine  Gluconate Cloth  6 each Topical Daily   feeding supplement  237 mL Oral BID BM   furosemide   40 mg Oral Daily   Gerhardt's butt cream   Topical BID   hydroxyurea   500 mg Oral Daily   And   hydroxyurea   1,000 mg Oral Once per day on Monday Wednesday Friday   levothyroxine   100 mcg Oral Q0600   mexiletine  200 mg Oral Q12H   multivitamin with minerals  1 tablet Oral Daily   pantoprazole   40 mg Oral BID   potassium chloride   40 mEq Oral Daily   sodium chloride  flush  10-40 mL Intracatheter Q12H  sodium chloride  flush  3 mL Intravenous Q12H   spironolactone   25 mg Oral Daily   thiamine   100 mg Oral Daily   traZODone   50 mg Oral QHS   vitamin A   10,000 Units Oral Daily   zinc  sulfate (50mg  elemental zinc )  220 mg Oral Daily    Infusions:  heparin  1,250 Units/hr (01/10/24 2300)     PRN Medications: acetaminophen , ondansetron  (ZOFRAN ) IV, oxyCODONE , sodium chloride  flush, sodium chloride  flush, traMADol   Assessment/Plan   1. VT w/ ICD Shocks - H/o VF Arrest and H/o CHB. S/p [employer] CRT-D device, followed by EP (Dr. Fernande) - Suspect VT triggered by worsening HF w/ marked volume overload + hypokalemia  - No recurrent VT - on amio and mexilitene   2. Acute on Chronic Systolic Heart Failure - Echo (05/2016 @ [facility]): EF 40% - Longstanding EF  25-30% from 2018 to now.  - Most recent 5/25 EF 25-30%, G2DD, mildly reduced RV. - s/p CRT-D.  - Chronically NYHA III-IV mainly limited currently by LE wounds and poor mobility  - Volume looks good.Continue po lasix  40 mg daily. - GDMT limited by hypotension and CKD.  - Stopped Farxiga  with poor mobility and wound healing - Continue Spiro 25 mg daily  - Off Coreg  with marginal CO-OX - Has failed evaluation for Barostim and CCM - He is actively dying from end-stage HF.    3. CAD - h/o remote PCI in 1994 at [facility] - s/p LHC in 2017 in the setting of Vfib Arrest w/ 3V CAD treated w/ CABG x 3 - he denies angina. HS trop trend not c/w ACS  - Continue ASA  - statin intolerant, needs to be consider for PSK9i (likely of limited benefit at this point with poor prognosis) - holding ? blocker for now    4. Mechanical Aortic Valve - Stable on last echo  - On coumadin  PTA. Now on heparin    5. PAD with nonhealing lower extremity wounds and rest pain - 6/24 EVAR for AAA requiring bilateral common femoral endarterectomies.  He had to go back on 11/10/2022 for a thrombosed left common femoral artery after endarterectomy. Then went to [facility] for left fem-pop. Course c/b LLE foot ulcer - He had been trying to get in w/ [facility] due to concern for potential need for amputation and effort for limb salvage - Continues to struggle with nonhealing wounds. Has been followed at [facility] and here. - Peripheral angiography, initially aborted d/t elevated INR. Patient now wants to proceed with this. Plan for angiogram possibly tomorrow.    6. IDA - Hgb 9.0>>7.7>>8.2>>8.3 - Got 2 units PRBCs at hospital in [place].   - Ferritin 79, T sat 12% s/p IV Fe.   7. Essential thrombocytosis - On hydroxyurea   He is actively dying from end-stage HF and suffering with pain from LE wounds. No options for advanced therapies. We discussed Hospice Care and both he and his family would like to go to [facility] if possible. Will d/w  Palliative Care team   Length of Stay: 9  Toribio Fuel, MD  01/11/2024, 9:09 AM  Advanced Heart Failure Team Pager [phone] (M-F; 7a - 5p)  Please contact [facility] for night-coverage after hours (5p -7a ) and weekends on [email or web address]

## 2024-01-11 NOTE — Progress Notes (Addendum)
 Daily Progress Note   Patient Name: Taylor [name] [facility]       Date: 01/11/2024 DOB: [birth year]  Age: 73 y.o. MRN#: [id number] Attending Physician: Fairy Frames, MD Primary Care Physician: Joella Sieving, DO Admit Date: 01/02/2024  Reason for Consultation/Follow-up: Establishing goals of care  Length of Stay: 9  Current Medications: Scheduled Meds:   antiseptic oral rinse  15 mL Topical BID   Chlorhexidine  Gluconate Cloth  6 each Topical Daily   furosemide   40 mg Oral Daily   Gerhardt's butt cream   Topical BID   mexiletine  200 mg Oral Q12H   oxyCODONE   5 mg Oral Q4H   pantoprazole   40 mg Oral BID   sodium chloride  flush  10-40 mL Intracatheter Q12H   sodium chloride  flush  3 mL Intravenous Q12H   spironolactone   25 mg Oral Daily   thiamine   100 mg Oral Daily   traZODone   50 mg Oral QHS    Continuous Infusions:   PRN Meds: acetaminophen  **OR** acetaminophen , artificial tears, diphenhydrAMINE , glycopyrrolate  **OR** glycopyrrolate  **OR** glycopyrrolate , haloperidol  **OR** haloperidol  **OR** haloperidol  lactate, LORazepam  **OR** LORazepam  **OR** LORazepam , morphine  injection, ondansetron  **OR** ondansetron  (ZOFRAN ) IV, oxyCODONE , sodium chloride  flush, sodium chloride  flush  Physical Exam Vitals reviewed.  Constitutional:      General: He is not in acute distress.    Appearance: He is ill-appearing.  Cardiovascular:     Rate and Rhythm: Normal rate.  Pulmonary:     Effort: Pulmonary effort is normal.  Skin:    General: Skin is warm and dry.  Neurological:     Mental Status: He is alert.  Psychiatric:        Mood and Affect: Mood normal.        Behavior: Behavior normal.             Vital Signs: BP (!) 100/52 (BP Location: Left Arm)   Pulse 70   Temp 98 F (36.7  C) (Oral)   Resp 17   Ht 5' 9 (1.753 m)   Wt 59 kg   SpO2 96%   BMI 19.20 kg/m  SpO2: SpO2: 96 % O2 Device: O2 Device: Room Air O2 Flow Rate: O2 Flow Rate (L/min): 2 L/min       Palliative Assessment/Data: 20%      Patient Active Problem List  Diagnosis Date Noted   Cardiac defibrillator in place 01/06/2024   Acute on chronic combined systolic and diastolic CHF (congestive heart failure) (HCC) 01/05/2024   Coronary artery disease involving coronary bypass graft of native heart without angina pectoris 01/05/2024   Protein-calorie malnutrition, severe 01/03/2024   Ventricular tachycardia (HCC) 01/02/2024   AAA (abdominal aortic aneurysm) (HCC) 11/09/2022   Abdominal aortic aneurysm (AAA) greater than 5.5 cm in diameter in male [facility]) 11/09/2022   PAD (peripheral artery disease) (HCC) 11/09/2022   PVC's (premature ventricular contractions) 10/31/2021   Heart block AV complete (HCC) 10/31/2021   Atrial flutter (HCC)    Persistent atrial fibrillation (HCC) 09/29/2020   Secondary hypercoagulable state (HCC) 09/29/2020   Ischemic cardiomyopathy 08/04/2019   ICD (implantable cardioverter-defibrillator) in place 08/04/2019   Essential hypertension 06/23/2019   Sepsis (HCC) 06/23/2019   AKI (acute kidney injury) (HCC) 06/23/2019   Hyponatremia 06/23/2019   Normal anion gap metabolic acidosis 06/23/2019   Normocytic anemia 06/23/2019   Acute cholecystitis 06/22/2019   Chronic anticoagulation 02/18/2018   Leg pain, central, left 04/17/2017   Essential thrombocythemia (HCC) 02/15/2017   Encounter for therapeutic drug monitoring 02/15/2017   H/O mechanical aortic valve replacement 02/15/2017   Chronic systolic heart failure (HCC) 02/02/2017    Palliative Care Assessment & Plan   Patient Profile: 73 y.o. male  with past medical history of AoV abnormality s/p mechanical AVR (on coumadin ), h/o VF arrest in 2017 s/p CRT-D, CAD s/p CABG x3 + redo mAVR, chronic systolic HF (EF  79-74%), paroxysmal AF, AAA s/p EVAR with b/l fem endarterectomies c/b thrombosis of L fem and foot ulcer, essential thrombosis, ETOH, cocaine, and tobacco abuse admitted on 01/02/2024 with ICD shocks/VT.    Patient initially presented to [facility] in [place].  Transferred here and was also found to have acute on chronic systolic CHF.  Acute on chronic anemia.  Electrolyte disturbances, concerns with his chronic wounds secondary to PAD and consideration of angiogram.   PMT has been consulted to assist with goals of care conversation.  Today's Discussion: Reviewed patient chart and received updates from nursing. Dr. Cherrie saw patient this morning and patient and sister are interested in hospice care. Patient sitting up in bed with his sister and RN at bedside. Patient is in no apparent distress. He ate one to two bites of his breakfast.  He reports he is doing pretty good considering.   Patient's sister [name] (HCPOA) and patient confirm they would like to discuss hospice and steps moving forward. Discussed that once the patient transitions to comfort measures they would no longer receive aggressive medical interventions such as continuous vital signs, lab work, radiology testing, or medications not focused on comfort. All care would focus on how the patient is looking and feeling. This would include management of any symptoms that may cause discomfort, pain, shortness of breath, cough, nausea, agitation, anxiety, and/or secretions etc. Symptoms would be managed with medications and other non-pharmacological interventions.  Patient and sister would like to transition to comfort measures.  We agreed to start scheduled oxycodone  with available as needed pain medications.  Discussed hospice philosophy. I explained that acceptance to hospice in any specific location is the final decision of the hospice medical director and bed availability, if applicable. Patient's sister prefers either hospice  facility in [place] or [place]-- preference for whichever has availability first.   Emotional support and therapeutic listening provided. Encouraged patient's sister to call PMT with any questions or concerns. PMT will continue to  follow.  1 pm: Went by patient room to discuss turning off ICD as part of the comfort process. Patient and sister agreeable to this.  2 pm: Patient approved for [facility]. Bed available today.  Recommendations/Plan: Changed to comfort focused care TOC order placed for inpatient hospice- preference for placement in [place] ([place]) or [place] [facility]) Continued PMT support   Symptom Management: Scheduled Oxycodone  5 mg every 4 hours Oxycodone  or Morphine  PRN for pain/air hunger/comfort Robinul  PRN for excessive secretions Ativan  PRN for agitation/anxiety Zofran  PRN for nausea Liquifilm tears PRN for dry eyes Haldol  PRN for agitation/anxiety May have comfort feeding Comfort cart for family Unrestricted visitations in the setting of EOL (per policy) Oxygen PRN 2L or less for comfort. No escalation.      Code Status:    Code Status Orders  (From admission, onward)           Start     Ordered   01/11/24 1029  Do not attempt resuscitation (DNR) - Comfort care  Continuous       Question Answer Comment  If patient has no pulse and is not breathing Do Not Attempt Resuscitation   In Pre-Arrest Conditions (Patient Is Breathing and Has a Pulse) Provide comfort measures. Relieve any mechanical airway obstruction. Avoid transfer unless required for comfort.   Consent: Discussion documented in EHR or advanced directives reviewed      01/11/24 1031         Extensive chart review has been completed prior to seeing the patient including labs, vital signs, imaging, progress/consult notes, orders, medications, and available advance directive documents.  Care plan was discussed with bedside RNs, Dr. Fairy, and TOC  Time spent: 65  minutes  Thank you for allowing the Palliative Medicine Team to assist in the care of this patient.    Stephane CHRISTELLA Palin, NP  Please contact Palliative Medicine Team phone at [phone] for questions and concerns.

## 2024-01-11 NOTE — TOC Transition Note (Addendum)
 Transition of Care [facility]) - Discharge Note   Patient Details  Name: Taylor [name] [facility] MRN: [id number] Date of Birth: [birth year]  Transition [facility]) CM/SW Contact:  Justina Delcia Czar, RN Phone Number: [phone] 01/11/2024, 3:37 PM   Clinical Narrative:     Received confirmation from [facility] rep, Shawn that pt was accepted and bed available today. Family has signed consents. Contacted [facility] for transportation to [facility].  Unit RN please call report [phone] prior to transport. Also, please leave IV access intact per [facility] request.    Final next level of care: Hospice Medical Facility Barriers to Discharge: No Barriers Identified   Patient Goals and CMS Choice Patient states their goals for this hospitalization and ongoing recovery are:: wants to get better CMS Medicare.gov Compare Post Acute Care list provided to:: Patient Represenative (must comment) Choice offered to / list presented to : [name] POA / Guardian      Discharge Placement                       Discharge Plan and Services Additional resources added to the After Visit Summary for   In-house Referral: Clinical Social Work Discharge Planning Services: CM Consult Post Acute Care Choice: Residential Hospice Bed                               Social Drivers of Health (SDOH) Interventions SDOH Screenings   Food Insecurity: Food Insecurity Present (01/02/2024)  Housing: Low Risk  (01/06/2024)  Transportation Needs: No Transportation Needs (01/02/2024)  Utilities: Not At Risk (01/02/2024)  Depression (PHQ2-9): Low Risk  (04/03/2022)  Financial Resource Strain: Medium Risk (09/26/2023)   Received from [facility]  Social Connections: Unknown (01/06/2024)  Tobacco Use: Medium Risk (01/06/2024)     Readmission Risk Interventions    11/14/2022   11:39 AM  Readmission Risk Prevention Plan  Transportation Screening Complete  PCP or Specialist Appt within 5-7 Days  Complete  Home Care Screening Complete  Medication Review (RN CM) Complete

## 2024-01-11 NOTE — TOC Progression Note (Signed)
 Transition of Care [facility]) - Progression Note    Patient Details  Name: Taylor [name] MRN: [id number] Date of Birth: Mar 28, 1951  [facility]) CM/SW Contact  Justina Delcia Czar, RN Phone Number: [phone] 01/11/2024, 10:07 AM  Clinical Narrative:    Spoke to pt at bedside. Gave permission to speak to sister. Contacted sister and states pt lives close to her but his children wants him to be at a facility in [place]. She will discuss with his children for SNF rehab vs Residential Hospice. Waiting Palliative team to meet with patient and family for GOC. Pt lives alone and will need 24 hour care.    Expected Discharge Plan: Skilled Nursing Facility Barriers to Discharge: Continued Medical Work up               Expected Discharge Plan and Services In-house Referral: Clinical Social Work Discharge Planning Services: CM Consult Post Acute Care Choice: Home Health Living arrangements for the past 2 months: Single Family Home                                       Social Drivers of Health (SDOH) Interventions SDOH Screenings   Food Insecurity: Food Insecurity Present (01/02/2024)  Housing: Low Risk  (01/06/2024)  Transportation Needs: No Transportation Needs (01/02/2024)  Utilities: Not At Risk (01/02/2024)  Depression (PHQ2-9): Low Risk  (04/03/2022)  Financial Resource Strain: Medium Risk (09/26/2023)   Received from [facility]  Social Connections: Unknown (01/06/2024)  Tobacco Use: Medium Risk (01/06/2024)    Readmission Risk Interventions    11/14/2022   11:39 AM  Readmission Risk Prevention Plan  Transportation Screening Complete  PCP or Specialist Appt within 5-7 Days Complete  Home Care Screening Complete  Medication Review (RN CM) Complete

## 2024-01-11 NOTE — Progress Notes (Signed)
 OT Cancellation Note  Patient Details Name: Taylor [name] MRN: [id number] DOB: May 20, 1951   Cancelled Treatment:     OT to hold providing care until after palliative discussion. Pt has had severe pain with oob attempts and want to limit pain if not desiring rehabilitation services with potential d/c to hospice care.   Taylor [name] 01/11/2024, 9:23 AM

## 2024-01-11 NOTE — Telephone Encounter (Signed)
-----   Message from Daphne Barrack sent at 01/11/2024  3:35 PM EDT ----- Regarding: Comfort Care Patient elected for comfort care while inpatient > ICD therapies turned of as well as device alerts.      Daphne Barrack, NP-C, AGACNP-BC [facility] - Electrophysiology  01/11/2024, 3:35 PM

## 2024-01-11 NOTE — Progress Notes (Signed)
 Per order ICD manufacturer [employer] has been called regarding comfort measures and a representative has been paged to come to the Pts room and turn off the device.

## 2024-01-14 ENCOUNTER — Telehealth (HOSPITAL_COMMUNITY): Payer: Self-pay | Admitting: *Deleted

## 2024-01-14 NOTE — Telephone Encounter (Signed)
 Received fax from [facility], pt passed away 19-Jan-2024 at 1:50 PM

## 2024-01-28 DEATH — deceased

## 2024-02-01 ENCOUNTER — Encounter: Admitting: Student
